# Patient Record
Sex: Male | Born: 1950
Health system: Southern US, Community
[De-identification: ages and names within clinical notes are randomized; demographics above are authoritative.]

## PROBLEM LIST (undated history)

## (undated) DIAGNOSIS — Z973 Presence of spectacles and contact lenses: Secondary | ICD-10-CM

## (undated) DIAGNOSIS — G8929 Other chronic pain: Secondary | ICD-10-CM

## (undated) DIAGNOSIS — H9193 Unspecified hearing loss, bilateral: Secondary | ICD-10-CM

## (undated) DIAGNOSIS — G4730 Sleep apnea, unspecified: Secondary | ICD-10-CM

## (undated) DIAGNOSIS — M545 Low back pain, unspecified: Secondary | ICD-10-CM

## (undated) DIAGNOSIS — I480 Paroxysmal atrial fibrillation: Secondary | ICD-10-CM

## (undated) DIAGNOSIS — I219 Acute myocardial infarction, unspecified: Secondary | ICD-10-CM

## (undated) DIAGNOSIS — T7840XA Allergy, unspecified, initial encounter: Secondary | ICD-10-CM

## (undated) DIAGNOSIS — T8859XA Other complications of anesthesia, initial encounter: Secondary | ICD-10-CM

## (undated) DIAGNOSIS — N400 Enlarged prostate without lower urinary tract symptoms: Secondary | ICD-10-CM

## (undated) DIAGNOSIS — H409 Unspecified glaucoma: Secondary | ICD-10-CM

## (undated) DIAGNOSIS — M1990 Unspecified osteoarthritis, unspecified site: Secondary | ICD-10-CM

## (undated) DIAGNOSIS — K649 Unspecified hemorrhoids: Secondary | ICD-10-CM

## (undated) DIAGNOSIS — Z8719 Personal history of other diseases of the digestive system: Secondary | ICD-10-CM

## (undated) DIAGNOSIS — Z87442 Personal history of urinary calculi: Secondary | ICD-10-CM

## (undated) DIAGNOSIS — C801 Malignant (primary) neoplasm, unspecified: Secondary | ICD-10-CM

## (undated) DIAGNOSIS — I10 Essential (primary) hypertension: Secondary | ICD-10-CM

## (undated) DIAGNOSIS — Z9889 Other specified postprocedural states: Secondary | ICD-10-CM

## (undated) DIAGNOSIS — K5190 Ulcerative colitis, unspecified, without complications: Secondary | ICD-10-CM

## (undated) DIAGNOSIS — T4145XA Adverse effect of unspecified anesthetic, initial encounter: Secondary | ICD-10-CM

## (undated) DIAGNOSIS — M549 Dorsalgia, unspecified: Secondary | ICD-10-CM

## (undated) DIAGNOSIS — F32A Depression, unspecified: Secondary | ICD-10-CM

## (undated) DIAGNOSIS — K635 Polyp of colon: Secondary | ICD-10-CM

## (undated) DIAGNOSIS — K5790 Diverticulosis of intestine, part unspecified, without perforation or abscess without bleeding: Secondary | ICD-10-CM

## (undated) DIAGNOSIS — Z974 Presence of external hearing-aid: Secondary | ICD-10-CM

## (undated) DIAGNOSIS — M961 Postlaminectomy syndrome, not elsewhere classified: Secondary | ICD-10-CM

## (undated) DIAGNOSIS — R112 Nausea with vomiting, unspecified: Secondary | ICD-10-CM

## (undated) DIAGNOSIS — F329 Major depressive disorder, single episode, unspecified: Secondary | ICD-10-CM

## (undated) DIAGNOSIS — I82409 Acute embolism and thrombosis of unspecified deep veins of unspecified lower extremity: Secondary | ICD-10-CM

## (undated) DIAGNOSIS — IMO0002 Reserved for concepts with insufficient information to code with codable children: Secondary | ICD-10-CM

## (undated) DIAGNOSIS — H269 Unspecified cataract: Secondary | ICD-10-CM

## (undated) DIAGNOSIS — E785 Hyperlipidemia, unspecified: Secondary | ICD-10-CM

## (undated) DIAGNOSIS — Z5189 Encounter for other specified aftercare: Secondary | ICD-10-CM

## (undated) HISTORY — PX: HERNIA REPAIR: SHX51

## (undated) HISTORY — PX: JOINT REPLACEMENT: SHX530

## (undated) HISTORY — DX: Malignant (primary) neoplasm, unspecified: C80.1

## (undated) HISTORY — DX: Postlaminectomy syndrome, not elsewhere classified: M96.1

## (undated) HISTORY — PX: EYE SURGERY: SHX253

## (undated) HISTORY — DX: Encounter for other specified aftercare: Z51.89

## (undated) HISTORY — DX: Hyperlipidemia, unspecified: E78.5

## (undated) HISTORY — DX: Reserved for concepts with insufficient information to code with codable children: IMO0002

## (undated) HISTORY — DX: Polyp of colon: K63.5

## (undated) HISTORY — DX: Allergy, unspecified, initial encounter: T78.40XA

## (undated) HISTORY — DX: Acute myocardial infarction, unspecified: I21.9

---
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
## 1991-08-04 HISTORY — PX: APPENDECTOMY: SHX54

## 1992-08-03 HISTORY — PX: CHOLECYSTECTOMY: SHX55

## 1994-08-03 DIAGNOSIS — I82409 Acute embolism and thrombosis of unspecified deep veins of unspecified lower extremity: Secondary | ICD-10-CM

## 1994-08-03 HISTORY — DX: Acute embolism and thrombosis of unspecified deep veins of unspecified lower extremity: I82.409

## 2000-12-04 ENCOUNTER — Encounter: Payer: Self-pay | Admitting: Emergency Medicine

## 2000-12-04 ENCOUNTER — Emergency Department (HOSPITAL_COMMUNITY): Admission: EM | Admit: 2000-12-04 | Discharge: 2000-12-04 | Payer: Self-pay | Admitting: Emergency Medicine

## 2001-02-02 ENCOUNTER — Encounter: Payer: Self-pay | Admitting: Orthopedic Surgery

## 2001-02-09 ENCOUNTER — Inpatient Hospital Stay (HOSPITAL_COMMUNITY): Admission: RE | Admit: 2001-02-09 | Discharge: 2001-02-12 | Payer: Self-pay | Admitting: Orthopedic Surgery

## 2001-03-30 ENCOUNTER — Observation Stay (HOSPITAL_COMMUNITY): Admission: RE | Admit: 2001-03-30 | Discharge: 2001-03-31 | Payer: Self-pay | Admitting: Orthopedic Surgery

## 2002-07-09 ENCOUNTER — Encounter: Payer: Self-pay | Admitting: Orthopedic Surgery

## 2002-07-10 ENCOUNTER — Inpatient Hospital Stay (HOSPITAL_COMMUNITY): Admission: EM | Admit: 2002-07-10 | Discharge: 2002-07-18 | Payer: Self-pay | Admitting: Emergency Medicine

## 2002-08-02 ENCOUNTER — Ambulatory Visit (HOSPITAL_COMMUNITY): Admission: RE | Admit: 2002-08-02 | Discharge: 2002-08-02 | Payer: Self-pay | Admitting: Orthopedic Surgery

## 2002-08-22 ENCOUNTER — Emergency Department (HOSPITAL_COMMUNITY): Admission: EM | Admit: 2002-08-22 | Discharge: 2002-08-22 | Payer: Self-pay | Admitting: Emergency Medicine

## 2002-12-26 ENCOUNTER — Encounter: Payer: Self-pay | Admitting: Gastroenterology

## 2002-12-26 ENCOUNTER — Ambulatory Visit (HOSPITAL_COMMUNITY): Admission: RE | Admit: 2002-12-26 | Discharge: 2002-12-26 | Payer: Self-pay | Admitting: Gastroenterology

## 2003-06-17 ENCOUNTER — Emergency Department (HOSPITAL_COMMUNITY): Admission: EM | Admit: 2003-06-17 | Discharge: 2003-06-17 | Payer: Self-pay | Admitting: Emergency Medicine

## 2003-12-28 ENCOUNTER — Encounter: Admission: RE | Admit: 2003-12-28 | Discharge: 2004-02-13 | Payer: Self-pay | Admitting: Family Medicine

## 2004-03-03 ENCOUNTER — Encounter: Admission: RE | Admit: 2004-03-03 | Discharge: 2004-03-03 | Payer: Self-pay | Admitting: Specialist

## 2004-05-01 ENCOUNTER — Ambulatory Visit (HOSPITAL_COMMUNITY): Admission: RE | Admit: 2004-05-01 | Discharge: 2004-05-02 | Payer: Self-pay | Admitting: Specialist

## 2005-07-15 ENCOUNTER — Ambulatory Visit: Payer: Self-pay | Admitting: Gastroenterology

## 2005-07-17 ENCOUNTER — Ambulatory Visit: Payer: Self-pay | Admitting: Gastroenterology

## 2005-07-17 ENCOUNTER — Ambulatory Visit (HOSPITAL_COMMUNITY): Admission: RE | Admit: 2005-07-17 | Discharge: 2005-07-17 | Payer: Self-pay | Admitting: Gastroenterology

## 2005-07-22 ENCOUNTER — Ambulatory Visit: Payer: Self-pay | Admitting: Gastroenterology

## 2005-08-05 ENCOUNTER — Ambulatory Visit: Payer: Self-pay | Admitting: Gastroenterology

## 2005-08-19 ENCOUNTER — Ambulatory Visit: Payer: Self-pay | Admitting: Gastroenterology

## 2005-08-24 ENCOUNTER — Ambulatory Visit (HOSPITAL_COMMUNITY): Admission: RE | Admit: 2005-08-24 | Discharge: 2005-08-24 | Payer: Self-pay | Admitting: Gastroenterology

## 2005-09-03 ENCOUNTER — Ambulatory Visit: Payer: Self-pay | Admitting: Cardiology

## 2005-09-10 ENCOUNTER — Ambulatory Visit: Payer: Self-pay

## 2005-09-18 ENCOUNTER — Encounter: Admission: RE | Admit: 2005-09-18 | Discharge: 2005-09-18 | Payer: Self-pay | Admitting: Surgery

## 2005-09-29 ENCOUNTER — Encounter: Admission: RE | Admit: 2005-09-29 | Discharge: 2005-12-28 | Payer: Self-pay | Admitting: Surgery

## 2005-10-19 ENCOUNTER — Encounter: Admission: RE | Admit: 2005-10-19 | Discharge: 2005-10-19 | Payer: Self-pay | Admitting: Surgery

## 2005-11-30 ENCOUNTER — Other Ambulatory Visit: Admission: RE | Admit: 2005-11-30 | Discharge: 2005-11-30 | Payer: Self-pay | Admitting: Otolaryngology

## 2005-12-08 ENCOUNTER — Ambulatory Visit (HOSPITAL_COMMUNITY): Admission: RE | Admit: 2005-12-08 | Discharge: 2005-12-09 | Payer: Self-pay | Admitting: Surgery

## 2005-12-08 ENCOUNTER — Encounter (INDEPENDENT_AMBULATORY_CARE_PROVIDER_SITE_OTHER): Payer: Self-pay | Admitting: *Deleted

## 2006-01-20 ENCOUNTER — Encounter (INDEPENDENT_AMBULATORY_CARE_PROVIDER_SITE_OTHER): Payer: Self-pay | Admitting: *Deleted

## 2006-01-20 ENCOUNTER — Ambulatory Visit (HOSPITAL_COMMUNITY): Admission: RE | Admit: 2006-01-20 | Discharge: 2006-01-20 | Payer: Self-pay | Admitting: Otolaryngology

## 2006-08-03 HISTORY — PX: BACK SURGERY: SHX140

## 2007-03-14 ENCOUNTER — Ambulatory Visit: Payer: Self-pay | Admitting: Gastroenterology

## 2007-03-25 ENCOUNTER — Ambulatory Visit: Payer: Self-pay | Admitting: Gastroenterology

## 2007-08-04 HISTORY — PX: SPINE SURGERY: SHX786

## 2007-09-29 ENCOUNTER — Ambulatory Visit (HOSPITAL_COMMUNITY)
Admission: RE | Admit: 2007-09-29 | Discharge: 2007-09-29 | Payer: Self-pay | Admitting: Physical Medicine and Rehabilitation

## 2007-12-23 ENCOUNTER — Ambulatory Visit: Payer: Self-pay | Admitting: Gastroenterology

## 2007-12-23 DIAGNOSIS — R1013 Epigastric pain: Secondary | ICD-10-CM | POA: Insufficient documentation

## 2007-12-23 DIAGNOSIS — R1031 Right lower quadrant pain: Secondary | ICD-10-CM | POA: Insufficient documentation

## 2007-12-23 DIAGNOSIS — Z860100 Personal history of colon polyps, unspecified: Secondary | ICD-10-CM | POA: Insufficient documentation

## 2007-12-23 DIAGNOSIS — K219 Gastro-esophageal reflux disease without esophagitis: Secondary | ICD-10-CM

## 2007-12-27 ENCOUNTER — Ambulatory Visit: Payer: Self-pay | Admitting: Gastroenterology

## 2007-12-27 LAB — CONVERTED CEMR LAB
Albumin: 4.3 g/dL (ref 3.5–5.2)
BUN: 16 mg/dL (ref 6–23)
Basophils Absolute: 0 10*3/uL (ref 0.0–0.1)
Basophils Relative: 0.5 % (ref 0.0–1.0)
CO2: 29 meq/L (ref 19–32)
Calcium: 9.3 mg/dL (ref 8.4–10.5)
Chloride: 104 meq/L (ref 96–112)
Creatinine, Ser: 1.1 mg/dL (ref 0.4–1.5)
GFR calc Af Amer: 88 mL/min
GFR calc non Af Amer: 73 mL/min
Glucose, Bld: 100 mg/dL — ABNORMAL HIGH (ref 70–99)
HCT: 46.8 % (ref 39.0–52.0)
Lymphocytes Relative: 23.4 % (ref 12.0–46.0)
MCHC: 34.6 g/dL (ref 30.0–36.0)
MCV: 92.6 fL (ref 78.0–100.0)
Monocytes Absolute: 0.5 10*3/uL (ref 0.1–1.0)
Monocytes Relative: 7.8 % (ref 3.0–12.0)
Neutrophils Relative %: 63.5 % (ref 43.0–77.0)
Platelets: 184 10*3/uL (ref 150–400)

## 2007-12-28 ENCOUNTER — Ambulatory Visit: Payer: Self-pay | Admitting: Cardiology

## 2007-12-28 LAB — CONVERTED CEMR LAB: Tissue Transglutaminase Ab, IgA: 0.3 units (ref <7)

## 2008-02-07 ENCOUNTER — Telehealth (INDEPENDENT_AMBULATORY_CARE_PROVIDER_SITE_OTHER): Payer: Self-pay | Admitting: *Deleted

## 2008-02-24 ENCOUNTER — Encounter: Payer: Self-pay | Admitting: Pulmonary Disease

## 2009-03-15 ENCOUNTER — Encounter: Admission: RE | Admit: 2009-03-15 | Discharge: 2009-03-15 | Payer: Self-pay | Admitting: Otolaryngology

## 2009-04-27 ENCOUNTER — Encounter: Admission: RE | Admit: 2009-04-27 | Discharge: 2009-04-27 | Payer: Self-pay | Admitting: Orthopedic Surgery

## 2009-06-06 ENCOUNTER — Inpatient Hospital Stay (HOSPITAL_COMMUNITY): Admission: RE | Admit: 2009-06-06 | Discharge: 2009-06-07 | Payer: Self-pay | Admitting: Orthopedic Surgery

## 2009-09-10 ENCOUNTER — Encounter: Admission: RE | Admit: 2009-09-10 | Discharge: 2009-09-10 | Payer: Self-pay | Admitting: Orthopedic Surgery

## 2009-10-04 ENCOUNTER — Encounter: Admission: RE | Admit: 2009-10-04 | Discharge: 2009-10-04 | Payer: Self-pay | Admitting: Family Medicine

## 2009-12-17 ENCOUNTER — Encounter: Admission: RE | Admit: 2009-12-17 | Discharge: 2009-12-17 | Payer: Self-pay | Admitting: Diagnostic Neuroimaging

## 2010-08-03 DIAGNOSIS — E785 Hyperlipidemia, unspecified: Secondary | ICD-10-CM

## 2010-08-03 HISTORY — DX: Hyperlipidemia, unspecified: E78.5

## 2010-08-24 ENCOUNTER — Encounter: Payer: Self-pay | Admitting: Family Medicine

## 2010-11-05 LAB — BASIC METABOLIC PANEL
BUN: 13 mg/dL (ref 6–23)
Calcium: 9.2 mg/dL (ref 8.4–10.5)
GFR calc non Af Amer: >60 mL/min (ref >60)
Glucose, Bld: 94 mg/dL (ref 70–99)
Potassium: 4 mEq/L (ref 3.5–5.1)
Sodium: 140 mEq/L (ref 135–145)

## 2010-11-05 LAB — CBC
Hemoglobin: 15.8 g/dL (ref 13.0–17.0)
Platelets: 171 10*3/uL (ref 150–400)
RDW: 12.7 % (ref 11.5–15.5)
WBC: 6.3 10*3/uL (ref 4.0–10.5)

## 2010-12-19 NOTE — Op Note (Signed)
[facility]  Patient:    [name], [name]                         MRN: [id number] Proc. Date: 02/09/01 Adm. Date:  [id number] Disc. Date: [id number] Attending:  Wynona Meals K                           Operative Report  PREOPERATIVE DIAGNOSIS:  Osteoarthritis of the left knee.  POSTOPERATIVE DIAGNOSIS:  Osteoarthritis of the left knee.  PROCEDURE:  Left total knee arthroplasty.  SURGEON:  Gaynelle Arabian, M.D.  ASSISTANT:  Jenetta Loges, P.A.  ANESTHESIA:  Spinal.  ESTIMATED BLOOD LOSS:  Minimal.  DRAINS:  Hemovac x 1.  COMPLICATIONS:  None.  TOURNIQUET TIME:  54 minutes at 350 mmHg.  CONDITION:  Stable to recovery.  BRIEF CLINICAL NOTE:  [name] is a 60 year old male with severe osteoarthritis of the left knee with pain refractory to nonoperative management including injections. He had a previous right total knee arthroplasty approximately five to six years ago by Dr. Collie Siad with good results. He presents now for left total knee arthroplasty.  DESCRIPTION OF PROCEDURE:  After successful administration of spinal anesthetic, a tourniquet was placed high on the left thigh and left lower extremity prepped and draped in the usual sterile fashion. The extremity was marked in Esmarch, knee flexed, and tourniquet inflated to 350 mmHg. A standard midline incision was made and skin cut with a 10 blade through subcutaneous tissue to the level of the extensor mechanism. A fresh blade was used to make a medial parapatellar arthrotomy. The soft tissue over the proximal and medial tibia subperiosteally elevated to the joint line with a knife into the semimembranosus bursa with a curved osteotome. The soft tissue over the proximal lateral tibia was also elevated with attention being paid to avoid the patellar tendon on tibial tubercle. The lateral patellar femoral ligaments cut, patella everted, knee flexed to 90 degrees. The ACL and PCL were removed. A drill was  used to create a starting hole in the distal femur and then the canal was irrigated. Five degree left valgus alignment guide was placed and referencing off the posterior condyles rotations marked in a block pin so as to remove 9 mm off the distal femur. Distal femoral resection was made with an oscillating saw. A sizing block is placed and a size 5 is most appropriate. Rotation corresponds with the epicondylar axis. The size 5 cutting block is placed and the anterior and posterior cuts then made.  The tibia is subluxed forward and the extramedullary tibial alignment guide placed. Referencing proximally at the medial third tibial tubercle and distally along the second metatarsal axis of the tibial crest, this block is pinned so as to remove 10 mm of the nondeficient lateral side. ______ resection is then made with an oscillating saw. A size 5 is the most appropriate tibia. Tibial preparation is then performed with the keel punch and modular drill.  The chamfer block is then placed on the femur and a condylar chamfer cut is made. A size 5 posterior stabilized femur is placed. A 10 mm posterior stabilized rotating platform insert is placed and the knee is then put into full extension with excellent varus valgus balance down past 100 degrees of flexion. The patella was then everted, thickness measured to be 26.5 mm and free hand resection was taken down to approximately 15 mm. The template is  placed and the lug holes are drilled for a 41 mm patella. The patella is placed and it tracks normally.  The tibial and patellar trials are removed. A laminar spreader was placed to give access to the posterior aspect of the joint and then the posterior osteophytes are removed off the femur with a trial in place. The trial is then removed. Cut bone surfaces are repaired with pulsatile lavage and the cement mixed. Once ready for implantation, the size 5 posterior stabilized femur, rotating platform  tibial tray, and the 41 mm patella cemented into place and the patella held with a clamp. A 10 mm trial insert is placed, knee held in full extension, and all extruded cement removed. Once the cement was fully hardened and the trial insertion moved then the permanent 10 mm posterior stabilized rotating platform insert is placed into the tibial tray. The wound is then copiously irrigated and the extensor mechanism closed over a Hemovac drain with interrupted #1 PDS. The tourniquet is released with a total time of 54 minutes. The subcu is closed with interrupted 2-0 Vicryl, subcuticular with running 4-0 monocryl. The incision was cleaned and dried and Steri-Strips and a bulky sterile dressing applied. The patient was then awakened and transported to recovery in stable condition. DD:  02/09/01 TD:  02/09/01 Job: [id number]

## 2010-12-19 NOTE — Op Note (Signed)
NAME:  [name], [name] NO.:  [id number]   MEDICAL RECORD NO.:  [id number]          PATIENT TYPE:  OIB   LOCATION:  40                         FACILITY:  [facility]   PHYSICIAN:  Isabel Caprice. Hassell Done, MD  DATE OF BIRTH:  1950/10/19   DATE OF PROCEDURE:  12/08/2005  DATE OF DISCHARGE:                                 OPERATIVE REPORT   CCS NUMBER:  [id number].   PREOPERATIVE DIAGNOSES:  1.  Right upper quadrant pain,  2.  Biliary dyskinesia.   POSTOPERATIVE DIAGNOSES:  1.  Right upper quadrant pain,  2.  Biliary dyskinesia.   PREOPERATIVE INDICATIONS:  [name] is a 60 year old white male whom I  had done a laparoscopic Nissen in 1995 which radiographically is intact and  doing well.  The patient has been having some upper abdominal pain and was  found to have perhaps evidence of biliary dyskinesia and was referred for  cholecystectomy.  Prior to this, we did have him lose about 25 pounds which  he has done.  Since the last time I saw [name], he has developed a  palpable mass in his left neck which has been worked by Melissa Montane, and  biopsy suggests a pleomorphic adenoma, and further workup will be done.  In  the meantime, because of his biliary dyskinesia, he will have a laparoscopic  cholecystectomy today.   SURGEON:  Dr. [name]   ASSISTANT:  Dr. Fanny Skates   ANESTHESIA:  General endotracheal.   DESCRIPTION OF PROCEDURE:  The patient was taken to room 1 on Dec 08, 2005,  and given general anesthesia.  The abdomen was prepped with chlorhexidine  and draped sterilely.  A longitudinal incision was made down into the  umbilicus, and I entered the previous lap Nissen portal without difficulty.  The abdomen was insufflated.  Three trocars were placed in the upper abdomen  after infiltrating the areas with Marcaine.  The gallbladder was elevated  and was seen to be adherent to surrounding fatty tissue which would indicate  some evidence of chronic  cholecystitis.  This was stripped away, and then  the cystic duct and cystic artery were dissected free.  Once these were  skeletonized and the anatomy delineated, I put a clip upon the gallbladder  side and clips on the cystic artery, incised the cystic duct, and put in a  Reddick catheter through which I did a dynamic cholangiogram which showed  prompt filling of a normal-appearing common bile duct with intrahepatic  filling and then with free flow into the duodenum.  The cystic duct was then  triple clipped, divided, and the cystic artery was divided, and then the  gallbladder was removed from the gallbladder bed with a hook electrocautery  without entering the gallbladder and controlling bleeding up in the fundus  near in the midway up in the gallbladder where there was a perforating  vessel.  This was double clipped as I removed the gallbladder.  No bleeding  or bile leaks were seen.  The gallbladder was placed in a bag and brought  out through the  umbilicus.  Reinspected the gallbladder bed, and no bleeding  was noted.  No bile leaks were noted.  The umbilical defect was repaired  with 2 sutures of 0 Vicryl placed in a simple fashion.  This was done under  laparoscopic vision.  I then deflated the abdomen, infiltrated the remaining  wounds with Marcaine, and closed then with 4-0 Vicryl, Benzoin, and Steri-  Strips.  The patient seemed to tolerate the procedure well.  He was taken to  the recovery room in satisfactory condition.   In looking around the abdomen, I looked at his liver; I looked at his  stomach, looked through the small bowel, did not see any evidence of any  occult masses or anything that would suggest foregut tumor that had  metastasized to the neck.  The patient will get back with Dr. Janace Hoard for  completion workup of the head and neck mass.      Isabel Caprice Hassell Done, MD  Electronically Signed     MBM/MEDQ  D:  12/08/2005  T:  12/08/2005  Job:  [id number]   cc:    Lennette Bihari L. Little, M.D.  Fax: [phone]   Clarene Reamer, M.D. [facility]  [address]  [place]  [place] [address]   Melissa Montane, M.D.  Fax: [phone]

## 2010-12-19 NOTE — H&P (Signed)
[facility]  Patient:    [name], [name]                         MRN: [id number] Adm. Date:  02/09/01 Attending:  Gaynelle Arabian, M.D. Dictator:   Alexzandrew L. Perkins, P.A.-C.                         History and Physical  DATE OF OFFICE VISIT AND HISTORY AND PHYSICAL:  January 27, 2001  CHIEF COMPLAINT:  Left knee pain.  HISTORY OF PRESENT ILLNESS:  The patient is a 60 year old white male who has previously undergone right total knee replacement in the past and has done quite well. However, he presents for a reevaluation of his left knee pain. Left knee pain has been going on for some time now. He was seen back by Mikeal Hawthorne A. Doran Durand., M.D. back in 1997 and has been followed and treated in the past conservatively for his left knee. He has been on different medications and even anti-inflammatories to include Motrin. However, this did upset his stomach. As stated before, he has undergone right total knee replacement and arthroplasty and has done quite well. The left knee pain has progressed to where it is interfering with his daily activities and felt he would benefit for a total knee replacement. Risks and benefits discussed, and he has elected to proceed with surgery.  ALLERGIES:  No known drug allergies.  CURRENT MEDICATIONS: 1. Zyrtec one a day. 2. Multivitamin daily.  PAST MEDICAL HISTORY:  Carpal tunnel syndrome.  PAST SURGICAL HISTORY:  Nissen fundoplication and also right total knee and appendectomy.  SOCIAL HISTORY:  He is married. Has one child. Three to four beers a night of alcohol intake. Nonsmoker.  FAMILY HISTORY:  Mother history of PE and coronary artery disease. Father deceased with history of prostate cancer.  REVIEW OF SYSTEMS:  GENERAL:  No fevers, chills, or night sweats. NEUROLOGICAL:  No seizures, syncope, or paralysis. RESPIRATORY:  No shortness of breath, productive cough, or hemoptysis. CARDIOVASCULAR:  No chest  pain, angina, or orthopnea. GI:  No nausea, vomiting, diarrhea, or constipation. No blood or mucus in the stool. GU:  No discharge, dysuria, or hematuria. MUSCULOSKELETAL: Pertinent to that found in the history of present illness.  PHYSICAL EXAMINATION:  GENERAL:  The patient is a 60 year old, white male, well-nourished, well-developed appears to be in no acute distress. Alert, oriented, and cooperative.  VITAL SIGNS:  Blood pressure 118/72, pulse is 64, respirations 16.  HEENT:  Normocephalic, atraumatic. Pupils are round and reactive. EOMs are intact.  NECK:  Supple. No lymphadenopathy or thyromegaly appreciated.  CHEST:  No wheezing, rhonchi, or rales. Chest is clear anterior/posterior.  HEART:  Regular rate and rhythm. No murmurs. S1, S2 noted.  ABDOMEN:  Soft, nontender. No rebound or guarding.  GENITALIA/RECTAL/BREASTS:  Not done. Not pertinent to present illness.  EXTREMITIES:  Significant to the left lower extremity. He has range of motion of the left knee from 15 to 90 degrees and is swollen, tender over the medial and lateral joint line. Neurovascularly intact.  LABORATORY DATA:  X-rays show end-stage osteoarthritis of the left knee with tricompartment arthritis.  IMPRESSION:  End-stage osteoarthritis left knee.  PLAN:  The patient will be admitted to [facility] to undergo a left total knee replacement and arthroplasty. Surgery will be performed by Gaynelle Arabian, M.D. DD:  01/28/01 TD:  01/28/01 Job: [id number]

## 2010-12-19 NOTE — Op Note (Signed)
NAME:  [name], [name] NO.:  [id number]   MEDICAL RECORD NO.:  [id number]          PATIENT TYPE:  OIB   LOCATION:  5038                         FACILITY:  [facility]   PHYSICIAN:  Jessy Oto, M.D.   DATE OF BIRTH:  17-Feb-1951   DATE OF PROCEDURE:  05/01/2004  DATE OF DISCHARGE:                                 OPERATIVE REPORT   PREOPERATIVE DIAGNOSIS:  Extraforaminal lateral herniated nucleus pulposus,  left L1-2, with calcified spur.   POSTOPERATIVE DIAGNOSIS:  Calcified herniated nucleus pulposus, left L1-2,  extraforaminal, lateral to the foramen and L1 nerve root.   PROCEDURES:  1.  Left L1-2 extraforaminal approach to microdiskectomy of the lateral      herniated nucleus pulposus.  2.  Decompression of the left L1 nerve root.   SURGEON:  Jessy Oto, M.D.   ASSISTANT:  Epimenio Foot, P.A.   ANESTHESIA:  GOT, Crissie Sickles. Conrad Bemidji, M.D.   ESTIMATED BLOOD LOSS:  50 mL.   DRAINS:  None.   BRIEF CLINICAL HISTORY:  A 60 year old male presented with severe back pain  and radiation to the left leg.  The pain has been unrelenting over the last  two to three months.  He has undergone attempts at selective nerve root  blocks over the left side at L2-3 and L3-4, unsuccessful in relieving his  pain.  Also found to have disk protrusion lateral to L1-2 level here.  Finally a selective block was successful in relieving his pain and  discomfort.  He was brought to the operating room to undergo excision of the  lateral disk protrusion, left-sided L1-2, that is an extraforaminal region  lateral to the foramen, affecting primarily the L1 nerve root.  The  patient's relief of pain was only one to two hours following the selective  root block, suggesting that only local anesthesia was of much benefit here.   INTRAOPERATIVE FINDINGS:  Calcified spur and disk off the lateral aspect of  the posterior portion of the L1-2 disk space, affecting the left L1 nerve  root,  causing nerve root compression along the anterior ventral aspect of  the L1 nerve root.   DESCRIPTION OF PROCEDURE:  After adequate general anesthesia, the patient in  the prone position using the Wilson frame.  He has bilateral total knee  arthroplasties so that an [place] frame was not used, a Tree surgeon table was  used.  Standard prep with Duraprep solution after padding all the pressure  points well.  Note that a preoperative consent form was signed after  informed consent and the side on which the surgery was to be performed was  marked preoperatively.  Standard prep with Duraprep solution, draped in the  usual manner, iodine Vi-Drape used.  Prior to placement of the iodine Vi-  Drape, spinal needles placed at the expected L1 and L2 levels.  These were  found to be present at the L2 and the L3 levels so that incision was made an  inch and a half to the left side of the midline, sagittally oriented,  through the skin and subcutaneous  layers a total of 3-1/2 inches in length  through the skin and subcu layers, down to the lumbar dorsal fascia  overlying the paralumbar muscles on the left side.  The incision then made  through the superficial fascia after infiltration with Marcaine 0.5% and  1:200,000 epinephrine.  Blunt dissection then through the muscle fibers to  the transverse process, and this was able to be palpated at the L2, L1  levels.  A McCullough retractor inserted and then a Penfield 4 used to  carefully penetrate the intertransverse membrane and a second intraoperative  lateral radiograph obtained, demonstrating the Penfield 4 to be over the  transverse process of L2, pointing toward the L1-2 disk.  The second  McCullough retractor was inserted, obtaining further exposure at the region  of the left-side paralumbar region and the intertransverse region.  The  intertransverse ligament was resected off the superior aspect of the  transverse process of L2 and the intertransverse  muscle carefully retracted  laterally until the L1 nerve root identified.  The operating room microscope  draped, sterilely brought into the field, and under direct visualization  then the L1 nerve roots carefully freed off the undersurface, where it was  overlying the spur and calcified disk over the posterolateral aspect of the  left L1-2 disk space.  D'Errico retractor then used to carefully retract the  L1 nerve root medially so that the spur and disk material laterally was  easily identified.  A 15 blade scalpel then used to incise the disk over the  posterolateral corner, pituitaries were used to excise the disk material.  A  large spur remaining over the posterior superior aspect of the L2 vertebral  body over the posterolateral corner of the disk space.  This was excised  using an osteophyte-cutting rongeur.  After resecting then this posterior  corner, intraoperative radiographs obtained demonstrating the Penfield 4  within the disk at the L1-2 level and in the region of the expected spur and  disk material that was excised.  Following this a hockey stick neurologic  probe could be passed ventral and anterior to the L1 nerve root within the  neural foramen, demonstrating its freedom, as well as posterior to the nerve  root.  With this, then, irrigation was performed.  Thrombin-soaked Gelfoam  used to obtain hemostasis. Once this was completed, the Gelfoam was removed.  No significant electrocautery was carried out deep to the intertransverse  membrane during this period of time.  Following irrigation, then the soft  tissue was allowed to fall back into place, including the paralumbar  muscles.  The superficial fascial layer then approximated with interrupted 0  Vicryl sutures.  The more superficial layers with interrupted 0 and then 2-0  Vicryl suture, and the skin closed with a running subcu stitch of 4-0  Vicryl.  Tincture of Benzoin and Steri-Strips applied, a Coverlet  dressing applied.  The patient then reactivated following return to the supine  position, extubated, and returned to the recovery room in satisfactory  condition.  All instrument and sponge counts were correct.       JEN/MEDQ  D:  05/01/2004  T:  05/02/2004  Job:  [id number]

## 2010-12-19 NOTE — Discharge Summary (Signed)
[facility]  Patient:    [name], [name]                       MRN: [id number] Adm. Date:  [id number] Disc. Date: 02/12/01 Attending:  [name] Dictator:   Elodia Florence. Clabe Seal, P.A.                           Discharge Summary  ADMISSION DIAGNOSIS:  End-stage osteoarthritis of left knee.  DISCHARGE DIAGNOSES: 1. End-stage osteoarthritis of left knee. 2. Mild postoperative anemia.  OPERATIONS:  On February 09, 2001, the patient underwent left total knee replacement arthroplasty.  Jenetta Loges, P.A., assisted.  CONSULTS:  None.  BRIEF HISTORY:  This is a 60 year old male with severe osteoarthritis of both knees.  He had previously undergone a right total knee arthroplasty about five or six years ago and has done very good with those results.  His pain now in the left knee is refractory to non-operative management, including injections to the knee.  He is a very active, relatively young gentleman and it was felt that he would benefit from surgical intervention.  He was admitted for the above procedure.  COURSE IN THE HOSPITAL:  The patient tolerated the surgical procedure quite well and entered into the total knee protocol with minimal to no difficulty. He progressed very nicely with the CPM machine, as well as the general protocol.  Neurovascular remained intact to the left lower extremity.  He was ambulating in the hall with minimal difficulty.  His previous total knee certainly helped him with his postoperative course concerning his left knee.  He was voiding.  The wound was dry.  He had achieved 60+ degrees on CPM.  He felt that he could do at home much what he was doing in the hospital, so it was decided that we could discharge him home to resume home health physical therapy.  He is to return to see Gaynelle Arabian, M.D., on March 01, 2001.  LABORATORY VALUES IN THE HOSPITAL:  Hematology showed a CBC with differential essentially within  normal limits.  Blood chemistry was also normal.  He did have a very mild postoperative anemia.  The chest x-ray showed no active disease.  The electrocardiogram showed a normal sinus rhythm.  CONDITION ON DISCHARGE:  Improved and stable.  PLAN:  The patient was discharged to his home.  He is to continue with home health by [place].  To continue with Coumadin protocol for about two weeks. He will return to see Gaynelle Arabian, M.D., on March 01, 2001.  DISCHARGE MEDICATIONS: 1. Coumadin per pharmacy. 2. Tylox, #50, one to two q.4-6h. p.r.n. pain. 3. Robaxin 500 mg, #40 with a refill, one q.6h. p.r.n. muscle spasm. 4. Use Tylenol p.r.n. 5. He is to continue with any home medications. 6. Continue with incentive spirometer at home.  DIET:  He is to continue with home diet. DD:  02/12/01 TD:  02/12/01 Job: [id number]

## 2010-12-19 NOTE — Discharge Summary (Signed)
   NAME:  [name], [name] NO.:  [id number]   MEDICAL RECORD NO.:  [id number]                   PATIENT TYPE:  INP   LOCATION:  1025                                 FACILITY:  [facility]   PHYSICIAN:  Harriette Bouillon, M.D.              DATE OF BIRTH:  02/11/1951   DATE OF ADMISSION:  07/09/2002  DATE OF DISCHARGE:  07/18/2002                                 DISCHARGE SUMMARY   DISCHARGE DIAGNOSES:  1. Deep vein thrombosis of the right lower extremity involving just above     the knee downward.  2. Status post bilateral knee replacement.  3. Non-specified injury to the right lower extremity.   HISTORY OF PRESENT ILLNESS AND HOSPITAL COURSE:  The patient was admitted  with right leg pain and swelling with orthopaedic consultation on admission  initially because he was unable to bear weight.  Initial evaluation with a  Doppler ultrasound did show a deep vein thrombosis.  The patient had been  started on heparin, and initially started on Coumadin therapy.  He would  continue to receive supportive care, including pain medications as well as  topical heat application to his leg.  He gradually improved over the course  of the week.  Unfortunately, it took a week's time for his INR to become to  an acceptable level so that he could be discharged to home.  There were no  significant complications during hospitalization.  He had no evidence of  pulmonary embolism.   DISCHARGE MEDICATIONS:  1. Coumadin 12.5 mg p.o. q.d.  2. Darvocet-N 100 one or two q.4-6h. p.r.n.   CONDITION ON DISCHARGE:  He was discharged in improve condition with no  significant swelling or erythema in his leg.  He still had some mild pain.   FOLLOWUP:  He is to follow up with Dr. Leonie Douglas one week after discharge.                                               Harriette Bouillon, M.D.    RM/MEDQ  D:  07/21/2002  T:  07/22/2002  Job:  [id number]

## 2010-12-19 NOTE — Op Note (Signed)
[facility]  Patient:    BURRIS, MATHERNE Visit Number: [id number] MRN: [id number]          Service Type: SUR Location: 4W [id number] 01 Attending Physician:  [name] Proc. Date: 03/30/01 Adm. Date:  03/30/2001                             Operative Report  PREOPERATIVE DIAGNOSIS:  Arthrofibrosis, left total knee.  POSTOPERATIVE DIAGNOSIS:  Arthrofibrosis, left total knee.  PROCEDURE:  Closed manipulation, left knee.  SURGEON:  Gaynelle Arabian, M.D.  ASSISTANT:  None.  ANESTHESIA:  General.  COMPLICATIONS:  None.  CONDITION:  Stable to recovery.  BRIEF CLINICAL NOTE:  Emily is a 60 year old male who had a left total knee done approximately seven weeks ago. He has developed arthrofibrosis, range of motion only 10-80. He presents now for closed manipulation.  DESCRIPTION OF PROCEDURE:  After successful administration of general anesthetic, the patients range of motion is tested and is 10-80. A flexion force is applied with my chest on his proximal tibia. I was able to audibly hear the adhesions lyse and achieve flexion of 120 degrees. I then put an extension force on and I was able to get within 5 degrees of full extension. Against gravity he was flexed to 115 and then with the extra push 120. At this point, he was awakened and transported to recovery in stable condition. Attending Physician:  [name] DD:  03/30/01 TD:  03/30/01 Job: [id number]

## 2010-12-19 NOTE — Op Note (Signed)
NAME:  [name], BUECHE                ACCOUNT NO.:  [id number]   MEDICAL RECORD NO.:  [id number]          PATIENT TYPE:  AMB   LOCATION:  SDS                          FACILITY:  [facility]   PHYSICIAN:  Melissa Montane, M.D.       DATE OF BIRTH:  February 19, 1951   DATE OF PROCEDURE:  01/20/2006  DATE OF DISCHARGE:                                 OPERATIVE REPORT   PREOPERATIVE DIAGNOSES:  Left neck mass.   POSTOPERATIVE DIAGNOSIS:  Left neck mass.   SURGICAL PROCEDURES:  Excision of left neck mass.   ANESTHESIA:  General.   ESTIMATED BLOOD LOSS:  Less than 5 mL.   INDICATIONS:  This is a 60 year old who has had a mass in his lower neck  that has been stable.  It had a fine-needle aspiration performed which was  consistent with a pleomorphic adenoma.  This is certainly inconsistent with  suspected pathology.  The CT scan did not show any obvious masses in his  parotid gland.  He now needs to have an excision for a definitive diagnosis.  He was informed of the risk and benefits of this procedure including  bleeding, infection, scarring, numbness of the skin, weakness of the  shoulder from spinal accessory nerve injury, and risk of the anesthetic.  All questions were answered and consent was obtained.   OPERATION:  The patient was taken to the operating room and placed in the  supine position after adequate general endotracheal tube anesthesia.  He was  placed in the right gaze position, and prepped and draped in usual sterile  manner.  An incision was made over this mass in a skin crease.  A 15-blade  was used to open the skin.  Dissection was carried down through the platysma  muscle.  The mass was immediately encountered.  The capsule was carefully  dissected staying on top of the mass and it was removed without difficulty.  The mass was sent as a permanent, fresh specimen.  The wound was irrigated  with saline.  The wound was closed with interrupted 4-0 chromic and a  running 5-0 nylon.  The  patient was awakened and brought to recovery in  stable condition.  Counts were correct.           ______________________________  Melissa Montane, M.D.     JB/MEDQ  D:  01/20/2006  T:  01/20/2006  Job:  [id number]   cc:   Lennette Bihari L. Little, M.D.  Fax: [phone]

## 2011-09-25 ENCOUNTER — Other Ambulatory Visit: Payer: Self-pay | Admitting: Urology

## 2011-10-05 ENCOUNTER — Encounter (HOSPITAL_COMMUNITY): Payer: Self-pay | Admitting: Pharmacy Technician

## 2011-10-06 ENCOUNTER — Encounter (HOSPITAL_COMMUNITY): Payer: Self-pay

## 2011-10-06 ENCOUNTER — Encounter (HOSPITAL_COMMUNITY)
Admission: RE | Admit: 2011-10-06 | Discharge: 2011-10-06 | Disposition: A | Discharge status: Home / Self Care | Payer: Medicare Other | Source: Ambulatory Visit | Attending: Urology | Admitting: Urology

## 2011-10-06 HISTORY — DX: Dorsalgia, unspecified: M54.9

## 2011-10-06 HISTORY — DX: Unspecified osteoarthritis, unspecified site: M19.90

## 2011-10-06 HISTORY — DX: Benign prostatic hyperplasia without lower urinary tract symptoms: N40.0

## 2011-10-06 HISTORY — DX: Sleep apnea, unspecified: G47.30

## 2011-10-06 HISTORY — DX: Other complications of anesthesia, initial encounter: T88.59XA

## 2011-10-06 HISTORY — DX: Other chronic pain: G89.29

## 2011-10-06 HISTORY — DX: Acute embolism and thrombosis of unspecified deep veins of unspecified lower extremity: I82.409

## 2011-10-06 HISTORY — DX: Adverse effect of unspecified anesthetic, initial encounter: T41.45XA

## 2011-10-06 LAB — BASIC METABOLIC PANEL
GFR calc Af Amer: >90 mL/min (ref >90)
GFR calc non Af Amer: 89 mL/min — ABNORMAL LOW (ref >90)
Potassium: 4 mEq/L (ref 3.5–5.1)
Sodium: 138 mEq/L (ref 135–145)

## 2011-10-06 LAB — SURGICAL PCR SCREEN
MRSA, PCR: NEGATIVE
Staphylococcus aureus: NEGATIVE

## 2011-10-06 LAB — CBC
Hemoglobin: 15 g/dL (ref 13.0–17.0)
MCHC: 35.7 g/dL (ref 30.0–36.0)
RDW: 12.7 % (ref 11.5–15.5)
WBC: 5.4 10*3/uL (ref 4.0–10.5)

## 2011-10-06 NOTE — Patient Instructions (Signed)
YOUR SURGERY IS SCHEDULED ON:   Friday 3/8  AT  9:05 AM  REPORT TO [facility] AT:  7:00 AM      PHONE # FOR SHORT STAY IS [phone]  DO NOT EAT OR DRINK ANYTHING AFTER MIDNIGHT THE NIGHT BEFORE YOUR SURGERY.  YOU MAY BRUSH YOUR TEETH, RINSE OUT YOUR MOUTH--BUT NO WATER, NO FOOD, NO CHEWING GUM, NO MINTS, NO CANDIES, NO CHEWING TOBACCO.  PLEASE TAKE THE FOLLOWING MEDICATIONS THE AM OF YOUR SURGERY WITH A FEW SIPS OF WATER:    ZYRTEC, ZOLOFT, TAMSULOSIN.    MAY WEAR FENTANYL PATCH  AND USE FLONASE    IF YOU USE INHALERS--USE YOUR INHALERS THE AM OF YOUR SURGERY AND BRING INHALERS TO [place].    IF YOU ARE DIABETIC:  DO NOT TAKE ANY DIABETIC MEDICATIONS THE AM OF YOUR SURGERY.  IF YOU TAKE INSULIN IN THE EVENINGS--PLEASE ONLY TAKE 1/2 NORMAL EVENING DOSE THE NIGHT BEFORE YOUR SURGERY.  NO INSULIN THE AM OF YOUR SURGERY.  IF YOU HAVE SLEEP APNEA AND USE CPAP OR BIPAP--PLEASE BRING THE MASK --NOT THE MACHINE-NOT THE TUBING   -JUST THE MASK. DO NOT BRING VALUABLES, MONEY, CREDIT CARDS.  CONTACT LENS, DENTURES / PARTIALS, GLASSES SHOULD NOT BE WORN TO SURGERY AND IN MOST CASES-HEARING AIDS WILL NEED TO BE REMOVED.  BRING YOUR GLASSES CASE, ANY EQUIPMENT NEEDED FOR YOUR CONTACT LENS. FOR PATIENTS ADMITTED TO THE HOSPITAL--CHECK OUT TIME THE DAY OF DISCHARGE IS 11:00 AM.  ALL INPATIENT ROOMS ARE PRIVATE - WITH BATHROOM, TELEPHONE, TELEVISION AND WIFI INTERNET. IF YOU ARE BEING DISCHARGED THE SAME DAY OF YOUR SURGERY--YOU CAN NOT DRIVE YOURSELF HOME--AND SHOULD NOT GO HOME ALONE BY TAXI OR BUS.  NO DRIVING OR OPERATING MACHINERY FOR 24 HOURS FOLLOWING ANESTHESIA / PAIN MEDICATIONS.                            SPECIAL INSTRUCTIONS:  CHLORHEXIDINE SOAP SHOWER (other brand names are Betasept and Hibiclens ) PLEASE SHOWER WITH CHLORHEXIDINE THE NIGHT BEFORE YOUR SURGERY AND THE AM OF YOUR SURGERY. DO NOT USE CHLORHEXIDINE ON YOUR FACE OR PRIVATE AREAS--YOU MAY USE  YOUR NORMAL SOAP THOSE AREAS AND YOUR NORMAL SHAMPOO.  WOMEN SHOULD AVOID SHAVING UNDER ARMS AND SHAVING LEGS 57 HOURS BEFORE USING CHLORHEXIDINE TO AVOID SKIN IRRITATION.  DO NOT USE IF ALLERGIC TO CHLORHEXIDINE.  PLEASE READ OVER ANY  FACT SHEETS THAT YOU WERE GIVEN: MRSA INFORMATION, BLOOD TRANSFUSION INFORMATION, INCENTIVE SPIROMETER INFORMATION.

## 2011-10-06 NOTE — Pre-Procedure Instructions (Signed)
CBC AND BMET WERE DONE TODAY PREOP --EKG AND CXR NOT NEEDED PREOP-PER ANESTHESIOLOGIST'S GUIDELINES.

## 2011-10-08 NOTE — H&P (Addendum)
History of Present Illness               61 yo f/u BPH and elevated PSA.   PSA of 4.7 to on March 2012. His PSA was 3.05 in March of 2010. PSA in 2007 was 1.86. His PCA3 was "negative" 01-16-2011 (20%). His father died of prostate cancer.    He has been on Flomax for several years and now is on generic tamsulosin. He has had increased voiding symptoms with more frequency and nocturia. He was put on finasteride Mar 2012 but he did not start. He also had dysuria, right inguinal and scrotal discomfort Mar 2012.  He has past history of hypogonadism but is off treatment now. Recent testosterone level was low-normal. He has low energy and low libido.  He has some continued problems with erectile dysfunction. He takes Viagra with fair result.   Interval Hx He continues to have sudden urgency. He will go to void and will have a weak stream which can be furstrating to him. He doesn't feel like he empties his bladder. Most bothersome is urgency. He underwent UDS and follows-up today to review.   January 2013 urodynamics: Patient had normal storage with an obstructive flow pattern.He did not have the urge to void and was catheterized for 350 mL. Bladder capacity was 456 mL with very mild instability and no leakage. There was no stress incontinence. He voided 293 mL with a Q. max of 14 mL per second and a detrusor pressure of 63 cm per water. Max detrusor pressure of 74 cm of water. Post void 186 mL.  We discussed the nature risk and benefits of cystoscopy and elects to proceed.   Past Medical History Problems  1. History of  Adult Sleep Apnea 780.57 2. History of  Arthritis V13.4 3. History of  Coagulation Defects 286.9 4. History of  Esophageal Reflux 530.81 5. History of  Pulmonary Embolism V12.55  Surgical History Problems  1. History of  Appendectomy 2. History of  Back Surgery 3. History of  Gallbladder Surgery 4. History of  Gastric Surgery 5. History of  Knee Replacement 6. History of   Wrist Surgery  Current Meds 1. Aspirin 81 MG Oral Tablet; Therapy: (Recorded:04Apr2012) to 2. CPAP; Therapy: (Recorded:04Apr2012) to 3. FentaNYL 25 MCG/HR Transdermal Patch 72 Hour; Therapy: 26Oct2011 to 4. Finasteride 5 MG Oral Tablet; Therapy: 26Mar2012 to 5. Fish Oil CAPS; Therapy: (Recorded:04Apr2012) to 6. Fluticasone Propionate 50 MCG/ACT Nasal Suspension; Therapy: [id number] to 7. Lotemax 0.5 % Ophthalmic Suspension; Therapy: 24Jan2012 to 8. Multivitamins TABS; Therapy: (Recorded:04Apr2012) to 9. Sertraline HCl 50 MG Oral Tablet; Therapy: 23Sep2011 to 10. Tamsulosin HCl 0.4 MG Oral Capsule; Therapy: [id number] to 11. ZyrTEC Allergy 10 MG Oral Tablet; Therapy: (Recorded:04Apr2012) to  Allergies Medication  1. No Known Drug Allergies  Family History Problems  1. Paternal history of  Death In The Family Father father passed @ age 77prostate and bone cancer 2. Paternal history of  Diabetes Mellitus V18.0 3. Sororal history of  Diabetes Mellitus V18.0 4. Family history of  Family Health Status Number Of Children 1 daughter 5. Family history of  Family Health Status Of Child 1 - Deceased 6. Maternal history of  Heart Disease 7. Paternal history of  Nephrolithiasis 8. Paternal history of  Prostate Cancer V16.42 9. Maternal history of  Thromboembolic Disease  Social History Problems  1. Alcohol Use 2-3 beers per week 2. Caffeine Use three 3. Marital History - Single 4. History of  Tobacco Use  V15.82 quit 22 years ago; smoked one pack per day for 16 years  Review of Systems  Gastrointestinal: no nausea, no vomiting, no diarrhea and no constipation.  Constitutional: no fever and no recent weight loss.  Cardiovascular: no chest pain.  Respiratory: no shortness of breath.    Physical Exam Constitutional: Well nourished and well developed . No acute distress.  Pulmonary: No respiratory distress and normal respiratory rhythm and effort.  Cardiovascular: Heart rate and  rhythm are normal . No peripheral edema.  Neuro/Psych:. Mood and affect are appropriate.    Results/Data Urine [Data Includes: Last 1 Day]   21Feb2013  COLOR YELLOW   APPEARANCE CLEAR   SPECIFIC GRAVITY 1.010   pH 5.5   GLUCOSE NEG mg/dL  BILIRUBIN NEG   KETONE NEG mg/dL  BLOOD TRACE   PROTEIN NEG mg/dL  UROBILINOGEN 0.2 mg/dL  NITRITE NEG   LEUKOCYTE ESTERASE NEG   SQUAMOUS EPITHELIAL/HPF NONE SEEN   WBC NONE SEEN WBC/hpf  RBC 0-3 RBC/hpf  BACTERIA NONE SEEN   CRYSTALS NONE SEEN   CASTS NONE SEEN    Procedure  Procedure: Cystoscopy  Indication: Lower Urinary Tract Symptoms.  Informed Consent: Risks, benefits, and potential adverse events were discussed and informed consent was obtained from the patient.  Prep: The patient was prepped with betadine.  Procedure Note:  Urethral meatus:. No abnormalities.  Anterior urethra: No abnormalities.  Prostatic urethra: No abnormalities . The lateral and median prostatic lobes were enlarged. An enlarged intravesical median lobe was visualized.  Bladder: Visulization was clear. The ureteral orifices were in the normal anatomic position bilaterally and had clear efflux of urine. A systematic survey of the bladder demonstrated no bladder tumors or stones. The mucosa was smooth without abnormalities. The patient tolerated the procedure well.  Complications: None.    Assessment Assessed  1. Benign Prostatic Hypertrophy With Urinary Obstruction 600.01 2. Feelings Of Urinary Urgency 788.63  Plan Benign Prostatic Hypertrophy With Urinary Obstruction (600.01), Feelings Of Urinary Urgency (788.63)  1. Cysto  Done: 21Feb2013 Health Maintenance (V70.0)  2. UA With REFLEX  Done: [id number] 09:50AM  Discussion/Summary        I discussed with the patient and his urodynamic findings. He has mixed storage and flow symptoms. Using the prostate problems handout we discussed the nature risks and benefits of continuing alpha blockers alone or with  the addition of a 5 alpha reductase inhibitor. We discussed daily Cialis. We discussed surgical therapy for the prostate with both office or hospital-based procedures including microwave therapy, resection of the prostate or vaporization of the prostate. We therapy primarily directed urgency include behavioral therapy, physical therapy, anticholinergics and beta 3 agonist, or neuromodulation. I'd be hesitant to use medical therapy as he appears obstructed and has elevated postvoid residuals.   We discussed he had a large intravesical median lobe on cystoscopy with 100% visual occlusion. We discussed it nature risks benefits and alternatives to transurethral resection vaporization of the prostate. We discussed risks of bleeding, infection, incontinence, strictures, fluid overload, thrombotic events, anesthetic complications, worsening/de novo urgency, and sexual dysfunction (impotence/retrograde ejaculation) among others. We talked about the 85 to 90 percent success rate with flow symptoms. He understands that overactive bladder symptoms could settle down as well in most cases, but persist or worsen. We discussed treatment goals may not be reached and/or may require multiple procedures. We discussed the likelihood of achieving the goals of the procedure and potential problems that might occur during the procedure or recuperation. All questions answered.  Patient elects to proceed with transurethral resection vaporization of the prostate which we will use the bipolar.   H&P update - pt seen and examined today. No change in H&P.

## 2011-10-09 ENCOUNTER — Encounter (HOSPITAL_COMMUNITY)
Admission: RE | Disposition: A | Discharge status: Home / Self Care | Payer: Self-pay | Source: Ambulatory Visit | Attending: Urology

## 2011-10-09 ENCOUNTER — Encounter (HOSPITAL_COMMUNITY): Payer: Self-pay | Admitting: *Deleted

## 2011-10-09 ENCOUNTER — Ambulatory Visit (HOSPITAL_COMMUNITY)
Admission: RE | Admit: 2011-10-09 | Discharge: 2011-10-11 | Disposition: A | Discharge status: Home / Self Care | Payer: Medicare Other | Source: Ambulatory Visit | Attending: Urology | Admitting: Urology

## 2011-10-09 ENCOUNTER — Encounter (HOSPITAL_COMMUNITY): Payer: Self-pay | Admitting: Anesthesiology

## 2011-10-09 ENCOUNTER — Ambulatory Visit (HOSPITAL_COMMUNITY): Payer: Medicare Other | Admitting: Anesthesiology

## 2011-10-09 DIAGNOSIS — Z86711 Personal history of pulmonary embolism: Secondary | ICD-10-CM | POA: Insufficient documentation

## 2011-10-09 DIAGNOSIS — Z8739 Personal history of other diseases of the musculoskeletal system and connective tissue: Secondary | ICD-10-CM | POA: Insufficient documentation

## 2011-10-09 DIAGNOSIS — Z01812 Encounter for preprocedural laboratory examination: Secondary | ICD-10-CM | POA: Insufficient documentation

## 2011-10-09 DIAGNOSIS — G4730 Sleep apnea, unspecified: Secondary | ICD-10-CM | POA: Insufficient documentation

## 2011-10-09 DIAGNOSIS — N401 Enlarged prostate with lower urinary tract symptoms: Secondary | ICD-10-CM | POA: Insufficient documentation

## 2011-10-09 DIAGNOSIS — D689 Coagulation defect, unspecified: Secondary | ICD-10-CM | POA: Insufficient documentation

## 2011-10-09 DIAGNOSIS — N320 Bladder-neck obstruction: Secondary | ICD-10-CM | POA: Insufficient documentation

## 2011-10-09 DIAGNOSIS — N138 Other obstructive and reflux uropathy: Secondary | ICD-10-CM | POA: Insufficient documentation

## 2011-10-09 DIAGNOSIS — Z7982 Long term (current) use of aspirin: Secondary | ICD-10-CM | POA: Insufficient documentation

## 2011-10-09 DIAGNOSIS — K219 Gastro-esophageal reflux disease without esophagitis: Secondary | ICD-10-CM | POA: Insufficient documentation

## 2011-10-09 DIAGNOSIS — N400 Enlarged prostate without lower urinary tract symptoms: Secondary | ICD-10-CM

## 2011-10-09 DIAGNOSIS — Z79899 Other long term (current) drug therapy: Secondary | ICD-10-CM | POA: Insufficient documentation

## 2011-10-09 DIAGNOSIS — R3915 Urgency of urination: Secondary | ICD-10-CM | POA: Insufficient documentation

## 2011-10-09 HISTORY — PX: TRANSURETHRAL RESECTION OF PROSTATE: SHX73

## 2011-10-09 SURGERY — TRANSURETHRAL RESECTION OF THE PROSTATE WITH GYRUS INSTRUMENTS
Anesthesia: General | Site: Bladder | Wound class: Clean Contaminated

## 2011-10-09 MED ORDER — SODIUM CHLORIDE 0.9 % IJ SOLN
3.0000 mL | Freq: Two times a day (BID) | INTRAMUSCULAR | Status: DC
  Administered 2011-10-10 (×2): 3 mL via INTRAVENOUS

## 2011-10-09 MED ORDER — LORATADINE 10 MG PO TABS
10.0000 mg | ORAL_TABLET | Freq: Every day | ORAL | Status: DC
  Administered 2011-10-10 – 2011-10-11 (×2): 10 mg via ORAL
  Filled 2011-10-09 (×2): qty 1

## 2011-10-09 MED ORDER — MIDAZOLAM HCL 5 MG/5ML IJ SOLN
INTRAMUSCULAR | Status: DC | PRN
  Administered 2011-10-09 (×2): 0.5 mg via INTRAVENOUS
  Administered 2011-10-09: 1 mg via INTRAVENOUS

## 2011-10-09 MED ORDER — DOCUSATE SODIUM 100 MG PO CAPS
100.0000 mg | ORAL_CAPSULE | Freq: Two times a day (BID) | ORAL | Status: DC
  Administered 2011-10-09 – 2011-10-11 (×4): 100 mg via ORAL
  Filled 2011-10-09 (×5): qty 1

## 2011-10-09 MED ORDER — SODIUM CHLORIDE 0.9 % IR SOLN
Status: DC | PRN
  Administered 2011-10-09: 18000 mL

## 2011-10-09 MED ORDER — CEFAZOLIN SODIUM 1-5 GM-% IV SOLN
INTRAVENOUS | Status: DC | PRN
  Administered 2011-10-09: 2 g via INTRAVENOUS

## 2011-10-09 MED ORDER — OXYCODONE-ACETAMINOPHEN 5-325 MG PO TABS: 1.0000 | ORAL_TABLET | Freq: Four times a day (QID) | ORAL | Status: DC | PRN

## 2011-10-09 MED ORDER — TAMSULOSIN HCL 0.4 MG PO CAPS
0.4000 mg | ORAL_CAPSULE | Freq: Every day | ORAL | Status: DC
  Administered 2011-10-10 – 2011-10-11 (×2): 0.4 mg via ORAL
  Filled 2011-10-09 (×2): qty 1

## 2011-10-09 MED ORDER — CEFAZOLIN SODIUM-DEXTROSE 2-3 GM-% IV SOLR: 2.0000 g | Freq: Once | INTRAVENOUS | Status: DC

## 2011-10-09 MED ORDER — POLYVINYL ALCOHOL 1.4 % OP SOLN
1.0000 [drp] | Freq: Two times a day (BID) | OPHTHALMIC | Status: DC
  Administered 2011-10-09 – 2011-10-11 (×4): 1 [drp] via OPHTHALMIC
  Filled 2011-10-09 (×2): qty 15

## 2011-10-09 MED ORDER — ONDANSETRON HCL 4 MG/2ML IJ SOLN
INTRAMUSCULAR | Status: DC | PRN
  Administered 2011-10-09 (×2): 2 mg via INTRAVENOUS

## 2011-10-09 MED ORDER — ACETAMINOPHEN 10 MG/ML IV SOLN
INTRAVENOUS | Status: AC
  Filled 2011-10-09: qty 100

## 2011-10-09 MED ORDER — ACETAMINOPHEN 10 MG/ML IV SOLN
INTRAVENOUS | Status: DC | PRN
  Administered 2011-10-09: 1000 mg via INTRAVENOUS

## 2011-10-09 MED ORDER — CEFAZOLIN SODIUM-DEXTROSE 2-3 GM-% IV SOLR
INTRAVENOUS | Status: AC
  Filled 2011-10-09: qty 50

## 2011-10-09 MED ORDER — SODIUM CHLORIDE 0.9 % IV SOLN
250.0000 mL | INTRAVENOUS | Status: DC
  Administered 2011-10-09: 250 mL via INTRAVENOUS

## 2011-10-09 MED ORDER — EPHEDRINE SULFATE 50 MG/ML IJ SOLN
INTRAMUSCULAR | Status: DC | PRN
  Administered 2011-10-09 (×4): 5 mg via INTRAVENOUS

## 2011-10-09 MED ORDER — LACTATED RINGERS IV SOLN
INTRAVENOUS | Status: DC | PRN
  Administered 2011-10-09: 09:00:00 via INTRAVENOUS

## 2011-10-09 MED ORDER — FLUTICASONE PROPIONATE 50 MCG/ACT NA SUSP
1.0000 | Freq: Every day | NASAL | Status: DC
  Administered 2011-10-09 – 2011-10-11 (×3): 1 via NASAL
  Filled 2011-10-09 (×2): qty 16

## 2011-10-09 MED ORDER — LACTATED RINGERS IV SOLN
INTRAVENOUS | Status: DC
  Administered 2011-10-09: 1000 mL via INTRAVENOUS

## 2011-10-09 MED ORDER — OXYCODONE-ACETAMINOPHEN 5-325 MG PO TABS: 1.0000 | ORAL_TABLET | Freq: Four times a day (QID) | ORAL | Status: AC | PRN

## 2011-10-09 MED ORDER — ACETAMINOPHEN 325 MG PO TABS: 650.0000 mg | ORAL_TABLET | ORAL | Status: DC | PRN

## 2011-10-09 MED ORDER — SODIUM CHLORIDE 0.9 % IR SOLN
Status: DC | PRN
  Administered 2011-10-09: 3000 mL

## 2011-10-09 MED ORDER — FENTANYL CITRATE 0.05 MG/ML IJ SOLN: 25.0000 ug | INTRAMUSCULAR | Status: DC | PRN

## 2011-10-09 MED ORDER — DEXAMETHASONE SODIUM PHOSPHATE 10 MG/ML IJ SOLN
INTRAMUSCULAR | Status: DC | PRN
  Administered 2011-10-09: 10 mg via INTRAVENOUS

## 2011-10-09 MED ORDER — LIDOCAINE HCL (CARDIAC) 20 MG/ML IV SOLN
INTRAVENOUS | Status: DC | PRN
  Administered 2011-10-09: 100 mg via INTRAVENOUS

## 2011-10-09 MED ORDER — ASPIRIN 81 MG PO TABS: 81.0000 mg | ORAL_TABLET | Freq: Every day | ORAL | Status: DC

## 2011-10-09 MED ORDER — MORPHINE SULFATE 2 MG/ML IJ SOLN: 2.0000 mg | INTRAMUSCULAR | Status: DC | PRN

## 2011-10-09 MED ORDER — CIPROFLOXACIN HCL 500 MG PO TABS: 250.0000 mg | ORAL_TABLET | Freq: Two times a day (BID) | ORAL | Status: AC

## 2011-10-09 MED ORDER — FENTANYL CITRATE 0.05 MG/ML IJ SOLN
INTRAMUSCULAR | Status: DC | PRN
  Administered 2011-10-09 (×2): 50 ug via INTRAVENOUS

## 2011-10-09 MED ORDER — FENTANYL 25 MCG/HR TD PT72
25.0000 ug | MEDICATED_PATCH | TRANSDERMAL | Status: DC
  Administered 2011-10-11: 25 ug via TRANSDERMAL
  Filled 2011-10-09: qty 1

## 2011-10-09 MED ORDER — SERTRALINE HCL 50 MG PO TABS
50.0000 mg | ORAL_TABLET | Freq: Every day | ORAL | Status: DC
  Administered 2011-10-10 – 2011-10-11 (×2): 50 mg via ORAL
  Filled 2011-10-09 (×2): qty 1

## 2011-10-09 MED ORDER — DOCUSATE SODIUM 100 MG PO CAPS: 100.0000 mg | ORAL_CAPSULE | Freq: Two times a day (BID) | ORAL | Status: AC

## 2011-10-09 MED ORDER — PROPOFOL 10 MG/ML IV EMUL
INTRAVENOUS | Status: DC | PRN
  Administered 2011-10-09: 200 mg via INTRAVENOUS

## 2011-10-09 MED ORDER — CEFAZOLIN SODIUM 1-5 GM-% IV SOLN
1.0000 g | Freq: Three times a day (TID) | INTRAVENOUS | Status: AC
  Administered 2011-10-09 – 2011-10-10 (×2): 1 g via INTRAVENOUS
  Filled 2011-10-09 (×2): qty 50

## 2011-10-09 MED ORDER — SODIUM CHLORIDE 0.9 % IJ SOLN: 3.0000 mL | INTRAMUSCULAR | Status: DC | PRN

## 2011-10-09 MED ORDER — METHYLCELLULOSE 1 % OP SOLN: 1.0000 [drp] | Freq: Two times a day (BID) | OPHTHALMIC | Status: DC

## 2011-10-09 MED ORDER — LACTATED RINGERS IV SOLN
INTRAVENOUS | Status: DC
  Administered 2011-10-09: 13:00:00 via INTRAVENOUS

## 2011-10-09 SURGICAL SUPPLY — 23 items
BAG URINE DRAINAGE (UROLOGICAL SUPPLIES) ×2 IMPLANT
BAG URO CATCHER STRL LF (DRAPE) ×2 IMPLANT
BLADE SURG 15 STRL LF DISP TIS (BLADE) IMPLANT
BLADE SURG 15 STRL SS (BLADE)
CATH FOLEY 3WAY 30CC 22FR (CATHETERS) ×2 IMPLANT
CLOTH BEACON ORANGE TIMEOUT ST (SAFETY) ×2 IMPLANT
DRAPE CAMERA CLOSED 9X96 (DRAPES) ×2 IMPLANT
ELECT BUTTON HF 24-28F 2 30DE (ELECTRODE) ×2 IMPLANT
ELECT HF RESECT BIPO 24F 45 ND (CUTTING LOOP) ×1 IMPLANT
ELECT LOOP MED HF 24F 12D (CUTTING LOOP) ×2 IMPLANT
ELECT LOOP MED HF 24F 12D CBL (CLIP) ×1 IMPLANT
EVACUATOR MICROVAS BLADDER (UROLOGICAL SUPPLIES) ×2 IMPLANT
GLOVE BIOGEL M STRL SZ7.5 (GLOVE) ×2 IMPLANT
GOWN STRL NON-REIN LRG LVL3 (GOWN DISPOSABLE) ×2 IMPLANT
GOWN STRL REIN XL XLG (GOWN DISPOSABLE) ×2 IMPLANT
HOLDER FOLEY CATH W/STRAP (MISCELLANEOUS) IMPLANT
IV NS IRRIG 3000ML ARTHROMATIC (IV SOLUTION) ×8 IMPLANT
KIT ASPIRATION TUBING (SET/KITS/TRAYS/PACK) ×2 IMPLANT
MANIFOLD NEPTUNE II (INSTRUMENTS) ×2 IMPLANT
PACK CYSTO (CUSTOM PROCEDURE TRAY) ×2 IMPLANT
SUT ETHILON 3 0 PS 1 (SUTURE) IMPLANT
SYR 30ML LL (SYRINGE) ×1 IMPLANT
TUBING CONNECTING 10 (TUBING) ×2 IMPLANT

## 2011-10-09 NOTE — Transfer of Care (Signed)
Immediate Anesthesia Transfer of Care Note  Patient: [name]  Procedure(s) Performed: Procedure(s) (LRB): TRANSURETHRAL RESECTION OF THE PROSTATE WITH GYRUS INSTRUMENTS (N/A)  Patient Location: PACU  Anesthesia Type: General  Level of Consciousness: awake, sedated and patient cooperative  Airway & Oxygen Therapy: Patient Spontanous Breathing and Patient connected to face mask oxygen  Post-op Assessment: Report given to PACU RN, Post -op Vital signs reviewed and stable and Patient moving all extremities  Post vital signs: Reviewed and stable  Complications: No apparent anesthesia complications

## 2011-10-09 NOTE — Brief Op Note (Signed)
10/09/2011  11:20 AM  PATIENT:  [name]  61 y.o. male  PRE-OPERATIVE DIAGNOSIS:  benign prostatic hypertrophy  POST-OPERATIVE DIAGNOSIS:  benign prostatic hypertrophy  PROCEDURE:  Procedure(s) (LRB): TRANSURETHRAL RESECTION/vaporization OF THE PROSTATE WITH GYRUS INSTRUMENTS (N/A)  SURGEON:  Surgeon(s) and Role:    * Fredricka Bonine, MD - Primary   ANESTHESIA:   general  EBL:  Total I/O In: 800 [I.V.:800] Out: - minimal  BLOOD ADMINISTERED:none  DRAINS: Urinary Catheter (Foley) 22 Fr 3 way foley  LOCAL MEDICATIONS USED:  NONE  SPECIMEN:  Biopsy / Limited ResectionTURP chips DISPOSITION OF SPECIMEN:  PATHOLOGY   DICTATION: [id number]  PLAN OF CARE: Admit for overnight observation  PATIENT DISPOSITION:  PACU - hemodynamically stable.   Delay start of Pharmacological VTE agent (>24hrs) due to surgical blood loss or risk of bleeding: not applicable

## 2011-10-09 NOTE — Preoperative (Signed)
Beta Blockers   Reason not to administer Beta Blockers:Not Applicable 

## 2011-10-09 NOTE — Anesthesia Preprocedure Evaluation (Addendum)
Anesthesia Evaluation  Patient identified by MRN, date of birth, ID band Patient awake    Reviewed: Allergy & Precautions, H&P , NPO status , Patient's Chart, lab work & pertinent test results  Airway Mallampati: II TM Distance: >3 FB Neck ROM: Full    Dental No notable dental hx.    Pulmonary sleep apnea and Continuous Positive Airway Pressure Ventilation , former smoker H/o pulmonary embolism. OSA orders written breath sounds clear to auscultation  Pulmonary exam normal       Cardiovascular negative cardio ROS  Rhythm:Regular Rate:Normal     Neuro/Psych negative neurological ROS  negative psych ROS   GI/Hepatic Neg liver ROS, GERD-  Controlled,S/p nissen fundoplication.   Endo/Other  negative endocrine ROS  Renal/GU negative Renal ROS  negative genitourinary   Musculoskeletal negative musculoskeletal ROS (+)   Abdominal   Peds negative pediatric ROS (+)  Hematology negative hematology ROS (+)   Anesthesia Other Findings   Reproductive/Obstetrics negative OB ROS                          Anesthesia Physical Anesthesia Plan  ASA: II  Anesthesia Plan: General   Post-op Pain Management:    Induction: Intravenous  Airway Management Planned: LMA  Additional Equipment:   Intra-op Plan:   Post-operative Plan: Extubation in OR  Informed Consent: I have reviewed the patients History and Physical, chart, labs and discussed the procedure including the risks, benefits and alternatives for the proposed anesthesia with the patient or authorized representative who has indicated his/her understanding and acceptance.   Dental advisory given  Plan Discussed with: CRNA  Anesthesia Plan Comments:         Anesthesia Quick Evaluation

## 2011-10-09 NOTE — Op Note (Signed)
NAMERORAN, WEGNER NO.:  [id number]  MEDICAL RECORD NO.:  [id number]  LOCATION:  9381                         FACILITY:  [facility]  PHYSICIAN:  Festus Aloe, MD   DATE OF BIRTH:  1951/02/14  DATE OF PROCEDURE: DATE OF DISCHARGE:                              OPERATIVE REPORT   PREOPERATIVE DIAGNOSES: 1. Benign prostatic hypertrophy. 2. Bladder outlet obstruction. 3. Incomplete bladder emptying. 4. Lower urinary tract symptoms of urgency and weak stream.  POSTOPERATIVE DIAGNOSES: 1. Benign prostatic hypertrophy. 2. Bladder outlet obstruction. 3. Incomplete bladder emptying. 4. Lower urinary tract symptoms of urgency and weak stream.  PROCEDURE:  Transurethral resection and vaporization of the prostate.  SURGEON:  Festus Aloe, MD  TYPE OF ANESTHESIA:  General.  INDICATION FOR PROCEDURE:  Mr. Veney is a 61 year old male who complained of frequency, urgency, and weak stream.  He had elevated postvoid residuals, and on urodynamics, had mild instability on filling, but high-pressure, low-flow voiding consistent with bladder outlet obstruction.  I discussed the findings with the patient.  We discussed the range of treatments for BPH with lower urinary tract symptoms and overactive bladder symptoms.  On cystoscopy, he did have a large intravesical median lobe completely obstructing his bladder neck.  We discussed the nature, risks, benefits, and alternatives to transurethral resection and vaporization of the prostate.  All questions were answered and he elected to proceed with resection and vaporization of the prostate.  FINDINGS:  On exam under anesthesia, the patient had approximately 60 g prostate that was benign with no hard areas or nodules.  The penis and testicles were also normal without masses or lesions.  On cystoscopy, there was lateral lobe hypertrophy with 100% visual occlusion within an elongated prostatic urethra.  The less  significantly an enlarged intravesical median lobe causing a ball valve effect.  The bladder itself was normal without stone, mass, or lesion.  At the end of resection, both ureteral orifices were identified and noted to be intact without injury.  There was excellent hemostasis and no chips in the bladder.  DESCRIPTION OF PROCEDURE:  After consent was obtained, the patient brought to the operating room.  A time-out was performed to confirm the patient and procedure.  After adequate anesthesia, she was placed in lithotomy position.  An exam under anesthesia was performed.  He was then prepped and draped in the usual fashion.  The 25-French resectoscope was passed with the visual obturator into the bladder, and then the loop was passed.  Starting at the 5 o'clock and 7 o'clock, the prostate was resected down to the bladder neck and capsule down to the veru.  This left a very large median lobe in between the initial resection and the median lobe was resected then down to the bladder neck and capsule connecting this with the lateral incisions.  At this point, all the chips were irrigated out of the bladder and the resection site was inspected and noted to have good hemostasis with clearance of the entire median lobe and intact ureteral orifices.  Next, the left lateral lobe was then resected more proximally from 2 down to connecting with the median lobe resection and then more  apically starting superiorly to inferiorly resecting this lobe down to connection with the median lobe resection.  All the chips were drained and adequate hemostasis was ensured.  In a similar fashion, the right lobe was then resected from approximately 11 o'clock down to connecting with the median lobe resection, first proximally and then apically.  At no time, was resection carried out past the veru.  All the chips were evacuated and adequate hemostasis was ensured.  There was still quite a bit of lateral and apical  tissue.  Therefore the loop was removed and the button was inserted.  Initially at the left side and then the right side was vaporized down to the apical tissue creating a nice open channel of the prostate with a good hemostasis.  Looking from the veru, there was a good channel and adequate apical tissue removal.  The bladder was then carefully inspected and noted to be free of chips.  The ureteral orifices were again inspected and noted to be normal.  The bladder was drained and hemostasis was ensured.  The bladder was refilled and the scope removed.  The 22-French serial Foley was passed with a catheter guide and inserted in the bladder.  The balloon was inflated to 30 cc and sat at the bladder neck.  Irrigation was clear.  The irrigation was connected to CBI for wake up and transport.  He was then awakened and taken to recovery room in stable condition.  COMPLICATIONS:  None.  ESTIMATED BLOOD LOSS:  Minimal.  DRAINS:  Twenty two-French serial Foley.  SPECIMEN:  To Pathology.  DISPOSITION:  The patient be admitted for overnight observation, and void trial in the morning.          ______________________________ Festus Aloe, MD     ME/MEDQ  D:  10/09/2011  T:  10/09/2011  Job:  [id number]

## 2011-10-09 NOTE — Anesthesia Postprocedure Evaluation (Signed)
  Anesthesia Post-op Note  Patient: [name]  Procedure(s) Performed: Procedure(s) (LRB): TRANSURETHRAL RESECTION OF THE PROSTATE WITH GYRUS INSTRUMENTS (N/A)  Patient Location: PACU  Anesthesia Type: General  Level of Consciousness: awake and alert   Airway and Oxygen Therapy: Patient Spontanous Breathing  Post-op Pain: mild  Post-op Assessment: Post-op Vital signs reviewed, Patient's Cardiovascular Status Stable, Respiratory Function Stable, Patent Airway and No signs of Nausea or vomiting  Post-op Vital Signs: stable  Complications: No apparent anesthesia complications

## 2011-10-09 NOTE — Progress Notes (Signed)
Placed patient on CPAP of 9 cmh2o with paitents home mask and 2 liters of oxygen bled in. Patient tolerating well at this time with sat of 95%.

## 2011-10-10 NOTE — Progress Notes (Signed)
Urology Progress Note  1 Day Post-Op   Subjective: Post TURP yesterday    No acute urologic events overnight. Ambulation: yes   Flatus:  yes   Bowel movement no   Pain: some relief  Objective: catheter out. Pt unabel to void. Having bloody drainage from urethra.   Blood pressure 105/64, pulse 71, temperature 98 F (36.7 C), temperature source Oral, resp. rate 18, height 5' 8"  (1.727 m), weight 100.1 kg (220 lb 10.9 oz), SpO2 95.00%.  Physical Exam:  General:  No acute distress, awake Resp: clear to auscultation bilaterally Extremities: extremities normal, atraumatic, no cyanosis or edema Genitourinary:  Normal penis. Bloody urethral drainage Foley: out    I/O last 3 completed shifts: In: 2890 [P.O.:240; I.V.:1050; Other:1500; IV Piggyback:100] Out: 525 [Urine:525]  No results found for this basename: HGB:2,WBC:2,PLT:2 in the last 72 hours  No results found for this basename: NA:2,K:2,CL:2,CO2:2,BUN:2,CREATININE:2,CALCIUM:2,MAGNESIUM:2,GFRNONAA:2,GFRAA:2 in the last 72 hours   No results found for this basename: PT:2,INR:2,APTT:2 in the last 72 hours   No components found with this basename: ABG:2  Assessment/Plan: Cath out , but pt unable to void today. Will probably need replacement of catheter and hold discharge.   Catheter removed. Will need replacement.

## 2011-10-10 NOTE — Progress Notes (Signed)
Pt placed on CPAP for the night at current setting of 9 cmH2O.  Pt is tolerating CPAP well through his home nasal mask with 2 L/M O2 bled-in.  Pt encouraged to notify RN/ RT of any concerns.

## 2011-10-11 MED ORDER — URELLE 81 MG PO TABS: 1.0000 | ORAL_TABLET | Freq: Three times a day (TID) | ORAL | Status: DC

## 2011-10-11 MED ORDER — TRIMETHOPRIM 100 MG PO TABS: 100.0000 mg | ORAL_TABLET | ORAL | Status: AC

## 2011-10-11 MED ORDER — HYDROCODONE-ACETAMINOPHEN 7.5-650 MG PO TABS: 1.0000 | ORAL_TABLET | Freq: Four times a day (QID) | ORAL | Status: AC | PRN

## 2011-10-11 MED ORDER — TAMSULOSIN HCL 0.4 MG PO CAPS: 0.4000 mg | ORAL_CAPSULE | Freq: Every day | ORAL | Status: DC

## 2011-10-11 NOTE — Progress Notes (Signed)
Patient having leaking around foley catheter at urethra.  Irrigated foley with 30cc of sterile water.  Small bright red clot present.  Urine free flowing into drainage bag to gravity.  Urine is now light pink in color.  No clots currently seen.  Will continue to monitor patient.  Patient denies any pain.

## 2011-10-11 NOTE — Progress Notes (Signed)
10/11/11 0430 Hand irrigated foley with 92ms NS because urine is still dark. Several clots were dislodged and foley is running medium red as of now. I will continue to monitor the color and consistency through out the remainder of the shift.

## 2011-10-11 NOTE — Progress Notes (Signed)
Urology Progress Note  2 Days Post-Op   Subjective:     No acute urologic events overnight. Ambulation:   positive Flatus:    positive Bowel movement  positive  Pain: some relief  Objective:  Blood pressure 124/79, pulse 65, temperature 97.8 F (36.6 C), temperature source Oral, resp. rate 18, height 5' 8"  (1.727 m), weight 100.1 kg (220 lb 10.9 oz), SpO2 95.00%.  Physical Exam:  General:  No acute distress, awake Resp: clear to auscultation bilaterally Cardio: regular rate and rhythm, S1, S2 normal, no murmur, click, rub or gallop Extremities: extremities normal, atraumatic, no cyanosis or edema Genitourinary:  Penis normal Foley: remains    I/O last 3 completed shifts: In: 64 [P.O.:720; IV Piggyback:50] Out: 2525 [Urine:2525]  No results found for this basename: HGB:2,WBC:2,PLT:2 in the last 72 hours  No results found for this basename: NA:2,K:2,CL:2,CO2:2,BUN:2,CREATININE:2,CALCIUM:2,MAGNESIUM:2,GFRNONAA:2,GFRAA:2 in the last 72 hours   No results found for this basename: PT:2,INR:2,APTT:2 in the last 72 hours   No components found with this basename: ABG:2  Assessment/Plan: Allow D/c wiht meds, to RTC for voiding trial. Call Dr. Junious Silk for follow-up..  Catheter not removed. Continue any current medications.

## 2011-10-11 NOTE — Discharge Summary (Signed)
Physician Discharge Summary  Patient ID: [name] MRN: [id number] DOB/AGE: [birth year] 61 y.o.  Admit date: 10/09/2011 Discharge date: 10/11/2011  Admission Diagnoses:  Discharge Diagnoses:  Active Problems:  * No active hospital problems. *    Discharged Condition: good  Hospital Course: TURP  Consults: None  Significant Diagnostic Studies: labs:  Treatments: IV hydration, antibiotics: Ancef, analgesia: acetaminophen and surgery:   Discharge Exam: Blood pressure 124/79, pulse 65, temperature 97.8 F (36.6 C), temperature source Oral, resp. rate 18, height 5' 8"  (1.727 m), weight 100.1 kg (220 lb 10.9 oz), SpO2 95.00%. General appearance: alert and cooperative  Disposition:   Discharge Orders    Future Orders Please Complete By Expires   Diet - low sodium heart healthy      Increase activity slowly      Discharge instructions      Comments:   Post transurethral resection of the prostate (TURP) instructions  Your recent prostate surgery requires very special post hospital care. Despite the fact that no skin incisions were used the area around the prostate incision is quite raw and is covered with a scab to promote healing and prevent bleeding. Certain cautions are needed to assure that the scab is not disturbed of the next 2-3 weeks while the healing proceeds.  Because the raw surface in your prostate and the irritating effects of urine you may expect frequency of urination and/or urgency (a stronger desire to urinate) and perhaps even getting up at night more often. This will usually resolve or improve slowly over the healing period. You may see some blood in your urine over the first 6 weeks. Do not be alarmed, even if the urine was clear for a while. Get off your feet and drink lots of fluids until clearing occurs. If you start to pass clots or don't improve call us.  Diet:  You may return to your normal diet immediately. Because of the raw surface of your bladder,  alcohol, spicy foods, foods high in acid and drinks with caffeine may cause irritation or frequency and should be used in moderation. To keep your urine flowing freely and avoid constipation, drink plenty of fluids during the day (8-10 glasses). Tip: Avoid cranberry juice because it is very acidic.  Activity:  Your physical activity doesn't need to be restricted. However, if you are very active, you may see some blood in the urine. We suggest that you reduce your activity under the circumstances until the bleeding has stopped.  Bowels:  It is important to keep your bowels regular during the postoperative period. Straining with bowel movements can cause bleeding. A bowel movement every other day is reasonable. Use a mild laxative if needed, such as milk of magnesia 2-3 tablespoons, or 2 Dulcolax tablets. Call if you continue to have problems. If you had been taking narcotics for pain, before, during or after your surgery, you may be constipated. Take a laxative if necessary.  Medication:  You should resume your pre-surgery medications unless told not to. In addition you may be given an antibiotic to prevent or treat infection. Antibiotics are not always necessary. All medication should be taken as prescribed until the bottles are finished unless you are having an unusual reaction to one of the drugs.     Problems you should report to [place]:  a. Fever greater than 101F. b. Heavy bleeding, or clots (see notes above about blood in urine). c. Inability to urinate. d. Drug reactions (hives, rash, nausea, vomiting, diarrhea). e. Severe  burning or pain with urination that is not improving.    Call MD for:  temperature >100.4      Call MD for:  severe uncontrolled pain      Discontinue IV        Medication List  As of 10/11/2011 10:50 AM   TAKE these medications         aspirin 81 MG tablet   Take 1 tablet (81 mg total) by mouth daily.   Start taking on: 10/13/2011      CALCIUM + D PO   Take  1 tablet by mouth daily.      cetirizine 10 MG tablet   Commonly known as: ZYRTEC   Take 10 mg by mouth daily.      ciprofloxacin 500 MG tablet   Commonly known as: CIPRO   Take 0.5 tablets (250 mg total) by mouth 2 (two) times daily.      docusate sodium 100 MG capsule   Commonly known as: COLACE   Take 1 capsule (100 mg total) by mouth 2 (two) times daily.      fentaNYL 25 MCG/HR   Commonly known as: DURAGESIC - dosed mcg/hr   Place 1 patch onto the skin every 3 (three) days. For chronic back pain      fish oil-omega-3 fatty acids 1000 MG capsule   Take 2 g by mouth daily.      fluticasone 50 MCG/ACT nasal spray   Commonly known as: FLONASE   Place 1 spray into the nose daily.      hydrocodone-acetaminophen 7.5-650 MG per tablet   Commonly known as: LORCET PLUS   Take 1 tablet by mouth every 6 (six) hours as needed for pain.      methylcellulose 1 % ophthalmic solution   Commonly known as: ARTIFICIAL TEARS   Place 1 drop into both eyes 2 (two) times daily.      mulitivitamin with minerals Tabs   Take 1 tablet by mouth daily.      oxyCODONE-acetaminophen 5-325 MG per tablet   Commonly known as: PERCOCET   Take 1 tablet by mouth every 6 (six) hours as needed for pain.      sertraline 50 MG tablet   Commonly known as: ZOLOFT   Take 50 mg by mouth daily after breakfast.      Tamsulosin HCl 0.4 MG Caps   Commonly known as: FLOMAX   Take 0.4 mg by mouth daily.      Tamsulosin HCl 0.4 MG Caps   Commonly known as: FLOMAX   Take 1 capsule (0.4 mg total) by mouth daily.      trimethoprim 100 MG tablet   Commonly known as: TRIMPEX   Take 1 tablet (100 mg total) by mouth 1 day or 1 dose.      URELLE 81 MG Tabs   Take 1 tablet (81 mg total) by mouth 3 (three) times daily.           Follow-up Information    Follow up with Junious Silk Marja Kays, MD in 1 day. (voiding trial)    Contact information:   [facility] [place] [phone]          Signed: Carolan Clines I 10/11/2011, 10:50 AM

## 2011-10-11 NOTE — Discharge Instructions (Signed)
Transurethral Resection of the Prostate Care After Refer to this sheet in the next few weeks. These discharge instructions provide you with general information on caring for yourself after you leave the hospital. Your caregiver may also give you specific instructions. Your treatment has been planned according to the most current medical practices available, but unavoidable complications sometimes occur. If you have any problems or questions after discharge, please call your caregiver. HOME CARE INSTRUCTIONS  Medications  You will receive medicine for pain management. As your level of discomfort decreases, adjustments in your pain medicines may be made.   Since it is important that you do deep breathing and coughing exercises and increase your activity, it may be helpful to take pain medicines prior to these activities. Take all medicines as directed.   You may be given a medicine (antibiotic) to kill germs following surgery. Finish all medicines. Let your caregiver know if you have any side effects or problems from the medicine.  Hygiene  You can take a shower after surgery.   You should not take a bath if you still have the urethral catheter.  Activity  You will be encouraged to get out of bed as much as possible and increase your activity level as tolerated.   Spend the first week in and around your home. For 10 days, avoid the following:   Straining.   Running.   Strenuous work.   Walks longer than a few blocks.   Riding for extended periods.   Sexual relations.   Do not lift heavy objects (more than 5 pounds/2.25 kilograms) for at least 1 month. When lifting, use your arms instead of your abdominal muscles.   You will be encouraged to walk as tolerated. Do not exert yourself. Increase your activity level slowly. Remember that it is important to keep moving after an operation of any type. This cuts down on the possibility of developing blood clots.   Your caregiver will tell  you when you can resume driving and light housework. Discuss this at your first office visit after discharge.  Diet  No special diet is ordered after a TURP. However, if you are on a special diet for another medical problem, it should be continued.   Normal fluid intake is usually recommended.   Avoid alcohol and caffeinated drinks for 2 weeks. They irritate the bladder. Decaffeinated drinks are okay.   Avoid spicy foods.  Bladder Function  For the first 10 days, empty the bladder whenever you feel a definite desire. Do not try to hold the urine for long periods of time.   Urinating once or twice a night even after you are healed is not uncommon.   You may see some recurrence of blood in the urine after discharge from the hospital. If this occurs, force fluids again as you did in the hospital and reduce your activity.  Bowel Function  You may experience some constipation after surgery. This can be minimized by increasing fluids and fiber in your diet. Drink enough water and fluids to keep your urine clear or pale yellow.   A stool softener may be prescribed for use at home. Do not strain to move your bowels.   If you are requiring increased pain medicine, it is important that you take stool softeners to prevent constipation. This will help to promote proper healing by reducing the need to strain to move your bowels.  Sexual Activity  Semen movement in the opposite direction and into the bladder (retrograde ejaculation) may  occur. Since the semen passes into the bladder, cloudy urine can occur the first time you urinate after intercourse. Or, you may not have an ejaculation during erection. Ask your caregiver when you can resume sexual activity. Retrograde ejaculation and reduced semen discharge should not reduce one's pleasure of intercourse.  Postoperative Visit  Arrange the date and time of your after surgery visit with your caregiver.  Return to Work  After your recovery is  complete, you will be able to return to work and resume all activities. Your caregiver will inform you when you can return to work.  Foley Catheter Care - IF you have a catheter: A soft, flexible tube (Foley catheter) has been placed in your bladder to drain urine and fluid. Follow these instructions: Taking Care of the Catheter  Keep the area where the catheter leaves your body clean.   Attach the catheter to the leg so there is no tension on the catheter.   Keep the drainage bag below the level of the bladder, but keep it OFF the floor.   Do not take long soaking baths. Your caregiver will give instructions about showering.   Wash your hands before touching ANYTHING related to the catheter or bag.   Using mild soap and warm water on a washcloth:   Clean the area closest to the catheter insertion site using a circular motion around the catheter.   Clean the catheter itself by wiping AWAY from the insertion site for several inches down the tube.   NEVER wipe upward as this could sweep bacteria up into the urethra (tube in your body that normally drains the bladder) and cause infection.   Place a small amount of sterile lubricant at the tip of the penis where the catheter is entering.  Taking Care of the Drainage Bags  Two drainage bags will be taken home: a large overnight drainage bag, and a smaller leg bag which fits underneath clothing.   It is okay to wear the overnight bag at any time, but NEVER wear the smaller leg bag at night.   Keep the drainage bag well below the level of your bladder. This prevents backflow of urine into the bladder and allows the urine to drain freely.   Anchor the tubing to your leg to prevent pulling or tension on the catheter. Use tape or a leg strap provided by the hospital.   Empty the drainage bag when it is 1/2 to 3/4 full. Wash your hands before and after touching the bag.   Periodically check the tubing for kinks to make sure there is no  pressure on the tubing which could restrict the flow of urine.  Changing the Drainage Bags  Cleanse both ends of the clean bag with alcohol before changing.   Pinch off the rubber catheter to avoid urine spillage during the disconnection.   Disconnect the dirty bag and connect the clean one.   Empty the dirty bag carefully to avoid a urine spill.   Attach the new bag to the leg with tape or a leg strap.  Cleaning the Drainage Bags  Whenever a drainage bag is disconnected, it must be cleaned quickly so it is ready for the next use.   Wash the bag in warm, soapy water.   Rinse the bag thoroughly with warm water.   Soak the bag for 30 minutes in a solution of white vinegar and water (1 cup vinegar to 1 quart warm water).   Rinse with warm water.  SEEK MEDICAL CARE IF:   You have chills or night sweats.   You are leaking around your catheter or have problems with your catheter. It is not uncommon to have sporadic leakage around your catheter as a result of bladder spasms. If the leakage stops, there is not much need for concern. If you are uncertain, call your caregiver.   You develop side effects that you think are coming from your medicines.  SEEK IMMEDIATE MEDICAL CARE IF:   You are suddenly unable to urinate. Check to see if there are any kinks in the drainage tubing that may cause this. If you cannot find any kinks, call your caregiver immediately. This is an emergency.   You develop shortness of breath or chest pains.   Bleeding persists or clots develop in your urine.   You have a fever.   You develop pain in your back or over your lower belly (abdomen).   You develop pain or swelling in your legs.   Any problems you are having get worse rather than better.  MAKE SURE YOU:   Understand these instructions.   Will watch your condition.   Will get help right away if you are not doing well or get worse.  Document Released: 07/20/2005 Document Revised: 07/09/2011  Document Reviewed: 03/13/2009 [facility] Patient Information 2012 [place].Benign Prostatic Hyperplasia You have an enlarged prostate. This is common in elderly males. It is called BPH. This stands for benign prostate hyperplasia. The prostate gland is located in base of the bladder. When it grows, the prostate blocks the urethra. This is the tube which drains urine from the bladder.  SYMPTOMS  Weak urine stream.   Dribbling.   Feeling like the bladder has not emptied completely.   Difficulty starting urination.   Getting up frequently at night to urinate.   Urinating more frequently during the day.  Complete urinary blockage or severe pain with urination requires immediate attention. DIAGNOSIS   Your caregiver often has a good idea what is wrong by taking a history and doing a physical exam.   Special x-rays may be done.  TREATMENT   For mild problems, no treatment may be necessary.   If the problems are moderate, medications may provide relief. Some of these work by making the prostate gland smaller. The herb saw palmetto is commonly used.   If complete blockage occurs, a Foley catheter is usually left in place for a few days.   Surgery is often needed for more severe problems. TURP is the prostate surgery for BPH which is done through the urethra. TURP stands for transurethral resection of the prostate. It involves cutting away chips from the prostate. It is done by removing chips so that they can come out through the penis.   Techniques using heat, microwave and laser to remove the prostate blockage are also being used.  HOME CARE INSTRUCTIONS   Give yourself time when you urinate.   Stay away from alcohol.   Beverages containing caffeine such as coffee, tea and colas can make the problems worse.   Decongestants, antihistamines, and some prescription medicines can also make the problem worse.   Follow up with your caregiver for further treatment as recommended.    SEEK IMMEDIATE MEDICAL CARE IF:   You develop increased pain with urination or are unable to pass your water.   You develop severe abdominal pain, vomiting, a high fever, or fainting.   You develop back pain or blood in your urine.  MAKE  SURE YOU:   Understand these instructions.   Will watch your condition.   Will get help right away if you are not doing well or get worse.  Document Released: 07/20/2005 Document Revised: 07/09/2011 Document Reviewed: 03/25/2007 [facility] Patient Information 2012 [place].

## 2011-10-28 ENCOUNTER — Encounter (HOSPITAL_COMMUNITY): Payer: Self-pay | Admitting: Urology

## 2012-04-19 ENCOUNTER — Encounter: Payer: Self-pay | Admitting: Gastroenterology

## 2012-08-03 HISTORY — PX: PROSTATE SURGERY: SHX751

## 2012-11-28 ENCOUNTER — Encounter: Payer: Self-pay | Admitting: Gastroenterology

## 2012-12-05 ENCOUNTER — Ambulatory Visit (INDEPENDENT_AMBULATORY_CARE_PROVIDER_SITE_OTHER): Payer: Medicare Other | Admitting: Pulmonary Disease

## 2012-12-05 ENCOUNTER — Encounter: Payer: Self-pay | Admitting: Pulmonary Disease

## 2012-12-05 VITALS — BP 132/82 | HR 59 | Temp 99.4°F | Ht 67.0 in | Wt 229.2 lb

## 2012-12-05 DIAGNOSIS — G4733 Obstructive sleep apnea (adult) (pediatric): Secondary | ICD-10-CM | POA: Insufficient documentation

## 2012-12-05 NOTE — Patient Instructions (Addendum)
Will get you new supplies, and check the functioning of you cpap machine Will re-optimize your pressure with an autoset device.  This may be built into your machine, or may have to get a loaner for 2 weeks.  Will let you know the results once I receive your download.  Work on weight loss followup with me in one year if doing well.

## 2012-12-05 NOTE — Progress Notes (Signed)
Subjective:    Patient ID: [name], male    DOB: 1950/12/30, 62 y.o.   MRN: [id number]  HPI The patient is a 62 year old male who comes in today for management of obstructive sleep apnea.  He has not been seen since 1995, and was started on CPAP for significant obstructive sleep apnea.  Unfortunately, his studies are not available for my review.  The patient has been wearing CPAP completely compliantly, but now is well overdue for a mask and supplies.  The patient states he has not had any breakthrough snoring, and overall feels rested in the mornings upon arising.  However, he wonders if his pressure needs adjustment upward.  His weight has been neutral since his original study.  He reports only occasional daytime sleepiness, primarily while watching television after supper.  He denies any sleepiness with driving unless it is a very long trip.  His Epworth score today is 5.   Sleep Questionnaire What time do you typically go to bed?( Between what hours) 9-10p 9-10p at 1511 on 12/05/12 by Danella Maiers, CMA How long does it take you to fall asleep? 5-63mns 5-129ms at 1511 on 12/05/12 by AsDanella MaiersCMA How many times during the night do you wake up? 3 3 at 1511 on 12/05/12 by AsDanella MaiersCMA What time do you get out of bed to start your day? [id number] 5-6a at 1511 on 12/05/12 by AsDanella MaiersCMA Do you drive or operate heavy machinery in your occupation? No No at 1511 on 12/05/12 by AsDanella MaiersCMA How much has your weight changed (up or down) over the past two years? (In pounds) 10 lb (4.536 kg)10 lb (4.536 kg) varies +/- 10lbs at 1511 on 12/05/12 by AsDanella MaiersCMA Have you ever had a sleep study before? Yes Yes at 1511 on 12/05/12 by AsDanella MaiersCMA If yes, location of study? [place] at 1511 on 12/05/12 by AsIvy Lynnabe, CMA If yes, date of study? 1995, 2000 1995, 2000 at 1511 on 12/05/12 by AsDanella MaiersCMA Do you currently use CPAP?  Yes Yes at 1511 on 12/05/12 by AsIvy Lynnabe, CMA If so, what pressure? unsure of pressure. unsure of pressure. at 1511 on 12/05/12 by AsDanella MaiersCMA Do you wear oxygen at any time? No No at 1511 on 12/05/12 by AsIvy Lynnabe, CMA   Review of Systems  Constitutional: Negative for fever and unexpected weight change.  HENT: Negative for ear pain, nosebleeds, congestion, sore throat, rhinorrhea, sneezing, trouble swallowing, dental problem, postnasal drip and sinus pressure.   Eyes: Negative for redness and itching.  Respiratory: Negative for cough, chest tightness, shortness of breath and wheezing.   Cardiovascular: Negative for palpitations and leg swelling.  Gastrointestinal: Negative for nausea and vomiting.  Genitourinary: Negative for dysuria.  Musculoskeletal: Positive for joint swelling and arthralgias.  Skin: Negative for rash.  Neurological: Negative for headaches.  Hematological: Does not bruise/bleed easily.  Psychiatric/Behavioral: Negative for dysphoric mood. The patient is not nervous/anxious.        Objective:   Physical Exam Constitutional:  Obese male, no acute distress  HENT:  Nares patent without discharge  Oropharynx without exudate, palate mildly elongated, uvula trimmed.   Eyes:  Perrla, eomi, no scleral icterus  Neck:  No JVD, no TMG  Cardiovascular:  Normal rate, regular rhythm, no rubs or gallops.  No murmurs        Intact  distal pulses  Pulmonary :  Normal breath sounds, no stridor or respiratory distress   No rales, rhonchi, or wheezing  Abdominal:  Soft, nondistended, bowel sounds present.  No tenderness noted.   Musculoskeletal:  No lower extremity edema noted.  Lymph Nodes:  No cervical lymphadenopathy noted  Skin:  No cyanosis noted  Neurologic:  Alert, appropriate, moves all 4 extremities without obvious deficit.         Assessment & Plan:

## 2012-12-05 NOTE — Assessment & Plan Note (Signed)
The patient has a long history of significant obstructive sleep apnea, and overall has been doing well with CPAP.  He is now overdue for a mask and supplies, and I suspect that we need to optimize his pressure again given his history.  I have asked him to work more aggressively on weight loss, and that he needs to see me on a yearly basis.  I will let him know his optimal pressure once I receive his download from the automatic device.

## 2012-12-06 ENCOUNTER — Encounter: Payer: Self-pay | Admitting: Pulmonary Disease

## 2012-12-09 ENCOUNTER — Encounter: Payer: Self-pay | Admitting: Pulmonary Disease

## 2013-01-17 ENCOUNTER — Telehealth: Payer: Self-pay | Admitting: Pulmonary Disease

## 2013-01-17 NOTE — Telephone Encounter (Signed)
[place], please advise on CPAP download results thanks!

## 2013-01-23 ENCOUNTER — Other Ambulatory Visit: Payer: Self-pay | Admitting: Pulmonary Disease

## 2013-01-23 DIAGNOSIS — G4733 Obstructive sleep apnea (adult) (pediatric): Secondary | ICD-10-CM

## 2013-01-23 NOTE — Telephone Encounter (Signed)
Called spoke with patient, advised of CPAP DL results / recs as stated by [facility] below.  Pt verbalized his understanding and denied any questions.  Will sign off.

## 2013-01-23 NOTE — Telephone Encounter (Signed)
Please let pt know that his optimal pressure is 16.  He also had a little more mask leak than I would like to see>>have him work on fit with dme.  I will send an order to dme to set his machine on 16.

## 2013-03-03 HISTORY — PX: OTHER SURGICAL HISTORY: SHX169

## 2013-04-19 ENCOUNTER — Other Ambulatory Visit: Payer: Self-pay | Admitting: Gastroenterology

## 2013-10-26 ENCOUNTER — Other Ambulatory Visit: Payer: Self-pay | Admitting: Orthopedic Surgery

## 2013-11-02 ENCOUNTER — Encounter: Payer: Self-pay | Admitting: *Deleted

## 2013-11-02 ENCOUNTER — Encounter (HOSPITAL_COMMUNITY): Payer: Self-pay | Admitting: Pharmacy Technician

## 2013-11-08 ENCOUNTER — Other Ambulatory Visit (HOSPITAL_COMMUNITY): Payer: Self-pay | Admitting: Orthopedic Surgery

## 2013-11-08 NOTE — Progress Notes (Signed)
STRESS TEST [facility] 11-11-11 ON CHART LOV DR [facility]5-5-14 EPIC

## 2013-11-08 NOTE — Patient Instructions (Addendum)
20 [name]  11/08/2013   Your procedure is scheduled on: Friday April 17th, 2015  Report to [place] at 515  AM.  Call this number if you have problems the morning of surgery [phone]   Remember: BRING CPAP MASK AND TUBING  Do not eat food or drink liquids :After Midnight.     Take these medicines the morning of surgery with A SIP OF WATER: systane eye drop, fentanyl patch                               You may not have any metal on your body including hair pins and piercings  Do not wear jewelry, make-up, lotions, powders, or deodorant.   Men may shave face and neck.  Do not bring valuables to the hospital. [place].  Contacts, dentures or bridgework may not be worn into surgery.  Leave suitcase in the car. After surgery it may be brought to your room.  For patients admitted to the hospital, checkout time is 11:00 AM the day of discharge.    [place] - Preparing for Surgery Before surgery, you can play an important role.  Because skin is not sterile, your skin needs to be as free of germs as possible.  You can reduce the number of germs on your skin by washing with CHG (chlorahexidine gluconate) soap before surgery.  CHG is an antiseptic cleaner which kills germs and bonds with the skin to continue killing germs even after washing. Please DO NOT use if you have an allergy to CHG or antibacterial soaps.  If your skin becomes reddened/irritated stop using the CHG and inform your nurse when you arrive at Short Stay. Do not shave (including legs and underarms) for at least 48 hours prior to the first CHG shower.  You may shave your face. Please follow these instructions carefully:  1.  Shower with CHG Soap the night before surgery and the  morning of Surgery.  2.  If you choose to wash your hair, wash your hair first as usual with your  normal  shampoo.  3.  After you shampoo, rinse your hair and body thoroughly to remove  the  shampoo.                           4.  Use CHG as you would any other liquid soap.  You can apply chg directly  to the skin and wash                       Gently with a scrungie or clean washcloth.  5.  Apply the CHG Soap to your body ONLY FROM THE NECK DOWN.   Do not use on open                           Wound or open sores. Avoid contact with eyes, ears mouth and genitals (private parts).                        Genitals (private parts) with your normal soap.             6.  Wash thoroughly, paying special attention to the area where your surgery  will be performed.  7.  Thoroughly rinse your  body with warm water from the neck down.  8.  DO NOT shower/wash with your normal soap after using and rinsing off  the CHG Soap.                9.  Pat yourself dry with a clean towel.            10.  Wear clean pajamas.            11.  Place clean sheets on your bed the night of your first shower and do not  sleep with pets. Day of Surgery : Do not apply any lotions/deodorants the morning of surgery.  Please wear clean clothes to the hospital/surgery center.  FAILURE TO FOLLOW THESE INSTRUCTIONS MAY RESULT IN THE CANCELLATION OF YOUR SURGERY PATIENT SIGNATURE_________________________________  NURSE SIGNATURE__________________________________  Incentive Spirometer  An incentive spirometer is a tool that can help keep your lungs clear and active. This tool measures how well you are filling your lungs with each breath. Taking long deep breaths may help reverse or decrease the chance of developing breathing (pulmonary) problems (especially infection) following:  A long period of time when you are unable to move or be active. BEFORE THE PROCEDURE   If the spirometer includes an indicator to show your best effort, your nurse or respiratory therapist will set it to a desired goal.  If possible, sit up straight or lean slightly forward. Try not to slouch.  Hold the incentive spirometer in an  upright position. INSTRUCTIONS FOR USE  1. Sit on the edge of your bed if possible, or sit up as far as you can in bed or on a chair. 2. Hold the incentive spirometer in an upright position. 3. Breathe out normally. 4. Place the mouthpiece in your mouth and seal your lips tightly around it. 5. Breathe in slowly and as deeply as possible, raising the piston or the ball toward the top of the column. 6. Hold your breath for 3-5 seconds or for as long as possible. Allow the piston or ball to fall to the bottom of the column. 7. Remove the mouthpiece from your mouth and breathe out normally. 8. Rest for a few seconds and repeat Steps 1 through 7 at least 10 times every 1-2 hours when you are awake. Take your time and take a few normal breaths between deep breaths. 9. The spirometer may include an indicator to show your best effort. Use the indicator as a goal to work toward during each repetition. 10. After each set of 10 deep breaths, practice coughing to be sure your lungs are clear. If you have an incision (the cut made at the time of surgery), support your incision when coughing by placing a pillow or rolled up towels firmly against it. Once you are able to get out of bed, walk around indoors and cough well. You may stop using the incentive spirometer when instructed by your caregiver.  RISKS AND COMPLICATIONS  Take your time so you do not get dizzy or light-headed.  If you are in pain, you may need to take or ask for pain medication before doing incentive spirometry. It is harder to take a deep breath if you are having pain. AFTER USE  Rest and breathe slowly and easily.  It can be helpful to keep track of a log of your progress. Your caregiver can provide you with a simple table to help with this. If you are using the spirometer at home, follow these instructions: SEEK  MEDICAL CARE IF:   You are having difficultly using the spirometer.  You have trouble using the spirometer as often as  instructed.  Your pain medication is not giving enough relief while using the spirometer.  You develop fever of 100.5 F (38.1 C) or higher. SEEK IMMEDIATE MEDICAL CARE IF:   You cough up bloody sputum that had not been present before.  You develop fever of 102 F (38.9 C) or greater.  You develop worsening pain at or near the incision site. MAKE SURE YOU:   Understand these instructions.  Will watch your condition.  Will get help right away if you are not doing well or get worse. Document Released: 11/30/2006 Document Revised: 10/12/2011 Document Reviewed: 01/31/2007 [facility] Patient Information 2014 [place], [place].   WHAT IS A BLOOD TRANSFUSION? Blood Transfusion Information  A transfusion is the replacement of blood or some of its parts. Blood is made up of multiple cells which provide different functions.  Red blood cells carry oxygen and are used for blood loss replacement.  White blood cells fight against infection.  Platelets control bleeding.  Plasma helps clot blood.  Other blood products are available for specialized needs, such as hemophilia or other clotting disorders. BEFORE THE TRANSFUSION  Who gives blood for transfusions?   Healthy volunteers who are fully evaluated to make sure their blood is safe. This is blood bank blood. Transfusion therapy is the safest it has ever been in the practice of medicine. Before blood is taken from a donor, a complete history is taken to make sure that person has no history of diseases nor engages in risky social behavior (examples are intravenous drug use or sexual activity with multiple partners). The donor's travel history is screened to minimize risk of transmitting infections, such as malaria. The donated blood is tested for signs of infectious diseases, such as HIV and hepatitis. The blood is then tested to be sure it is compatible with you in order to minimize the chance of a transfusion reaction. If you or a relative  donates blood, this is often done in anticipation of surgery and is not appropriate for emergency situations. It takes many days to process the donated blood. RISKS AND COMPLICATIONS Although transfusion therapy is very safe and saves many lives, the main dangers of transfusion include:   Getting an infectious disease.  Developing a transfusion reaction. This is an allergic reaction to something in the blood you were given. Every precaution is taken to prevent this. The decision to have a blood transfusion has been considered carefully by your caregiver before blood is given. Blood is not given unless the benefits outweigh the risks. AFTER THE TRANSFUSION  Right after receiving a blood transfusion, you will usually feel much better and more energetic. This is especially true if your red blood cells have gotten low (anemic). The transfusion raises the level of the red blood cells which carry oxygen, and this usually causes an energy increase.  The nurse administering the transfusion will monitor you carefully for complications. HOME CARE INSTRUCTIONS  No special instructions are needed after a transfusion. You may find your energy is better. Speak with your caregiver about any limitations on activity for underlying diseases you may have. SEEK MEDICAL CARE IF:   Your condition is not improving after your transfusion.  You develop redness or irritation at the intravenous (IV) site. SEEK IMMEDIATE MEDICAL CARE IF:  Any of the following symptoms occur over the next 12 hours:  Shaking chills.  You  have a temperature by mouth above 102 F (38.9 C), not controlled by medicine.  Chest, back, or muscle pain.  People around you feel you are not acting correctly or are confused.  Shortness of breath or difficulty breathing.  Dizziness and fainting.  You get a rash or develop hives.  You have a decrease in urine output.  Your urine turns a dark color or changes to pink, red, or brown. Any  of the following symptoms occur over the next 10 days:  You have a temperature by mouth above 102 F (38.9 C), not controlled by medicine.  Shortness of breath.  Weakness after normal activity.  The white part of the eye turns yellow (jaundice).  You have a decrease in the amount of urine or are urinating less often.  Your urine turns a dark color or changes to pink, red, or brown. Document Released: 07/17/2000 Document Revised: 10/12/2011 Document Reviewed: 03/05/2008 [facility] Patient Information 2014 [place].

## 2013-11-09 ENCOUNTER — Encounter (HOSPITAL_COMMUNITY)
Admission: RE | Admit: 2013-11-09 | Discharge: 2013-11-09 | Disposition: A | Discharge status: Home / Self Care | Payer: Medicare Other | Source: Ambulatory Visit | Attending: Orthopedic Surgery | Admitting: Orthopedic Surgery

## 2013-11-09 ENCOUNTER — Encounter (HOSPITAL_COMMUNITY): Payer: Self-pay

## 2013-11-09 DIAGNOSIS — Z01812 Encounter for preprocedural laboratory examination: Secondary | ICD-10-CM | POA: Insufficient documentation

## 2013-11-09 HISTORY — DX: Unspecified glaucoma: H40.9

## 2013-11-09 HISTORY — DX: Low back pain, unspecified: M54.50

## 2013-11-09 HISTORY — DX: Diverticulosis of intestine, part unspecified, without perforation or abscess without bleeding: K57.90

## 2013-11-09 HISTORY — DX: Personal history of urinary calculi: Z87.442

## 2013-11-09 HISTORY — DX: Personal history of other diseases of the digestive system: Z87.19

## 2013-11-09 HISTORY — DX: Unspecified hemorrhoids: K64.9

## 2013-11-09 HISTORY — DX: Ulcerative colitis, unspecified, without complications: K51.90

## 2013-11-09 HISTORY — DX: Low back pain: M54.5

## 2013-11-09 LAB — APTT: aPTT: 30 seconds (ref 24–37)

## 2013-11-09 LAB — CBC
HCT: 42.7 % (ref 39.0–52.0)
HEMOGLOBIN: 15.2 g/dL (ref 13.0–17.0)
MCH: 31.8 pg (ref 26.0–34.0)
MCHC: 35.6 g/dL (ref 30.0–36.0)
MCV: 89.3 fL (ref 78.0–100.0)
Platelets: 165 10*3/uL (ref 150–400)
RBC: 4.78 MIL/uL (ref 4.22–5.81)
RDW: 12.7 % (ref 11.5–15.5)
WBC: 5.5 10*3/uL (ref 4.0–10.5)

## 2013-11-09 LAB — URINALYSIS, ROUTINE W REFLEX MICROSCOPIC
Bilirubin Urine: NEGATIVE
Glucose, UA: NEGATIVE mg/dL
Hgb urine dipstick: NEGATIVE
KETONES UR: NEGATIVE mg/dL
Leukocytes, UA: NEGATIVE
NITRITE: NEGATIVE
PROTEIN: NEGATIVE mg/dL
Specific Gravity, Urine: 1.008 (ref 1.005–1.030)
Urobilinogen, UA: 0.2 mg/dL (ref 0.0–1.0)
pH: 5 (ref 5.0–8.0)

## 2013-11-09 LAB — SURGICAL PCR SCREEN
MRSA, PCR: NEGATIVE
Staphylococcus aureus: NEGATIVE

## 2013-11-09 LAB — COMPREHENSIVE METABOLIC PANEL
ALT: 31 U/L (ref 0–53)
AST: 27 U/L (ref 0–37)
Albumin: 4.3 g/dL (ref 3.5–5.2)
Alkaline Phosphatase: 84 U/L (ref 39–117)
BUN: 14 mg/dL (ref 6–23)
CO2: 28 meq/L (ref 19–32)
CREATININE: 0.96 mg/dL (ref 0.50–1.35)
Calcium: 9.7 mg/dL (ref 8.4–10.5)
Chloride: 102 mEq/L (ref 96–112)
GFR calc Af Amer: >90 mL/min (ref >90)
GFR, EST NON AFRICAN AMERICAN: 86 mL/min — AB (ref >90)
GLUCOSE: 105 mg/dL — AB (ref 70–99)
Potassium: 4.6 mEq/L (ref 3.7–5.3)
SODIUM: 141 meq/L (ref 137–147)
TOTAL PROTEIN: 7.4 g/dL (ref 6.0–8.3)
Total Bilirubin: 0.4 mg/dL (ref 0.3–1.2)

## 2013-11-09 LAB — PROTIME-INR
INR: 0.98 (ref 0.00–1.49)
PROTHROMBIN TIME: 12.8 s (ref 11.6–15.2)

## 2013-11-12 ENCOUNTER — Encounter: Payer: Self-pay | Admitting: *Deleted

## 2013-11-16 ENCOUNTER — Encounter: Payer: Self-pay | Admitting: Family Medicine

## 2013-11-16 ENCOUNTER — Ambulatory Visit (INDEPENDENT_AMBULATORY_CARE_PROVIDER_SITE_OTHER): Payer: 59 | Admitting: Family Medicine

## 2013-11-16 ENCOUNTER — Other Ambulatory Visit: Payer: Self-pay | Admitting: Orthopedic Surgery

## 2013-11-16 VITALS — BP 130/78 | HR 80 | Temp 97.2°F | Resp 18 | Ht 67.0 in | Wt 228.0 lb

## 2013-11-16 DIAGNOSIS — Z86718 Personal history of other venous thrombosis and embolism: Secondary | ICD-10-CM

## 2013-11-16 DIAGNOSIS — F32A Depression, unspecified: Secondary | ICD-10-CM

## 2013-11-16 DIAGNOSIS — C44621 Squamous cell carcinoma of skin of unspecified upper limb, including shoulder: Secondary | ICD-10-CM

## 2013-11-16 DIAGNOSIS — F329 Major depressive disorder, single episode, unspecified: Secondary | ICD-10-CM

## 2013-11-16 DIAGNOSIS — F3289 Other specified depressive episodes: Secondary | ICD-10-CM

## 2013-11-16 NOTE — H&P (Signed)
[name]. [name] DOB: 31-Dec-1950 Single / Language: [name] / Race: White Male  Date of Admission:  11-17-2013  Chief complaint:  Failed Right Total Knee  History of Present Illness The patient is a 63 year old male who comes in for a preoperative History and Physical. The patient is scheduled for a right total knee arthroplasty (revision) to be performed by Dr. Dione Plover. Aluisio, MD at [facility] on 11-17-2013. The patient is a 63 year old male presenting for check of his knee. The patient comes in 12 years (and 6 months) out from left total knee arthroplasty (and the right knee was replaced by Dr. Collie Siad in 1993). The patient states that he is doing fair but the right knee is not doing that well at this time. The pain is under fair control at this time and describe their pain as mild to moderate. The right knee has been swollen with fluid and he has had some instability. He has been having trouble with his right knee for the past 3-4 years but it has been more problematic recently. He is having instability in the knee as well. He has been getting cramps in his calf. He states the last time he felt like that, he had a DVT.He is not having any issues with the left knee at this time. [name] has been having a lot of issues with his right knee. He had this replaced approximately 21 years ago. Recently, he has developed swelling and an unstable feeling in that knee. He has not had issues with the left knee. I replaced that one 12 1/2 years ago. He feels that one is doing well. Plan is to change out the polyethylene liner versus revision of the knee replacement. They have been treated conservatively in the past for the above stated problem and despite conservative measures, they continue to have progressive pain and severe functional limitations and dysfunction. They have failed non-operative management. It is felt that they would benefit from undergoing revision of the total joint  replacement. Risks and benefits of the procedure have been discussed with the patient and they elect to proceed with surgery. There are no active contraindications to surgery such as ongoing infection or rapidly progressive neurological disease.  Problem List/Past Medical S/P total knee replacement (V43.65)   Allergies Tape 1"X5yd *MEDICAL DEVICES*. Adhesive **Patient IS ABLE to use paper tape and steristrips though**    Family History Diabetes Mellitus. father and sister Heart Disease. First Degree Relatives. mother Osteoporosis. mother Cancer. father Hypertension. mother and father  Social History Pain Contract. yes Current work status. disabled Drug/Alcohol Rehab (Currently). no Living situation. live with partner Number of flights of stairs before winded. greater than 5 Children. 1 Alcohol use. Occasional alcohol use, Drinks beer. current drinker; drinks beer; less than 5 per week Illicit drug use. no Marital status. single Tobacco / smoke exposure. yes Exercise. Exercises daily; does running / walking Tobacco use. Former smoker. former smoker; smoke(d) 1 pack(s) per day Previously in rehab. no Post-Surgical Plans. HOME    Medication History FentaNYL (25MCG/HR Patch 72HR, Transdermal) Active. Sertraline HCl (50MG Tablet, 1 Oral one po q day, Taken starting 08/29/2013) Active. TraZODone HCl (50MG Tablet, 1 (one) Tablet Oral QHS, Taken starting 10/13/2013) Active. Fluticasone Propionate (50MCG/ACT Suspension, Nasal) Active. Travatan (0.004% Solution, Ophthalmic) Active. Systane (0.4-0.3% Solution, Ophthalmic) Active. Calcium Citrate ( Oral) Specific dose unknown - Active. Multivitamin ( Oral) Active. Fish Oil Burp-Less ( Oral) Specific dose unknown - Active. Medications Reconciled.  Past Surgical History Gallbladder Surgery. laporoscopic Spinal Fusion. neck Carpal Tunnel Repair. bilateral Other Surgery. ACID REFLUX SURGERY/  CARLEDGE REMOVED FROM BIL KNEES. Appendectomy Tonsillectomy Total Knee Replacement. bilateral   Past Medical History Sleep Apnea. 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 02/25/2005 Impaired Vision Impaired Hearing Hiatal Hernia Gastric Ulcer Hemorrhoids Irritable bowel syndrome Diverticulosis Ulcerative Colitis Colonic Polyps Degenerative Disc Disease Measles Mumps Kidney Stone DVT    Review of Systems General:Not Present- Chills, Fever, Night Sweats, Fatigue, Weight Gain, Weight Loss and Memory Loss. Skin:Not Present- Hives, Itching, Rash, Eczema and Lesions. HEENT:Present- Hearing Loss. Not Present- Tinnitus, Headache, Double Vision, Visual Loss and Dentures. Respiratory:Not Present- Shortness of breath with exertion, Shortness of breath at rest, Allergies, Coughing up blood and Chronic Cough. Cardiovascular:Not Present- Chest Pain, Racing/skipping heartbeats, Difficulty Breathing Lying Down, Murmur, Swelling and Palpitations. Gastrointestinal:Not Present- Bloody Stool, Heartburn, Abdominal Pain, Vomiting, Nausea, Constipation, Diarrhea, Difficulty Swallowing, Jaundice and Loss of appetitie. Male Genitourinary:Not Present- Urinary frequency, Blood in Urine, Weak urinary stream, Discharge, Flank Pain, Incontinence, Painful Urination, Urgency, Urinary Retention and Urinating at Night. Musculoskeletal:Present- Joint Swelling, Joint Pain, Back Pain and Morning Stiffness. Not Present- Muscle Weakness, Muscle Pain and Spasms. Neurological:Not Present- Tremor, Dizziness, Blackout spells, Paralysis, Difficulty with balance and Weakness. Psychiatric:Not Present- Insomnia.    VitalsAM Weight: 220 lb Height: 67 in Weight was reported by patient. Height was reported by patient. Body Surface Area: 2.17 m Body Mass Index: 34.46 kg/m Pulse: 64 (Regular) Resp.: 14 (Unlabored) BP: 142/74 (Sitting, Left Arm,  Standard)     Physical Exam The physical exam findings are as follows:   General Mental Status - Alert, cooperative and good historian. General Appearance- pleasant. Not in acute distress. Orientation- Oriented X3. Build & Nutrition- Well nourished and Well developed.   Head and Neck Head- normocephalic, atraumatic . Neck Global Assessment- supple. no bruit auscultated on the right and no bruit auscultated on the left.   Eye Vision- Wears corrective lenses. Pupil- Bilateral- Regular and Round. Motion- Bilateral- EOMI.   Chest and Lung Exam Auscultation: Breath sounds:- clear at anterior chest wall and - clear at posterior chest wall. Adventitious sounds:- No Adventitious sounds.   Cardiovascular Auscultation:Rhythm- Regular rate and rhythm. Heart Sounds- S1 WNL and S2 WNL. Murmurs & Other Heart Sounds:Auscultation of the heart reveals - No Murmurs.   Abdomen Inspection:Contour- Generalized mild distention. Palpation/Percussion:Tenderness- Abdomen is non-tender to palpation. Rigidity (guarding)- Abdomen is soft. Auscultation:Auscultation of the abdomen reveals - Bowel sounds normal.   Male Genitourinary   Note: Not done, not pertinent to present illness  Musculoskeletal   Note: He is alert and oriented. No apparent distress. The right knee does show an effusion. There is no warmth about the knee. He is in slight varus now. His range of motion is about 5-120. He does have some slight varus , valgus and anterior/posterior laxity to the knee. There is no gross instability. The left knee shows no effusion. Range is 0-120. No tenderness or instability.  RADIOGRAPHS: AP and lateral of both knees, show that he has an old osteonic prosthesis in the right knee. The polyethylene was completely worn. He has some cystic changes behind the femoral component but no definitive loosening. The left knee has a Chartered certified accountant  prosthesis, in excellent position with no periprosthetic abnormalities.   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 Wynelle Link.  Plan is to go home.  PCP - Dr. Hulan Fess  The patient will not receive TXA (tranexamic acid) due to: DVT Right Leg  Signed electronically by Joelene Millin, III PA-C

## 2013-11-16 NOTE — Progress Notes (Signed)
Subjective:    Patient ID: [name], male    DOB: 09-07-1950, 63 y.o.   MRN: [id number]  HPI Patient is a very pleasant 63 year old white male who is here today to establish care. He has a past medical history of gastroesophageal reflux disease, colon polyps, history of colitis, obstructive sleep apnea. He had numerous orthopedic surgeries including knee replacement, low back surgery, fusion of the wrist, fusion of the cervical spine. He is scheduled for a repeat right knee replacement tomorrow. He also is scheduled for a resection of his left third digit due to squamous cell carcinoma. He has a significant past medical history of a DVT without a precipitating cause. He also has a family history of DVTs in his mother. His last colonoscopy was one year ago and is up to date. He had his prostate checked annually by his urologist. He has had Pneumovax 23 and is not due again until after 30. He had his tetanus vaccine as well as his shingles vaccine. The remainder of his preventive care is up to date. I reviewed his CMP and CBC he had preoperatively which were normal aside from a mildly elevated blood sugar. He is due for a fasting lipid panel at his complete physical exam after August.  He currently takes 70 over chronic low back pain and joint pain which is described by his orthopedist Dr. Nelva Bush. Past Medical History  Diagnosis Date  . Back pain, chronic     pt states he has 3 herniated disks--uses fentanyl patch for pain  . DVT (deep venous thrombosis) 2001 or 2002    right leg-required hospitalization x 8 days  . BPH (benign prostatic hyperplasia)     frequent urination, not able to hold urine well  . Arthritis     oa and ddd  . Colon polyps   . Blood transfusion without reported diagnosis 1950    rH negative  . Ulcer 1966  . Hyperlipidemia 2012  . DVT (deep venous thrombosis) 1996    right  . Sleep apnea     pt uses cpap - setting of 16  . GERD (gastroesophageal reflux disease)   .  Lumbago   . Glaucoma     both eyes  . H/O hiatal hernia   . History of IBS   . Diverticulosis   . Ulcerative colitis   . History of kidney stones     x 1  . Hemorrhoid   . Complication of anesthesia     pt had spinal for a knee replacement--"woke up" during surgery--and sick after surgery  . 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 Right wrist fusion 12/12 - pt wearing brace on the wrist-not started physical therapy yet    . Cervical fusion 2011--pt has good range of motion neck    . Bilateral carpal tunnel release 2009    . Back surgery  2008     bone spur removed   . Joint replacement      2001 left total knee and 1985 right total knee  . Nissen fundoplication 9675    . Cholecystectomy  1994  . Appendectomy  1993  . Transurethral resection of prostate  10/09/2011    Procedure: TRANSURETHRAL RESECTION OF THE PROSTATE WITH GYRUS INSTRUMENTS;  Surgeon: Fredricka Bonine, MD;  Location: [facility];  Service: Urology;  Laterality: N/A;  . Prostate surgery  2014    TURP  .  Spine surgery  2009    cervical spine fusion  . Mass removed from 2nd finger Left aug 2014    squamous carcinoma  . Uvvvp     Current Outpatient Prescriptions on File Prior to Visit  Medication Sig Dispense Refill  . Calcium Carbonate-Vitamin D (CALCIUM + D PO) Take 1 tablet by mouth daily.      . fentaNYL (DURAGESIC - DOSED MCG/HR) 25 MCG/HR Place 1 patch onto the skin every 3 (three) days. For chronic back pain      . fish oil-omega-3 fatty acids 1000 MG capsule Take 3 g by mouth daily.       . fluticasone (FLONASE) 50 MCG/ACT nasal spray Place 1 spray into the nose every evening.       . Multiple Vitamin (MULITIVITAMIN WITH MINERALS) TABS Take 1 tablet by mouth daily.      Marland Kitchen Propylene Glycol (SYSTANE BALANCE) 0.6 % SOLN Place 1 drop into both eyes 2 (two) times daily.      . sertraline (ZOLOFT) 50 MG tablet Take 50 mg by mouth daily after  breakfast.      . Travoprost, BAK Free, (TRAVATAN) 0.004 % SOLN ophthalmic solution Place 1 drop into both eyes at bedtime.      . traZODone (DESYREL) 50 MG tablet Take 1 tablet by mouth at bedtime as needed.        No current facility-administered medications on file prior to visit.   Allergies  Allergen Reactions  . Other     Pt allergic to some tapes--pt request that we only use paper tape!!   History   Social History  . Marital Status: Single    Spouse Name: N/A    Number of Children: N/A  . Years of Education: N/A   Occupational History  . disabled    Social History Main Topics  . Smoking status: Former Smoker -- 1.00 packs/day for 19 years    Types: Cigarettes    Quit date: 08/04/1983  . Smokeless tobacco: Never Used     Comment: quit smoking 1985  . Alcohol Use: 3.6 oz/week    6 Cans of beer per week     Comment: maybe 2 or 3 beers per week  . Drug Use: No  . Sexual Activity: Not Currently   Other Topics Concern  . Not on file   Social History Narrative  . No narrative on file   Family History  Problem Relation Age of Onset  . Colon cancer Father   . Bone cancer Father     deceased  . Cancer Father   . Diabetes Father   . Arthritis Mother   . Hearing loss Mother   . Hyperlipidemia Mother   . Hypertension Mother   . Deep vein thrombosis Mother   . Diabetes Sister   . Hearing loss Maternal Grandmother   . Diabetes Paternal Grandfather       Review of Systems  All other systems reviewed and are negative.      Objective:   Physical Exam  Vitals reviewed. Constitutional: He is oriented to person, place, and time. He appears well-developed and well-nourished. No distress.  HENT:  Right Ear: External ear normal.  Left Ear: External ear normal.  Nose: Nose normal.  Mouth/Throat: Oropharynx is clear and moist. No oropharyngeal exudate.  Eyes: Conjunctivae and EOM are normal. Pupils are equal, round, and reactive to light. No scleral icterus.    Neck: Neck supple. No JVD present. No thyromegaly present.  Cardiovascular: Normal rate, regular rhythm and normal heart sounds.   No murmur heard. Pulmonary/Chest: Effort normal and breath sounds normal. No respiratory distress. He has no wheezes. He has no rales. He exhibits no tenderness.  Abdominal: Soft. Bowel sounds are normal. He exhibits no distension. There is no tenderness. There is no rebound and no guarding.  Musculoskeletal: He exhibits no edema.  Lymphadenopathy:    He has no cervical adenopathy.  Neurological: He is alert and oriented to person, place, and time. He has normal reflexes. He displays normal reflexes. No cranial nerve deficit. He exhibits normal muscle tone. Coordination normal.  Skin: No rash noted. He is not diaphoretic.  Psychiatric: He has a normal mood and affect. His behavior is normal. Judgment and thought content normal.          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 He does not require any further refills. I recommended 820 when he turns 50. I recommended a blister on Pneumovax 23 via 66. Otherwise his cancer screening is up-to-date with colonoscopy and PSA. He is due for a fasting lipid panel in August. I recommended a complete physical exam in August. I consider obtaining a chest x-ray given his history of smoking cessation. Otherwise the patient is doing well and stable on his chronic medication. I will glad to refill his medications when it is necessary.

## 2013-11-17 ENCOUNTER — Inpatient Hospital Stay (HOSPITAL_COMMUNITY): Payer: Medicare Other | Admitting: Anesthesiology

## 2013-11-17 ENCOUNTER — Inpatient Hospital Stay (HOSPITAL_COMMUNITY)
Admission: RE | Admit: 2013-11-17 | Discharge: 2013-11-18 | DRG: 489 | Disposition: A | Discharge status: Home Health | Payer: Medicare Other | Source: Ambulatory Visit | Attending: Orthopedic Surgery | Admitting: Orthopedic Surgery

## 2013-11-17 ENCOUNTER — Encounter (HOSPITAL_COMMUNITY): Payer: Medicare Other | Admitting: Anesthesiology

## 2013-11-17 ENCOUNTER — Encounter (HOSPITAL_COMMUNITY): Payer: Self-pay | Admitting: *Deleted

## 2013-11-17 ENCOUNTER — Encounter (HOSPITAL_COMMUNITY)
Admission: RE | Disposition: A | Discharge status: Home Health | Payer: Self-pay | Source: Ambulatory Visit | Attending: Orthopedic Surgery

## 2013-11-17 DIAGNOSIS — K219 Gastro-esophageal reflux disease without esophagitis: Secondary | ICD-10-CM | POA: Diagnosis present

## 2013-11-17 DIAGNOSIS — K589 Irritable bowel syndrome without diarrhea: Secondary | ICD-10-CM | POA: Diagnosis present

## 2013-11-17 DIAGNOSIS — G8929 Other chronic pain: Secondary | ICD-10-CM | POA: Diagnosis present

## 2013-11-17 DIAGNOSIS — T84099A Other mechanical complication of unspecified internal joint prosthesis, initial encounter: Principal | ICD-10-CM | POA: Diagnosis present

## 2013-11-17 DIAGNOSIS — M179 Osteoarthritis of knee, unspecified: Secondary | ICD-10-CM | POA: Diagnosis present

## 2013-11-17 DIAGNOSIS — Z981 Arthrodesis status: Secondary | ICD-10-CM

## 2013-11-17 DIAGNOSIS — Z87891 Personal history of nicotine dependence: Secondary | ICD-10-CM

## 2013-11-17 DIAGNOSIS — E785 Hyperlipidemia, unspecified: Secondary | ICD-10-CM | POA: Diagnosis present

## 2013-11-17 DIAGNOSIS — H409 Unspecified glaucoma: Secondary | ICD-10-CM | POA: Diagnosis present

## 2013-11-17 DIAGNOSIS — Z86718 Personal history of other venous thrombosis and embolism: Secondary | ICD-10-CM

## 2013-11-17 DIAGNOSIS — M1710 Unilateral primary osteoarthritis, unspecified knee: Secondary | ICD-10-CM | POA: Diagnosis present

## 2013-11-17 DIAGNOSIS — M545 Low back pain, unspecified: Secondary | ICD-10-CM | POA: Diagnosis present

## 2013-11-17 DIAGNOSIS — Y831 Surgical operation with implant of artificial internal device as the cause of abnormal reaction of the patient, or of later complication, without mention of misadventure at the time of the procedure: Secondary | ICD-10-CM | POA: Diagnosis present

## 2013-11-17 DIAGNOSIS — G4730 Sleep apnea, unspecified: Secondary | ICD-10-CM | POA: Diagnosis present

## 2013-11-17 DIAGNOSIS — Z01812 Encounter for preprocedural laboratory examination: Secondary | ICD-10-CM

## 2013-11-17 DIAGNOSIS — M659 Unspecified synovitis and tenosynovitis, unspecified site: Secondary | ICD-10-CM | POA: Diagnosis present

## 2013-11-17 DIAGNOSIS — Z8262 Family history of osteoporosis: Secondary | ICD-10-CM

## 2013-11-17 DIAGNOSIS — Z9109 Other allergy status, other than to drugs and biological substances: Secondary | ICD-10-CM

## 2013-11-17 DIAGNOSIS — Z96659 Presence of unspecified artificial knee joint: Secondary | ICD-10-CM

## 2013-11-17 DIAGNOSIS — M25569 Pain in unspecified knee: Secondary | ICD-10-CM | POA: Diagnosis present

## 2013-11-17 DIAGNOSIS — Z79899 Other long term (current) drug therapy: Secondary | ICD-10-CM

## 2013-11-17 DIAGNOSIS — H547 Unspecified visual loss: Secondary | ICD-10-CM | POA: Diagnosis present

## 2013-11-17 DIAGNOSIS — H9190 Unspecified hearing loss, unspecified ear: Secondary | ICD-10-CM | POA: Diagnosis present

## 2013-11-17 DIAGNOSIS — T84018A Broken internal joint prosthesis, other site, initial encounter: Secondary | ICD-10-CM

## 2013-11-17 HISTORY — PX: TOTAL KNEE REVISION: SHX996

## 2013-11-17 LAB — TYPE AND SCREEN
ABO/RH(D): O POS
ANTIBODY SCREEN: NEGATIVE

## 2013-11-17 LAB — ABO/RH: ABO/RH(D): O POS

## 2013-11-17 SURGERY — TOTAL KNEE REVISION
Anesthesia: General | Site: Knee | Laterality: Right

## 2013-11-17 MED ORDER — PROPOFOL 10 MG/ML IV BOLUS
INTRAVENOUS | Status: AC
  Filled 2013-11-17: qty 20

## 2013-11-17 MED ORDER — PHENOL 1.4 % MT LIQD
1.0000 | OROMUCOSAL | Status: DC | PRN
Start: 2013-11-17 — End: 2013-11-18
  Filled 2013-11-17: qty 177

## 2013-11-17 MED ORDER — ROCURONIUM BROMIDE 100 MG/10ML IV SOLN
INTRAVENOUS | Status: DC | PRN
  Administered 2013-11-17: 30 mg via INTRAVENOUS

## 2013-11-17 MED ORDER — ACETAMINOPHEN 10 MG/ML IV SOLN
1000.0000 mg | Freq: Once | INTRAVENOUS | Status: AC
  Administered 2013-11-17: 1000 mg via INTRAVENOUS
  Filled 2013-11-17 (×2): qty 100

## 2013-11-17 MED ORDER — SERTRALINE HCL 50 MG PO TABS
50.0000 mg | ORAL_TABLET | Freq: Every day | ORAL | Status: DC
  Administered 2013-11-17 – 2013-11-18 (×2): 50 mg via ORAL
  Filled 2013-11-17 (×2): qty 1

## 2013-11-17 MED ORDER — LACTATED RINGERS IV SOLN
INTRAVENOUS | Status: DC
Start: 2013-11-17 — End: 2013-11-17

## 2013-11-17 MED ORDER — MEPERIDINE HCL 50 MG/ML IJ SOLN: 6.2500 mg | INTRAMUSCULAR | Status: DC | PRN

## 2013-11-17 MED ORDER — SODIUM CHLORIDE 0.9 % IJ SOLN
INTRAMUSCULAR | Status: DC | PRN
  Administered 2013-11-17: 30 mL

## 2013-11-17 MED ORDER — LACTATED RINGERS IV SOLN
INTRAVENOUS | Status: DC | PRN
  Administered 2013-11-17 (×2): via INTRAVENOUS

## 2013-11-17 MED ORDER — FENTANYL CITRATE 0.05 MG/ML IJ SOLN
25.0000 ug | INTRAMUSCULAR | Status: DC | PRN
  Administered 2013-11-17 (×2): 50 ug via INTRAVENOUS

## 2013-11-17 MED ORDER — BUPIVACAINE LIPOSOME 1.3 % IJ SUSP
INTRAMUSCULAR | Status: DC | PRN
  Administered 2013-11-17: 20 mL

## 2013-11-17 MED ORDER — CEFAZOLIN SODIUM-DEXTROSE 2-3 GM-% IV SOLR
2.0000 g | INTRAVENOUS | Status: AC
  Administered 2013-11-17: 2 g via INTRAVENOUS

## 2013-11-17 MED ORDER — KETOROLAC TROMETHAMINE 15 MG/ML IJ SOLN
INTRAMUSCULAR | Status: AC
  Filled 2013-11-17: qty 1

## 2013-11-17 MED ORDER — NEOSTIGMINE METHYLSULFATE 1 MG/ML IJ SOLN
INTRAMUSCULAR | Status: AC
  Filled 2013-11-17: qty 10

## 2013-11-17 MED ORDER — MORPHINE SULFATE 2 MG/ML IJ SOLN
1.0000 mg | INTRAMUSCULAR | Status: DC | PRN
Start: 2013-11-17 — End: 2013-11-18

## 2013-11-17 MED ORDER — ACETAMINOPHEN 650 MG RE SUPP: 650.0000 mg | Freq: Four times a day (QID) | RECTAL | Status: DC | PRN

## 2013-11-17 MED ORDER — MIDAZOLAM HCL 5 MG/5ML IJ SOLN
INTRAMUSCULAR | Status: DC | PRN
  Administered 2013-11-17: 2 mg via INTRAVENOUS

## 2013-11-17 MED ORDER — GLYCOPYRROLATE 0.2 MG/ML IJ SOLN
INTRAMUSCULAR | Status: AC
  Filled 2013-11-17: qty 3

## 2013-11-17 MED ORDER — RIVAROXABAN 10 MG PO TABS
10.0000 mg | ORAL_TABLET | Freq: Every day | ORAL | Status: DC
  Administered 2013-11-18: 10 mg via ORAL
  Filled 2013-11-17 (×2): qty 1

## 2013-11-17 MED ORDER — FENTANYL CITRATE 0.05 MG/ML IJ SOLN
INTRAMUSCULAR | Status: AC
  Filled 2013-11-17: qty 5

## 2013-11-17 MED ORDER — ONDANSETRON HCL 4 MG/2ML IJ SOLN
4.0000 mg | Freq: Four times a day (QID) | INTRAMUSCULAR | Status: DC | PRN
Start: 2013-11-17 — End: 2013-11-18

## 2013-11-17 MED ORDER — SODIUM CHLORIDE 0.9 % IV SOLN
INTRAVENOUS | Status: DC
  Administered 2013-11-17 (×2): via INTRAVENOUS

## 2013-11-17 MED ORDER — METHOCARBAMOL 100 MG/ML IJ SOLN
500.0000 mg | Freq: Four times a day (QID) | INTRAVENOUS | Status: DC | PRN
  Administered 2013-11-17: 500 mg via INTRAVENOUS
  Filled 2013-11-17: qty 5

## 2013-11-17 MED ORDER — ACETAMINOPHEN 500 MG PO TABS
1000.0000 mg | ORAL_TABLET | Freq: Four times a day (QID) | ORAL | Status: DC
  Administered 2013-11-17 (×2): 1000 mg via ORAL
  Filled 2013-11-17 (×2): qty 2

## 2013-11-17 MED ORDER — SODIUM CHLORIDE 0.9 % IJ SOLN
INTRAMUSCULAR | Status: AC
  Filled 2013-11-17: qty 10

## 2013-11-17 MED ORDER — ACETAMINOPHEN 325 MG PO TABS: 650.0000 mg | ORAL_TABLET | Freq: Four times a day (QID) | ORAL | Status: DC | PRN

## 2013-11-17 MED ORDER — BUPIVACAINE-EPINEPHRINE (PF) 0.25% -1:200000 IJ SOLN
INTRAMUSCULAR | Status: DC | PRN
  Administered 2013-11-17: 20 mL

## 2013-11-17 MED ORDER — CHLORHEXIDINE GLUCONATE 4 % EX LIQD: 60.0000 mL | Freq: Once | CUTANEOUS | Status: DC

## 2013-11-17 MED ORDER — METHOCARBAMOL 500 MG PO TABS: 500.0000 mg | ORAL_TABLET | Freq: Four times a day (QID) | ORAL | Status: DC | PRN

## 2013-11-17 MED ORDER — FENTANYL 25 MCG/HR TD PT72
25.0000 ug | MEDICATED_PATCH | TRANSDERMAL | Status: DC
  Administered 2013-11-17: 25 ug via TRANSDERMAL
  Filled 2013-11-17: qty 1

## 2013-11-17 MED ORDER — BUPIVACAINE-EPINEPHRINE PF 0.25-1:200000 % IJ SOLN
INTRAMUSCULAR | Status: AC
  Filled 2013-11-17: qty 30

## 2013-11-17 MED ORDER — DEXAMETHASONE 6 MG PO TABS
10.0000 mg | ORAL_TABLET | Freq: Every day | ORAL | Status: AC
  Administered 2013-11-18: 10 mg via ORAL
  Filled 2013-11-17: qty 1

## 2013-11-17 MED ORDER — METOCLOPRAMIDE HCL 5 MG/ML IJ SOLN: 5.0000 mg | Freq: Three times a day (TID) | INTRAMUSCULAR | Status: DC | PRN

## 2013-11-17 MED ORDER — PROPOFOL 10 MG/ML IV BOLUS
INTRAVENOUS | Status: DC | PRN
  Administered 2013-11-17: 150 mg via INTRAVENOUS

## 2013-11-17 MED ORDER — MIDAZOLAM HCL 2 MG/2ML IJ SOLN
INTRAMUSCULAR | Status: AC
  Filled 2013-11-17: qty 2

## 2013-11-17 MED ORDER — FENTANYL CITRATE 0.05 MG/ML IJ SOLN
INTRAMUSCULAR | Status: AC
  Filled 2013-11-17: qty 2

## 2013-11-17 MED ORDER — BISACODYL 10 MG RE SUPP: 10.0000 mg | Freq: Every day | RECTAL | Status: DC | PRN

## 2013-11-17 MED ORDER — DEXAMETHASONE SODIUM PHOSPHATE 10 MG/ML IJ SOLN
10.0000 mg | Freq: Once | INTRAMUSCULAR | Status: AC
  Administered 2013-11-17: 10 mg via INTRAVENOUS

## 2013-11-17 MED ORDER — TRAZODONE HCL 50 MG PO TABS: 50.0000 mg | ORAL_TABLET | Freq: Every evening | ORAL | Status: DC | PRN

## 2013-11-17 MED ORDER — ONDANSETRON HCL 4 MG/2ML IJ SOLN
INTRAMUSCULAR | Status: DC | PRN
  Administered 2013-11-17: 4 mg via INTRAVENOUS

## 2013-11-17 MED ORDER — EPHEDRINE SULFATE 50 MG/ML IJ SOLN
INTRAMUSCULAR | Status: DC | PRN
  Administered 2013-11-17: 5 mg via INTRAVENOUS

## 2013-11-17 MED ORDER — POLYVINYL ALCOHOL 1.4 % OP SOLN
2.0000 [drp] | Freq: Two times a day (BID) | OPHTHALMIC | Status: DC
  Administered 2013-11-18: 2 [drp] via OPHTHALMIC
  Filled 2013-11-17: qty 15

## 2013-11-17 MED ORDER — NEOSTIGMINE METHYLSULFATE 1 MG/ML IJ SOLN
INTRAMUSCULAR | Status: DC | PRN
  Administered 2013-11-17: 4 mg via INTRAVENOUS

## 2013-11-17 MED ORDER — DEXAMETHASONE SODIUM PHOSPHATE 10 MG/ML IJ SOLN
10.0000 mg | Freq: Every day | INTRAMUSCULAR | Status: AC
  Filled 2013-11-17: qty 1

## 2013-11-17 MED ORDER — DIPHENHYDRAMINE HCL 12.5 MG/5ML PO ELIX: 12.5000 mg | ORAL_SOLUTION | ORAL | Status: DC | PRN

## 2013-11-17 MED ORDER — KETOROLAC TROMETHAMINE 15 MG/ML IJ SOLN
7.5000 mg | Freq: Four times a day (QID) | INTRAMUSCULAR | Status: DC | PRN
  Administered 2013-11-17: 7.5 mg via INTRAVENOUS

## 2013-11-17 MED ORDER — MENTHOL 3 MG MT LOZG
1.0000 | LOZENGE | OROMUCOSAL | Status: DC | PRN
  Filled 2013-11-17: qty 9

## 2013-11-17 MED ORDER — BUPIVACAINE LIPOSOME 1.3 % IJ SUSP
20.0000 mL | Freq: Once | INTRAMUSCULAR | Status: DC
  Filled 2013-11-17: qty 20

## 2013-11-17 MED ORDER — FENTANYL CITRATE 0.05 MG/ML IJ SOLN
INTRAMUSCULAR | Status: DC | PRN
  Administered 2013-11-17 (×4): 50 ug via INTRAVENOUS
  Administered 2013-11-17: 100 ug via INTRAVENOUS

## 2013-11-17 MED ORDER — ROCURONIUM BROMIDE 100 MG/10ML IV SOLN
INTRAVENOUS | Status: AC
  Filled 2013-11-17: qty 1

## 2013-11-17 MED ORDER — SODIUM CHLORIDE 0.9 % IV SOLN: INTRAVENOUS | Status: DC

## 2013-11-17 MED ORDER — DOCUSATE SODIUM 100 MG PO CAPS
100.0000 mg | ORAL_CAPSULE | Freq: Two times a day (BID) | ORAL | Status: DC
  Administered 2013-11-17 – 2013-11-18 (×3): 100 mg via ORAL

## 2013-11-17 MED ORDER — CEFAZOLIN SODIUM-DEXTROSE 2-3 GM-% IV SOLR
2.0000 g | Freq: Four times a day (QID) | INTRAVENOUS | Status: AC
  Administered 2013-11-17 (×2): 2 g via INTRAVENOUS
  Filled 2013-11-17 (×2): qty 50

## 2013-11-17 MED ORDER — FLEET ENEMA 7-19 GM/118ML RE ENEM: 1.0000 | ENEMA | Freq: Once | RECTAL | Status: AC | PRN

## 2013-11-17 MED ORDER — FLUTICASONE PROPIONATE 50 MCG/ACT NA SUSP
1.0000 | Freq: Every evening | NASAL | Status: DC
  Administered 2013-11-17: 1 via NASAL
  Filled 2013-11-17: qty 16

## 2013-11-17 MED ORDER — CEFAZOLIN SODIUM-DEXTROSE 2-3 GM-% IV SOLR
INTRAVENOUS | Status: AC
  Filled 2013-11-17: qty 50

## 2013-11-17 MED ORDER — PROPYLENE GLYCOL 0.6 % OP SOLN: 1.0000 [drp] | Freq: Two times a day (BID) | OPHTHALMIC | Status: DC

## 2013-11-17 MED ORDER — SUCCINYLCHOLINE CHLORIDE 20 MG/ML IJ SOLN
INTRAMUSCULAR | Status: DC | PRN
  Administered 2013-11-17: 100 mg via INTRAVENOUS

## 2013-11-17 MED ORDER — SODIUM CHLORIDE 0.9 % IJ SOLN
INTRAMUSCULAR | Status: AC
  Filled 2013-11-17: qty 50

## 2013-11-17 MED ORDER — EPHEDRINE SULFATE 50 MG/ML IJ SOLN
INTRAMUSCULAR | Status: AC
  Filled 2013-11-17: qty 1

## 2013-11-17 MED ORDER — ONDANSETRON HCL 4 MG/2ML IJ SOLN
INTRAMUSCULAR | Status: AC
  Filled 2013-11-17: qty 2

## 2013-11-17 MED ORDER — PROMETHAZINE HCL 25 MG/ML IJ SOLN: 6.2500 mg | INTRAMUSCULAR | Status: DC | PRN

## 2013-11-17 MED ORDER — DEXAMETHASONE SODIUM PHOSPHATE 10 MG/ML IJ SOLN
INTRAMUSCULAR | Status: AC
  Filled 2013-11-17: qty 1

## 2013-11-17 MED ORDER — METOCLOPRAMIDE HCL 10 MG PO TABS: 5.0000 mg | ORAL_TABLET | Freq: Three times a day (TID) | ORAL | Status: DC | PRN

## 2013-11-17 MED ORDER — POLYETHYLENE GLYCOL 3350 17 G PO PACK: 17.0000 g | PACK | Freq: Every day | ORAL | Status: DC | PRN

## 2013-11-17 MED ORDER — GLYCOPYRROLATE 0.2 MG/ML IJ SOLN
INTRAMUSCULAR | Status: DC | PRN
  Administered 2013-11-17: .6 mg via INTRAVENOUS

## 2013-11-17 MED ORDER — LATANOPROST 0.005 % OP SOLN
1.0000 [drp] | Freq: Every day | OPHTHALMIC | Status: DC
  Administered 2013-11-17: 1 [drp] via OPHTHALMIC
  Filled 2013-11-17: qty 2.5

## 2013-11-17 MED ORDER — ONDANSETRON HCL 4 MG PO TABS: 4.0000 mg | ORAL_TABLET | Freq: Four times a day (QID) | ORAL | Status: DC | PRN

## 2013-11-17 MED ORDER — OXYCODONE HCL 5 MG PO TABS
5.0000 mg | ORAL_TABLET | ORAL | Status: DC | PRN
  Administered 2013-11-17 – 2013-11-18 (×2): 10 mg via ORAL
  Filled 2013-11-17 (×2): qty 2

## 2013-11-17 SURGICAL SUPPLY — 65 items
BAG SPEC THK2 15X12 ZIP CLS (MISCELLANEOUS)
BAG ZIPLOCK 12X15 (MISCELLANEOUS) IMPLANT
BANDAGE ELASTIC 6 VELCRO ST LF (GAUZE/BANDAGES/DRESSINGS) ×3 IMPLANT
BANDAGE ESMARK 6X9 LF (GAUZE/BANDAGES/DRESSINGS) ×1 IMPLANT
BEARIN OSTEO TIBIAL (Orthopedic Implant) ×2 IMPLANT
BLADE SAG 18X100X1.27 (BLADE) ×3 IMPLANT
BLADE SAW SGTL 11.0X1.19X90.0M (BLADE) ×3 IMPLANT
BNDG CMPR 9X6 STRL LF SNTH (GAUZE/BANDAGES/DRESSINGS) ×1
BNDG ESMARK 6X9 LF (GAUZE/BANDAGES/DRESSINGS) ×3
BONE CHIP PRESERV 30CC PCAN30 (Bone Implant) ×3 IMPLANT
CEMENT HV SMART SET (Cement) ×2 IMPLANT
CLOSURE WOUND 1/2 X4 (GAUZE/BANDAGES/DRESSINGS) ×1
CONT SPECI 4OZ STER CLIK (MISCELLANEOUS) ×2 IMPLANT
CUFF TOURN SGL QUICK 34 (TOURNIQUET CUFF) ×3
CUFF TRNQT CYL 34X4X40X1 (TOURNIQUET CUFF) ×1 IMPLANT
DRAPE EXTREMITY T 121X128X90 (DRAPE) ×3 IMPLANT
DRAPE POUCH INSTRU U-SHP 10X18 (DRAPES) ×3 IMPLANT
DRAPE U-SHAPE 47X51 STRL (DRAPES) ×3 IMPLANT
DRSG ADAPTIC 3X8 NADH LF (GAUZE/BANDAGES/DRESSINGS) ×3 IMPLANT
DRSG PAD ABDOMINAL 8X10 ST (GAUZE/BANDAGES/DRESSINGS) ×3 IMPLANT
DURAPREP 26ML APPLICATOR (WOUND CARE) ×3 IMPLANT
ELECT REM PT RETURN 9FT ADLT (ELECTROSURGICAL) ×3
ELECTRODE REM PT RTRN 9FT ADLT (ELECTROSURGICAL) ×1 IMPLANT
EVACUATOR 1/8 PVC DRAIN (DRAIN) ×3 IMPLANT
FACESHIELD WRAPAROUND (MASK) ×15 IMPLANT
FACESHIELD WRAPAROUND OR TEAM (MASK) ×5 IMPLANT
GLOVE BIO SURGEON STRL SZ7.5 (GLOVE) ×2 IMPLANT
GLOVE BIO SURGEON STRL SZ8 (GLOVE) ×7 IMPLANT
GLOVE BIOGEL PI IND STRL 8 (GLOVE) ×1 IMPLANT
GLOVE BIOGEL PI INDICATOR 8 (GLOVE) ×2
GLOVE SURG SS PI 6.5 STRL IVOR (GLOVE) IMPLANT
GOWN STRL REUS W/TWL LRG LVL3 (GOWN DISPOSABLE) ×3 IMPLANT
GOWN STRL REUS W/TWL XL LVL3 (GOWN DISPOSABLE) ×2 IMPLANT
GRAFT BNE CANC CHIPS 1-8 30CC (Bone Implant) IMPLANT
HANDPIECE INTERPULSE COAX TIP (DISPOSABLE) ×3
IMMOBILIZER KNEE 20 (SOFTGOODS) ×3 IMPLANT
KIT BASIN OR (CUSTOM PROCEDURE TRAY) ×3 IMPLANT
MANIFOLD NEPTUNE II (INSTRUMENTS) ×3 IMPLANT
NDL SAFETY ECLIPSE 18X1.5 (NEEDLE) ×2 IMPLANT
NEEDLE HYPO 18GX1.5 SHARP (NEEDLE) ×6
NS IRRIG 1000ML POUR BTL (IV SOLUTION) ×3 IMPLANT
PACK TOTAL JOINT (CUSTOM PROCEDURE TRAY) ×3 IMPLANT
PAD ABD 8X10 STRL (GAUZE/BANDAGES/DRESSINGS) ×2 IMPLANT
PADDING CAST COTTON 6X4 STRL (CAST SUPPLIES) ×5 IMPLANT
POSITIONER SURGICAL ARM (MISCELLANEOUS) ×3 IMPLANT
SET HNDPC FAN SPRY TIP SCT (DISPOSABLE) ×1 IMPLANT
SPONGE GAUZE 4X4 12PLY (GAUZE/BANDAGES/DRESSINGS) ×3 IMPLANT
STAPLER VISISTAT 35W (STAPLE) ×3 IMPLANT
STRIP CLOSURE SKIN 1/2X4 (GAUZE/BANDAGES/DRESSINGS) ×1 IMPLANT
SUCTION FRAZIER 12FR DISP (SUCTIONS) ×3 IMPLANT
SUT MNCRL AB 3-0 PS2 18 (SUTURE) ×2 IMPLANT
SUT VIC AB 2-0 CT1 27 (SUTURE) ×9
SUT VIC AB 2-0 CT1 TAPERPNT 27 (SUTURE) ×3 IMPLANT
SUT VLOC 180 0 24IN GS25 (SUTURE) ×3 IMPLANT
SWAB COLLECTION DEVICE MRSA (MISCELLANEOUS) IMPLANT
SYR 20CC LL (SYRINGE) ×3 IMPLANT
SYR 50ML LL SCALE MARK (SYRINGE) ×3 IMPLANT
TOWEL OR 17X26 10 PK STRL BLUE (TOWEL DISPOSABLE) ×3 IMPLANT
TOWEL OR NON WOVEN STRL DISP B (DISPOSABLE) IMPLANT
TOWER CARTRIDGE SMART MIX (DISPOSABLE) ×3 IMPLANT
TRAY FOLEY CATH 14FRSI W/METER (CATHETERS) ×1 IMPLANT
TRAY FOLEY CATH 16FRSI W/METER (SET/KITS/TRAYS/PACK) ×2 IMPLANT
TUBE ANAEROBIC SPECIMEN COL (MISCELLANEOUS) IMPLANT
WATER STERILE IRR 1500ML POUR (IV SOLUTION) ×3 IMPLANT
WRAP KNEE MAXI GEL POST OP (GAUZE/BANDAGES/DRESSINGS) IMPLANT

## 2013-11-17 NOTE — Plan of Care (Signed)
Problem: Consults Goal: Diagnosis- Total Joint Replacement Revision right knee

## 2013-11-17 NOTE — Anesthesia Preprocedure Evaluation (Addendum)
Anesthesia Evaluation  Patient identified by MRN, date of birth, ID band Patient awake    Reviewed: Allergy & Precautions, H&P , NPO status , Patient's Chart, lab work & pertinent test results  Airway Mallampati: II TM Distance: >3 FB Neck ROM: Full    Dental no notable dental hx.    Pulmonary sleep apnea and Continuous Positive Airway Pressure Ventilation , former smoker,  breath sounds clear to auscultation  Pulmonary exam normal       Cardiovascular negative cardio ROS  Rhythm:Regular Rate:Normal     Neuro/Psych negative neurological ROS  negative psych ROS   GI/Hepatic Neg liver ROS, S/p Nisssen   Endo/Other  negative endocrine ROS  Renal/GU negative Renal ROS  negative genitourinary   Musculoskeletal negative musculoskeletal ROS (+)   Abdominal   Peds negative pediatric ROS (+)  Hematology negative hematology ROS (+)   Anesthesia Other Findings Upper front right cap  Reproductive/Obstetrics negative OB ROS                          Anesthesia Physical Anesthesia Plan  ASA: II  Anesthesia Plan: General   Post-op Pain Management:    Induction: Intravenous  Airway Management Planned: Oral ETT  Additional Equipment: None  Intra-op Plan:   Post-operative Plan: Extubation in OR  Informed Consent: I have reviewed the patients History and Physical, chart, labs and discussed the procedure including the risks, benefits and alternatives for the proposed anesthesia with the patient or authorized representative who has indicated his/her understanding and acceptance.   Dental advisory given  Plan Discussed with: CRNA  Anesthesia Plan Comments: (Pt refuses SAB because of unpleasant previous experience.)        Anesthesia Quick Evaluation

## 2013-11-17 NOTE — Anesthesia Postprocedure Evaluation (Signed)
  Anesthesia Post-op Note  Patient: [name]  Procedure(s) Performed: Procedure(s) (LRB): RIGHT KNEE POLYETHYLENE REVISION, bone graft (Right)  Patient Location: PACU  Anesthesia Type: General  Level of Consciousness: awake and alert   Airway and Oxygen Therapy: Patient Spontanous Breathing  Post-op Pain: mild  Post-op Assessment: Post-op Vital signs reviewed, Patient's Cardiovascular Status Stable, Respiratory Function Stable, Patent Airway and No signs of Nausea or vomiting  Last Vitals:  Filed Vitals:   11/17/13 1322  BP: 122/73  Pulse: 75  Temp: 36.8 C  Resp: 16    Post-op Vital Signs: stable   Complications: No apparent anesthesia complications

## 2013-11-17 NOTE — Transfer of Care (Signed)
Immediate Anesthesia Transfer of Care Note  Patient: [name]  Procedure(s) Performed: Procedure(s): RIGHT KNEE POLYETHYLENE REVISION, bone graft (Right)  Patient Location: PACU  Anesthesia Type:General  Level of Consciousness: awake, alert , oriented and patient cooperative  Airway & Oxygen Therapy: Patient Spontanous Breathing and Patient connected to face mask oxygen  Post-op Assessment: Report given to PACU RN and Post -op Vital signs reviewed and stable  Post vital signs: Reviewed and stable  Complications: No apparent anesthesia complications

## 2013-11-17 NOTE — Progress Notes (Signed)
CARE MANAGEMENT NOTE 11/17/2013  Patient:  [name], [name]   Account Number:  [id number]  Date Initiated:  11/17/2013  Documentation initiated by:  DAVIS,RHONDA  Subjective/Objective Assessment:   rt total knee revision after failed total knee replacement.     Action/Plan:   home with hhc   Anticipated DC Date:  11/20/2013   Anticipated DC Plan:  [place]         Choice offered to / List presented to:             Status of service:  In process, will continue to follow Medicare Important Message given?   (If response is "NO", the following Medicare IM given date fields will be blank) Date Medicare IM given:   Date Additional Medicare IM given:    Discharge Disposition:    Per UR Regulation:  Reviewed for med. necessity/level of care/duration of stay  If discussed at [place] of Stay Meetings, dates discussed:    Comments:  [id number]/Rhonda Rosana Hoes RN, BSN, [place], [phone] Chart reviewed for update of needs and condition./ operative day/

## 2013-11-17 NOTE — Evaluation (Signed)
Physical Therapy Evaluation Patient Details Name: [name] MRN: [id number] DOB: January 14, 1951 Today's Date: 11/17/2013   History of Present Illness     Clinical Impression  Pt s/p R TKR revision presents with decreased R LE strength/ROM and post op discomfort limiting functional mobility. Pt should progress well to d/c home with family assist and HHPT follow up.    Follow Up Recommendations Home health PT    Equipment Recommendations  None recommended by PT    Recommendations for Other Services OT consult     Precautions / Restrictions Precautions Precautions: Knee;Fall Restrictions Weight Bearing Restrictions: No Other Position/Activity Restrictions: WBAT      Mobility  Bed Mobility Overal bed mobility: Modified Independent                Transfers Overall transfer level: Needs assistance Equipment used: Rolling walker (2 wheeled) Transfers: Sit to/from Stand Sit to Stand: Min assist         General transfer comment: cues for use of UEs to self assist and for LE management  Ambulation/Gait Ambulation/Gait assistance: Min assist;Min guard Ambulation Distance (Feet): 100 Feet (twice) Assistive device: Rolling walker (2 wheeled) Gait Pattern/deviations: Step-to pattern;Step-through pattern     General Gait Details: min cues for initial sequence and position from [employer]            Wheelchair Mobility    Modified Rankin (Stroke Patients Only)       Balance                                             Pertinent Vitals/Pain 3/10; premed; ice packs provided    Home Living Family/patient expects to be discharged to:: Private residence Living Arrangements: Spouse/significant other Available Help at Discharge: Family Type of Home: House Home Access: Stairs to enter Entrance Stairs-Rails: Right;Left;Can reach both Technical brewer of Steps: 4 Home Layout: One level Home Equipment: Walker - standard;Walker - 4  wheels;Crutches      Prior Function Level of Independence: Independent;Independent with assistive device(s)               Hand Dominance   Dominant Hand: Right    Extremity/Trunk Assessment   Upper Extremity Assessment: RUE deficits/detail RUE Deficits / Details: R wrist fused         Lower Extremity Assessment: RLE deficits/detail RLE Deficits / Details: 3/5 quads with AAROM at knee -5 - 80    Cervical / Trunk Assessment: Normal  Communication   Communication: No difficulties  Cognition Arousal/Alertness: Awake/alert Behavior During Therapy: WFL for tasks assessed/performed Overall Cognitive Status: Within Functional Limits for tasks assessed                      General Comments      Exercises Total Joint Exercises Ankle Circles/Pumps: AROM;Both;15 reps;Supine Quad Sets: AROM;Both;10 reps;Supine Heel Slides: AAROM;20 reps;Supine;Right Straight Leg Raises: AROM;Right;10 reps;Supine      Assessment/Plan    PT Assessment Patient needs continued PT services  PT Diagnosis Difficulty walking   PT Problem List Decreased strength;Decreased range of motion;Decreased activity tolerance;Decreased mobility;Decreased knowledge of use of DME;Pain  PT Treatment Interventions DME instruction;Gait training;Stair training;Functional mobility training;Therapeutic activities;Therapeutic exercise;Patient/family education   PT Goals (Current goals can be found in the Care Plan section) Acute Rehab PT Goals Patient Stated Goal: HOme using my crutches PT Goal Formulation:  With patient Time For Goal Achievement: 11/24/13 Potential to Achieve Goals: Good    Frequency 7X/week   Barriers to discharge        Co-evaluation               End of Session Equipment Utilized During Treatment: Gait belt Activity Tolerance: Patient tolerated treatment well Patient left: in chair;with family/visitor present Nurse Communication: Mobility status         Time:  1483-0735 PT Time Calculation (min): 29 min   Charges:   PT Evaluation $Initial PT Evaluation Tier I: 1 Procedure PT Treatments $Gait Training: 8-22 mins $Therapeutic Exercise: 8-22 mins   PT G Codes:          Mathis Fare 11/17/2013, 5:09 PM

## 2013-11-17 NOTE — H&P (View-Only) (Signed)
[name]. [name] DOB: 03-03-1951 Single / Language: Cleophus Molt / Race: White Male  Date of Admission:  11-17-2013  Chief complaint:  Failed Right Total Knee  History of Present Illness The patient is a 63 year old male who comes in for a preoperative History and Physical. The patient is scheduled for a right total knee arthroplasty (revision) to be performed by Dr. Dione Plover. Aluisio, MD at [facility] on 11-17-2013. The patient is a 63 year old male presenting for check of his knee. The patient comes in 12 years (and 6 months) out from left total knee arthroplasty (and the right knee was replaced by Dr. Collie Siad in 1993). The patient states that he is doing fair but the right knee is not doing that well at this time. The pain is under fair control at this time and describe their pain as mild to moderate. The right knee has been swollen with fluid and he has had some instability. He has been having trouble with his right knee for the past 3-4 years but it has been more problematic recently. He is having instability in the knee as well. He has been getting cramps in his calf. He states the last time he felt like that, he had a DVT.He is not having any issues with the left knee at this time. [name] has been having a lot of issues with his right knee. He had this replaced approximately 21 years ago. Recently, he has developed swelling and an unstable feeling in that knee. He has not had issues with the left knee. I replaced that one 12 1/2 years ago. He feels that one is doing well. Plan is to change out the polyethylene liner versus revision of the knee replacement. They have been treated conservatively in the past for the above stated problem and despite conservative measures, they continue to have progressive pain and severe functional limitations and dysfunction. They have failed non-operative management. It is felt that they would benefit from undergoing revision of the total joint  replacement. Risks and benefits of the procedure have been discussed with the patient and they elect to proceed with surgery. There are no active contraindications to surgery such as ongoing infection or rapidly progressive neurological disease.  Problem List/Past Medical S/P total knee replacement (V43.65)   Allergies Tape 1"X5yd *MEDICAL DEVICES*. Adhesive **Patient IS ABLE to use paper tape and steristrips though**    Family History Diabetes Mellitus. father and sister Heart Disease. First Degree Relatives. mother Osteoporosis. mother Cancer. father Hypertension. mother and father  Social History Pain Contract. yes Current work status. disabled Drug/Alcohol Rehab (Currently). no Living situation. live with partner Number of flights of stairs before winded. greater than 5 Children. 1 Alcohol use. Occasional alcohol use, Drinks beer. current drinker; drinks beer; less than 5 per week Illicit drug use. no Marital status. single Tobacco / smoke exposure. yes Exercise. Exercises daily; does running / walking Tobacco use. Former smoker. former smoker; smoke(d) 1 pack(s) per day Previously in rehab. no Post-Surgical Plans. HOME    Medication History FentaNYL (25MCG/HR Patch 72HR, Transdermal) Active. Sertraline HCl (50MG Tablet, 1 Oral one po q day, Taken starting 08/29/2013) Active. TraZODone HCl (50MG Tablet, 1 (one) Tablet Oral QHS, Taken starting 10/13/2013) Active. Fluticasone Propionate (50MCG/ACT Suspension, Nasal) Active. Travatan (0.004% Solution, Ophthalmic) Active. Systane (0.4-0.3% Solution, Ophthalmic) Active. Calcium Citrate ( Oral) Specific dose unknown - Active. Multivitamin ( Oral) Active. Fish Oil Burp-Less ( Oral) Specific dose unknown - Active. Medications Reconciled.  Past Surgical History Gallbladder Surgery. laporoscopic Spinal Fusion. neck Carpal Tunnel Repair. bilateral Other Surgery. ACID REFLUX SURGERY/  CARLEDGE REMOVED FROM BIL KNEES. Appendectomy Tonsillectomy Total Knee Replacement. bilateral   Past Medical History Sleep Apnea. 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 02/25/2005 Impaired Vision Impaired Hearing Hiatal Hernia Gastric Ulcer Hemorrhoids Irritable bowel syndrome Diverticulosis Ulcerative Colitis Colonic Polyps Degenerative Disc Disease Measles Mumps Kidney Stone DVT    Review of Systems General:Not Present- Chills, Fever, Night Sweats, Fatigue, Weight Gain, Weight Loss and Memory Loss. Skin:Not Present- Hives, Itching, Rash, Eczema and Lesions. HEENT:Present- Hearing Loss. Not Present- Tinnitus, Headache, Double Vision, Visual Loss and Dentures. Respiratory:Not Present- Shortness of breath with exertion, Shortness of breath at rest, Allergies, Coughing up blood and Chronic Cough. Cardiovascular:Not Present- Chest Pain, Racing/skipping heartbeats, Difficulty Breathing Lying Down, Murmur, Swelling and Palpitations. Gastrointestinal:Not Present- Bloody Stool, Heartburn, Abdominal Pain, Vomiting, Nausea, Constipation, Diarrhea, Difficulty Swallowing, Jaundice and Loss of appetitie. Male Genitourinary:Not Present- Urinary frequency, Blood in Urine, Weak urinary stream, Discharge, Flank Pain, Incontinence, Painful Urination, Urgency, Urinary Retention and Urinating at Night. Musculoskeletal:Present- Joint Swelling, Joint Pain, Back Pain and Morning Stiffness. Not Present- Muscle Weakness, Muscle Pain and Spasms. Neurological:Not Present- Tremor, Dizziness, Blackout spells, Paralysis, Difficulty with balance and Weakness. Psychiatric:Not Present- Insomnia.    VitalsAM Weight: 220 lb Height: 67 in Weight was reported by patient. Height was reported by patient. Body Surface Area: 2.17 m Body Mass Index: 34.46 kg/m Pulse: 64 (Regular) Resp.: 14 (Unlabored) BP: 142/74 (Sitting, Left Arm,  Standard)     Physical Exam The physical exam findings are as follows:   General Mental Status - Alert, cooperative and good historian. General Appearance- pleasant. Not in acute distress. Orientation- Oriented X3. Build & Nutrition- Well nourished and Well developed.   Head and Neck Head- normocephalic, atraumatic . Neck Global Assessment- supple. no bruit auscultated on the right and no bruit auscultated on the left.   Eye Vision- Wears corrective lenses. Pupil- Bilateral- Regular and Round. Motion- Bilateral- EOMI.   Chest and Lung Exam Auscultation: Breath sounds:- clear at anterior chest wall and - clear at posterior chest wall. Adventitious sounds:- No Adventitious sounds.   Cardiovascular Auscultation:Rhythm- Regular rate and rhythm. Heart Sounds- S1 WNL and S2 WNL. Murmurs & Other Heart Sounds:Auscultation of the heart reveals - No Murmurs.   Abdomen Inspection:Contour- Generalized mild distention. Palpation/Percussion:Tenderness- Abdomen is non-tender to palpation. Rigidity (guarding)- Abdomen is soft. Auscultation:Auscultation of the abdomen reveals - Bowel sounds normal.   Male Genitourinary   Note: Not done, not pertinent to present illness  Musculoskeletal   Note: He is alert and oriented. No apparent distress. The right knee does show an effusion. There is no warmth about the knee. He is in slight varus now. His range of motion is about 5-120. He does have some slight varus , valgus and anterior/posterior laxity to the knee. There is no gross instability. The left knee shows no effusion. Range is 0-120. No tenderness or instability.  RADIOGRAPHS: AP and lateral of both knees, show that he has an old osteonic prosthesis in the right knee. The polyethylene was completely worn. He has some cystic changes behind the femoral component but no definitive loosening. The left knee has a Chartered certified accountant  prosthesis, in excellent position with no periprosthetic abnormalities.   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 Wynelle Link.  Plan is to go home.  PCP - Dr. Hulan Fess  The patient will not receive TXA (tranexamic acid) due to: DVT Right Leg  Signed electronically by Joelene Millin, III PA-C

## 2013-11-17 NOTE — Interval H&P Note (Signed)
History and Physical Interval Note:  11/17/2013 7:12 AM  [name]  has presented today for surgery, with the diagnosis of FAILED RIGHT TOTAL KNEE ARTHROPLASTY  The various methods of treatment have been discussed with the patient and family. After consideration of risks, benefits and other options for treatment, the patient has consented to  Procedure(s): RIGHT KNEE POLYETHYLENE VERSES A TOTAL KNEE ARTHROPLASTY REVISION (Right) as a surgical intervention .  The patient's history has been reviewed, patient examined, no change in status, stable for surgery.  I have reviewed the patient's chart and labs.  Questions were answered to the patient's satisfaction.     Dione Plover [name]

## 2013-11-17 NOTE — Brief Op Note (Signed)
11/17/2013  8:44 AM  PATIENT:  [name]  63 y.o. male  PRE-OPERATIVE DIAGNOSIS:  FAILED RIGHT TOTAL KNEE ARTHROPLASTY  POST-OPERATIVE DIAGNOSIS:  FAILED RIGHT TOTAL KNEE ARTHROPLASTY  PROCEDURE:  Procedure(s): RIGHT KNEE POLYETHYLENE REVISION, bone graft (Right)  SURGEON:  Surgeon(s) and Role:    * [name], MD - Primary  PHYSICIAN ASSISTANT:   ASSISTANTS: Arlee Muslim, PA-C   ANESTHESIA:   general  EBL:  Total I/O In: 1400 [I.V.:1400] Out: 250 [Urine:250]  BLOOD ADMINISTERED:none  DRAINS: (Medium) Hemovact drain(s) in the right with  Suction Open   LOCAL MEDICATIONS USED:  OTHER Exparel  SPECIMEN:  No Specimen  DISPOSITION OF SPECIMEN:  N/A  COUNTS:  YES  TOURNIQUET:   Total Tourniquet Time Documented: Thigh (Right) - 40 minutes Total: Thigh (Right) - 40 minutes   DICTATION: .Other Dictation: Dictation Number [id number]  PLAN OF CARE: Admit to inpatient   PATIENT DISPOSITION:  PACU - hemodynamically stable.

## 2013-11-18 LAB — BASIC METABOLIC PANEL
BUN: 14 mg/dL (ref 6–23)
CHLORIDE: 107 meq/L (ref 96–112)
CO2: 26 mEq/L (ref 19–32)
Calcium: 8.6 mg/dL (ref 8.4–10.5)
Creatinine, Ser: 0.99 mg/dL (ref 0.50–1.35)
GFR calc non Af Amer: 85 mL/min — ABNORMAL LOW (ref >90)
Glucose, Bld: 108 mg/dL — ABNORMAL HIGH (ref 70–99)
Potassium: 4.1 mEq/L (ref 3.7–5.3)
SODIUM: 144 meq/L (ref 137–147)

## 2013-11-18 LAB — CBC
HCT: 34.6 % — ABNORMAL LOW (ref 39.0–52.0)
Hemoglobin: 12.2 g/dL — ABNORMAL LOW (ref 13.0–17.0)
MCH: 31.7 pg (ref 26.0–34.0)
MCHC: 35.3 g/dL (ref 30.0–36.0)
MCV: 89.9 fL (ref 78.0–100.0)
PLATELETS: 172 10*3/uL (ref 150–400)
RBC: 3.85 MIL/uL — ABNORMAL LOW (ref 4.22–5.81)
RDW: 13 % (ref 11.5–15.5)
WBC: 10 10*3/uL (ref 4.0–10.5)

## 2013-11-18 MED ORDER — RIVAROXABAN 10 MG PO TABS: 10.0000 mg | ORAL_TABLET | Freq: Every day | ORAL | Status: DC

## 2013-11-18 MED ORDER — OXYCODONE HCL 5 MG PO TABS: 5.0000 mg | ORAL_TABLET | ORAL | Status: DC | PRN

## 2013-11-18 MED ORDER — METHOCARBAMOL 500 MG PO TABS: 500.0000 mg | ORAL_TABLET | Freq: Four times a day (QID) | ORAL | Status: DC | PRN

## 2013-11-18 NOTE — Progress Notes (Signed)
CARE MANAGEMENT NOTE 11/18/2013  Patient:  [name], [name]   Account Number:  [id number]  Date Initiated:  11/17/2013  Documentation initiated by:  DAVIS,RHONDA  Subjective/Objective Assessment:   rt total knee revision after failed total knee replacement.     Action/Plan:   home with hhc   Anticipated DC Date:  11/20/2013   Anticipated DC Plan:  [place]  CM consult      [facility] Choice  HOME HEALTH   Choice offered to / List presented to:  C-1 Patient        [place] arranged  HH-2 PT      [place]   Status of service:  Completed, signed off Medicare Important Message given?   (If response is "NO", the following Medicare IM given date fields will be blank) Date Medicare IM given:   Date Additional Medicare IM given:    Discharge Disposition:  [place]  Per UR Regulation:  Reviewed for med. necessity/level of care/duration of stay  If discussed at [place] of Stay Meetings, dates discussed:    Comments:  11/18/2013 1030 NCM spoke to pt and offered choice for [facility]. Pt agreeable to [facility] with [facility]. Has DME. Jonnie Finner RN Case Mgmt phone [phone]  [phone] Rosana Hoes, RN, BSN, [place], [phone] Chart reviewed for update of needs and condition./ operative day/

## 2013-11-18 NOTE — Progress Notes (Signed)
Pharmacy Brief Progress Note Re: Xarelto  Patient was discharged early this AM before pharmacy could complete Xarelto education.  Pharmacy called patient at home and counseled patient on use and safety of Xarelto, including how to take the medication, reason for Xarelto use s/p orthopedic procedure, and signs/symptoms of bleeding.  Hershal Coria, PharmD, BCPS Pager: [phone] 11/18/2013 2:01 PM

## 2013-11-18 NOTE — Discharge Instructions (Signed)
Dr. Gaynelle Arabian Total Joint Specialist [facility] [address] [place], [place] [address] [phone]  TOTAL KNEE REPLACEMENT POSTOPERATIVE DIRECTIONS    Knee Rehabilitation, Guidelines Following Surgery  Results after knee surgery are often greatly improved when you follow the exercise, range of motion and muscle strengthening exercises prescribed by your doctor. Safety measures are also important to protect the knee from further injury. Any time any of these exercises cause you to have increased pain or swelling in your knee joint, decrease the amount until you are comfortable again and slowly increase them. If you have problems or questions, call your caregiver or physical therapist for advice.   HOME CARE INSTRUCTIONS  Remove items at home which could result in a fall. This includes throw rugs or furniture in walking pathways.  Continue medications as instructed at time of discharge. You may have some home medications which will be placed on hold until you complete the course of blood thinner medication.  You may start showering once you are discharged home but do not submerge the incision under water. Just pat the incision dry and apply a dry gauze dressing on daily. Walk with walker as instructed.  You may resume a sexual relationship in one month or when given the OK by  your doctor.   Use walker as long as suggested by your caregivers.  Avoid periods of inactivity such as sitting longer than an hour when not asleep. This helps prevent blood clots.  You may put full weight on your legs and walk as much as is comfortable.  You may return to work once you are cleared by your doctor.  Do not drive a car for 6 weeks or until released by you surgeon.   Do not drive while taking narcotics.  Wear the elastic stockings for three weeks following surgery during the day but you may remove then at night. Make sure you keep all of your appointments after your  operation with all of your doctors and caregivers. You should call the office at the above phone number and make an appointment for approximately two weeks after the date of your surgery. Change the dressing daily and reapply a dry dressing each time. Please pick up a stool softener and laxative for home use as long as you are requiring pain medications.  Continue to use ice on the knee for pain and swelling from surgery. You may notice swelling that will progress down to the foot and ankle.  This is normal after surgery.  Elevate the leg when you are not up walking on it.   It is important for you to complete the blood thinner medication as prescribed by your doctor.  Continue to use the breathing machine which will help keep your temperature down.  It is common for your temperature to cycle up and down following surgery, especially at night when you are not up moving around and exerting yourself.  The breathing machine keeps your lungs expanded and your temperature down.  RANGE OF MOTION AND STRENGTHENING EXERCISES  Rehabilitation of the knee is important following a knee injury or an operation. After just a few days of immobilization, the muscles of the thigh which control the knee become weakened and shrink (atrophy). Knee exercises are designed to build up the tone and strength of the thigh muscles and to improve knee motion. Often times heat used for twenty to thirty minutes before working out will loosen up your tissues and help with improving the  range of motion but do not use heat for the first two weeks following surgery. These exercises can be done on a training (exercise) mat, on the floor, on a table or on a bed. Use what ever works the best and is most comfortable for you Knee exercises include:  Leg Lifts - While your knee is still immobilized in a splint or cast, you can do straight leg raises. Lift the leg to 60 degrees, hold for 3 sec, and slowly lower the leg. Repeat 10-20 times 2-3  times daily. Perform this exercise against resistance later as your knee gets better.  Quad and Hamstring Sets - Tighten up the muscle on the front of the thigh (Quad) and hold for 5-10 sec. Repeat this 10-20 times hourly. Hamstring sets are done by pushing the foot backward against an object and holding for 5-10 sec. Repeat as with quad sets.  A rehabilitation program following serious knee injuries can speed recovery and prevent re-injury in the future due to weakened muscles. Contact your doctor or a physical therapist for more information on knee rehabilitation.   SKILLED REHAB INSTRUCTIONS: If the patient is transferred to a skilled rehab facility following release from the hospital, a list of the current medications will be sent to the facility for the patient to continue.  When discharged from the skilled rehab facility, please have the facility set up the patient's [place] prior to being released. Also, the skilled facility will be responsible for providing the patient with their medications at time of release from the facility to include their pain medication, the muscle relaxants, and their blood thinner medication. If the patient is still at the rehab facility at time of the two week follow up appointment, the skilled rehab facility will also need to assist the patient in arranging follow up appointment in our office and any transportation needs.  MAKE SURE YOU:  Understand these instructions.  Will watch your condition.  Will get help right away if you are not doing well or get worse.    Pick up stool softner and laxative for home. Do not submerge incision under water. May shower. Continue to use ice for pain and swelling from surgery. Take the xarelto until you have completed it then begin Aspirin 325 mg daily for 5 weeks

## 2013-11-18 NOTE — Progress Notes (Signed)
RT placed patient on CPAP per home setting of 16 cmH2O. Water chamber is filled for humidification. Patient brought own tubing and mask from home.

## 2013-11-18 NOTE — Progress Notes (Signed)
   Subjective: 1 Day Post-Op Procedure(s) (LRB): RIGHT KNEE POLYETHYLENE REVISION, bone graft (Right) Patient reports pain as mild.   Patient ambulated with therapy yesterday Plan is to go Home after hospital stay.  Objective: Vital signs in last 24 hours: Temp:  [97.7 F (36.5 C)-99.1 F (37.3 C)] 98 F (36.7 C) (04/18 0512) Pulse Rate:  [65-78] 69 (04/18 0512) Resp:  [12-20] 18 (04/18 0730) BP: (97-125)/(60-75) 109/69 mmHg (04/18 0512) SpO2:  [93 %-100 %] 98 % (04/18 0512) Weight:  [228 lb (103.42 kg)] 228 lb (103.42 kg) (04/17 1011)  Intake/Output from previous day:  Intake/Output Summary (Last 24 hours) at 11/18/13 0819 Last data filed at 11/18/13 0742  Gross per 24 hour  Intake 3728.33 ml  Output   2875 ml  Net 853.33 ml    Intake/Output this shift: Total I/O In: -  Out: 5 [Drains:5]  Labs:  Recent Labs  11/18/13 0459  HGB 12.2*    Recent Labs  11/18/13 0459  WBC 10.0  RBC 3.85*  HCT 34.6*  PLT 172    Recent Labs  11/18/13 0459  NA 144  K 4.1  CL 107  CO2 26  BUN 14  CREATININE 0.99  GLUCOSE 108*  CALCIUM 8.6   No results found for this basename: LABPT, INR,  in the last 72 hours  EXAM General - Patient is Alert, Appropriate and Oriented Extremity - Neurologically intact Neurovascular intact No cellulitis present Compartment soft Dressing - dressing C/D/I Motor Function - intact, moving foot and toes well on exam.  Hemovac pulled without difficulty.  Past Medical History  Diagnosis Date  . Back pain, chronic     pt states he has 3 herniated disks--uses fentanyl patch for pain  . DVT (deep venous thrombosis) 2001 or 2002    right leg-required hospitalization x 8 days  . BPH (benign prostatic hyperplasia)     frequent urination, not able to hold urine well  . Arthritis     oa and ddd  . Colon polyps   . Blood transfusion without reported diagnosis 1950    rH negative  . Ulcer 1966  . Hyperlipidemia 2012  . DVT (deep venous  thrombosis) 1996    right  . Sleep apnea     pt uses cpap - setting of 16  . GERD (gastroesophageal reflux disease)   . Lumbago   . Glaucoma     both eyes  . H/O hiatal hernia   . History of IBS   . Diverticulosis   . Ulcerative colitis   . History of kidney stones     x 1  . Hemorrhoid   . Complication of anesthesia     pt had spinal for a knee replacement--"woke up" during surgery--and sick after surgery  . 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

## 2013-11-18 NOTE — Op Note (Signed)
NAME:  [name], TAY NO.:  [id number]  MEDICAL RECORD NO.:  [id number]  LOCATION:  4825                         FACILITY:  [facility]  PHYSICIAN:  Gaynelle Arabian, M.D.    DATE OF BIRTH:  1951-01-26  DATE OF PROCEDURE:  11/17/2013 DATE OF DISCHARGE:                              OPERATIVE REPORT   POSTOPERATIVE DIAGNOSIS:  Failed right total knee arthroplasty.  POSTOPERATIVE DIAGNOSIS:  Failed right total knee arthroplasty.  PROCEDURE:  Right knee polyethylene revision with bone grafting.  SURGEON:  Gaynelle Arabian, M.D.  ASSISTANT:  Alexzandrew L. Perkins, P.A.C.  ANESTHESIA:  General.  ESTIMATED BLOOD LOSS:  Minimal.  DRAINS:  Hemovac x1.  TOURNIQUET TIME:  39 minutes at 300 mmHg.  COMPLICATIONS:  None.  CONDITION:  Stable to recovery.  BRIEF CLINICAL NOTE:  [name] is a 63 year old male, who had a right total knee arthroplasty done over 20 years ago.  Over the past 6 months to year, he has had recurrent effusions and pain.  He has developed some instability in the knee, and his radiograph shows significant polyethylene wear.  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  He presents now for polyethylene versus total knee arthroplasty revision.  PROCEDURE IN DETAIL:  After successful administration of general anesthetic, a tourniquet was placed high on his right thigh and his right lower extremity.  He was prepped and draped in the usual sterile fashion.  Extremities wrapped in Esmarch, tourniquet inflated to 300 mmHg.  He had a previous medial parapatellar incision, and that incision was utilized.  Skin cut with a 10 blade for subcutaneous tissue to the extensor mechanism.  Subcutaneous flaps were created and then a fresh blade used to make a medial parapatellar arthrotomy.  There was minimal joint fluid noted.  He has a lot of hypertrophic synovitis consistent with reaction to polyethylene wear debris.  Thorough  synovectomy is performed.  Patella was then everted, and there was some cold flow where the patella, but no gross wear or loss of any significant polyethylene. Soft tissue on the proximal medial tibia subperiosteally elevated to the joint line with a knife and into the semimembranosus bursa with a Cobb elevator.  Soft tissue laterally was elevated with attention being paid to avoid the patellar tendon on tibial tubercle.  We then everted the patella, again flexed the knee 90 degrees.  I removed the tibial polyethylene from the tibial tray utilizing an osteotome.  The locking mechanism was also removed.  There is significant wear on the posterior aspect of the polyethylene especially medially.  There is gross failure with cracking of the posterior pilon aspect of the polyethylene, and there is significant amount of volumetric loss of the polyethylene.  We then thoroughly irrigated the wound with saline solution.  The tibial component is exposed and is found to be solid.  I attempted to place an osteotome underneath the tibia component, and it was sealed all around the anterior, medial, and lateral aspects.  It was axially and rotationally stable.  On the femoral side, there was evidence of a cyst just medial aspect of the component on the medial femoral condyle.  I restarted removing contents and this involved a fairly significant portion of the condyle, but fortunately, a femoral component was also very well fixed in good position.  We removed the contents of the cyst cavity, thoroughly irrigated, and then placed 30 mL of cancellous bone graft in there.  In order to keep the graft in place, it was decided it was going to need to put cement over it and thus 1 batch of cement was mixed and once ready for implantation, packed also went into the surface of that defect and served as a sealed to keep the graft and prevent it from migrating.  The polyethylene that was removed was a size 9  and series 7000 polyethylene which was 12 mm thick.  Given __________ 15 mm stem and with the 15 trial, full extension was achieved with excellent varus-valgus and anterior-posterior balance throughout full range of motion.  We removed the trial and placed a permanent 15 mm insert into the tibial tray.  The knee was reduced with the same stability.  The wound was then copiously irrigated with saline solution.  The cement had hardened, then provided an effective barrier to keep the graft in place. Wound was copiously irrigated with saline solution, and the arthrotomy closed over Hemovac drain with a running #1 V-Loc suture.  Exparel was injected 20 mL mixed with 30 mL of saline and injected into periosteum of the femur, the extensor mechanism, and subcu tissues.  A 20 mL of 0.25% Marcaine is injected into the same tissues.  Wound was further irrigated and tourniquet time released for a total time of 40 minutes. Subcutaneous was closed with interrupted 2-0 Vicryl and skin closed with staples.  The drains hooked to suction.  Incision cleaned and dried and Steri-Strips and a bulky sterile dressing were applied.  He was then placed into a knee immobilizer, awakened, and transferred to recovery in stable condition.  Note that, a surgical assistant was a medical necessity for this procedure to do it in a safe and expeditious manner.  Surgical assistant was necessary for retraction of vital ligaments and neurovascular structures and for safe removal and replacement of new prosthesis.  Also necessary for appropriate exposure to effectively work on the bone cyst and the medial femoral condyle without damaging the bone or the prosthesis.     Gaynelle Arabian, M.D.     FA/MEDQ  D:  11/17/2013  T:  11/18/2013  Job:  [id number]

## 2013-11-18 NOTE — Progress Notes (Signed)
Physical Therapy Treatment Patient Details Name: [name] MRN: [id number] DOB: 07-29-1951 Today's Date: 11/18/2013    History of Present Illness s/p R knee revision    PT Comments    Motivated and progressing well  Follow Up Recommendations  Home health PT     Equipment Recommendations  None recommended by PT    Recommendations for Other Services OT consult     Precautions / Restrictions Precautions Precautions: Knee;Fall Restrictions Weight Bearing Restrictions: No Other Position/Activity Restrictions: WBAT    Mobility  Bed Mobility Overal bed mobility:  (pt up in chair)                Transfers Overall transfer level: Needs assistance Equipment used: Crutches Transfers: Sit to/from Stand Sit to Stand: Supervision         General transfer comment: cues for use of UEs to self assist  Ambulation/Gait Ambulation/Gait assistance: Min guard;Supervision Ambulation Distance (Feet): 400 Feet Assistive device: Rolling walker (2 wheeled) Gait Pattern/deviations: Step-to pattern;Step-through pattern;Decreased step length - right;Decreased step length - left     General Gait Details: min cues for position from crutches   Stairs Stairs: Yes Stairs assistance: Min guard Stair Management: Two rails;Step to pattern;Forwards Number of Stairs: 4 General stair comments: cues for sequence.    Wheelchair Mobility    Modified Rankin (Stroke Patients Only)       Balance                                    Cognition Arousal/Alertness: Awake/alert Behavior During Therapy: WFL for tasks assessed/performed Overall Cognitive Status: Within Functional Limits for tasks assessed                      Exercises Total Joint Exercises Ankle Circles/Pumps: AROM;Both;15 reps;Supine Quad Sets: AROM;Both;Supine;15 reps Heel Slides: AAROM;20 reps;Supine;Right Straight Leg Raises: AROM;Right;Supine;15 reps    General Comments         Pertinent Vitals/Pain 3/10, premed, ice packs provided    Home Living Family/patient expects to be discharged to:: Private residence Living Arrangements: Spouse/significant other Available Help at Discharge: Family;Available 24 hours/day Type of Home: House Home Access: Stairs to enter Entrance Stairs-Rails: Right;Left;Can reach both Home Layout: One level Home Equipment: Walker - standard;Walker - 4 wheels;Crutches Additional Comments: has access to 3 in 1 and shower seat if needed    Prior Function Level of Independence: Independent;Independent with assistive device(s)      Comments: Pt with herniated discs, difficulty with LB dressing PTA due to limited knee ROM and back pain.   PT Goals (current goals can now be found in the care plan section) Acute Rehab PT Goals Patient Stated Goal: HOme using my crutches PT Goal Formulation: With patient Time For Goal Achievement: 11/24/13 Potential to Achieve Goals: Good Progress towards PT goals: Progressing toward goals    Frequency  7X/week    PT Plan Current plan remains appropriate    Co-evaluation             End of Session   Activity Tolerance: Patient tolerated treatment well Patient left: in chair;with call bell/phone within reach     Time: 0812-0840 PT Time Calculation (min): 28 min  Charges:  $Gait Training: 8-22 mins $Therapeutic Exercise: 8-22 mins                    G Codes:  Mathis Fare 11/18/2013, 11:21 AM

## 2013-11-18 NOTE — Progress Notes (Signed)
Pt to d/c home with [facility] home [facility]. AVS reviewed and "My Chart" discussed with pt. Pt capable of verbalizing medications, dressing changes, signs and symptoms of infection, and follow-up appointments. Remains hemodynamically stable. No signs and symptoms of distress. Educated pt to return to ER in the case of SOB, dizziness, or chest pain.

## 2013-11-18 NOTE — Evaluation (Signed)
Occupational Therapy Evaluation Patient Details Name: [name] MRN: [id number] DOB: [birth year] Today's Date: 11/18/2013    History of Present Illness s/p R knee revision   Clinical Impression   Instructed pt and wife on use of AE for LB bathing and dressing and recommended use of 3 in 1 vs shower seat for safety with showering.  Wife plans to assist pt as needed with ADL.  No further OT needs.    Follow Up Recommendations  No OT follow up    Equipment Recommendations  None recommended by OT    Recommendations for Other Services       Precautions / Restrictions Precautions Precautions: Knee;Fall Restrictions Weight Bearing Restrictions: No Other Position/Activity Restrictions: WBAT      Mobility Bed Mobility Overal bed mobility:  (pt up in chair)                Transfers Overall transfer level: Needs assistance   Transfers: Sit to/from Stand Sit to Stand: Supervision              Balance                                            ADL Overall ADL's : Needs assistance/impaired Eating/Feeding: Independent;Sitting   Grooming: Wash/dry hands;Supervision/safety   Upper Body Bathing: Supervision/ safety;Sitting   Lower Body Bathing: Moderate assistance;Sit to/from stand   Upper Body Dressing : Set up;Sitting   Lower Body Dressing: Moderate assistance;Sit to/from stand   Toilet Transfer: Supervision/safety           Functional mobility during ADLs: Supervision/safety General ADL Comments: Educated pt and wife in use of AE for LB ADL given his back issues and now R knee limitations.  Recommended pt use 3 in1 or shower seat for showering.  Verbally instructed in shower transfer, placement of suction cup grab bar, and wife supervising transfer.  Pt agreeable.     Vision                     Perception     Praxis      Pertinent Vitals/Pain Chronic back pain, knee 3/10, VSS     Hand Dominance Right    Extremity/Trunk Assessment Upper Extremity Assessment Upper Extremity Assessment: RUE deficits/detail RUE Deficits / Details: R wrist fused, finds crutches easier on his wrist than walker   Lower Extremity Assessment Lower Extremity Assessment: Defer to PT evaluation   Cervical / Trunk Assessment Cervical / Trunk Assessment: Normal   Communication Communication Communication: No difficulties   Cognition Arousal/Alertness: Awake/alert Behavior During Therapy: WFL for tasks assessed/performed Overall Cognitive Status: Within Functional Limits for tasks assessed                     General Comments       Exercises       Shoulder Instructions      Home Living Family/patient expects to be discharged to:: Private residence Living Arrangements: Spouse/significant other Available Help at Discharge: Family;Available 24 hours/day Type of Home: House Home Access: Stairs to enter [employer] of Steps: 4 Entrance Stairs-Rails: Right;Left;Can reach both Home Layout: One level     Bathroom Shower/Tub: Occupational psychologist: Handicapped height (comfort height)     Home Equipment: Walker - standard;Walker - 4 wheels;Crutches   Additional Comments: has access to 3 in  1 and shower seat if needed      Prior Functioning/Environment Level of Independence: Independent;Independent with assistive device(s)        Comments: Pt with herniated discs, difficulty with LB dressing PTA due to limited knee ROM and back pain.    OT Diagnosis:     OT Problem List:     OT Treatment/Interventions:      OT Goals(Current goals can be found in the care plan section) Acute Rehab OT Goals Patient Stated Goal: HOme using my crutches  OT Frequency:     Barriers to D/C:            Co-evaluation              End of Session CPM Right Knee CPM Right Knee: Off  Activity Tolerance: Patient tolerated treatment well Patient left: in chair;with call  bell/phone within reach;with family/visitor present   Time: 0925-0955 OT Time Calculation (min): 30 min Charges:  OT General Charges $OT Visit: 1 Procedure OT Evaluation $Initial OT Evaluation Tier I: 1 Procedure OT Treatments $Self Care/Home Management : 8-22 mins G-Codes:    Haze Boyden Gelena Klosinski Nov 20, 2013, 10:09 AM [phone]

## 2013-11-19 ENCOUNTER — Other Ambulatory Visit: Payer: Self-pay | Admitting: Orthopedic Surgery

## 2013-12-05 ENCOUNTER — Encounter: Payer: Self-pay | Admitting: Pulmonary Disease

## 2013-12-05 ENCOUNTER — Ambulatory Visit (INDEPENDENT_AMBULATORY_CARE_PROVIDER_SITE_OTHER): Payer: Medicare Other | Admitting: Pulmonary Disease

## 2013-12-05 VITALS — BP 120/80 | HR 65 | Temp 97.8°F | Ht 67.0 in | Wt 220.8 lb

## 2013-12-05 DIAGNOSIS — G4733 Obstructive sleep apnea (adult) (pediatric): Secondary | ICD-10-CM

## 2013-12-05 NOTE — Progress Notes (Signed)
   Subjective:    Patient ID: [name], male    DOB: 1951/01/23, 63 y.o.   MRN: [id number]  HPI Patient comes in today for followup of his obstructive sleep apnea.  He is wearing CPAP compliantly, and feels that he sleeps well with the device. He is satisfied with his daytime alertness, but he does have sleepiness from his chronic pain medication.  He has been keeping up with his mask changes, and has lost 9 pounds since last visit.   Review of Systems  Constitutional: Negative for fever and unexpected weight change.  HENT: Negative for congestion, dental problem, ear pain, nosebleeds, postnasal drip, rhinorrhea, sinus pressure, sneezing, sore throat and trouble swallowing.   Eyes: Negative for redness and itching.  Respiratory: Negative for cough, chest tightness, shortness of breath and wheezing.   Cardiovascular: Negative for palpitations and leg swelling.  Gastrointestinal: Negative for nausea and vomiting.  Genitourinary: Negative for dysuria.  Musculoskeletal: Negative for joint swelling.  Skin: Negative for rash.  Neurological: Negative for headaches.  Hematological: Does not bruise/bleed easily.  Psychiatric/Behavioral: Negative for dysphoric mood. The patient is not nervous/anxious.        Objective:   Physical Exam Overweight male in no acute distress Nose without purulence or discharge noted No skin breakdown or pressure necrosis from the CPAP mask Neck without lymphadenopathy or thyromegaly Lower extremities without significant edema, no cyanosis Alert and oriented, moves all 4 extremities.       Assessment & Plan:

## 2013-12-05 NOTE — Assessment & Plan Note (Signed)
The pt is doing very well with cpap, and feels he sleep well with the device.  He is keeping up with supplies, and has lost weight since the last visit.  He is to followup with me again in one year.

## 2013-12-05 NOTE — Patient Instructions (Signed)
Continue with cpap, and keep up with mask changes and supplies. Work on weigh loss followup with again in one year.

## 2013-12-14 NOTE — Discharge Summary (Signed)
Physician Discharge Summary   Patient ID: [name] MRN: [id number] DOB/AGE: Dec 13, 1950 63 y.o.  Admit date: 11/17/2013 Discharge date: 11/18/2013  Primary Diagnosis:  Failed right total knee arthroplasty.  Admission Diagnoses:  Past Medical History  Diagnosis Date  . Back pain, chronic     pt states he has 3 herniated disks--uses fentanyl patch for pain  . DVT (deep venous thrombosis) 2001 or 2002    right leg-required hospitalization x 8 days  . BPH (benign prostatic hyperplasia)     frequent urination, not able to hold urine well  . Arthritis     oa and ddd  . Colon polyps   . Blood transfusion without reported diagnosis 1950    rH negative  . Ulcer 1966  . Hyperlipidemia 2012  . DVT (deep venous thrombosis) 1996    right  . Sleep apnea     pt uses cpap - setting of 16  . GERD (gastroesophageal reflux disease)   . Lumbago   . Glaucoma     both eyes  . H/O hiatal hernia   . History of IBS   . Diverticulosis   . Ulcerative colitis   . History of kidney stones     x 1  . Hemorrhoid   . Complication of anesthesia     pt had spinal for a knee replacement--"woke up" during surgery--and sick after surgery  . 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   Weight as of this encounter: 103.42 kg (228 lb).  Procedure:  Procedure(s) (LRB): RIGHT KNEE POLYETHYLENE REVISION, bone graft (Right)   Consults: None  HPI: [name] is a 63 year old male, who had a right total  knee arthroplasty done over 20 years ago. Over the past 6 months to  year, he has had recurrent effusions and pain. He has developed some  instability in the knee, and his radiograph shows significant  polyethylene wear. 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 He presents now for polyethylene versus  total knee arthroplasty revision.  Laboratory Data: Admission on 11/17/2013, Discharged on 11/18/2013  Component Date Value Ref Range Status  . ABO/RH(D) 11/17/2013 O POS   Final  . Antibody Screen 11/17/2013 NEG   Final  . Sample Expiration 11/17/2013 11/20/2013   Final  . ABO/RH(D) 11/17/2013 O POS   Final  . WBC 11/18/2013 10.0  4.0 - 10.5 K/uL Final  . RBC 11/18/2013 3.85* 4.22 - 5.81 MIL/uL Final  . Hemoglobin 11/18/2013 12.2* 13.0 - 17.0 g/dL Final  . HCT 11/18/2013 34.6* 39.0 - 52.0 % Final  . MCV 11/18/2013 89.9  78.0 - 100.0 fL Final  . MCH 11/18/2013 31.7  26.0 - 34.0 pg Final  . MCHC 11/18/2013 35.3  30.0 - 36.0 g/dL Final  . RDW 11/18/2013 13.0  11.5 - 15.5 % Final  . Platelets 11/18/2013 172  150 - 400 K/uL Final  . Sodium 11/18/2013 144  137 - 147 mEq/L Final  . Potassium 11/18/2013 4.1  3.7 - 5.3 mEq/L Final  . Chloride 11/18/2013 107  96 - 112 mEq/L Final  . CO2 11/18/2013 26  19 - 32 mEq/L Final  . Glucose, Bld 11/18/2013  108* 70 - 99 mg/dL Final  . BUN 11/18/2013 14  6 - 23 mg/dL Final  . Creatinine, Ser 11/18/2013 0.99  0.50 - 1.35 mg/dL Final  . Calcium 11/18/2013 8.6  8.4 - 10.5 mg/dL Final  . GFR calc non Af Amer 11/18/2013 85* >90 mL/min Final  . GFR calc Af Amer 11/18/2013 >90  >90 mL/min Final   Comment: (NOTE)                          The eGFR has been calculated using the CKD EPI equation.                          This calculation has not been validated in all clinical situations.                          eGFR's persistently <90 mL/min signify possible Chronic Kidney                          Disease.  Hospital Outpatient Visit on 11/09/2013  Component Date Value Ref Range Status  . Color, Urine 11/09/2013 YELLOW  YELLOW Final  . APPearance 11/09/2013 CLEAR  CLEAR Final  . Specific Gravity, Urine 11/09/2013 1.008  1.005 - 1.030 Final  . pH 11/09/2013 5.0  5.0  - 8.0 Final  . Glucose, UA 11/09/2013 NEGATIVE  NEGATIVE mg/dL Final  . Hgb urine dipstick 11/09/2013 NEGATIVE  NEGATIVE Final  . Bilirubin Urine 11/09/2013 NEGATIVE  NEGATIVE Final  . Ketones, ur 11/09/2013 NEGATIVE  NEGATIVE mg/dL Final  . Protein, ur 11/09/2013 NEGATIVE  NEGATIVE mg/dL Final  . Urobilinogen, UA 11/09/2013 0.2  0.0 - 1.0 mg/dL Final  . Nitrite 11/09/2013 NEGATIVE  NEGATIVE Final  . Leukocytes, UA 11/09/2013 NEGATIVE  NEGATIVE Final   MICROSCOPIC NOT DONE ON URINES WITH NEGATIVE PROTEIN, BLOOD, LEUKOCYTES, NITRITE, OR GLUCOSE <1000 mg/dL.  Marland Kitchen MRSA, PCR 11/09/2013 NEGATIVE  NEGATIVE Final  . Staphylococcus aureus 11/09/2013 NEGATIVE  NEGATIVE Final   Comment:                                 The Xpert SA Assay (FDA                          approved for NASAL specimens                          in patients over 36 years of age),                          is one component of                          a comprehensive surveillance                          program.  Test performance has                          been validated by [employer] for patients greater                          than or equal to 27 year old.                          It is not intended                          to diagnose infection nor to                          guide or monitor treatment.  Marland Kitchen aPTT 11/09/2013 30  24 - 37 seconds Final  . WBC 11/09/2013 5.5  4.0 - 10.5 K/uL Final  . RBC 11/09/2013 4.78  4.22 - 5.81 MIL/uL Final  . Hemoglobin 11/09/2013 15.2  13.0 - 17.0 g/dL Final  . HCT 11/09/2013 42.7  39.0 - 52.0 % Final  . MCV 11/09/2013 89.3  78.0 - 100.0 fL Final  . MCH 11/09/2013 31.8  26.0 - 34.0 pg Final  . MCHC 11/09/2013 35.6  30.0 - 36.0 g/dL Final  . RDW 11/09/2013 12.7  11.5 - 15.5 % Final  . Platelets 11/09/2013 165  150 - 400 K/uL Final  . Sodium 11/09/2013 141  137 - 147 mEq/L Final  . Potassium 11/09/2013 4.6  3.7 - 5.3 mEq/L Final  . Chloride 11/09/2013 102   96 - 112 mEq/L Final  . CO2 11/09/2013 28  19 - 32 mEq/L Final  . Glucose, Bld 11/09/2013 105* 70 - 99 mg/dL Final  . BUN 11/09/2013 14  6 - 23 mg/dL Final  . Creatinine, Ser 11/09/2013 0.96  0.50 - 1.35 mg/dL Final  . Calcium 11/09/2013 9.7  8.4 - 10.5 mg/dL Final  . Total Protein 11/09/2013 7.4  6.0 - 8.3 g/dL Final  . Albumin 11/09/2013 4.3  3.5 - 5.2 g/dL Final  . AST 11/09/2013 27  0 - 37 U/L Final  . ALT 11/09/2013 31  0 - 53 U/L Final  . Alkaline Phosphatase 11/09/2013 84  39 - 117 U/L Final  . Total Bilirubin 11/09/2013 0.4  0.3 - 1.2 mg/dL Final  . GFR calc non Af Amer 11/09/2013 86* >90 mL/min Final  . GFR calc Af Amer 11/09/2013 >90  >90 mL/min Final   Comment: (NOTE)                          The eGFR has been calculated using the CKD EPI equation.                          This calculation has not been validated in all clinical situations.                          eGFR's persistently <90 mL/min signify possible Chronic Kidney                          Disease.  Marland Kitchen Prothrombin Time 11/09/2013 12.8  11.6 - 15.2 seconds Final  . INR 11/09/2013 0.98  0.00 - 1.49 Final     X-Rays:No results found.  EKG:No orders found for this or any previous visit.   Hospital Course: [name] is a 63 y.o.  who was admitted to [facility]. They were brought to the operating room on 11/17/2013 and underwent Procedure(s): RIGHT KNEE POLYETHYLENE REVISION, bone graft.  Patient tolerated the procedure well and was later transferred to the recovery room and then to the orthopaedic floor for postoperative care.  They were given PO and IV analgesics for pain control following their surgery.  They were given 24 hours of postoperative antibiotics of  Anti-infectives   Start     Dose/Rate Route Frequency Ordered Stop   11/17/13 1400  ceFAZolin (ANCEF) IVPB 2 g/50 mL premix     2 g 100 mL/hr over 30 Minutes Intravenous Every 6 hours 11/17/13 1020 11/17/13 2009   11/17/13 0524  ceFAZolin  (ANCEF) IVPB 2 g/50 mL premix     2 g 100 mL/hr over 30 Minutes Intravenous On call to O.R. 11/17/13 0524 11/17/13 0730     and started on DVT prophylaxis in the form of Xarelto.   PT and OT were ordered for total joint protocol.  Discharge planning consulted to help with postop disposition and equipment needs.  Patient had a decent night on the evening of surgery.  They started to get up OOB with therapy on day one. Hemovac drain was pulled without difficulty.  Patient was seen in rounds and was ready to go home later that afternoon.   Diet: Regular diet Activity:WBAT Follow-up:in 2 weeks Disposition - Home Discharged Condition: good    Future Appointments Provider Department Dept Phone   03/19/2014 8:00 AM [name], MD [place] [phone]   12/06/2014 10:15 AM Kathee Delton, MD [facility] [phone]       Medication List    STOP taking these medications       aspirin 81 MG tablet     multivitamin with minerals Tabs tablet      TAKE these medications       CALCIUM + D PO  Take 1 tablet by mouth daily.     fentaNYL 25 MCG/HR patch  Commonly known as:  Nolic - dosed mcg/hr  Place 1 patch onto the skin every 3 (three) days. For chronic back pain     fish oil-omega-3 fatty acids 1000 MG capsule  Take 3 g by mouth daily.     fluticasone 50 MCG/ACT nasal spray  Commonly known as:  FLONASE  Place 1 spray into the nose every evening.     methocarbamol 500 MG tablet  Commonly known as:  ROBAXIN  Take 1 tablet (500 mg total) by mouth every 6 (six) hours as needed for muscle spasms.     oxyCODONE 5 MG immediate release tablet  Commonly known as:  Oxy IR/ROXICODONE  Take 1-2 tablets (5-10 mg total) by mouth every 3 (three) hours as needed for breakthrough pain.     sertraline 50 MG tablet  Commonly known as:  ZOLOFT  Take 50 mg by mouth daily after breakfast.     SYSTANE BALANCE 0.6 % Soln  Generic drug:  Propylene Glycol    Place 1 drop into both eyes 2 (two) times daily.     Travoprost (BAK Free) 0.004 % Soln ophthalmic solution  Commonly known as:  TRAVATAN  Place 1 drop into both eyes at bedtime.     traZODone 50 MG tablet  Commonly known as:  DESYREL  Take 1 tablet by mouth at bedtime as needed.           Follow-up Information   Follow up  with [name], MD. Schedule an appointment as soon as possible for a visit on 11/28/2013. (Call [phone] Monday to make the appointment)    Specialty:  Orthopedic Surgery   Contact information:   [address] [place] [address] [phone]       Signed: Arlee Muslim, PA-C Orthopaedic Surgery 12/14/2013, 10:13 AM

## 2014-01-01 ENCOUNTER — Encounter (HOSPITAL_BASED_OUTPATIENT_CLINIC_OR_DEPARTMENT_OTHER): Payer: Self-pay | Admitting: *Deleted

## 2014-01-01 NOTE — Progress Notes (Signed)
Just had total knee-4/15-had this nail done 2014-cancer

## 2014-01-04 ENCOUNTER — Encounter (HOSPITAL_BASED_OUTPATIENT_CLINIC_OR_DEPARTMENT_OTHER): Payer: Medicare Other | Admitting: Anesthesiology

## 2014-01-04 ENCOUNTER — Ambulatory Visit (HOSPITAL_BASED_OUTPATIENT_CLINIC_OR_DEPARTMENT_OTHER): Payer: Medicare Other | Admitting: Anesthesiology

## 2014-01-04 ENCOUNTER — Encounter (HOSPITAL_BASED_OUTPATIENT_CLINIC_OR_DEPARTMENT_OTHER)
Admission: RE | Disposition: A | Discharge status: Home / Self Care | Payer: Self-pay | Source: Ambulatory Visit | Attending: Orthopedic Surgery

## 2014-01-04 ENCOUNTER — Encounter (HOSPITAL_BASED_OUTPATIENT_CLINIC_OR_DEPARTMENT_OTHER): Payer: Self-pay

## 2014-01-04 ENCOUNTER — Ambulatory Visit (HOSPITAL_BASED_OUTPATIENT_CLINIC_OR_DEPARTMENT_OTHER)
Admission: RE | Admit: 2014-01-04 | Discharge: 2014-01-04 | Disposition: A | Discharge status: Home / Self Care | Payer: Medicare Other | Source: Ambulatory Visit | Attending: Orthopedic Surgery | Admitting: Orthopedic Surgery

## 2014-01-04 DIAGNOSIS — D0460 Carcinoma in situ of skin of unspecified upper limb, including shoulder: Secondary | ICD-10-CM | POA: Insufficient documentation

## 2014-01-04 DIAGNOSIS — Z79899 Other long term (current) drug therapy: Secondary | ICD-10-CM | POA: Insufficient documentation

## 2014-01-04 DIAGNOSIS — K279 Peptic ulcer, site unspecified, unspecified as acute or chronic, without hemorrhage or perforation: Secondary | ICD-10-CM | POA: Insufficient documentation

## 2014-01-04 DIAGNOSIS — M1990 Unspecified osteoarthritis, unspecified site: Secondary | ICD-10-CM | POA: Insufficient documentation

## 2014-01-04 DIAGNOSIS — G4730 Sleep apnea, unspecified: Secondary | ICD-10-CM | POA: Insufficient documentation

## 2014-01-04 DIAGNOSIS — Z7982 Long term (current) use of aspirin: Secondary | ICD-10-CM | POA: Insufficient documentation

## 2014-01-04 DIAGNOSIS — H409 Unspecified glaucoma: Secondary | ICD-10-CM | POA: Insufficient documentation

## 2014-01-04 DIAGNOSIS — Z87891 Personal history of nicotine dependence: Secondary | ICD-10-CM | POA: Insufficient documentation

## 2014-01-04 DIAGNOSIS — Z86718 Personal history of other venous thrombosis and embolism: Secondary | ICD-10-CM | POA: Insufficient documentation

## 2014-01-04 DIAGNOSIS — C4010 Malignant neoplasm of short bones of unspecified upper limb: Secondary | ICD-10-CM | POA: Insufficient documentation

## 2014-01-04 DIAGNOSIS — K219 Gastro-esophageal reflux disease without esophagitis: Secondary | ICD-10-CM | POA: Insufficient documentation

## 2014-01-04 DIAGNOSIS — Z96659 Presence of unspecified artificial knee joint: Secondary | ICD-10-CM | POA: Insufficient documentation

## 2014-01-04 HISTORY — DX: Presence of spectacles and contact lenses: Z97.3

## 2014-01-04 HISTORY — DX: Presence of external hearing-aid: Z97.4

## 2014-01-04 HISTORY — PX: MASS EXCISION: SHX2000

## 2014-01-04 LAB — POCT HEMOGLOBIN-HEMACUE: Hemoglobin: 16 g/dL (ref 13.0–17.0)

## 2014-01-04 SURGERY — EXCISION MASS
Anesthesia: General | Site: Finger | Laterality: Left

## 2014-01-04 MED ORDER — MIDAZOLAM HCL 5 MG/5ML IJ SOLN
INTRAMUSCULAR | Status: DC | PRN
  Administered 2014-01-04: 2 mg via INTRAVENOUS

## 2014-01-04 MED ORDER — BUPIVACAINE HCL (PF) 0.25 % IJ SOLN
INTRAMUSCULAR | Status: AC
  Filled 2014-01-04: qty 30

## 2014-01-04 MED ORDER — HYDROMORPHONE HCL PF 1 MG/ML IJ SOLN
INTRAMUSCULAR | Status: AC
  Filled 2014-01-04: qty 1

## 2014-01-04 MED ORDER — ONDANSETRON HCL 4 MG/2ML IJ SOLN
INTRAMUSCULAR | Status: DC | PRN
  Administered 2014-01-04: 4 mg via INTRAVENOUS

## 2014-01-04 MED ORDER — OXYCODONE HCL 5 MG PO TABS: 5.0000 mg | ORAL_TABLET | Freq: Once | ORAL | Status: DC | PRN

## 2014-01-04 MED ORDER — OXYCODONE HCL 5 MG PO TABS: 5.0000 mg | ORAL_TABLET | ORAL | Status: DC | PRN

## 2014-01-04 MED ORDER — LACTATED RINGERS IV SOLN: INTRAVENOUS | Status: DC

## 2014-01-04 MED ORDER — LIDOCAINE HCL (CARDIAC) 20 MG/ML IV SOLN
INTRAVENOUS | Status: DC | PRN
  Administered 2014-01-04: 40 mg via INTRAVENOUS

## 2014-01-04 MED ORDER — PROPOFOL 10 MG/ML IV BOLUS
INTRAVENOUS | Status: DC | PRN
  Administered 2014-01-04: 150 mg via INTRAVENOUS

## 2014-01-04 MED ORDER — FENTANYL CITRATE 0.05 MG/ML IJ SOLN: 50.0000 ug | INTRAMUSCULAR | Status: DC | PRN

## 2014-01-04 MED ORDER — LIDOCAINE HCL (PF) 1 % IJ SOLN
INTRAMUSCULAR | Status: AC
  Filled 2014-01-04: qty 30

## 2014-01-04 MED ORDER — CEFAZOLIN SODIUM-DEXTROSE 2-3 GM-% IV SOLR
2.0000 g | INTRAVENOUS | Status: AC
  Administered 2014-01-04: 2 g via INTRAVENOUS

## 2014-01-04 MED ORDER — OXYCODONE HCL 5 MG/5ML PO SOLN: 5.0000 mg | Freq: Once | ORAL | Status: DC | PRN

## 2014-01-04 MED ORDER — MIDAZOLAM HCL 2 MG/2ML IJ SOLN: 1.0000 mg | INTRAMUSCULAR | Status: DC | PRN

## 2014-01-04 MED ORDER — BUPIVACAINE HCL (PF) 0.5 % IJ SOLN
INTRAMUSCULAR | Status: AC
  Filled 2014-01-04: qty 30

## 2014-01-04 MED ORDER — CEFAZOLIN SODIUM-DEXTROSE 2-3 GM-% IV SOLR
INTRAVENOUS | Status: AC
  Filled 2014-01-04: qty 50

## 2014-01-04 MED ORDER — FENTANYL CITRATE 0.05 MG/ML IJ SOLN
INTRAMUSCULAR | Status: DC | PRN
  Administered 2014-01-04: 100 ug via INTRAVENOUS
  Administered 2014-01-04: 50 ug via INTRAVENOUS

## 2014-01-04 MED ORDER — CHLORHEXIDINE GLUCONATE 4 % EX LIQD: 60.0000 mL | Freq: Once | CUTANEOUS | Status: DC

## 2014-01-04 MED ORDER — DEXAMETHASONE SODIUM PHOSPHATE 4 MG/ML IJ SOLN
INTRAMUSCULAR | Status: DC | PRN
  Administered 2014-01-04: 10 mg via INTRAVENOUS

## 2014-01-04 MED ORDER — LACTATED RINGERS IV SOLN
INTRAVENOUS | Status: DC
  Administered 2014-01-04 (×2): via INTRAVENOUS

## 2014-01-04 MED ORDER — CEPHALEXIN 500 MG PO CAPS: 500.0000 mg | ORAL_CAPSULE | Freq: Four times a day (QID) | ORAL | Status: DC

## 2014-01-04 MED ORDER — PROPOFOL 10 MG/ML IV EMUL
INTRAVENOUS | Status: AC
  Filled 2014-01-04: qty 50

## 2014-01-04 MED ORDER — FENTANYL CITRATE 0.05 MG/ML IJ SOLN
INTRAMUSCULAR | Status: AC
  Filled 2014-01-04: qty 4

## 2014-01-04 MED ORDER — HYDROMORPHONE HCL PF 1 MG/ML IJ SOLN
0.2500 mg | INTRAMUSCULAR | Status: DC | PRN
  Administered 2014-01-04: 0.5 mg via INTRAVENOUS

## 2014-01-04 MED ORDER — SODIUM BICARBONATE 4 % IV SOLN
INTRAVENOUS | Status: AC
  Filled 2014-01-04: qty 5

## 2014-01-04 MED ORDER — MIDAZOLAM HCL 2 MG/2ML IJ SOLN
INTRAMUSCULAR | Status: AC
  Filled 2014-01-04: qty 2

## 2014-01-04 MED ORDER — BUPIVACAINE HCL (PF) 0.5 % IJ SOLN
INTRAMUSCULAR | Status: DC | PRN
  Administered 2014-01-04: 8 mL

## 2014-01-04 SURGICAL SUPPLY — 63 items
BANDAGE COBAN STERILE 2 (GAUZE/BANDAGES/DRESSINGS) ×2 IMPLANT
BANDAGE ELASTIC 3 VELCRO ST LF (GAUZE/BANDAGES/DRESSINGS) IMPLANT
BLADE SURG 15 STRL LF DISP TIS (BLADE) ×2 IMPLANT
BLADE SURG 15 STRL SS (BLADE) ×6
BNDG COHESIVE 1X5 TAN STRL LF (GAUZE/BANDAGES/DRESSINGS) ×2 IMPLANT
BNDG COHESIVE 3X5 TAN STRL LF (GAUZE/BANDAGES/DRESSINGS) IMPLANT
BNDG CONFORM 2 STRL LF (GAUZE/BANDAGES/DRESSINGS) ×4 IMPLANT
BNDG CONFORM 3 STRL LF (GAUZE/BANDAGES/DRESSINGS) ×1 IMPLANT
BNDG GAUZE ELAST 4 BULKY (GAUZE/BANDAGES/DRESSINGS) IMPLANT
BRUSH SCRUB EZ PLAIN DRY (MISCELLANEOUS) ×3 IMPLANT
CLOSURE WOUND 1/2 X4 (GAUZE/BANDAGES/DRESSINGS)
CORDS BIPOLAR (ELECTRODE) ×3 IMPLANT
COVER MAYO STAND STRL (DRAPES) ×3 IMPLANT
COVER TABLE BACK 60X90 (DRAPES) ×3 IMPLANT
CUFF TOURNIQUET SINGLE 18IN (TOURNIQUET CUFF) IMPLANT
DECANTER SPIKE VIAL GLASS SM (MISCELLANEOUS) IMPLANT
DRAPE EXTREMITY T 121X128X90 (DRAPE) ×3 IMPLANT
DRAPE SURG 17X23 STRL (DRAPES) ×3 IMPLANT
DRSG EMULSION OIL 3X3 NADH (GAUZE/BANDAGES/DRESSINGS) ×3 IMPLANT
GAUZE SPONGE 4X4 12PLY STRL (GAUZE/BANDAGES/DRESSINGS) IMPLANT
GAUZE XEROFORM 1X8 LF (GAUZE/BANDAGES/DRESSINGS) ×2 IMPLANT
GLOVE BIO SURGEON STRL SZ7.5 (GLOVE) ×2 IMPLANT
GLOVE BIOGEL M STRL SZ7.5 (GLOVE) ×3 IMPLANT
GLOVE BIOGEL PI IND STRL 8 (GLOVE) IMPLANT
GLOVE BIOGEL PI INDICATOR 8 (GLOVE) ×2
GLOVE SS BIOGEL STRL SZ 8 (GLOVE) ×1 IMPLANT
GLOVE SUPERSENSE BIOGEL SZ 8 (GLOVE) ×2
GOWN STRL REUS W/ TWL LRG LVL3 (GOWN DISPOSABLE) ×1 IMPLANT
GOWN STRL REUS W/ TWL XL LVL3 (GOWN DISPOSABLE) ×1 IMPLANT
GOWN STRL REUS W/TWL LRG LVL3 (GOWN DISPOSABLE) ×3
GOWN STRL REUS W/TWL XL LVL3 (GOWN DISPOSABLE) ×5 IMPLANT
LOOP VESSEL MAXI BLUE (MISCELLANEOUS) IMPLANT
NDL HYPO 25X1 1.5 SAFETY (NEEDLE) ×1 IMPLANT
NEEDLE HYPO 22GX1.5 SAFETY (NEEDLE) IMPLANT
NEEDLE HYPO 25X1 1.5 SAFETY (NEEDLE) ×3 IMPLANT
NS IRRIG 1000ML POUR BTL (IV SOLUTION) ×3 IMPLANT
PACK BASIN DAY SURGERY FS (CUSTOM PROCEDURE TRAY) ×3 IMPLANT
PAD ALCOHOL SWAB (MISCELLANEOUS) IMPLANT
PAD CAST 3X4 CTTN HI CHSV (CAST SUPPLIES) IMPLANT
PADDING CAST ABS 3INX4YD NS (CAST SUPPLIES)
PADDING CAST ABS 4INX4YD NS (CAST SUPPLIES)
PADDING CAST ABS COTTON 3X4 (CAST SUPPLIES) IMPLANT
PADDING CAST ABS COTTON 4X4 ST (CAST SUPPLIES) IMPLANT
PADDING CAST COTTON 3X4 STRL (CAST SUPPLIES)
SPLINT FIBERGLASS 3X35 (CAST SUPPLIES) IMPLANT
SPLINT FINGER 5.25 ALUM (CAST SUPPLIES) ×3
SPLINT FINGER 5/8X5.25 (CAST SUPPLIES) IMPLANT
SPLINT PLASTER CAST XFAST 3X15 (CAST SUPPLIES) IMPLANT
SPLINT PLASTER CAST XFAST 4X15 (CAST SUPPLIES) IMPLANT
SPLINT PLASTER XTRA FAST SET 4 (CAST SUPPLIES)
SPLINT PLASTER XTRA FASTSET 3X (CAST SUPPLIES)
STOCKINETTE 4X48 STRL (DRAPES) ×3 IMPLANT
STOCKINETTE SYNTHETIC 3 UNSTER (CAST SUPPLIES) IMPLANT
STOCKINETTE SYNTHETIC 4 NONSTR (MISCELLANEOUS) IMPLANT
STRIP CLOSURE SKIN 1/2X4 (GAUZE/BANDAGES/DRESSINGS) IMPLANT
SUT CHROMIC 5 0 P 3 (SUTURE) ×2 IMPLANT
SUT PROLENE 4 0 PS 2 18 (SUTURE) ×3 IMPLANT
SUT PROLENE 5 0 P 3 (SUTURE) IMPLANT
SYR BULB 3OZ (MISCELLANEOUS) ×3 IMPLANT
SYR CONTROL 10ML LL (SYRINGE) ×3 IMPLANT
TOWEL OR 17X24 6PK STRL BLUE (TOWEL DISPOSABLE) ×3 IMPLANT
TOWEL OR NON WOVEN STRL DISP B (DISPOSABLE) ×3 IMPLANT
UNDERPAD 30X30 INCONTINENT (UNDERPADS AND DIAPERS) ×3 IMPLANT

## 2014-01-04 NOTE — Anesthesia Procedure Notes (Signed)
Procedure Name: LMA Insertion Date/Time: 01/04/2014 2:01 PM Performed by: Maryella Shivers Pre-anesthesia Checklist: Patient identified, Emergency Drugs available, Suction available and Patient being monitored Patient Re-evaluated:Patient Re-evaluated prior to inductionOxygen Delivery Method: Circle System Utilized Preoxygenation: Pre-oxygenation with 100% oxygen Intubation Type: IV induction Ventilation: Mask ventilation without difficulty LMA: LMA inserted LMA Size: 5.0 Number of attempts: 1 Airway Equipment and Method: bite block Placement Confirmation: positive ETCO2 Tube secured with: Tape Dental Injury: Teeth and Oropharynx as per pre-operative assessment

## 2014-01-04 NOTE — Transfer of Care (Signed)
Immediate Anesthesia Transfer of Care Note  Patient: [name]  Procedure(s) Performed: Procedure(s): LEFT MIDDLE FINGER MASS EXCISION WITH NAILBED REPAIR AND ADVANCEMENT FLAP (Left)  Patient Location: PACU  Anesthesia Type:General  Level of Consciousness: awake, alert  and oriented  Airway & Oxygen Therapy: Patient Spontanous Breathing and Patient connected to face mask oxygen  Post-op Assessment: Report given to PACU RN and Post -op Vital signs reviewed and stable  Post vital signs: Reviewed and stable  Complications: No apparent anesthesia complications

## 2014-01-04 NOTE — Anesthesia Postprocedure Evaluation (Signed)
  Anesthesia Post-op Note  Patient: [name]  Procedure(s) Performed: Procedure(s): LEFT MIDDLE FINGER MASS EXCISION WITH NAILBED REPAIR AND ADVANCEMENT FLAP (Left)  Patient Location: PACU  Anesthesia Type:General  Level of Consciousness: awake and alert   Airway and Oxygen Therapy: Patient Spontanous Breathing  Post-op Pain: mild  Post-op Assessment: Post-op Vital signs reviewed, Patient's Cardiovascular Status Stable and Respiratory Function Stable  Post-op Vital Signs: Reviewed  Filed Vitals:   01/04/14 1607  BP: 141/61  Pulse: 71  Temp: 36.5 C  Resp: 16    Complications: No apparent anesthesia complications

## 2014-01-04 NOTE — H&P (Signed)
[name] is an 63 y.o. male.   Chief Complaint: left middle finger lesion HPI: Pleasant male who presents with the left middle finger lesion. His previous undergone excision approximately 10-12 months ago and his had regrowth of a small area at the distal tip of his finger. Given the fact that this was a in situ squamous cell type picture I have recommended a repeat I&D and removal of the area. I discussed him risk of bleeding infection. I discussed and possible need for DIP amputation  Patient understands this and desires to proceed. Is not overly tender. He is not overly symptomatic. There is simply a lesion with a return of abnormal morphologic picture  He denies neck back chest or abdominal pain.  He denies other problems  His recently had right total knee arthroplasty and is done quite well  Past Medical History  Diagnosis Date  . Back pain, chronic     pt states he has 3 herniated disks--uses fentanyl patch for pain  . DVT (deep venous thrombosis) 2001 or 2002    right leg-required hospitalization x 8 days  . BPH (benign prostatic hyperplasia)     frequent urination, not able to hold urine well  . Arthritis     oa and ddd  . Colon polyps   . Blood transfusion without reported diagnosis 1950    rH negative  . Ulcer 1966  . Hyperlipidemia 2012  . DVT (deep venous thrombosis) 1996    right  . GERD (gastroesophageal reflux disease)   . Lumbago   . Glaucoma     both eyes  . H/O hiatal hernia   . History of IBS   . Diverticulosis   . Ulcerative colitis   . History of kidney stones     x 1  . Hemorrhoid   . Complication of anesthesia     pt had spinal for a knee replacement--"woke up" during surgery--and sick after surgery  . 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 Wears glasses   . Wears hearing aid   . Sleep apnea     pt uses cpap - setting of 16    Past Surgical History  Procedure Laterality Date  . Right wrist fusion 12/12 - pt wearing brace on the  wrist-not started physical therapy yet    . Cervical fusion 2011--pt has good range of motion neck    . Bilateral carpal tunnel release 2009    . Back surgery  2008     bone spur removed   . Joint replacement      2001 left total knee and 1985 right total knee  . Nissen fundoplication 0076    . Cholecystectomy  1994  . Appendectomy  1993  . Transurethral resection of prostate  10/09/2011    Procedure: TRANSURETHRAL RESECTION OF THE PROSTATE WITH GYRUS INSTRUMENTS;  Surgeon: Fredricka Bonine, MD;  Location: [facility];  Service: Urology;  Laterality: N/A;  . Prostate surgery  2014    TURP  . Spine surgery  2009    cervical spine fusion  . Mass removed from 2nd finger Left aug 2014    squamous carcinoma  . Uvvvp    . Total knee revision Right 11/17/2013    Procedure: RIGHT KNEE POLYETHYLENE REVISION, bone graft;  Surgeon: [name], MD;  Location: [facility];  Service: Orthopedics;  Laterality: Right;    Family History  Problem Relation Age of Onset  . Colon cancer Father   .  Bone cancer Father     deceased  . Cancer Father   . Diabetes Father   . Arthritis Mother   . Hearing loss Mother   . Hyperlipidemia Mother   . Hypertension Mother   . Deep vein thrombosis Mother   . Diabetes Sister   . Hearing loss Maternal Grandmother   . Diabetes Paternal Grandfather    Social History:  reports that he quit smoking about 30 years ago. His smoking use included Cigarettes. He has a 19 pack-year smoking history. He has never used smokeless tobacco. He reports that he drinks about 3.6 ounces of alcohol per week. He reports that he does not use illicit drugs.  Allergies:  Allergies  Allergen Reactions  . Other     Pt allergic to some tapes--pt request that we only use paper tape!!    Medications Prior to Admission  Medication Sig Dispense Refill  . bimatoprost (LUMIGAN) 0.03 % ophthalmic solution 1 drop at bedtime.      . Calcium Carbonate-Vitamin D (CALCIUM + D PO) Take 1 tablet  by mouth daily.      . fentaNYL (DURAGESIC - DOSED MCG/HR) 25 MCG/HR Place 1 patch onto the skin every 3 (three) days. For chronic back pain      . fish oil-omega-3 fatty acids 1000 MG capsule Take 3 g by mouth daily.       . fluticasone (FLONASE) 50 MCG/ACT nasal spray Place 1 spray into the nose every evening.       . methocarbamol (ROBAXIN) 500 MG tablet Take 1 tablet (500 mg total) by mouth every 6 (six) hours as needed for muscle spasms.  50 tablet  1  . Propylene Glycol (SYSTANE BALANCE) 0.6 % SOLN Place 1 drop into both eyes 2 (two) times daily.      . sertraline (ZOLOFT) 50 MG tablet Take 50 mg by mouth daily after breakfast.      . traZODone (DESYREL) 50 MG tablet Take 1 tablet by mouth at bedtime as needed.       Marland Kitchen aspirin 325 MG tablet Take 325 mg by mouth daily.        Results for orders placed during the hospital encounter of 01/04/14 (from the past 48 hour(s))  POCT HEMOGLOBIN-HEMACUE     Status: None   Collection Time    01/04/14 12:02 PM      Result Value Ref Range   Hemoglobin 16.0  13.0 - 17.0 g/dL   No results found.  Review of Systems  Respiratory: Negative.   Cardiovascular: Negative.   Gastrointestinal: Negative.   Genitourinary: Negative.   Neurological: Negative.     Blood pressure 123/77, pulse 59, temperature 97.8 F (36.6 C), temperature source Oral, resp. rate 20, height 5' 7"  (1.702 m), weight 95.709 kg (211 lb), SpO2 96.00%. Physical Exam patient has a left middle finger with abnormal skin architecture at the distal tip just beneath the nail. There is no evidence of infection. The lateral nail fold most radially has a little bit of abnormal growth characteristics. The patient I reviewed this. The patient has stable pulp tissue. Flexion is intact. Prior x-ray show no obvious bone involvement.  Assessment/Plan Patient will have the lesion removed and sent to pathology. We're going to macroscopically remove the lesion and  obtain good margins. Given the  fact that this is a recurrence we may ultimately have to perform a DIP level amputation. He understands this. He desires to proceed with removal of the mass.  The patient is alert and oriented in no acute distress the patient complains of pain in the affected upper extremity.  The patient is noted to have a normal HEENT exam.  Lung fields show equal chest expansion and no shortness of breath  abdomen exam is nontender without distention.  Lower extremity examination does not show any fracture dislocation or blood clot symptoms.  Pelvis is stable neck and back are stable and nontender  We are planning surgery for your upper extremity. The risk and benefits of surgery include risk of bleeding infection anesthesia damage to normal structures and failure of the surgery to accomplish its intended goals of relieving symptoms and restoring function with this in mind we'll going to proceed. I have specifically discussed with the patient the pre-and postoperative regime and the does and don'ts and risk and benefits in great detail. Risk and benefits of surgery also include risk of dystrophy chronic nerve pain failure of the healing process to go onto completion and other inherent risks of surgery The relavent the pathophysiology of the disease/injury process, as well as the alternatives for treatment and postoperative course of action has been discussed in great detail with the patient who desires to proceed.  We will do everything in our power to help you (the patient) restore function to the upper extremity. Is a pleasure to see this patient today.  [name] 01/04/2014, 1:48 PM

## 2014-01-04 NOTE — Op Note (Signed)
See [id number] Amedeo Plenty MD

## 2014-01-04 NOTE — Anesthesia Preprocedure Evaluation (Addendum)
Anesthesia Evaluation  Patient identified by MRN, date of birth, ID band Patient awake    Reviewed: Allergy & Precautions, H&P , NPO status , Patient's Chart, lab work & pertinent test results  Airway Mallampati: II TM Distance: >3 FB Neck ROM: Full    Dental no notable dental hx. (+) Teeth Intact, Dental Advisory Given   Pulmonary sleep apnea and Continuous Positive Airway Pressure Ventilation , former smoker,  breath sounds clear to auscultation  Pulmonary exam normal       Cardiovascular negative cardio ROS  Rhythm:Regular Rate:Normal     Neuro/Psych negative neurological ROS  negative psych ROS   GI/Hepatic Neg liver ROS, PUD, GERD-  Medicated and Controlled,  Endo/Other  negative endocrine ROS  Renal/GU negative Renal ROS  negative genitourinary   Musculoskeletal   Abdominal   Peds  Hematology negative hematology ROS (+)   Anesthesia Other Findings   Reproductive/Obstetrics negative OB ROS                          Anesthesia Physical Anesthesia Plan  ASA: III  Anesthesia Plan: General   Post-op Pain Management:    Induction: Intravenous  Airway Management Planned: LMA  Additional Equipment:   Intra-op Plan:   Post-operative Plan: Extubation in OR  Informed Consent: I have reviewed the patients History and Physical, chart, labs and discussed the procedure including the risks, benefits and alternatives for the proposed anesthesia with the patient or authorized representative who has indicated his/her understanding and acceptance.   Dental advisory given  Plan Discussed with: CRNA  Anesthesia Plan Comments:         Anesthesia Quick Evaluation

## 2014-01-04 NOTE — Discharge Instructions (Signed)
Our office will call for your followup.  Your operation went well.  Please keep the bandage clean and dry and do not remove it.  We recommend that you to take vitamin C 1000 mg a day to promote healing we also recommend that if you require her pain medicine that he take a stool softener to prevent constipation as most pain medicines will have constipation side effects. We recommend either Peri-Colace or Senokot and recommend that you also consider adding MiraLAX to prevent the constipation affects from pain medicine if you are required to use them. These medicines are over the counter and maybe purchased at a local pharmacy.   Keep bandage clean and dry.  Call for any problems.  No smoking.  Criteria for driving a car: you should be off your pain medicine for 7-8 hours, able to drive one handed(confident), thinking clearly and feeling able in your judgement to drive. Continue elevation as it will decrease swelling.  If instructed by MD move your fingers within the confines of the bandage/splint.  Use ice if instructed by your MD. Call immediately for any sudden loss of feeling in your hand/arm or change in functional abilities of the extremity.    Post Anesthesia Home Care Instructions  Activity: Get plenty of rest for the remainder of the day. A responsible adult should stay with you for 24 hours following the procedure.  For the next 24 hours, DO NOT: -Drive a car -Paediatric nurse -Drink alcoholic beverages -Take any medication unless instructed by your physician -Make any legal decisions or sign important papers.  Meals: Start with liquid foods such as gelatin or soup. Progress to regular foods as tolerated. Avoid greasy, spicy, heavy foods. If nausea and/or vomiting occur, drink only clear liquids until the nausea and/or vomiting subsides. Call your physician if vomiting continues.  Special Instructions/Symptoms: Your throat may feel dry or sore from the anesthesia or the breathing  tube placed in your throat during surgery. If this causes discomfort, gargle with warm salt water. The discomfort should disappear within 24 hours.

## 2014-01-08 ENCOUNTER — Encounter (HOSPITAL_BASED_OUTPATIENT_CLINIC_OR_DEPARTMENT_OTHER): Payer: Self-pay | Admitting: Orthopedic Surgery

## 2014-01-08 NOTE — Op Note (Signed)
NAMEHENRICK, MCGUE NO.:  [id number]  MEDICAL RECORD NO.:  [id number]  LOCATION:                                 FACILITY:  PHYSICIAN:  Satira Anis. 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  PROCEDURE: 1. Excision mass, left middle finger 1.5 x 1 cm secondary to recurrent     skin abnormality.  This would be defined as a deep mass removal     given the deep resection. 2. Excision of 80% portion of the distal phalanx, left middle finger. 3. Complex nail bed reconstruction, left middle finger. 4. Nail plate excision, left middle finger. 5. Left middle finger volar advancement flap.  SURGEON:  Satira Anis. Amedeo Plenty, M.D.  ASSISTANT:  None.  COMPLICATIONS:  None.  ANESTHESIA:  General.  TOURNIQUET TIME:  Less than an hour.  INDICATIONS:  [name] is a very pleasant male, 63 years of age.  He had a mass about the distal end of his finger with growth characteristics significant for a squamous cell in situ type phenomenon.  This was removed approximately 8-10 months ago.  He has had some degree of recurrence.  Certainly, the presentation clinically is not as impressive as his initial presentation, but there is clearly some degree of recurrent growth.  Due to this, I would recommend a wider excision, possible DIP amputation based upon intraoperative conditions once we removed his nail plate.  We have gone over these issues at length, do's and don'ts, risks and benefits, and time frame duration of recovery.  OPERATIVE PROCEDURE:  The patient was seen by myself and Anesthesia. Taken to the operative suite, counseled.  Underwent a  smooth induction of general anesthesia.  Prepped and draped in a usual sterile fashion with Betadine scrub and paint.  Following this, outline marks were made, and the patient had final time-out and pre and postop check list complete.  Tourniquet was insufflated with elevation of the arm only. Following this, I removed the nail plate.  A nail plate removal with Darci Current was performed.  Following this, I was able to outline the characteristics of the abnormal skin tissue.  I then very carefully performed en bloc excision of deep tissue 1.5 x 1 cm.  I tried to ensure that we had good margins.  I basically took a portion of the distal pulp and nail bed (sterile matrix) as well as portions of the lateral nail fold to incorporate almost a smiley face type excision in terms of its characteristic look.  This was large smile face type excision which did incorporate the lateral nail fold, as well as the nail bed, and the skin of the pulp.  I was able to do this under 4.0 loupe magnification and felt very good about my excision.  Following the excision, I sent this for specimen.  At this point in time,  wound coverage was going to be a necessary issue and thus we performed volar advancement of the scan and resection of the distal phalanx approximately 50%.  I very carefully removed the distal phalanx 50% with rongeur, sculpted this, and following excision of the distal phalanx, carefully prepared the volar flap.  The volar flap was carefully prepared without difficulty mobilizing it nicely.  This was then able to be swung up and over and sutured to the nail bed.  This was nail bed repair with volar advancement flap.  Combination of chromic and Prolene suture were used. The patient had lateral folds recreated with a sculpting technique and redundant skin tissue was excised.  I carefully laid the volar advancement flap against the side of the nail bed, but did not overstuff this.  The volar  advancement flap covered nicely.  The patient will likely lose a cosmetic look of 50% of his nail, but I think this is an excellent trade off, therefore able to not have any recurrence and keep the distal interphalangeal joint moving.  Following this, the patient then underwent very careful and cautious irrigation and then Adaptic was placed under the eponychial fold to prevent nail bed adherence.  Following this, excellent hemostasis was noted and the patient had excellent refill.  Sterile dressing of Adaptic, Xeroform, gauze, Kling, and splint was applied.  The patient tolerated this well.  There were no complicating features.  All sponge, needle, and instrument counts were reported as correct.  We will keep him still for a week, have him see Therapy in a week, and myself in 2 weeks.  These notes have been discussed.  All questions have been encouraged and answered.  He could be discharged home on oxycodone and notify me should any problems occur.  We did place 8 mL of Sensorcaine with epinephrine in the palm in the form of a flexor sheath block for postop analgesia.     Satira Anis. Amedeo Plenty, M.D.     [facility]  D:  01/04/2014  T:  01/05/2014  Job:  [id number]

## 2014-03-14 ENCOUNTER — Other Ambulatory Visit: Payer: PRIVATE HEALTH INSURANCE

## 2014-03-14 DIAGNOSIS — Z0000 Encounter for general adult medical examination without abnormal findings: Secondary | ICD-10-CM

## 2014-03-14 DIAGNOSIS — K219 Gastro-esophageal reflux disease without esophagitis: Secondary | ICD-10-CM

## 2014-03-14 DIAGNOSIS — Z79899 Other long term (current) drug therapy: Secondary | ICD-10-CM

## 2014-03-14 LAB — CBC WITH DIFFERENTIAL/PLATELET
Basophils Absolute: 0 10*3/uL (ref 0.0–0.1)
Basophils Relative: 1 % (ref 0–1)
Eosinophils Absolute: 0.2 10*3/uL (ref 0.0–0.7)
Eosinophils Relative: 4 % (ref 0–5)
HCT: 45.7 % (ref 39.0–52.0)
HEMOGLOBIN: 16.6 g/dL (ref 13.0–17.0)
LYMPHS ABS: 1.7 10*3/uL (ref 0.7–4.0)
LYMPHS PCT: 37 % (ref 12–46)
MCH: 32.3 pg (ref 26.0–34.0)
MCHC: 36.3 g/dL — ABNORMAL HIGH (ref 30.0–36.0)
MCV: 88.9 fL (ref 78.0–100.0)
Monocytes Absolute: 0.5 10*3/uL (ref 0.1–1.0)
Monocytes Relative: 11 % (ref 3–12)
NEUTROS PCT: 47 % (ref 43–77)
Neutro Abs: 2.1 10*3/uL (ref 1.7–7.7)
Platelets: 166 10*3/uL (ref 150–400)
RBC: 5.14 MIL/uL (ref 4.22–5.81)
RDW: 14.2 % (ref 11.5–15.5)
WBC: 4.5 10*3/uL (ref 4.0–10.5)

## 2014-03-14 LAB — COMPLETE METABOLIC PANEL WITH GFR
ALBUMIN: 4.4 g/dL (ref 3.5–5.2)
ALK PHOS: 67 U/L (ref 39–117)
ALT: 19 U/L (ref 0–53)
AST: 22 U/L (ref 0–37)
BUN: 13 mg/dL (ref 6–23)
CHLORIDE: 104 meq/L (ref 96–112)
CO2: 27 mEq/L (ref 19–32)
Calcium: 9.5 mg/dL (ref 8.4–10.5)
Creat: 1.1 mg/dL (ref 0.50–1.35)
GFR, EST AFRICAN AMERICAN: 82 mL/min
GFR, Est Non African American: 71 mL/min
GLUCOSE: 104 mg/dL — AB (ref 70–99)
POTASSIUM: 4.4 meq/L (ref 3.5–5.3)
SODIUM: 140 meq/L (ref 135–145)
TOTAL PROTEIN: 6.9 g/dL (ref 6.0–8.3)
Total Bilirubin: 0.6 mg/dL (ref 0.2–1.2)

## 2014-03-14 LAB — LIPID PANEL
Cholesterol: 193 mg/dL (ref 0–200)
HDL: 55 mg/dL (ref >39)
LDL Cholesterol: 126 mg/dL — ABNORMAL HIGH (ref 0–99)
Total CHOL/HDL Ratio: 3.5 Ratio
Triglycerides: 59 mg/dL (ref <150)
VLDL: 12 mg/dL (ref 0–40)

## 2014-03-19 ENCOUNTER — Encounter: Payer: Self-pay | Admitting: Family Medicine

## 2014-03-19 ENCOUNTER — Ambulatory Visit (INDEPENDENT_AMBULATORY_CARE_PROVIDER_SITE_OTHER): Payer: 59 | Admitting: Family Medicine

## 2014-03-19 VITALS — BP 110/68 | HR 60 | Temp 98.1°F | Resp 14 | Ht 67.0 in | Wt 214.0 lb

## 2014-03-19 DIAGNOSIS — Z0000 Encounter for general adult medical examination without abnormal findings: Secondary | ICD-10-CM

## 2014-03-19 NOTE — Progress Notes (Signed)
Subjective:    Patient ID: [name], male    DOB: 1950/10/04, 63 y.o.   MRN: [id number]  HPI Here for CPE.  Last colonoscopy was performed by Dr. Ardis Hughs in 2008.  Although it was clear, he recommended repeat colonoscopy in 5 years due to history of colon polyps.  This is according to [facility] records.  However, he states that he had colonosocpy last year which was clear.  Urology checked his prostate 3 months ago.  Immunizations are up to date.  Past Medical History  Diagnosis Date  . Back pain, chronic     pt states he has 3 herniated disks--uses fentanyl patch for pain  . DVT (deep venous thrombosis) 2001 or 2002    right leg-required hospitalization x 8 days  . BPH (benign prostatic hyperplasia)     frequent urination, not able to hold urine well  . Arthritis     oa and ddd  . Colon polyps   . Blood transfusion without reported diagnosis 1950    rH negative  . Ulcer 1966  . Hyperlipidemia 2012  . DVT (deep venous thrombosis) 1996    right  . GERD (gastroesophageal reflux disease)   . Lumbago   . Glaucoma     both eyes  . H/O hiatal hernia   . History of IBS   . Diverticulosis   . Ulcerative colitis   . History of kidney stones     x 1  . Hemorrhoid   . Complication of anesthesia     pt had spinal for a knee replacement--"woke up" during surgery--and sick after surgery  . 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 Wears glasses   . Wears hearing aid   . Sleep apnea     pt uses cpap - setting of 16   Past Surgical History  Procedure Laterality Date  . Right wrist fusion 12/12 - pt wearing brace on the wrist-not started physical therapy yet    . Cervical fusion 2011--pt has good range of motion neck    . Bilateral carpal tunnel release 2009    . Back surgery  2008     bone spur removed   . Joint replacement      2001 left total knee and 1985 right total knee  . Nissen fundoplication [id number]    . Cholecystectomy  1994  . Appendectomy  1993  . Transurethral  resection of prostate  10/09/2011    Procedure: TRANSURETHRAL RESECTION OF THE PROSTATE WITH GYRUS INSTRUMENTS;  Surgeon: Fredricka Bonine, MD;  Location: [facility];  Service: Urology;  Laterality: N/A;  . Prostate surgery  2014    TURP  . Spine surgery  2009    cervical spine fusion  . Mass removed from 2nd finger Left aug 2014    squamous carcinoma  . Uvvvp    . Total knee revision Right 11/17/2013    Procedure: RIGHT KNEE POLYETHYLENE REVISION, bone graft;  Surgeon: [name], MD;  Location: [facility];  Service: Orthopedics;  Laterality: Right;  . Mass excision Left 01/04/2014    Procedure: LEFT MIDDLE FINGER MASS EXCISION WITH NAILBED REPAIR AND ADVANCEMENT FLAP;  Surgeon: [name], MD;  Location: [facility];  Service: Orthopedics;  Laterality: Left;   Current Outpatient Prescriptions on File Prior to Visit  Medication Sig Dispense Refill  . aspirin 325 MG tablet Take 325 mg by mouth daily.      . bimatoprost (LUMIGAN)  0.03 % ophthalmic solution 1 drop at bedtime.      . Calcium Carbonate-Vitamin D (CALCIUM + D PO) Take 1 tablet by mouth daily.      . cephALEXin (KEFLEX) 500 MG capsule Take 1 capsule (500 mg total) by mouth 4 (four) times daily.  28 capsule  0  . fentaNYL (DURAGESIC - DOSED MCG/HR) 25 MCG/HR Place 1 patch onto the skin every 3 (three) days. For chronic back pain      . fish oil-omega-3 fatty acids 1000 MG capsule Take 3 g by mouth daily.       . fluticasone (FLONASE) 50 MCG/ACT nasal spray Place 1 spray into the nose every evening.       . methocarbamol (ROBAXIN) 500 MG tablet Take 1 tablet (500 mg total) by mouth every 6 (six) hours as needed for muscle spasms.  50 tablet  1  . oxyCODONE (OXY IR/ROXICODONE) 5 MG immediate release tablet Take 1 tablet (5 mg total) by mouth every 4 (four) hours as needed for severe pain.  50 tablet  0  . Propylene Glycol (SYSTANE BALANCE) 0.6 % SOLN Place 1 drop into both eyes 2 (two) times daily.      . sertraline  (ZOLOFT) 50 MG tablet Take 50 mg by mouth daily after breakfast.      . traZODone (DESYREL) 50 MG tablet Take 1 tablet by mouth at bedtime as needed.        No current facility-administered medications on file prior to visit.   Allergies  Allergen Reactions  . Other     Pt allergic to some tapes--pt request that we only use paper tape!!   History   Social History  . Marital Status: Single    Spouse Name: N/A    Number of Children: N/A  . Years of Education: N/A   Occupational History  . disabled    Social History Main Topics  . Smoking status: Former Smoker -- 1.00 packs/day for 19 years    Types: Cigarettes    Quit date: 08/04/1983  . Smokeless tobacco: Never Used     Comment: quit smoking 1985  . Alcohol Use: 3.6 oz/week    6 Cans of beer per week     Comment: maybe 2 or 3 beers per week  . Drug Use: No  . Sexual Activity: Not Currently   Other Topics Concern  . Not on file   Social History Narrative  . No narrative on file   Family History  Problem Relation Age of Onset  . Colon cancer Father   . Bone cancer Father     deceased  . Cancer Father   . Diabetes Father   . Arthritis Mother   . Hearing loss Mother   . Hyperlipidemia Mother   . Hypertension Mother   . Deep vein thrombosis Mother   . Diabetes Sister   . Hearing loss Maternal Grandmother   . Diabetes Paternal Grandfather       Review of Systems  All other systems reviewed and are negative.      Objective:   Physical Exam  Vitals reviewed. Constitutional: He is oriented to person, place, and time. He appears well-developed and well-nourished. No distress.  HENT:  Head: Normocephalic and atraumatic.  Right Ear: External ear normal.  Left Ear: External ear normal.  Nose: Nose normal.  Mouth/Throat: Oropharynx is clear and moist. No oropharyngeal exudate.  Eyes: Conjunctivae and EOM are normal. Pupils are equal, round, and reactive to light.  Right eye exhibits no discharge. Left eye  exhibits no discharge. No scleral icterus.  Neck: Normal range of motion. Neck supple. No JVD present. No tracheal deviation present. No thyromegaly present.  Cardiovascular: Normal rate, regular rhythm, normal heart sounds and intact distal pulses.  Exam reveals no gallop and no friction rub.   No murmur heard. Pulmonary/Chest: Effort normal and breath sounds normal. No stridor. No respiratory distress. He has no wheezes. He has no rales. He exhibits no tenderness.  Abdominal: Soft. Bowel sounds are normal. He exhibits no distension and no mass. There is no tenderness. There is no rebound and no guarding.  Musculoskeletal: Normal range of motion. He exhibits no edema and no tenderness.  Lymphadenopathy:    He has no cervical adenopathy.  Neurological: He is alert and oriented to person, place, and time. He has normal reflexes. He displays normal reflexes. No cranial nerve deficit. He exhibits normal muscle tone. Coordination normal.  Skin: Skin is warm. No rash noted. He is not diaphoretic. No erythema. No pallor.  Psychiatric: He has a normal mood and affect. His behavior is normal. Judgment and thought content normal.          Assessment & Plan:  1. Routine general medical examination at a health care facility CPE is normal.  I recommended diet, exercise and weight loss.  I reviewed the labs with the patient.  Recommended prevnar 13 next year at 37.  Otherwise, preventative care is up to date.  Follow up in 1 year or as needed.

## 2014-04-17 DIAGNOSIS — M5137 Other intervertebral disc degeneration, lumbosacral region: Secondary | ICD-10-CM | POA: Diagnosis not present

## 2014-04-17 DIAGNOSIS — M5126 Other intervertebral disc displacement, lumbar region: Secondary | ICD-10-CM | POA: Diagnosis not present

## 2014-04-17 DIAGNOSIS — M961 Postlaminectomy syndrome, not elsewhere classified: Secondary | ICD-10-CM | POA: Diagnosis not present

## 2014-05-03 ENCOUNTER — Ambulatory Visit (INDEPENDENT_AMBULATORY_CARE_PROVIDER_SITE_OTHER): Payer: Medicare Other | Admitting: Family Medicine

## 2014-05-03 DIAGNOSIS — Z23 Encounter for immunization: Secondary | ICD-10-CM | POA: Diagnosis not present

## 2014-05-08 DIAGNOSIS — M5416 Radiculopathy, lumbar region: Secondary | ICD-10-CM | POA: Diagnosis not present

## 2014-05-29 DIAGNOSIS — H4011X1 Primary open-angle glaucoma, mild stage: Secondary | ICD-10-CM | POA: Diagnosis not present

## 2014-08-08 DIAGNOSIS — Z79891 Long term (current) use of opiate analgesic: Secondary | ICD-10-CM | POA: Diagnosis not present

## 2014-08-08 DIAGNOSIS — M961 Postlaminectomy syndrome, not elsewhere classified: Secondary | ICD-10-CM | POA: Diagnosis not present

## 2014-08-13 DIAGNOSIS — M542 Cervicalgia: Secondary | ICD-10-CM | POA: Diagnosis not present

## 2014-08-13 DIAGNOSIS — M961 Postlaminectomy syndrome, not elsewhere classified: Secondary | ICD-10-CM | POA: Diagnosis not present

## 2014-08-15 DIAGNOSIS — E291 Testicular hypofunction: Secondary | ICD-10-CM | POA: Diagnosis not present

## 2014-08-15 DIAGNOSIS — Z125 Encounter for screening for malignant neoplasm of prostate: Secondary | ICD-10-CM | POA: Diagnosis not present

## 2014-08-18 DIAGNOSIS — M5126 Other intervertebral disc displacement, lumbar region: Secondary | ICD-10-CM | POA: Diagnosis not present

## 2014-08-28 DIAGNOSIS — Z96651 Presence of right artificial knee joint: Secondary | ICD-10-CM | POA: Diagnosis not present

## 2014-08-28 DIAGNOSIS — Z471 Aftercare following joint replacement surgery: Secondary | ICD-10-CM | POA: Diagnosis not present

## 2014-08-31 DIAGNOSIS — M961 Postlaminectomy syndrome, not elsewhere classified: Secondary | ICD-10-CM | POA: Diagnosis not present

## 2014-09-09 ENCOUNTER — Emergency Department (HOSPITAL_COMMUNITY): Payer: Medicare Other

## 2014-09-09 ENCOUNTER — Emergency Department (HOSPITAL_COMMUNITY)
Admission: EM | Admit: 2014-09-09 | Discharge: 2014-09-09 | Disposition: A | Discharge status: Home / Self Care | Payer: Medicare Other | Attending: Emergency Medicine | Admitting: Emergency Medicine

## 2014-09-09 ENCOUNTER — Encounter (HOSPITAL_COMMUNITY): Payer: Self-pay | Admitting: Radiology

## 2014-09-09 DIAGNOSIS — R51 Headache: Secondary | ICD-10-CM | POA: Diagnosis not present

## 2014-09-09 DIAGNOSIS — N410 Acute prostatitis: Secondary | ICD-10-CM | POA: Diagnosis not present

## 2014-09-09 DIAGNOSIS — Z86010 Personal history of colonic polyps: Secondary | ICD-10-CM | POA: Diagnosis not present

## 2014-09-09 DIAGNOSIS — R519 Headache, unspecified: Secondary | ICD-10-CM

## 2014-09-09 DIAGNOSIS — Z87891 Personal history of nicotine dependence: Secondary | ICD-10-CM | POA: Insufficient documentation

## 2014-09-09 DIAGNOSIS — R319 Hematuria, unspecified: Secondary | ICD-10-CM

## 2014-09-09 DIAGNOSIS — Z872 Personal history of diseases of the skin and subcutaneous tissue: Secondary | ICD-10-CM | POA: Insufficient documentation

## 2014-09-09 DIAGNOSIS — M1990 Unspecified osteoarthritis, unspecified site: Secondary | ICD-10-CM | POA: Diagnosis not present

## 2014-09-09 DIAGNOSIS — G8929 Other chronic pain: Secondary | ICD-10-CM | POA: Insufficient documentation

## 2014-09-09 DIAGNOSIS — K4020 Bilateral inguinal hernia, without obstruction or gangrene, not specified as recurrent: Secondary | ICD-10-CM | POA: Diagnosis not present

## 2014-09-09 DIAGNOSIS — Z9981 Dependence on supplemental oxygen: Secondary | ICD-10-CM | POA: Diagnosis not present

## 2014-09-09 DIAGNOSIS — Z7982 Long term (current) use of aspirin: Secondary | ICD-10-CM | POA: Insufficient documentation

## 2014-09-09 DIAGNOSIS — Z85828 Personal history of other malignant neoplasm of skin: Secondary | ICD-10-CM | POA: Insufficient documentation

## 2014-09-09 DIAGNOSIS — Z87442 Personal history of urinary calculi: Secondary | ICD-10-CM | POA: Diagnosis not present

## 2014-09-09 DIAGNOSIS — Z86718 Personal history of other venous thrombosis and embolism: Secondary | ICD-10-CM | POA: Diagnosis not present

## 2014-09-09 DIAGNOSIS — G4730 Sleep apnea, unspecified: Secondary | ICD-10-CM | POA: Diagnosis not present

## 2014-09-09 DIAGNOSIS — R1033 Periumbilical pain: Secondary | ICD-10-CM | POA: Diagnosis not present

## 2014-09-09 DIAGNOSIS — R509 Fever, unspecified: Secondary | ICD-10-CM

## 2014-09-09 DIAGNOSIS — N390 Urinary tract infection, site not specified: Secondary | ICD-10-CM | POA: Diagnosis not present

## 2014-09-09 DIAGNOSIS — K5730 Diverticulosis of large intestine without perforation or abscess without bleeding: Secondary | ICD-10-CM | POA: Diagnosis not present

## 2014-09-09 DIAGNOSIS — Z974 Presence of external hearing-aid: Secondary | ICD-10-CM | POA: Diagnosis not present

## 2014-09-09 DIAGNOSIS — R1030 Lower abdominal pain, unspecified: Secondary | ICD-10-CM | POA: Diagnosis present

## 2014-09-09 DIAGNOSIS — D72829 Elevated white blood cell count, unspecified: Secondary | ICD-10-CM | POA: Insufficient documentation

## 2014-09-09 DIAGNOSIS — Z79899 Other long term (current) drug therapy: Secondary | ICD-10-CM | POA: Insufficient documentation

## 2014-09-09 DIAGNOSIS — E785 Hyperlipidemia, unspecified: Secondary | ICD-10-CM | POA: Diagnosis not present

## 2014-09-09 DIAGNOSIS — R109 Unspecified abdominal pain: Secondary | ICD-10-CM

## 2014-09-09 LAB — URINALYSIS, ROUTINE W REFLEX MICROSCOPIC
GLUCOSE, UA: NEGATIVE mg/dL
HGB URINE DIPSTICK: NEGATIVE
KETONES UR: NEGATIVE mg/dL
NITRITE: NEGATIVE
Protein, ur: 100 mg/dL — AB
SPECIFIC GRAVITY, URINE: 1.028 (ref 1.005–1.030)
UROBILINOGEN UA: 1 mg/dL (ref 0.0–1.0)
pH: 8 (ref 5.0–8.0)

## 2014-09-09 LAB — COMPREHENSIVE METABOLIC PANEL
ALBUMIN: 4.2 g/dL (ref 3.5–5.2)
ALT: 28 U/L (ref 0–53)
ANION GAP: 9 (ref 5–15)
AST: 28 U/L (ref 0–37)
Alkaline Phosphatase: 71 U/L (ref 39–117)
BILIRUBIN TOTAL: 1.2 mg/dL (ref 0.3–1.2)
BUN: 17 mg/dL (ref 6–23)
CALCIUM: 9 mg/dL (ref 8.4–10.5)
CHLORIDE: 98 mmol/L (ref 96–112)
CO2: 27 mmol/L (ref 19–32)
CREATININE: 1.11 mg/dL (ref 0.50–1.35)
GFR calc Af Amer: 79 mL/min — ABNORMAL LOW (ref >90)
GFR, EST NON AFRICAN AMERICAN: 68 mL/min — AB (ref >90)
GLUCOSE: 127 mg/dL — AB (ref 70–99)
Potassium: 3.8 mmol/L (ref 3.5–5.1)
Sodium: 134 mmol/L — ABNORMAL LOW (ref 135–145)
Total Protein: 7.5 g/dL (ref 6.0–8.3)

## 2014-09-09 LAB — CBC WITH DIFFERENTIAL/PLATELET
BASOS ABS: 0 10*3/uL (ref 0.0–0.1)
Basophils Relative: 0 % (ref 0–1)
EOS PCT: 0 % (ref 0–5)
Eosinophils Absolute: 0 10*3/uL (ref 0.0–0.7)
HEMATOCRIT: 43.7 % (ref 39.0–52.0)
Hemoglobin: 15.3 g/dL (ref 13.0–17.0)
Lymphocytes Relative: 6 % — ABNORMAL LOW (ref 12–46)
Lymphs Abs: 0.9 10*3/uL (ref 0.7–4.0)
MCH: 32.6 pg (ref 26.0–34.0)
MCHC: 35 g/dL (ref 30.0–36.0)
MCV: 93.2 fL (ref 78.0–100.0)
Monocytes Absolute: 1.4 10*3/uL — ABNORMAL HIGH (ref 0.1–1.0)
Monocytes Relative: 9 % (ref 3–12)
NEUTROS PCT: 85 % — AB (ref 43–77)
Neutro Abs: 13.4 10*3/uL — ABNORMAL HIGH (ref 1.7–7.7)
Platelets: 132 10*3/uL — ABNORMAL LOW (ref 150–400)
RBC: 4.69 MIL/uL (ref 4.22–5.81)
RDW: 13 % (ref 11.5–15.5)
WBC: 15.7 10*3/uL — ABNORMAL HIGH (ref 4.0–10.5)

## 2014-09-09 LAB — URINE MICROSCOPIC-ADD ON

## 2014-09-09 LAB — LIPASE, BLOOD: LIPASE: 24 U/L (ref 11–59)

## 2014-09-09 MED ORDER — SODIUM CHLORIDE 0.9 % IV BOLUS (SEPSIS)
2000.0000 mL | Freq: Once | INTRAVENOUS | Status: AC
  Administered 2014-09-09: 2000 mL via INTRAVENOUS

## 2014-09-09 MED ORDER — HYDROCODONE-ACETAMINOPHEN 5-325 MG PO TABS: 1.0000 | ORAL_TABLET | Freq: Four times a day (QID) | ORAL | Status: DC | PRN

## 2014-09-09 MED ORDER — ACETAMINOPHEN 325 MG PO TABS
650.0000 mg | ORAL_TABLET | Freq: Once | ORAL | Status: AC
  Administered 2014-09-09: 650 mg via ORAL
  Filled 2014-09-09: qty 2

## 2014-09-09 MED ORDER — MORPHINE SULFATE 4 MG/ML IJ SOLN
4.0000 mg | Freq: Once | INTRAMUSCULAR | Status: AC
  Administered 2014-09-09: 4 mg via INTRAVENOUS
  Filled 2014-09-09: qty 1

## 2014-09-09 MED ORDER — CIPROFLOXACIN IN D5W 400 MG/200ML IV SOLN
400.0000 mg | Freq: Once | INTRAVENOUS | Status: AC
  Administered 2014-09-09: 400 mg via INTRAVENOUS
  Filled 2014-09-09: qty 200

## 2014-09-09 MED ORDER — SODIUM CHLORIDE 0.9 % IV SOLN
Freq: Once | INTRAVENOUS | Status: AC
  Administered 2014-09-09: 13:00:00 via INTRAVENOUS

## 2014-09-09 MED ORDER — CIPROFLOXACIN HCL 500 MG PO TABS: 500.0000 mg | ORAL_TABLET | Freq: Two times a day (BID) | ORAL | Status: DC

## 2014-09-09 MED ORDER — DEXTROSE 5 % IV SOLN
1.0000 g | Freq: Once | INTRAVENOUS | Status: AC
  Administered 2014-09-09: 1 g via INTRAVENOUS
  Filled 2014-09-09: qty 10

## 2014-09-09 NOTE — Discharge Instructions (Signed)
Stay very well hydrated with plenty of water throughout the day. Take antibiotic until completed. Use your home pain medications, and tylenol/motrin alternating for pain or fever. Use norco as directed as needed for severe pain unrelieved by tylenol/motrin/home pain regimen. Don't drive while taking norco. Avoid using more than 3,040m of tylenol daily, and be aware that norco contains 3276mper tablet of tylenol. Follow up with your Urologist Dr. EsJunious Silkn 1 week for recheck of ongoing symptoms but return to ER for emergent changing or worsening of symptoms. Also, your CT scan showed an incidental finding of diverticulosis and hernias. You'll need to follow up with your regular doctor for recheck of these in 1-2 weeks, and for any referrals to a gastroenterologist or surgeon that may be needed down the line. If your abdominal pain worsens abruptly, or you develop bloody diarrhea, return to the ER.  Please seek immediate care if you develop the following: You develop worsening back pain.  Your symptoms are no better, or worse in 3-5 days. There is severe back pain or lower abdominal pain.  You develop chills.  You have a fever unrelieved with tylenol/motrin There is nausea or vomiting.  There is continued burning or discomfort with urination.     Prostatitis The prostate gland is about the size and shape of a walnut. It is located just below your bladder. It produces one of the components of semen, which is made up of sperm and the fluids that help nourish and transport it out from the testicles. Prostatitis is inflammation of the prostate gland.  There are four types of prostatitis:  Acute bacterial prostatitis. This is the least common type of prostatitis. It starts quickly and usually is associated with a bladder infection, high fever, and shaking chills. It can occur at any age.  Chronic bacterial prostatitis. This is a persistent bacterial infection in the prostate. It usually develops from  repeated acute bacterial prostatitis or acute bacterial prostatitis that was not properly treated. It can occur in men of any age but is most common in middle-aged men whose prostate has begun to enlarge. The symptoms are not as severe as those in acute bacterial prostatitis. Discomfort in the part of your body that is in front of your rectum and below your scrotum (perineum), lower abdomen, or in the head of your penis (glans) may represent your primary discomfort.  Chronic prostatitis (nonbacterial). This is the most common type of prostatitis. It is inflammation of the prostate gland that is not caused by a bacterial infection. The cause is unknown and may be associated with a viral infection or autoimmune disorder.  Prostatodynia (pelvic floor disorder). This is associated with increased muscular tone in the pelvis surrounding the prostate. CAUSES The causes of bacterial prostatitis are bacterial infection. The causes of the other types of prostatitis are unknown.  SYMPTOMS  Symptoms can vary depending upon the type of prostatitis that exists. There can also be overlap in symptoms. Possible symptoms for each type of prostatitis are listed below. Acute Bacterial Prostatitis  Painful urination.  Fever or chills.  Muscle or joint pains.  Low back pain.  Low abdominal pain.  Inability to empty bladder completely. Chronic Bacterial Prostatitis, Chronic Nonbacterial Prostatitis, and Prostatodynia  Sudden urge to urinate.  Frequent urination.  Difficulty starting urine stream.  Weak urine stream.  Discharge from the urethra.  Dribbling after urination.  Rectal pain.  Pain in the testicles, penis, or tip of the penis.  Pain in the perineum.  Problems with sexual function.  Painful ejaculation.  Bloody semen. DIAGNOSIS  In order to diagnose prostatitis, your health care provider will ask about your symptoms. One or more urine samples will be taken and tested (urinalysis).  If the urinalysis result is negative for bacteria, your health care provider may use a finger to feel your prostate (digital rectal exam). This exam helps your health care provider determine if your prostate is swollen and tender. It will also produce a specimen of semen that can be analyzed. TREATMENT  Treatment for prostatitis depends on the cause. If a bacterial infection is the cause, it can be treated with antibiotic medicine. In cases of chronic bacterial prostatitis, the use of antibiotics for up to 1 month or 6 weeks may be necessary. Your health care provider may instruct you to take sitz baths to help relieve pain. A sitz bath is a bath of hot water in which your hips and buttocks are under water. This relaxes the pelvic floor muscles and often helps to relieve the pressure on your prostate. HOME CARE INSTRUCTIONS   Take all medicines as directed by your health care provider.  Take sitz baths as directed by your health care provider. SEEK MEDICAL CARE IF:   Your symptoms get worse, not better.  You have a fever. SEEK IMMEDIATE MEDICAL CARE IF:   You have chills.  You feel nauseous or vomit.  You feel lightheaded or faint.  You are unable to urinate.  You have blood or blood clots in your urine. MAKE SURE YOU:  Understand these instructions.  Will watch your condition.  Will get help right away if you are not doing well or get worse. Document Released: 07/17/2000 Document Revised: 07/25/2013 Document Reviewed: 02/06/2013 [facility] Patient Information 2015 [place], [place]. This information is not intended to replace advice given to you by your health care provider. Make sure you discuss any questions you have with your health care provider.  Hernia A hernia happens when an organ inside your body pushes out through a weak spot in your belly (abdominal) wall. Most hernias get worse over time. They can often be pushed back into place (reduced). Surgery may be needed to repair  hernias that cannot be pushed into place. HOME CARE  Keep doing normal activities.  Avoid lifting more than 10 pounds (4.5 kilograms).  Cough gently and avoid straining. Over time, these things will:  Increase your hernia size.  Irritate your hernia.  Break down hernia repairs.  Stop smoking.  Do not wear anything tight over your hernia. Do not keep the hernia in with an outside bandage.  Eat food that is high in fiber (fruit, vegetables, whole grains).  Drink enough fluids to keep your pee (urine) clear or pale yellow.  Take medicines to make your poop soft (stool softeners) if you cannot poop (constipated). GET HELP RIGHT AWAY IF:   You have a fever.  You have belly pain that gets worse.  You feel sick to your stomach (nauseous) and throw up (vomit).  Your skin starts to bulge out.  Your hernia turns a different color, feels hard, or is tender.  You have increased pain or puffiness (swelling) around the hernia.  You poop more or less often.  Your poop does not look the way normally does.  You have watery poop (diarrhea).  You cannot push the hernia back in place by applying gentle pressure while lying down. MAKE SURE YOU:   Understand these instructions.  Will watch your condition.  Will get help right away if you are not doing well or get worse. Document Released: 01/07/2010 Document Revised: 10/12/2011 Document Reviewed: 01/07/2010 [facility] Patient Information 2015 [place], [place]. This information is not intended to replace advice given to you by your health care provider. Make sure you discuss any questions you have with your health care provider.  Diverticulosis Diverticulosis is the condition that develops when small pouches (diverticula) form in the wall of your colon. Your colon, or large intestine, is where water is absorbed and stool is formed. The pouches form when the inside layer of your colon pushes through weak spots in the outer layers of your  colon. CAUSES  No one knows exactly what causes diverticulosis. RISK FACTORS  Being older than 65. Your risk for this condition increases with age. Diverticulosis is rare in people younger than 40 years. By age 46, almost everyone has it.  Eating a low-fiber diet.  Being frequently constipated.  Being overweight.  Not getting enough exercise.  Smoking.  Taking over-the-counter pain medicines, like aspirin and ibuprofen. SYMPTOMS  Most people with diverticulosis do not have symptoms. DIAGNOSIS  Because diverticulosis often has no symptoms, health care providers often discover the condition during an exam for other colon problems. In many cases, a health care provider will diagnose diverticulosis while using a flexible scope to examine the colon (colonoscopy). TREATMENT  If you have never developed an infection related to diverticulosis, you may not need treatment. If you have had an infection before, treatment may include:  Eating more fruits, vegetables, and grains.  Taking a fiber supplement.  Taking a live bacteria supplement (probiotic).  Taking medicine to relax your colon. HOME CARE INSTRUCTIONS   Drink at least 6-8 glasses of water each day to prevent constipation.  Try not to strain when you have a bowel movement.  Keep all follow-up appointments. If you have had an infection before:  Increase the fiber in your diet as directed by your health care provider or dietitian.  Take a dietary fiber supplement if your health care provider approves.  Only take medicines as directed by your health care provider. SEEK MEDICAL CARE IF:   You have abdominal pain.  You have bloating.  You have cramps.  You have not gone to the bathroom in 3 days. SEEK IMMEDIATE MEDICAL CARE IF:   Your pain gets worse.  Yourbloating becomes very bad.  You have a fever or chills, and your symptoms suddenly get worse.  You begin vomiting.  You have bowel movements that are  bloody or black. MAKE SURE YOU:  Understand these instructions.  Will watch your condition.  Will get help right away if you are not doing well or get worse. Document Released: 04/16/2004 Document Revised: 07/25/2013 Document Reviewed: 06/14/2013 [facility] Patient Information 2015 [place], [place]. This information is not intended to replace advice given to you by your health care provider. Make sure you discuss any questions you have with your health care provider.

## 2014-09-09 NOTE — ED Notes (Addendum)
Pt reports hx of kidney stones in 1990's. Reports fever at home. Noticed changes in urination starting Thursday, has chronic back pain that he wears a fentanyl patch for, but right sided back pain increased starting Friday, started having hematuria today and dysuria. Pain 10/10.

## 2014-09-09 NOTE — ED Provider Notes (Signed)
CSN: [id number]     Arrival date & time 09/09/14  1206 History   First MD Initiated Contact with Patient 09/09/14 1231     Chief Complaint  Patient presents with  . Hematuria  . Flank Pain  . Dysuria     (Consider location/radiation/quality/duration/timing/severity/associated sxs/prior Treatment) HPI Comments: EHAB HUMBER is a 64 y.o. male with a PMHx of chronic back pain, BPH, remote DVT, arthritis, HLD, GERD, hiatal hernia, diverticulosis, IBS, UC, nephrolithiasis, and hemorrhoids, and a PSHx of Nissen fundoplication, cholecystectomy, appendectomy, and TURP (2 yrs ago), who presents to the ED with complaints of hematuria, dysuria, and right-sided flank pain 2 days. He states the pain is 8/10 constant dull aching nonradiating pain worse with urination and improved with Tylenol. He also endorses some mild aching with urination and suprapubic abdominal region. He states that his urine has been malodorous and cloudy, and that initially he had a weaker stream which has improved today. He endorses a subjective oral temperature of 101.5 for which he took Tylenol at 7 AM. He states that along with his fever he developed a dull generalized headache, gradual onset, and similar to prior headaches he has had in the past. Denies vision changes, lightheadedness, dizziness, tinnitus, URI symptoms, congestion, CP, SOB, cough, N/V/D/C, obstipation, melena, hematochezia, penile discharge/swelling, testicular pain/swelling, rectal pain, myalgias, arthralgias, weakness, paresthesias, numbness, cauda equina symptoms, or rashes. Denies EtOH, NSAID use, sick contacts, recent travel, or suspicious food intake. Has seen Dr. Junious Silk in the past for urology.   Patient is a 64 y.o. male presenting with hematuria, flank pain, and dysuria. The history is provided by the patient. No language interpreter was used.  Hematuria This is a new problem. The current episode started in the past 7 days. The problem occurs constantly.  The problem has been unchanged. Associated symptoms include abdominal pain (suprapubic discomfort with urination), a fever (101.5 oral subjective at home), headaches and urinary symptoms. Pertinent negatives include no arthralgias, change in bowel habit, chest pain, chills, congestion, coughing, myalgias, nausea, neck pain, numbness, rash, sore throat, visual change, vomiting or weakness. Exacerbated by: urination. He has tried acetaminophen for the symptoms. The treatment provided moderate relief.  Flank Pain This is a new problem. The current episode started in the past 7 days. The problem occurs constantly. The problem has been unchanged. Associated symptoms include abdominal pain (suprapubic discomfort with urination), a fever (101.5 oral subjective at home), headaches and urinary symptoms. Pertinent negatives include no arthralgias, change in bowel habit, chest pain, chills, congestion, coughing, myalgias, nausea, neck pain, numbness, rash, sore throat, visual change, vomiting or weakness. Exacerbated by: urination. He has tried acetaminophen for the symptoms. The treatment provided moderate relief.  Dysuria Associated symptoms include abdominal pain (suprapubic discomfort with urination), a fever (101.5 oral subjective at home), headaches and urinary symptoms. Pertinent negatives include no arthralgias, change in bowel habit, chest pain, chills, congestion, coughing, myalgias, nausea, neck pain, numbness, rash, sore throat, visual change, vomiting or weakness.    Past Medical History  Diagnosis Date  . Back pain, chronic     pt states he has 3 herniated disks--uses fentanyl patch for pain  . DVT (deep venous thrombosis) 2001 or 2002    right leg-required hospitalization x 8 days  . BPH (benign prostatic hyperplasia)     frequent urination, not able to hold urine well  . Arthritis     oa and ddd  . Colon polyps   . Blood transfusion without reported diagnosis 1950  rH negative  . Ulcer  1966  . Hyperlipidemia 2012  . DVT (deep venous thrombosis) 1996    right  . GERD (gastroesophageal reflux disease)   . Lumbago   . Glaucoma     both eyes  . H/O hiatal hernia   . History of IBS   . Diverticulosis   . Ulcerative colitis   . History of kidney stones     x 1  . Hemorrhoid   . Complication of anesthesia     pt had spinal for a knee replacement--"woke up" during surgery--and sick after surgery  . 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 Wears glasses   . Wears hearing aid   . Sleep apnea     pt uses cpap - setting of 16   Past Surgical History  Procedure Laterality Date  . Right wrist fusion 12/12 - pt wearing brace on the wrist-not started physical therapy yet    . Cervical fusion 2011--pt has good range of motion neck    . Bilateral carpal tunnel release 2009    . Back surgery  2008     bone spur removed   . Joint replacement      2001 left total knee and 1985 right total knee  . Nissen fundoplication 7353    . Cholecystectomy  1994  . Appendectomy  1993  . Transurethral resection of prostate  10/09/2011    Procedure: TRANSURETHRAL RESECTION OF THE PROSTATE WITH GYRUS INSTRUMENTS;  Surgeon: Fredricka Bonine, MD;  Location: [facility];  Service: Urology;  Laterality: N/A;  . Prostate surgery  2014    TURP  . Spine surgery  2009    cervical spine fusion  . Mass removed from 2nd finger Left aug 2014    squamous carcinoma  . Uvvvp    . Total knee revision Right 11/17/2013    Procedure: RIGHT KNEE POLYETHYLENE REVISION, bone graft;  Surgeon: [name], MD;  Location: [facility];  Service: Orthopedics;  Laterality: Right;  . Mass excision Left 01/04/2014    Procedure: LEFT MIDDLE FINGER MASS EXCISION WITH NAILBED REPAIR AND ADVANCEMENT FLAP;  Surgeon: [name], MD;  Location: [facility];  Service: Orthopedics;  Laterality: Left;   Family History  Problem Relation Age of Onset  . Colon cancer Father   . Bone cancer Father      deceased  . Cancer Father   . Diabetes Father   . Arthritis Mother   . Hearing loss Mother   . Hyperlipidemia Mother   . Hypertension Mother   . Deep vein thrombosis Mother   . Diabetes Sister   . Hearing loss Maternal Grandmother   . Diabetes Paternal Grandfather    History  Substance Use Topics  . Smoking status: Former Smoker -- 1.00 packs/day for 19 years    Types: Cigarettes    Quit date: 08/04/1983  . Smokeless tobacco: Never Used     Comment: quit smoking 1985  . Alcohol Use: 3.6 oz/week    6 Cans of beer per week     Comment: maybe 2 or 3 beers per week    Review of Systems  Constitutional: Positive for fever (101.5 oral subjective at home). Negative for chills.  HENT: Negative for congestion and sore throat.   Eyes: Negative for photophobia, pain and visual disturbance.  Respiratory: Negative for cough and shortness of breath.   Cardiovascular: Negative for chest pain.  Gastrointestinal: Positive for abdominal pain (  suprapubic discomfort with urination). Negative for nausea, vomiting, diarrhea, constipation, blood in stool, rectal pain and change in bowel habit.  Genitourinary: Positive for dysuria, hematuria and flank pain. Negative for urgency, discharge, penile swelling, scrotal swelling, penile pain and testicular pain.  Musculoskeletal: Negative for myalgias, arthralgias, neck pain and neck stiffness.  Skin: Negative for rash.  Allergic/Immunologic: Negative for immunocompromised state.  Neurological: Positive for headaches. Negative for dizziness, weakness, light-headedness and numbness.  Psychiatric/Behavioral: Negative for confusion.   10 Systems reviewed and are negative for acute change except as noted in the HPI.    Allergies  Other  Home Medications   Prior to Admission medications   Medication Sig Start Date End Date Taking? Authorizing Provider  acetaminophen (TYLENOL) 650 MG CR tablet Take 1,300 mg by mouth every 8 (eight) hours as needed for  pain.   Yes Historical Provider, MD  anastrozole (ARIMIDEX) 1 MG tablet Take 1 mg by mouth. 1 tab po mon & thurs   Yes Historical Provider, MD  aspirin 81 MG tablet Take 81 mg by mouth daily.   Yes Historical Provider, MD  bimatoprost (LUMIGAN) 0.03 % ophthalmic solution 1 drop at bedtime.   Yes Historical Provider, MD  Calcium Carbonate-Vitamin D (CALCIUM + D PO) Take 1 tablet by mouth daily.   Yes Historical Provider, MD  fentaNYL (DURAGESIC - DOSED MCG/HR) 25 MCG/HR Place 1 patch onto the skin every 3 (three) days. For chronic back pain   Yes Historical Provider, MD  fish oil-omega-3 fatty acids 1000 MG capsule Take 3 g by mouth daily.    Yes Historical Provider, MD  fluticasone (FLONASE) 50 MCG/ACT nasal spray Place 1 spray into the nose every evening.    Yes Historical Provider, MD  Multiple Vitamin (MULTIVITAMIN WITH MINERALS) TABS tablet Take 1 tablet by mouth daily.   Yes Historical Provider, MD  Propylene Glycol (SYSTANE BALANCE) 0.6 % SOLN Place 1 drop into both eyes 2 (two) times daily.   Yes Historical Provider, MD  sertraline (ZOLOFT) 50 MG tablet Take 50 mg by mouth daily after breakfast.   Yes Historical Provider, MD  traZODone (DESYREL) 50 MG tablet Take 1 tablet by mouth at bedtime as needed.  09/30/12  Yes Historical Provider, MD   BP 151/78 mmHg  Pulse 97  Temp(Src) 99 F (37.2 C) (Oral)  Resp 20  SpO2 96% Physical Exam  Constitutional: He is oriented to person, place, and time. Vital signs are normal. He appears well-developed and well-nourished.  Non-toxic appearance. No distress.  Afebrile, nontoxic, NAD  HENT:  Head: Normocephalic and atraumatic.  Mouth/Throat: Oropharynx is clear and moist. Mucous membranes are dry.  Mildly dry mucous membranes  Eyes: Conjunctivae and EOM are normal. Pupils are equal, round, and reactive to light. Right eye exhibits no discharge. Left eye exhibits no discharge.  PERRL, EOMI without nystagmus  Neck: Normal range of motion. Neck  supple. No spinous process tenderness and no muscular tenderness present. No rigidity. Normal range of motion present.  FROM intact without spinous process or paraspinous muscle TTP, no bony stepoffs or deformities, no muscle spasms. No rigidity or meningeal signs. No bruising or swelling.   Cardiovascular: Normal rate, regular rhythm, normal heart sounds and intact distal pulses.  Exam reveals no gallop and no friction rub.   No murmur heard. Pulmonary/Chest: Effort normal and breath sounds normal. No respiratory distress. He has no decreased breath sounds. He has no wheezes. He has no rhonchi. He has no rales.  Abdominal: Soft.  Normal appearance and bowel sounds are normal. He exhibits no distension. There is tenderness in the suprapubic area. There is no rigidity, no rebound, no guarding, no CVA tenderness, no tenderness at McBurney's point and negative Murphy's sign. A hernia is present. Hernia confirmed positive in the right inguinal area and confirmed positive in the left inguinal area.    Soft, obese but ND, +BS throughout, with mild suprapubic tenderness, no r/g/r, neg murphy's, neg mcburney's, no CVA TTP   Genitourinary: Testes normal. Rectal exam shows no external hemorrhoid, no internal hemorrhoid, no fissure, no mass, no tenderness and anal tone normal. Prostate is enlarged and tender. Cremasteric reflex is present. Right testis shows no mass, no swelling and no tenderness. Left testis shows no mass, no swelling and no tenderness. Circumcised. No phimosis, paraphimosis, hypospadias, penile erythema or penile tenderness. Discharge (noted at tip of penis) found.  Chaperone present No gross blood noted on rectal exam, normal tone, no tenderness, no mass or fissure, no hemorrhoids. Prostate warm, boggy, tender, and enlarged.  Circumcised penis without phimosis/paraphimosis, hypospadias, erythema, or tenderness. Mild discharge noted at tip of penis but does not get released during penile  palpation. Testes with no masses or tenderness, no swelling, and cremasterics reflex present bilaterally. Bilateral direct inguinal hernias. L sided indirect hernia noted which is nonTTP with easy reducibility.   Musculoskeletal: Normal range of motion.  MAE x4 Strength 5/5 in all extremities Sensation grossly intact in all extremities Distal pulses intact  Neurological: He is alert and oriented to person, place, and time. He has normal strength. No cranial nerve deficit or sensory deficit. Coordination and gait normal. GCS eye subscore is 4. GCS verbal subscore is 5. GCS motor subscore is 6.  CN 2-12 grossly intact A&O x4 GCS 15 Sensation and strength intact Gait nonataxic Coordination with finger-to-nose WNL  Skin: Skin is warm, dry and intact. No rash noted.  Psychiatric: He has a normal mood and affect.  Nursing note and vitals reviewed.   ED Course  Procedures (including critical care time) Labs Review Labs Reviewed  URINALYSIS, ROUTINE W REFLEX MICROSCOPIC - Abnormal; Notable for the following:    Color, Urine AMBER (*)    APPearance TURBID (*)    Bilirubin Urine SMALL (*)    Protein, ur 100 (*)    Leukocytes, UA LARGE (*)    All other components within normal limits  URINE MICROSCOPIC-ADD ON - Abnormal; Notable for the following:    Bacteria, UA MANY (*)    All other components within normal limits  CBC WITH DIFFERENTIAL/PLATELET - Abnormal; Notable for the following:    WBC 15.7 (*)    Platelets 132 (*)    Neutrophils Relative % 85 (*)    Neutro Abs 13.4 (*)    Lymphocytes Relative 6 (*)    Monocytes Absolute 1.4 (*)    All other components within normal limits  COMPREHENSIVE METABOLIC PANEL - Abnormal; Notable for the following:    Sodium 134 (*)    Glucose, Bld 127 (*)    GFR calc non Af Amer 68 (*)    GFR calc Af Amer 79 (*)    All other components within normal limits  URINE CULTURE  LIPASE, BLOOD    Imaging Review Ct Renal Stone Study  09/09/2014    CLINICAL DATA:  Right flank pain radiating to the right groin which began 2 days ago. Gross hematuria and dysuria which began today. Prior history of urinary tract calculi in the 1990s.  EXAM:  CT ABDOMEN AND PELVIS WITHOUT CONTRAST  TECHNIQUE: Multidetector CT imaging of the abdomen and pelvis was performed following the standard protocol without IV contrast.  COMPARISON:  12/28/2007.  FINDINGS: No evidence of urinary tract calculi or obstruction on either side. Within the limits of the unenhanced technique, no significant focal parenchymal abnormality involving either kidney. subcentimeter cortical cyst arising from the lower pole of the left kidney, unchanged.  Edema/inflammation surrounding the distal left ureter just above the UVJ in the mid pelvis, with the inflammation extending into the lower pelvis well removed from the left ureter, extending into the left inguinal canal (images 66 through 76). This is a new finding when compared to the prior examination. There is no evidence of colonic strangulation. A diverticulum is present within the portion of the colon in the hernia, though the mild edema/inflammation is on the opposite side of the portion of the colon within the hernia.  Scattered diverticula elsewhere in the colon without evidence of acute diverticulitis. Surgical clips in the right lower quadrant from prior appendectomy. Borderline to mild dilation of a several cm segment of the jejunum in the left upper quadrant, without convincing evidence of bowel obstruction currently. No free intraperitoneal air. No ascites. Prior Nissen fundoplication without evidence of recurrent hiatal hernia. Stomach normal in appearance.  Normal unenhanced appearance of the liver, spleen, pancreas, and adrenal glands. Gallbladder surgically absent. No biliary ductal dilation. Moderate aortoiliofemoral atherosclerosis without aneurysm. No significant lymphadenopathy.  Urinary bladder decompressed and unremarkable. Moderate  to marked prostate gland enlargement, the gland measuring approximately 5.5 x 6.6 x 5.4 cm with prior TURP defect. Asymmetric enlargement of the left seminal vesicle, not present on the prior CT. Engorgement of the lymphatic surrounding the prostate gland, also a new finding. No new or enlarging pelvic lymphadenopathy. Bilateral direct inguinal hernias containing fat, left larger than right.  Bone window images demonstrate multilevel degenerative disc disease, spondylosis and facet degenerative changes throughout the lower thoracic and lumbar spine. Visualized lung bases clear. Heart size normal.  IMPRESSION: 1. No findings are identified to explain right flank pain. 2. Edema/inflammation surrounding a short segment of the distal left ureter, though this inflammation extends into the left inguinal canal and is not associated with the ureter in the low pelvis at the UVJ. 3. Indirect left inguinal hernia containing a short segment of the colon at the junction of the descending and sigmoid colon. There is mild edema/inflammation associated with the hernia sac, though there is no wall thickening involving the colonic segment. This inflammation is contiguous with the findings in the left retroperitoneum discussed above. 4. Bilateral direct inguinal hernias containing fat, left greater than right 5. No evidence of urinary tract calculi or obstruction on either side. 6. Moderate to marked prostate gland enlargement, with interval increase in size since 2009. There is asymmetric enlargement of the left seminal vesicle which is a new finding. Engorgement of lymphatics surrounding the prostate gland is also a new finding. Please correlate with PSA in order to exclude prostate malignancy. 7. Scattered colonic diverticula without evidence of acute diverticulitis.   Electronically Signed   By: Evangeline Dakin M.D.   On: 09/09/2014 14:48     EKG Interpretation None      MDM   Final diagnoses:  Right flank pain    Prostatitis, acute  Leukocytosis  Urinary tract infection with hematuria, site unspecified  Fever, unspecified fever cause  Nonintractable episodic headache, unspecified headache type  Bilateral inguinal hernia without obstruction or gangrene, recurrence  not specified  Diverticulosis of large intestine without hemorrhage    64 y.o. male with subjective fevers (afebrile here but given tylenol 5hrs ago), dysuria, hematuria, and R flank pain. Hx of nephrolithiasis in 1990s, states this feels similar. Has a mild headache that started with the fever, nonfocal neuro exam, no red flag s/sx, states it's similar to prior headaches, doubt need for imaging or LP. Will get labs, U/A, Ucx, and give morphine and gentle hydration. Will monitor vitals for recurrence of fever. Will get CT renal. Will reassess shortly.   1:45 PM CBC with diff showing leukocytosis of 15.7, will proceed with rocephin since this could possibly indicate pyelonephritis. Afebrile at this time, will monitor. Lipase, CMP, and CT pending.  2:15 PM Pt has spiked a fever of 100.8. Will give tylenol now. HR 96, but given his leukocytosis and fever, pt now meeting SIRS/Sepsis criteria. Rocephin already given therefore no blood cultures need to be drawn. Lipase WNL. CMP WNL. Will await CT.  3:36 PM CT showing no R sided findings. Showing some mild edema around L distal ureter which extends into the inguinal canal. Indirect L inguinal hernia noted with short segment of colon. B/L indirect hernias. Moderately enlarged prostate. Scattered diverticula without diverticulitis. Given these findings, although pt doesn't have rectal/perineal pain, testicular pain/swelling, or penile discharge, proceeded with a rectal/GU exam which revealed warm boggy tender prostate consistent with prostatitis. Will recheck temp now, and give IV cipro now. Hernias easily reducible, doubt need for intervention at this time for these. Will give fluids now as well since pt  meeting sepsis criteria.   3:48 PM Fever has come down to 99.9 which is reassuring. Pain still under control, and headache resolved. Pt agreeable to IV abx once here and then PO cipro with urology f/up.   4:37 PM Continued improvement of vitals and fever. Getting Cipro at this time. Will let him finish this and then discharge home with Cipro and norco. Will have him f/up with Dr. Junious Silk. I explained the diagnosis and have given explicit precautions to return to the ER including for any other new or worsening symptoms. The patient understands and accepts the medical plan as it's been dictated and I have answered their questions. Discharge instructions concerning home care and prescriptions have been given. The patient is STABLE and is discharged to home in good condition.  BP 114/66 mmHg  Pulse 83  Temp(Src) 99.2 F (37.3 C) (Oral)  Resp 18  SpO2 95%  Meds ordered this encounter  Medications  . 0.9 %  sodium chloride infusion    Sig:   . morphine 4 MG/ML injection 4 mg    Sig:   . cefTRIAXone (ROCEPHIN) 1 g in dextrose 5 % 50 mL IVPB    Sig:   . acetaminophen (TYLENOL) tablet 650 mg    Sig:   . sodium chloride 0.9 % bolus 2,000 mL    Sig:   . ciprofloxacin (CIPRO) IVPB 400 mg    Sig:     Order Specific Question:  Antibiotic Indication:    Answer:  UTI    Order Specific Question:  Other Indication:    Answer:  prostatitis  . ciprofloxacin (CIPRO) 500 MG tablet    Sig: Take 1 tablet (500 mg total) by mouth 2 (two) times daily. One po bid x 28 days    Dispense:  56 tablet    Refill:  0    Order Specific Question:  Supervising Provider    Answer:  MILLER, BRIAN D [[id number]]  . HYDROcodone-acetaminophen (NORCO) 5-325 MG per tablet    Sig: Take 1 tablet by mouth every 6 (six) hours as needed for severe pain.    Dispense:  15 tablet    Refill:  0    Order Specific Question:  Supervising Provider    Answer:  Johnna Acosta [address] [place], PA-C 09/09/14  [id number]  Varney Biles, MD 09/11/14 1018

## 2014-09-10 DIAGNOSIS — E291 Testicular hypofunction: Secondary | ICD-10-CM | POA: Diagnosis not present

## 2014-09-10 DIAGNOSIS — N410 Acute prostatitis: Secondary | ICD-10-CM | POA: Diagnosis not present

## 2014-09-11 LAB — URINE CULTURE
Colony Count: >=100000
Special Requests: NORMAL

## 2014-09-12 ENCOUNTER — Telehealth (HOSPITAL_BASED_OUTPATIENT_CLINIC_OR_DEPARTMENT_OTHER): Payer: Self-pay | Admitting: Emergency Medicine

## 2014-09-12 NOTE — Telephone Encounter (Signed)
Post ED Visit - Positive Culture Follow-up  Culture report reviewed by antimicrobial stewardship pharmacist: []  [place], Pharm.D., BCPS [x]  Heide Guile, Pharm.D., BCPS []  Alycia Rossetti, Pharm.D., BCPS []  [place], [place].D., BCPS, AAHIVP []  Legrand Como, Pharm.D., BCPS, AAHIVP []  Isac Sarna, Pharm.D., BCPS  Positive urine culture Proteus Treated with Ciprofloxacin, organism sensitive to the same and no further patient follow-up is required at this time.  Hazle Nordmann 09/12/2014, 4:40 PM

## 2014-09-25 DIAGNOSIS — M961 Postlaminectomy syndrome, not elsewhere classified: Secondary | ICD-10-CM | POA: Diagnosis not present

## 2014-09-27 DIAGNOSIS — H4011X1 Primary open-angle glaucoma, mild stage: Secondary | ICD-10-CM | POA: Diagnosis not present

## 2014-11-06 ENCOUNTER — Ambulatory Visit (INDEPENDENT_AMBULATORY_CARE_PROVIDER_SITE_OTHER): Payer: Medicare Other | Admitting: Family Medicine

## 2014-11-06 ENCOUNTER — Encounter: Payer: Self-pay | Admitting: Family Medicine

## 2014-11-06 VITALS — BP 128/78 | HR 68 | Temp 97.8°F | Resp 16 | Ht 67.0 in | Wt 217.0 lb

## 2014-11-06 DIAGNOSIS — R1032 Left lower quadrant pain: Secondary | ICD-10-CM

## 2014-11-06 LAB — CBC W/MCH & 3 PART DIFF
HEMATOCRIT: 45.1 % (ref 39.0–52.0)
HEMOGLOBIN: 16.2 g/dL (ref 13.0–17.0)
LYMPHS ABS: 2 10*3/uL (ref 0.7–4.0)
Lymphocytes Relative: 28 % (ref 12–46)
MCH: 32.5 pg (ref 26.0–34.0)
MCHC: 35.9 g/dL (ref 30.0–36.0)
MCV: 90.6 fL (ref 78.0–100.0)
NEUTROS ABS: 4.8 10*3/uL (ref 1.7–7.7)
Neutrophils Relative %: 67 % (ref 43–77)
Platelets: 192 10*3/uL (ref 150–400)
RBC: 4.98 MIL/uL (ref 4.22–5.81)
RDW: 14.1 % (ref 11.5–15.5)
WBC mixed population: 0.4 10*3/uL (ref 0.1–1.8)
WBC: 7.2 10*3/uL (ref 4.0–10.5)
WBCMIXPER: 5 % (ref 3–18)

## 2014-11-06 MED ORDER — CIPROFLOXACIN HCL 500 MG PO TABS: 500.0000 mg | ORAL_TABLET | Freq: Two times a day (BID) | ORAL | Status: DC

## 2014-11-06 MED ORDER — METRONIDAZOLE 500 MG PO TABS: 500.0000 mg | ORAL_TABLET | Freq: Three times a day (TID) | ORAL | Status: DC

## 2014-11-06 NOTE — Progress Notes (Signed)
Subjective:    Patient ID: [name], male    DOB: Oct 16, 1950, 64 y.o.   MRN: [id number]  HPI In February, the patient was seen at the hospital for prostatitis. Patient took 28 days of antibiotics. Ever since that time he has had gradually increasing abdominal pain and bloating.Marland Kitchen His abdomen is distended today. He has hyperactive bowel sounds. He has moderate tenderness to palpation in the left lower quadrant. He denies any melena or hematochezia. He does report occasional diarrhea. He denies any constipation. He reports extremely foul-smelling stools and foul-smelling flatus. The crampy abdominal pain and left lower quadrant abdominal pain has steadily worsened over the last 2 days. He is afebrile here. Stat CBC shows white blood cell count of 7.  Patient denies dysuria or hematuria. He denies any right upper quadrant pain Past Medical History  Diagnosis Date  . Back pain, chronic     pt states he has 3 herniated disks--uses fentanyl patch for pain  . DVT (deep venous thrombosis) 2001 or 2002    right leg-required hospitalization x 8 days  . BPH (benign prostatic hyperplasia)     frequent urination, not able to hold urine well  . Arthritis     oa and ddd  . Colon polyps   . Blood transfusion without reported diagnosis 1950    rH negative  . Ulcer 1966  . Hyperlipidemia 2012  . DVT (deep venous thrombosis) 1996    right  . GERD (gastroesophageal reflux disease)   . Lumbago   . Glaucoma     both eyes  . H/O hiatal hernia   . History of IBS   . Diverticulosis   . Ulcerative colitis   . History of kidney stones     x 1  . Hemorrhoid   . Complication of anesthesia     pt had spinal for a knee replacement--"woke up" during surgery--and sick after surgery  . Wears glasses   . Wears hearing aid   . Sleep apnea     pt uses cpap - setting of 16  . 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 Right wrist fusion 12/12 - pt  wearing brace on the wrist-not started physical therapy yet    . Cervical fusion 2011--pt has good range of motion neck    . Bilateral carpal tunnel release 2009    . Back surgery  2008     bone spur removed   . Joint replacement      2001 left total knee and 1985 right total knee  . Nissen fundoplication [id number]    . Cholecystectomy  1994  . Appendectomy  1993  . Transurethral resection of prostate  10/09/2011    Procedure: TRANSURETHRAL RESECTION OF THE PROSTATE WITH GYRUS INSTRUMENTS;  Surgeon: Fredricka Bonine, MD;  Location: [facility];  Service: Urology;  Laterality: N/A;  . Prostate surgery  2014    TURP  . Spine surgery  2009    cervical spine fusion  . Mass removed from 2nd finger Left aug 2014    squamous carcinoma  . Uvvvp    . Total knee revision Right 11/17/2013    Procedure: RIGHT KNEE POLYETHYLENE REVISION, bone graft;  Surgeon: [name], MD;  Location: [facility];  Service: Orthopedics;  Laterality: Right;  . Mass excision Left 01/04/2014    Procedure: LEFT MIDDLE FINGER MASS EXCISION WITH NAILBED REPAIR AND ADVANCEMENT FLAP;  Surgeon: [name], MD;  Location: [facility];  Service: Orthopedics;  Laterality: Left;   Current Outpatient Prescriptions on File Prior to Visit  Medication Sig Dispense Refill  . acetaminophen (TYLENOL) 650 MG CR tablet Take 1,300 mg by mouth every 8 (eight) hours as needed for pain.    Marland Kitchen anastrozole (ARIMIDEX) 1 MG tablet Take 1 mg by mouth. 1 tab po mon & thurs    . aspirin 81 MG tablet Take 81 mg by mouth daily.    . bimatoprost (LUMIGAN) 0.03 % ophthalmic solution 1 drop at bedtime.    . Calcium Carbonate-Vitamin D (CALCIUM + D PO) Take 1 tablet by mouth daily.    . fentaNYL (DURAGESIC - DOSED MCG/HR) 25 MCG/HR Place 1 patch onto the skin every 3 (three) days. For chronic back pain    . fish oil-omega-3 fatty acids 1000 MG capsule Take 3 g by mouth daily.     . fluticasone (FLONASE) 50 MCG/ACT nasal spray Place 1 spray  into the nose every evening.     Marland Kitchen HYDROcodone-acetaminophen (NORCO) 5-325 MG per tablet Take 1 tablet by mouth every 6 (six) hours as needed for severe pain. 15 tablet 0  . Multiple Vitamin (MULTIVITAMIN WITH MINERALS) TABS tablet Take 1 tablet by mouth daily.    Marland Kitchen Propylene Glycol (SYSTANE BALANCE) 0.6 % SOLN Place 1 drop into both eyes 2 (two) times daily.    . sertraline (ZOLOFT) 50 MG tablet Take 50 mg by mouth daily after breakfast.    . traZODone (DESYREL) 50 MG tablet Take 1 tablet by mouth at bedtime as needed.      No current facility-administered medications on file prior to visit.   Allergies  Allergen Reactions  . Other     Pt allergic to some tapes--pt request that we only use paper tape!!   History   Social History  . Marital Status: Single    Spouse Name: N/A  . Number of Children: N/A  . Years of Education: N/A   Occupational History  . disabled    Social History Main Topics  . Smoking status: Former Smoker -- 1.00 packs/day for 19 years    Types: Cigarettes    Quit date: 08/04/1983  . Smokeless tobacco: Never Used     Comment: quit smoking 1985  . Alcohol Use: 3.6 oz/week    6 Cans of beer per week     Comment: maybe 2 or 3 beers per week  . Drug Use: No  . Sexual Activity: Not Currently   Other Topics Concern  . Not on file   Social History Narrative      Review of Systems  All other systems reviewed and are negative.      Objective:   Physical Exam  Constitutional: He appears well-developed and well-nourished.  Neck: Neck supple. No JVD present. No thyromegaly present.  Cardiovascular: Normal rate, regular rhythm and normal heart sounds.   No murmur heard. Pulmonary/Chest: Effort normal and breath sounds normal. No respiratory distress. He has no wheezes. He has no rales.  Abdominal: He exhibits distension. He exhibits no mass. There is tenderness. There is no rebound and no guarding.  Musculoskeletal: He exhibits no edema.    Lymphadenopathy:    He has no cervical adenopathy.  Vitals reviewed.         Assessment & Plan:  Abdominal pain, left lower quadrant - Plan: CBC w/MCH & 3 Part Diff, COMPLETE METABOLIC PANEL WITH GFR, Lipase, ciprofloxacin (CIPRO)  500 MG tablet, metroNIDAZOLE (FLAGYL) 500 MG tablet, Clostridium Difficile by PCR, CANCELED: Clostridium difficile EIA  Differential diagnosis includes C. Difficile colitis, diverticulitis, pancreatitis, bacterial imbalance due to antibiotics, IBS, ileus.  I am primarily concerned about diverticulitis. Begin Cipro 500 mg by mouth twice a day for 10 days. Also start Flagyl 500 mg by mouth 3 times a day. Obtain stool C. Difficile PCR test. Obtain CT scan of the abdomen and pelvis. I will also check a CMP and a lipase. Recheck in 48 hours. The hospital if worse. I will the patient also start florastor 250 bid.

## 2014-11-07 LAB — COMPLETE METABOLIC PANEL WITH GFR
ALK PHOS: 82 U/L (ref 39–117)
ALT: 20 U/L (ref 0–53)
AST: 22 U/L (ref 0–37)
Albumin: 4.9 g/dL (ref 3.5–5.2)
BILIRUBIN TOTAL: 0.5 mg/dL (ref 0.2–1.2)
BUN: 18 mg/dL (ref 6–23)
CO2: 27 mEq/L (ref 19–32)
Calcium: 9.7 mg/dL (ref 8.4–10.5)
Chloride: 100 mEq/L (ref 96–112)
Creat: 1.01 mg/dL (ref 0.50–1.35)
GFR, Est African American: >89 mL/min
GFR, Est Non African American: 78 mL/min
Glucose, Bld: 90 mg/dL (ref 70–99)
Potassium: 4.6 mEq/L (ref 3.5–5.3)
Sodium: 140 mEq/L (ref 135–145)
Total Protein: 7.9 g/dL (ref 6.0–8.3)

## 2014-11-07 LAB — LIPASE: LIPASE: 30 U/L (ref 0–75)

## 2014-11-08 ENCOUNTER — Encounter: Payer: Self-pay | Admitting: Family Medicine

## 2014-11-08 ENCOUNTER — Ambulatory Visit
Admission: RE | Admit: 2014-11-08 | Discharge: 2014-11-08 | Disposition: A | Discharge status: Home / Self Care | Payer: Medicare Other | Source: Ambulatory Visit | Attending: Family Medicine | Admitting: Family Medicine

## 2014-11-08 ENCOUNTER — Ambulatory Visit (INDEPENDENT_AMBULATORY_CARE_PROVIDER_SITE_OTHER): Payer: Medicare Other | Admitting: Family Medicine

## 2014-11-08 VITALS — BP 102/62 | HR 80 | Temp 97.6°F | Resp 16 | Ht 67.0 in | Wt 218.0 lb

## 2014-11-08 DIAGNOSIS — R110 Nausea: Secondary | ICD-10-CM

## 2014-11-08 DIAGNOSIS — Z9889 Other specified postprocedural states: Secondary | ICD-10-CM | POA: Diagnosis not present

## 2014-11-08 DIAGNOSIS — R1012 Left upper quadrant pain: Secondary | ICD-10-CM | POA: Diagnosis not present

## 2014-11-08 DIAGNOSIS — R140 Abdominal distension (gaseous): Secondary | ICD-10-CM | POA: Diagnosis not present

## 2014-11-08 LAB — CLOSTRIDIUM DIFFICILE BY PCR: Toxigenic C. Difficile by PCR: NOT DETECTED

## 2014-11-08 MED ORDER — ONDANSETRON HCL 4 MG PO TABS: 4.0000 mg | ORAL_TABLET | Freq: Three times a day (TID) | ORAL | Status: DC | PRN

## 2014-11-08 MED ORDER — MAGNESIUM CITRATE PO SOLN: 1.0000 | Freq: Once | ORAL | Status: DC

## 2014-11-08 NOTE — Progress Notes (Signed)
Subjective:    Patient ID: [name], male    DOB: 1951/03/07, 64 y.o.   MRN: [id number]  HPI 11/06/14 In February, the patient was seen at the hospital for prostatitis. Patient took 28 days of antibiotics. Ever since that time he has had gradually increasing abdominal pain and bloating.Marland Kitchen His abdomen is distended today. He has hyperactive bowel sounds. He has moderate tenderness to palpation in the left lower quadrant. He denies any melena or hematochezia. He does report occasional diarrhea. He denies any constipation. He reports extremely foul-smelling stools and foul-smelling flatus. The crampy abdominal pain and left lower quadrant abdominal pain has steadily worsened over the last 2 days. He is afebrile here. Stat CBC shows white blood cell count of 7.  Patient denies dysuria or hematuria. He denies any right upper quadrant pain.  AT that time, my plan was:  Differential diagnosis includes C. Difficile colitis, diverticulitis, pancreatitis, bacterial imbalance due to antibiotics, IBS, ileus.  I am primarily concerned about diverticulitis. Begin Cipro 500 mg by mouth twice a day for 10 days. Also start Flagyl 500 mg by mouth 3 times a day. Obtain stool C. Difficile PCR test. Obtain CT scan of the abdomen and pelvis. I will also check a CMP and a lipase. Recheck in 48 hours.  Go to The hospital if worse. I will the patient also start florastor 250 bid.  11/08/14 Patient is here today for recheck. He is doing no better. His nausea is worsening. He feels sick and bloated. His abdomen is distended. He has not had a bowel movement. He has to strain extremely hard to have a bowel movement. On examination today his bowel sounds are markedly diminished. He is only mildly tender to palpation left lower quadrant. He still feels  Distended.   Past Medical History  Diagnosis Date  . Back pain, chronic     pt states he has 3 herniated disks--uses fentanyl patch for pain  . DVT (deep venous thrombosis)  2001 or 2002    right leg-required hospitalization x 8 days  . BPH (benign prostatic hyperplasia)     frequent urination, not able to hold urine well  . Arthritis     oa and ddd  . Colon polyps   . Blood transfusion without reported diagnosis 1950    rH negative  . Ulcer 1966  . Hyperlipidemia 2012  . DVT (deep venous thrombosis) 1996    right  . GERD (gastroesophageal reflux disease)   . Lumbago   . Glaucoma     both eyes  . H/O hiatal hernia   . History of IBS   . Diverticulosis   . Ulcerative colitis   . History of kidney stones     x 1  . Hemorrhoid   . Complication of anesthesia     pt had spinal for a knee replacement--"woke up" during surgery--and sick after surgery  . Wears glasses   . Wears hearing aid   . Sleep apnea     pt uses cpap - setting of 16  . 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 Right wrist fusion 12/12 - pt wearing brace on the wrist-not started physical therapy yet    . Cervical fusion 2011--pt has good range of motion neck    . Bilateral carpal tunnel release 2009    . Back surgery  2008     bone spur removed   .  Joint replacement      2001 left total knee and 1985 right total knee  . Nissen fundoplication [id number]    . Cholecystectomy  1994  . Appendectomy  1993  . Transurethral resection of prostate  10/09/2011    Procedure: TRANSURETHRAL RESECTION OF THE PROSTATE WITH GYRUS INSTRUMENTS;  Surgeon: Fredricka Bonine, MD;  Location: [facility];  Service: Urology;  Laterality: N/A;  . Prostate surgery  2014    TURP  . Spine surgery  2009    cervical spine fusion  . Mass removed from 2nd finger Left aug 2014    squamous carcinoma  . Uvvvp    . Total knee revision Right 11/17/2013    Procedure: RIGHT KNEE POLYETHYLENE REVISION, bone graft;  Surgeon: [name], MD;  Location: [facility];  Service: Orthopedics;  Laterality: Right;  . Mass excision Left 01/04/2014    Procedure: LEFT  MIDDLE FINGER MASS EXCISION WITH NAILBED REPAIR AND ADVANCEMENT FLAP;  Surgeon: [name], MD;  Location: [facility];  Service: Orthopedics;  Laterality: Left;   Current Outpatient Prescriptions on File Prior to Visit  Medication Sig Dispense Refill  . acetaminophen (TYLENOL) 650 MG CR tablet Take 1,300 mg by mouth every 8 (eight) hours as needed for pain.    Marland Kitchen anastrozole (ARIMIDEX) 1 MG tablet Take 1 mg by mouth. 1 tab po mon & thurs    . aspirin 81 MG tablet Take 81 mg by mouth daily.    . bimatoprost (LUMIGAN) 0.03 % ophthalmic solution 1 drop at bedtime.    . Calcium Carbonate-Vitamin D (CALCIUM + D PO) Take 1 tablet by mouth daily.    . ciprofloxacin (CIPRO) 500 MG tablet Take 1 tablet (500 mg total) by mouth 2 (two) times daily. 20 tablet 0  . fentaNYL (DURAGESIC - DOSED MCG/HR) 25 MCG/HR Place 1 patch onto the skin every 3 (three) days. For chronic back pain    . fish oil-omega-3 fatty acids 1000 MG capsule Take 3 g by mouth daily.     . fluticasone (FLONASE) 50 MCG/ACT nasal spray Place 1 spray into the nose every evening.     Marland Kitchen HYDROcodone-acetaminophen (NORCO) 5-325 MG per tablet Take 1 tablet by mouth every 6 (six) hours as needed for severe pain. 15 tablet 0  . metroNIDAZOLE (FLAGYL) 500 MG tablet Take 1 tablet (500 mg total) by mouth 3 (three) times daily. 21 tablet 0  . Multiple Vitamin (MULTIVITAMIN WITH MINERALS) TABS tablet Take 1 tablet by mouth daily.    Marland Kitchen Propylene Glycol (SYSTANE BALANCE) 0.6 % SOLN Place 1 drop into both eyes 2 (two) times daily.    . sertraline (ZOLOFT) 50 MG tablet Take 50 mg by mouth daily after breakfast.    . traZODone (DESYREL) 50 MG tablet Take 1 tablet by mouth at bedtime as needed.      No current facility-administered medications on file prior to visit.   Allergies  Allergen Reactions  . Other     Pt allergic to some tapes--pt request that we only use paper tape!!   History   Social History  . Marital Status: Single      Spouse Name: N/A  . Number of Children: N/A  . Years of Education: N/A   Occupational History  . disabled    Social History Main Topics  . Smoking status: Former Smoker -- 1.00 packs/day for 19 years    Types: Cigarettes    Quit date: 08/04/1983  . Smokeless tobacco:  Never Used     Comment: quit smoking 1985  . Alcohol Use: 3.6 oz/week    6 Cans of beer per week     Comment: maybe 2 or 3 beers per week  . Drug Use: No  . Sexual Activity: Not Currently   Other Topics Concern  . Not on file   Social History Narrative      Review of Systems  All other systems reviewed and are negative.      Objective:   Physical Exam  Constitutional: He appears well-developed and well-nourished.  Neck: Neck supple. No JVD present. No thyromegaly present.  Cardiovascular: Normal rate, regular rhythm and normal heart sounds.   No murmur heard. Pulmonary/Chest: Effort normal and breath sounds normal. No respiratory distress. He has no wheezes. He has no rales.  Abdominal: He exhibits distension. He exhibits no mass. There is tenderness. There is no rebound and no guarding.  Musculoskeletal: He exhibits no edema.  Lymphadenopathy:    He has no cervical adenopathy.  Vitals reviewed.         Assessment & Plan:  Nausea without vomiting - Plan: DG Abd Acute W/Chest, ondansetron (ZOFRAN) 4 MG tablet, magnesium citrate SOLN  I'm concerned the patient may have some type of blockage. He is not scheduled to have the CT scan until Friday afternoon. I do not feel comfortable waiting for Friday afternoon. Therefore I will send the patient immediately to have abdominal x-rays performed. If there is a bowel obstruction, the patient will be sent directly to the emergency room. If there is no evidence of bowel obstruction and the patient is just simply nauseated and obstructed due to constipation, I will give the patient Zofran to take for nausea and magnesium citrate to take for constipation. I  instructed him not to take magnesium citrate or the Zofran until we have the results back from his abdominal x-ray. If abdominal x-ray is normal, he can start taking magnesium citrate and Zofran for nausea and constipation but I would still want to obtain the CT scans try to determine the cause of his pain

## 2014-11-09 ENCOUNTER — Ambulatory Visit
Admission: RE | Admit: 2014-11-09 | Discharge: 2014-11-09 | Disposition: A | Discharge status: Home / Self Care | Payer: Medicare Other | Source: Ambulatory Visit | Attending: Family Medicine | Admitting: Family Medicine

## 2014-11-09 DIAGNOSIS — Z9049 Acquired absence of other specified parts of digestive tract: Secondary | ICD-10-CM | POA: Diagnosis not present

## 2014-11-09 DIAGNOSIS — K4020 Bilateral inguinal hernia, without obstruction or gangrene, not specified as recurrent: Secondary | ICD-10-CM | POA: Diagnosis not present

## 2014-11-09 DIAGNOSIS — R1032 Left lower quadrant pain: Secondary | ICD-10-CM

## 2014-11-09 MED ORDER — IOPAMIDOL (ISOVUE-300) INJECTION 61%
125.0000 mL | Freq: Once | INTRAVENOUS | Status: AC | PRN
  Administered 2014-11-09: 125 mL via INTRAVENOUS

## 2014-12-03 DIAGNOSIS — R1013 Epigastric pain: Secondary | ICD-10-CM | POA: Diagnosis not present

## 2014-12-03 DIAGNOSIS — R140 Abdominal distension (gaseous): Secondary | ICD-10-CM | POA: Diagnosis not present

## 2014-12-03 DIAGNOSIS — R197 Diarrhea, unspecified: Secondary | ICD-10-CM | POA: Diagnosis not present

## 2014-12-06 ENCOUNTER — Ambulatory Visit: Payer: Medicare Other | Admitting: Pulmonary Disease

## 2014-12-10 DIAGNOSIS — R197 Diarrhea, unspecified: Secondary | ICD-10-CM | POA: Diagnosis not present

## 2014-12-19 ENCOUNTER — Encounter: Payer: Self-pay | Admitting: Pulmonary Disease

## 2014-12-19 ENCOUNTER — Ambulatory Visit (INDEPENDENT_AMBULATORY_CARE_PROVIDER_SITE_OTHER): Payer: Medicare Other | Admitting: Pulmonary Disease

## 2014-12-19 VITALS — BP 124/72 | HR 59 | Temp 97.6°F | Ht 67.0 in | Wt 221.2 lb

## 2014-12-19 DIAGNOSIS — G4733 Obstructive sleep apnea (adult) (pediatric): Secondary | ICD-10-CM | POA: Diagnosis not present

## 2014-12-19 NOTE — Progress Notes (Signed)
   Subjective:    Patient ID: [name], male    DOB: 1950/12/19, 64 y.o.   MRN: [id number]  HPI The patient comes in today for follow-up of his obstructive sleep apnea. He is wearing C Pap without difficulty, and is having no issues with his mask fit or pressure. He feels that he sleeps well, and is satisfied with his daytime alertness. His weight is stable from the last visit.   Review of Systems  Constitutional: Negative for fever and unexpected weight change.  HENT: Negative for congestion, dental problem, ear pain, nosebleeds, postnasal drip, rhinorrhea, sinus pressure, sneezing, sore throat and trouble swallowing.   Eyes: Negative for redness and itching.  Respiratory: Negative for cough, chest tightness, shortness of breath and wheezing.   Cardiovascular: Negative for palpitations and leg swelling.  Gastrointestinal: Negative for nausea and vomiting.  Genitourinary: Negative for dysuria.  Musculoskeletal: Negative for joint swelling.  Skin: Negative for rash.  Neurological: Negative for headaches.  Hematological: Does not bruise/bleed easily.  Psychiatric/Behavioral: Negative for dysphoric mood. The patient is not nervous/anxious.        Objective:   Physical Exam Overweight male in no acute distress Nose without purulence or discharge noted Neck without lymphadenopathy or thyromegaly No skin breakdown or pressure necrosis from the C Pap mask Lower extremities with minimal edema, no cyanosis Alert, does not appear to be sleepy, moves all 4 extremities.       Assessment & Plan:

## 2014-12-19 NOTE — Patient Instructions (Signed)
Continue on cpap, and keep up with mask changes and supplies. Keep working on weight loss followup with Dr. Halford Chessman in one year.

## 2014-12-19 NOTE — Assessment & Plan Note (Signed)
The patient continues to do well with his C Pap device, and is satisfied with his sleep and daytime alertness. I have asked him to keep up with his mask cushion changes and supplies, and to keep working on weight loss.

## 2014-12-25 DIAGNOSIS — Z79891 Long term (current) use of opiate analgesic: Secondary | ICD-10-CM | POA: Diagnosis not present

## 2014-12-25 DIAGNOSIS — M961 Postlaminectomy syndrome, not elsewhere classified: Secondary | ICD-10-CM | POA: Diagnosis not present

## 2014-12-25 DIAGNOSIS — G894 Chronic pain syndrome: Secondary | ICD-10-CM | POA: Diagnosis not present

## 2015-01-09 ENCOUNTER — Encounter: Payer: Self-pay | Admitting: Family Medicine

## 2015-01-09 ENCOUNTER — Ambulatory Visit (INDEPENDENT_AMBULATORY_CARE_PROVIDER_SITE_OTHER): Payer: Medicare Other | Admitting: Family Medicine

## 2015-01-09 VITALS — BP 138/72 | HR 84 | Temp 98.7°F | Resp 12 | Ht 67.0 in | Wt 215.0 lb

## 2015-01-09 DIAGNOSIS — T24291A Burn of second degree of multiple sites of right lower limb, except ankle and foot, initial encounter: Secondary | ICD-10-CM | POA: Diagnosis not present

## 2015-01-09 DIAGNOSIS — T300 Burn of unspecified body region, unspecified degree: Secondary | ICD-10-CM | POA: Diagnosis not present

## 2015-01-09 MED ORDER — CEPHALEXIN 500 MG PO CAPS: 500.0000 mg | ORAL_CAPSULE | Freq: Two times a day (BID) | ORAL | Status: DC

## 2015-01-09 MED ORDER — SILVER SULFADIAZINE 1 % EX CREA: 1.0000 "application " | TOPICAL_CREAM | Freq: Every day | CUTANEOUS | Status: DC

## 2015-01-09 NOTE — Patient Instructions (Signed)
Apply Silvadene twice a day  Take antibiotics Keep clean and covered F/U as needed
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
## 2015-01-31 DIAGNOSIS — H2513 Age-related nuclear cataract, bilateral: Secondary | ICD-10-CM | POA: Diagnosis not present

## 2015-01-31 DIAGNOSIS — H04123 Dry eye syndrome of bilateral lacrimal glands: Secondary | ICD-10-CM | POA: Diagnosis not present

## 2015-01-31 DIAGNOSIS — H4011X1 Primary open-angle glaucoma, mild stage: Secondary | ICD-10-CM | POA: Diagnosis not present

## 2015-03-07 DIAGNOSIS — H4011X1 Primary open-angle glaucoma, mild stage: Secondary | ICD-10-CM | POA: Diagnosis not present

## 2015-03-25 ENCOUNTER — Ambulatory Visit (INDEPENDENT_AMBULATORY_CARE_PROVIDER_SITE_OTHER): Payer: Medicare Other | Admitting: Family Medicine

## 2015-03-25 ENCOUNTER — Encounter: Payer: Self-pay | Admitting: Family Medicine

## 2015-03-25 VITALS — BP 100/66 | HR 56 | Temp 97.9°F | Resp 16 | Ht 67.0 in | Wt 212.0 lb

## 2015-03-25 DIAGNOSIS — Z13220 Encounter for screening for lipoid disorders: Secondary | ICD-10-CM | POA: Diagnosis not present

## 2015-03-25 DIAGNOSIS — E785 Hyperlipidemia, unspecified: Secondary | ICD-10-CM | POA: Diagnosis not present

## 2015-03-25 DIAGNOSIS — R1032 Left lower quadrant pain: Secondary | ICD-10-CM | POA: Diagnosis not present

## 2015-03-25 DIAGNOSIS — A6920 Lyme disease, unspecified: Secondary | ICD-10-CM

## 2015-03-25 DIAGNOSIS — R5382 Chronic fatigue, unspecified: Secondary | ICD-10-CM

## 2015-03-25 DIAGNOSIS — Z0000 Encounter for general adult medical examination without abnormal findings: Secondary | ICD-10-CM | POA: Diagnosis not present

## 2015-03-25 DIAGNOSIS — Z125 Encounter for screening for malignant neoplasm of prostate: Secondary | ICD-10-CM | POA: Diagnosis not present

## 2015-03-25 LAB — CBC WITH DIFFERENTIAL/PLATELET
Basophils Absolute: 0 10*3/uL (ref 0.0–0.1)
Basophils Relative: 1 % (ref 0–1)
EOS PCT: 4 % (ref 0–5)
Eosinophils Absolute: 0.2 10*3/uL (ref 0.0–0.7)
HEMATOCRIT: 45.1 % (ref 39.0–52.0)
Hemoglobin: 15.4 g/dL (ref 13.0–17.0)
LYMPHS ABS: 1.5 10*3/uL (ref 0.7–4.0)
Lymphocytes Relative: 36 % (ref 12–46)
MCH: 32 pg (ref 26.0–34.0)
MCHC: 34.1 g/dL (ref 30.0–36.0)
MCV: 93.8 fL (ref 78.0–100.0)
MONOS PCT: 10 % (ref 3–12)
MPV: 9.9 fL (ref 8.6–12.4)
Monocytes Absolute: 0.4 10*3/uL (ref 0.1–1.0)
Neutro Abs: 2.1 10*3/uL (ref 1.7–7.7)
Neutrophils Relative %: 49 % (ref 43–77)
Platelets: 177 10*3/uL (ref 150–400)
RBC: 4.81 MIL/uL (ref 4.22–5.81)
RDW: 13.9 % (ref 11.5–15.5)
WBC: 4.2 10*3/uL (ref 4.0–10.5)

## 2015-03-25 LAB — COMPLETE METABOLIC PANEL WITH GFR
ALT: 24 U/L (ref 9–46)
AST: 24 U/L (ref 10–35)
Albumin: 4.4 g/dL (ref 3.6–5.1)
Alkaline Phosphatase: 73 U/L (ref 40–115)
BILIRUBIN TOTAL: 0.6 mg/dL (ref 0.2–1.2)
BUN: 14 mg/dL (ref 7–25)
CALCIUM: 9.3 mg/dL (ref 8.6–10.3)
CO2: 29 mmol/L (ref 20–31)
Chloride: 103 mmol/L (ref 98–110)
Creat: 0.98 mg/dL (ref 0.70–1.25)
GFR, EST NON AFRICAN AMERICAN: 81 mL/min (ref >=60)
Glucose, Bld: 105 mg/dL — ABNORMAL HIGH (ref 70–99)
Potassium: 4.6 mmol/L (ref 3.5–5.3)
Sodium: 141 mmol/L (ref 135–146)
TOTAL PROTEIN: 7.1 g/dL (ref 6.1–8.1)

## 2015-03-25 LAB — LIPID PANEL
Cholesterol: 215 mg/dL — ABNORMAL HIGH (ref 125–200)
HDL: 57 mg/dL (ref >=40)
LDL CALC: 142 mg/dL — AB (ref <130)
TRIGLYCERIDES: 80 mg/dL (ref <150)
Total CHOL/HDL Ratio: 3.8 Ratio (ref <=5.0)
VLDL: 16 mg/dL (ref <30)

## 2015-03-25 MED ORDER — DOXYCYCLINE HYCLATE 100 MG PO TABS: 100.0000 mg | ORAL_TABLET | Freq: Two times a day (BID) | ORAL | Status: DC

## 2015-03-25 MED ORDER — DICYCLOMINE HCL 20 MG PO TABS: 20.0000 mg | ORAL_TABLET | Freq: Three times a day (TID) | ORAL | Status: DC

## 2015-03-25 NOTE — Progress Notes (Signed)
Subjective:    Patient ID: [name], male    DOB: 1951-07-15, 64 y.o.   MRN: [id number]  HPI Patient complains of persistent crampy abdominal pain that has been present ever since he was diagnosed with C. difficile colitis. He denies any diarrhea. He denies any constipation. He has 2 regular bowel movements every day. He denies any blood in his stool. He denies any nausea vomiting. He denies any fevers or chills. He is taking a probiotic. He also complains of muscle tension headaches and his occiput that radiates from his neck. He complains of pain and stiffness in his neck. He was also recently bitten by tick on his upper left back. It made a large red spreading [place] is approximately the diameter of a baseball. It is starting to regress now but it is concerning for erythema migrans. Last colonoscopy was 2014. He is due for prostate exam. Pneumovax, Prevnar, and the shingles vaccine are all up-to-date Past Medical History  Diagnosis Date  . Back pain, chronic     pt states he has 3 herniated disks--uses fentanyl patch for pain  . DVT (deep venous thrombosis) 2001 or 2002    right leg-required hospitalization x 8 days  . BPH (benign prostatic hyperplasia)     frequent urination, not able to hold urine well  . Arthritis     oa and ddd  . Colon polyps   . Blood transfusion without reported diagnosis 1950    rH negative  . Ulcer 1966  . Hyperlipidemia 2012  . DVT (deep venous thrombosis) 1996    right  . GERD (gastroesophageal reflux disease)   . Lumbago   . Glaucoma     both eyes  . H/O hiatal hernia   . History of IBS   . Diverticulosis   . Ulcerative colitis   . History of kidney stones     x 1  . Hemorrhoid   . Complication of anesthesia     pt had spinal for a knee replacement--"woke up" during surgery--and sick after surgery  . Wears glasses   . Wears hearing aid   . Sleep apnea     pt uses cpap - setting of 16  . 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 Right wrist fusion 12/12 - pt wearing brace on the wrist-not started physical therapy yet    . Cervical fusion 2011--pt has good range of motion neck    . Bilateral carpal tunnel release 2009    . Back surgery  2008     bone spur removed   . Joint replacement      2001 left total knee and 1985 right total knee  . Nissen fundoplication 6010    . Cholecystectomy  1994  . Appendectomy  1993  . Transurethral resection of prostate  10/09/2011    Procedure: TRANSURETHRAL RESECTION OF THE PROSTATE WITH GYRUS INSTRUMENTS;  Surgeon: Fredricka Bonine, MD;  Location: [facility];  Service: Urology;  Laterality: N/A;  . Prostate surgery  2014    TURP  . Spine surgery  2009    cervical spine fusion  . Mass removed from 2nd finger Left aug 2014    squamous carcinoma  . Uvvvp    . Total knee revision Right 11/17/2013    Procedure: RIGHT KNEE POLYETHYLENE REVISION, bone graft;  Surgeon: [name], MD;  Location: [facility];  Service: Orthopedics;  Laterality: Right;  . Mass excision Left 01/04/2014    Procedure: LEFT MIDDLE FINGER MASS EXCISION WITH NAILBED REPAIR AND ADVANCEMENT FLAP;  Surgeon: [name], MD;  Location: [place];  Service: Orthopedics;  Laterality: Left;   Current Outpatient Prescriptions on File Prior to Visit  Medication Sig Dispense Refill  . aspirin 81 MG tablet Take 81 mg by mouth daily.    . bimatoprost (LUMIGAN) 0.03 % ophthalmic solution 1 drop at bedtime.    . Calcium Carbonate-Vitamin D (CALCIUM + D PO) Take 1 tablet by mouth daily.    . fentaNYL (DURAGESIC - DOSED MCG/HR) 25 MCG/HR Place 1 patch onto the skin every 3 (three) days. For chronic back pain    . fish oil-omega-3 fatty acids 1000 MG capsule Take 3 g by mouth daily.     . fluticasone (FLONASE) 50 MCG/ACT nasal spray Place 1 spray into the nose every evening.     . magnesium citrate SOLN Take 296 mLs (1 Bottle total) by mouth once. 195  mL 1  . Multiple Vitamin (MULTIVITAMIN WITH MINERALS) TABS tablet Take 1 tablet by mouth daily.    Marland Kitchen Propylene Glycol (SYSTANE BALANCE) 0.6 % SOLN Place 1 drop into both eyes 2 (two) times daily.    . sertraline (ZOLOFT) 50 MG tablet Take 50 mg by mouth daily after breakfast.    . traZODone (DESYREL) 50 MG tablet Take 1 tablet by mouth at bedtime as needed.      No current facility-administered medications on file prior to visit.   Allergies  Allergen Reactions  . Other     Pt allergic to some tapes--pt request that we only use paper tape!!   Social History   Social History  . Marital Status: Single    Spouse Name: N/A  . Number of Children: N/A  . Years of Education: N/A   Occupational History  . disabled    Social History Main Topics  . Smoking status: Former Smoker -- 1.00 packs/day for 19 years    Types: Cigarettes    Quit date: 08/04/1983  . Smokeless tobacco: Never Used     Comment: quit smoking 1985  . Alcohol Use: 3.6 oz/week    6 Cans of beer per week     Comment: maybe 2 or 3 beers per week  . Drug Use: No  . Sexual Activity: Not Currently   Other Topics Concern  . Not on file   Social History Narrative      Review of Systems  All other systems reviewed and are negative.      Objective:   Physical Exam  Constitutional: He is oriented to person, place, and time. He appears well-developed and well-nourished. No distress.  HENT:  Head: Normocephalic and atraumatic.  Right Ear: External ear normal.  Left Ear: External ear normal.  Nose: Nose normal.  Mouth/Throat: Oropharynx is clear and moist. No oropharyngeal exudate.  Eyes: Conjunctivae and EOM are normal. Pupils are equal, round, and reactive to light. Right eye exhibits no discharge. Left eye exhibits no discharge. No scleral icterus.  Neck: Normal range of motion. Neck supple. No JVD present. No tracheal deviation present. No thyromegaly present.  Cardiovascular: Normal rate, regular rhythm,  normal heart sounds and intact distal pulses.  Exam reveals no gallop and no friction rub.   No murmur heard. Pulmonary/Chest: Effort normal and breath sounds normal. No respiratory distress. He has no wheezes. He has no rales. He exhibits no tenderness.  Abdominal: Soft.  Bowel sounds are normal. He exhibits no distension and no mass. There is no tenderness. There is no rebound and no guarding.  Genitourinary: Rectum normal and prostate normal.  Musculoskeletal: Normal range of motion. He exhibits no edema or tenderness.  Lymphadenopathy:    He has no cervical adenopathy.  Neurological: He is alert and oriented to person, place, and time. He has normal reflexes. He displays normal reflexes. No cranial nerve deficit. He exhibits normal muscle tone. Coordination normal.  Skin: Skin is warm. No rash noted. He is not diaphoretic. No erythema. No pallor.  Psychiatric: He has a normal mood and affect. His behavior is normal. Judgment and thought content normal.  Vitals reviewed.         Assessment & Plan:  Abdominal pain, left lower quadrant - Plan: dicyclomine (BENTYL) 20 MG tablet  Erythema migrans (Lyme disease) - Plan: doxycycline (VIBRA-TABS) 100 MG tablet  Routine general medical examination at a health care facility - Plan: CBC with Differential/Platelet, COMPLETE METABOLIC PANEL WITH GFR, Lipid panel, PSA, Medicare  Chronic fatigue - Plan: Testosterone  Screening cholesterol level - Plan: CBC with Differential/Platelet, COMPLETE METABOLIC PANEL WITH GFR, Lipid panel  Physical exam today is normal. Cancer screening is up-to-date. Immunizations are up-to-date. I will check a PSA in addition to a CBC CMP, and fasting lipid panel. I will treat the patient for erythema migrans with doxycycline 100 mg by mouth twice a day for 21 days. I will treat his crampy abdominal pain similar to irritable bowel syndrome with Bentyl 20 mg by mouth every 6 hours when necessary pain. Recheck in one month  if no better or sooner if worse

## 2015-03-26 LAB — PSA, MEDICARE: PSA: 5.73 ng/mL — ABNORMAL HIGH (ref <=4.00)

## 2015-03-26 LAB — TESTOSTERONE: Testosterone: 323 ng/dL (ref 300–890)

## 2015-03-27 ENCOUNTER — Encounter: Payer: Self-pay | Admitting: Family Medicine

## 2015-03-27 DIAGNOSIS — Z79891 Long term (current) use of opiate analgesic: Secondary | ICD-10-CM | POA: Diagnosis not present

## 2015-03-27 DIAGNOSIS — G894 Chronic pain syndrome: Secondary | ICD-10-CM | POA: Diagnosis not present

## 2015-03-27 DIAGNOSIS — M961 Postlaminectomy syndrome, not elsewhere classified: Secondary | ICD-10-CM | POA: Diagnosis not present

## 2015-04-09 DIAGNOSIS — R972 Elevated prostate specific antigen [PSA]: Secondary | ICD-10-CM | POA: Diagnosis not present

## 2015-04-09 DIAGNOSIS — N419 Inflammatory disease of prostate, unspecified: Secondary | ICD-10-CM | POA: Diagnosis not present

## 2015-04-30 ENCOUNTER — Ambulatory Visit (INDEPENDENT_AMBULATORY_CARE_PROVIDER_SITE_OTHER): Payer: Medicare Other | Admitting: Family Medicine

## 2015-04-30 DIAGNOSIS — Z23 Encounter for immunization: Secondary | ICD-10-CM

## 2015-05-08 ENCOUNTER — Telehealth: Payer: Self-pay | Admitting: Pulmonary Disease

## 2015-05-08 DIAGNOSIS — G4733 Obstructive sleep apnea (adult) (pediatric): Secondary | ICD-10-CM

## 2015-05-08 NOTE — Telephone Encounter (Signed)
lmtcb

## 2015-05-09 NOTE — Telephone Encounter (Signed)
Spoke with pt, states he needs cpap supplies.  Pt needs this sent to [facility] in [place].   VS are you ok with this order?  Thanks!

## 2015-05-10 NOTE — Telephone Encounter (Signed)
Okay to send order for new CPAP supplies.

## 2015-05-10 NOTE — Telephone Encounter (Signed)
Spoke with pt. He is aware that we will send in this order. Order has been placed. Nothing further was needed.

## 2015-05-20 ENCOUNTER — Telehealth: Payer: Self-pay | Admitting: Pulmonary Disease

## 2015-05-20 NOTE — Telephone Encounter (Signed)
Per 05/10/15 order: Note    Confirmation received from [facility] with [facility] that order for CPAP supplies has been received for [facility] in [place]. Rhonda J Cobb    ---  lmtcb x1

## 2015-05-21 NOTE — Telephone Encounter (Signed)
Patient returned call, may be reached at [phone].

## 2015-05-21 NOTE — Telephone Encounter (Signed)
lmtcb for pt.  

## 2015-05-21 NOTE — Telephone Encounter (Signed)
Spoke with Melissa at [facility] and she is going to check on the order and call us back.

## 2015-05-22 NOTE — Telephone Encounter (Signed)
Spoke with [name] at [facility], states that the order has been received and is being processed.    Spoke with pt to make aware.  Nothing further needed.

## 2015-05-22 NOTE — Telephone Encounter (Signed)
Spoke with Lenna Sciara  She has not heard back from respiratory She will check again and let us know

## 2015-06-06 DIAGNOSIS — H401131 Primary open-angle glaucoma, bilateral, mild stage: Secondary | ICD-10-CM | POA: Diagnosis not present

## 2015-06-13 DIAGNOSIS — Z125 Encounter for screening for malignant neoplasm of prostate: Secondary | ICD-10-CM | POA: Diagnosis not present

## 2015-06-19 DIAGNOSIS — M5126 Other intervertebral disc displacement, lumbar region: Secondary | ICD-10-CM | POA: Diagnosis not present

## 2015-06-19 DIAGNOSIS — Z79891 Long term (current) use of opiate analgesic: Secondary | ICD-10-CM | POA: Diagnosis not present

## 2015-06-19 DIAGNOSIS — M961 Postlaminectomy syndrome, not elsewhere classified: Secondary | ICD-10-CM | POA: Diagnosis not present

## 2015-06-19 DIAGNOSIS — G894 Chronic pain syndrome: Secondary | ICD-10-CM | POA: Diagnosis not present

## 2015-07-16 DIAGNOSIS — H04123 Dry eye syndrome of bilateral lacrimal glands: Secondary | ICD-10-CM | POA: Diagnosis not present

## 2015-07-18 ENCOUNTER — Ambulatory Visit (INDEPENDENT_AMBULATORY_CARE_PROVIDER_SITE_OTHER): Payer: Medicare Other | Admitting: Family Medicine

## 2015-07-18 ENCOUNTER — Encounter: Payer: Self-pay | Admitting: Family Medicine

## 2015-07-18 VITALS — BP 128/78 | HR 78 | Temp 97.7°F | Resp 16 | Ht 67.0 in | Wt 225.0 lb

## 2015-07-18 DIAGNOSIS — M542 Cervicalgia: Secondary | ICD-10-CM

## 2015-07-18 DIAGNOSIS — F32A Depression, unspecified: Secondary | ICD-10-CM

## 2015-07-18 DIAGNOSIS — R5382 Chronic fatigue, unspecified: Secondary | ICD-10-CM

## 2015-07-18 DIAGNOSIS — F329 Major depressive disorder, single episode, unspecified: Secondary | ICD-10-CM

## 2015-07-18 NOTE — Progress Notes (Signed)
Subjective:    Patient ID: [name], male    DOB: 02-18-1951, 64 y.o.   MRN: [id number]  HPI  Patient complains of daily pain. Pain is primarily located in his neck and in his left shoulder. He also has weakness with abduction and his left arm. Muscle strength is 4 over 5 with abduction of the left shoulder. However he also reports chronic fatigue, anhedonia, depression, poor sleeping, occasional insomnia, lack of desire. He denies any suicidal ideation. He denies any anxiety attacks. He has been on Zoloft for several years however he feels like the depression may be contributing some to his fatigue and his headaches. He denies any fevers or chills. He denies any weight loss. In August labs were obtained which were completely normal except for borderline low testosterone. He is followed by his urologist for his elevated PSA but thankfully this is been declining recently. Past Medical History  Diagnosis Date  . Back pain, chronic     pt states he has 3 herniated disks--uses fentanyl patch for pain  . DVT (deep venous thrombosis) ([place]) 2001 or 2002    right leg-required hospitalization x 8 days  . BPH (benign prostatic hyperplasia)     frequent urination, not able to hold urine well  . Arthritis     oa and ddd  . Colon polyps   . Blood transfusion without reported diagnosis 1950    rH negative  . Ulcer 1966  . Hyperlipidemia 2012  . DVT (deep venous thrombosis) ([place]) 1996    right  . GERD (gastroesophageal reflux disease)   . Lumbago   . Glaucoma     both eyes  . H/O hiatal hernia   . History of IBS   . Diverticulosis   . Ulcerative colitis ([place])   . History of kidney stones     x 1  . Hemorrhoid   . Complication of anesthesia     pt had spinal for a knee replacement--"woke up" during surgery--and sick after surgery  . Wears glasses   . Wears hearing aid   . Sleep apnea     pt uses cpap - setting of 16  . 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 Right wrist fusion 12/12 - pt wearing brace on the wrist-not started physical therapy yet    . Cervical fusion 2011--pt has good range of motion neck    . Bilateral carpal tunnel release 2009    . Back surgery  2008     bone spur removed   . Joint replacement      2001 left total knee and 1985 right total knee  . Nissen fundoplication 8469    . Cholecystectomy  1994  . Appendectomy  1993  . Transurethral resection of prostate  10/09/2011    Procedure: TRANSURETHRAL RESECTION OF THE PROSTATE WITH GYRUS INSTRUMENTS;  Surgeon: Fredricka Bonine, MD;  Location: [facility];  Service: Urology;  Laterality: N/A;  . Prostate surgery  2014    TURP  . Spine surgery  2009    cervical spine fusion  . Mass removed from 2nd finger Left aug 2014    squamous carcinoma  . Uvvvp    . Total knee revision Right 11/17/2013    Procedure: RIGHT KNEE POLYETHYLENE REVISION, bone graft;  Surgeon: [name], MD;  Location: [facility];  Service: Orthopedics;  Laterality: Right;  . Mass excision  Left 01/04/2014    Procedure: LEFT MIDDLE FINGER MASS EXCISION WITH NAILBED REPAIR AND ADVANCEMENT FLAP;  Surgeon: [name], MD;  Location: [place];  Service: Orthopedics;  Laterality: Left;   Current Outpatient Prescriptions on File Prior to Visit  Medication Sig Dispense Refill  . aspirin 81 MG tablet Take 81 mg by mouth daily.    . bimatoprost (LUMIGAN) 0.03 % ophthalmic solution 1 drop at bedtime.    . Calcium Carbonate-Vitamin D (CALCIUM + D PO) Take 1 tablet by mouth daily.    . fentaNYL (DURAGESIC - DOSED MCG/HR) 25 MCG/HR Place 1 patch onto the skin every 3 (three) days. For chronic back pain    . fish oil-omega-3 fatty acids 1000 MG capsule Take 3 g by mouth daily.     . fluticasone (FLONASE) 50 MCG/ACT nasal spray Place 1 spray into the nose every evening.     . magnesium citrate SOLN Take 296 mLs (1 Bottle total) by mouth once. 195 mL 1  .  Multiple Vitamin (MULTIVITAMIN WITH MINERALS) TABS tablet Take 1 tablet by mouth daily.    Marland Kitchen Propylene Glycol (SYSTANE BALANCE) 0.6 % SOLN Place 1 drop into both eyes 2 (two) times daily.    . sertraline (ZOLOFT) 50 MG tablet Take 50 mg by mouth daily after breakfast.    . traZODone (DESYREL) 50 MG tablet Take 1 tablet by mouth at bedtime as needed.      No current facility-administered medications on file prior to visit.   Allergies  Allergen Reactions  . Other     Pt allergic to some tapes--pt request that we only use paper tape!!   Social History   Social History  . Marital Status: Single    Spouse Name: N/A  . Number of Children: N/A  . Years of Education: N/A   Occupational History  . disabled    Social History Main Topics  . Smoking status: Former Smoker -- 1.00 packs/day for 19 years    Types: Cigarettes    Quit date: 08/04/1983  . Smokeless tobacco: Never Used     Comment: quit smoking 1985  . Alcohol Use: 3.6 oz/week    6 Cans of beer per week     Comment: maybe 2 or 3 beers per week  . Drug Use: No  . Sexual Activity: Not Currently   Other Topics Concern  . Not on file   Social History Narrative     Review of Systems  All other systems reviewed and are negative.      Objective:   Physical Exam  Cardiovascular: Normal rate, regular rhythm and normal heart sounds.   Pulmonary/Chest: Effort normal and breath sounds normal. No respiratory distress. He has no wheezes.  Musculoskeletal:       Left shoulder: He exhibits decreased strength. He exhibits normal range of motion, no tenderness, no bony tenderness and no pain.       Cervical back: He exhibits decreased range of motion and tenderness.  Vitals reviewed.         Assessment & Plan:  Depression  Chronic fatigue  Neck pain on left side  I believe the patient is dealing with depression. Decrease Zoloft to 50 mg by mouth daily for 1 week and then 50 mg by mouth every other day for 1 week and  then stop. Meanwhile start Trintellix 5 mg poqday for 2 weeks and then increase to 10 mg a day. Recheck in 5 weeks or immediately if worse.  We may need to consider  Imaging of the cervical spine or possibly a myelogram if pain and weakness persist.

## 2015-08-15 DIAGNOSIS — H16203 Unspecified keratoconjunctivitis, bilateral: Secondary | ICD-10-CM | POA: Diagnosis not present

## 2015-08-15 DIAGNOSIS — H16143 Punctate keratitis, bilateral: Secondary | ICD-10-CM | POA: Diagnosis not present

## 2015-08-22 ENCOUNTER — Encounter: Payer: Self-pay | Admitting: Family Medicine

## 2015-08-22 ENCOUNTER — Ambulatory Visit (INDEPENDENT_AMBULATORY_CARE_PROVIDER_SITE_OTHER): Payer: Medicare Other | Admitting: Family Medicine

## 2015-08-22 VITALS — BP 114/62 | HR 76 | Temp 97.7°F | Resp 20 | Ht 67.0 in | Wt 230.0 lb

## 2015-08-22 DIAGNOSIS — F329 Major depressive disorder, single episode, unspecified: Secondary | ICD-10-CM | POA: Diagnosis not present

## 2015-08-22 DIAGNOSIS — F32A Depression, unspecified: Secondary | ICD-10-CM

## 2015-08-22 NOTE — Progress Notes (Signed)
Subjective:    Patient ID: [name], male    DOB: 10/05/1950, 65 y.o.   MRN: [id number]  HPI  07/18/15 Patient complains of daily pain. Pain is primarily located in his neck and in his left shoulder. He also has weakness with abduction and his left arm. Muscle strength is 4 over 5 with abduction of the left shoulder. However he also reports chronic fatigue, anhedonia, depression, poor sleeping, occasional insomnia, lack of desire. He denies any suicidal ideation. He denies any anxiety attacks. He has been on Zoloft for several years however he feels like the depression may be contributing some to his fatigue and his headaches. He denies any fevers or chills. He denies any weight loss. In August labs were obtained which were completely normal except for borderline low testosterone. He is followed by his urologist for his elevated PSA but thankfully this is been declining recently.  At that time, my plan was: I believe the patient is dealing with depression. Decrease Zoloft to 50 mg by mouth daily for 1 week and then 50 mg by mouth every other day for 1 week and then stop. Meanwhile start Trintellix 5 mg poqday for 2 weeks and then increase to 10 mg a day. Recheck in 5 weeks or immediately if worse. We may need to consider  Imaging of the cervical spine or possibly a myelogram if pain and weakness persist.  08/22/15 Patient states he is doing markedly better on trintellix 10 mg poqdayt.  When asked to quantify, he states that he is at least 60-70% better. He is not as irritable. He is not as moody. His depression has improved. He has less fatigue and less anhedonia. His desire has improved. He definitely would like to continue the medication Past Medical History  Diagnosis Date  . Back pain, chronic     pt states he has 3 herniated disks--uses fentanyl patch for pain  . DVT (deep venous thrombosis) ([place]) 2001 or 2002    right leg-required hospitalization x 8 days  . BPH (benign prostatic  hyperplasia)     frequent urination, not able to hold urine well  . Arthritis     oa and ddd  . Colon polyps   . Blood transfusion without reported diagnosis 1950    rH negative  . Ulcer 1966  . Hyperlipidemia 2012  . DVT (deep venous thrombosis) ([place]) 1996    right  . GERD (gastroesophageal reflux disease)   . Lumbago   . Glaucoma     both eyes  . H/O hiatal hernia   . History of IBS   . Diverticulosis   . Ulcerative colitis ([place])   . History of kidney stones     x 1  . Hemorrhoid   . Complication of anesthesia     pt had spinal for a knee replacement--"woke up" during surgery--and sick after surgery  . Wears glasses   . Wears hearing aid   . Sleep apnea     pt uses cpap - setting of 16  . 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 Right wrist fusion 12/12 - pt wearing brace on the wrist-not started physical therapy yet    . Cervical fusion 2011--pt has good range of motion neck    . Bilateral carpal tunnel release 2009    . Back surgery  2008     bone spur removed   .  Joint replacement      2001 left total knee and 1985 right total knee  . Nissen fundoplication 0102    . Cholecystectomy  1994  . Appendectomy  1993  . Transurethral resection of prostate  10/09/2011    Procedure: TRANSURETHRAL RESECTION OF THE PROSTATE WITH GYRUS INSTRUMENTS;  Surgeon: Fredricka Bonine, MD;  Location: [facility];  Service: Urology;  Laterality: N/A;  . Prostate surgery  2014    TURP  . Spine surgery  2009    cervical spine fusion  . Mass removed from 2nd finger Left aug 2014    squamous carcinoma  . Uvvvp    . Total knee revision Right 11/17/2013    Procedure: RIGHT KNEE POLYETHYLENE REVISION, bone graft;  Surgeon: [name], MD;  [facility];  Service: Orthopedics;  Laterality: Right;  . Mass excision Left 01/04/2014    Procedure: LEFT MIDDLE FINGER MASS EXCISION WITH NAILBED REPAIR AND ADVANCEMENT FLAP;   Surgeon: [name], MD;  Location: [place];  Service: Orthopedics;  Laterality: Left;   Current Outpatient Prescriptions on File Prior to Visit  Medication Sig Dispense Refill  . aspirin 81 MG tablet Take 81 mg by mouth daily.    . bimatoprost (LUMIGAN) 0.03 % ophthalmic solution 1 drop at bedtime.    . Calcium Carbonate-Vitamin D (CALCIUM + D PO) Take 1 tablet by mouth daily.    . fentaNYL (DURAGESIC - DOSED MCG/HR) 25 MCG/HR Place 1 patch onto the skin every 3 (three) days. For chronic back pain    . fish oil-omega-3 fatty acids 1000 MG capsule Take 3 g by mouth daily.     . fluticasone (FLONASE) 50 MCG/ACT nasal spray Place 1 spray into the nose every evening.     . magnesium citrate SOLN Take 296 mLs (1 Bottle total) by mouth once. 195 mL 1  . Multiple Vitamin (MULTIVITAMIN WITH MINERALS) TABS tablet Take 1 tablet by mouth daily.    Marland Kitchen Propylene Glycol (SYSTANE BALANCE) 0.6 % SOLN Place 1 drop into both eyes 2 (two) times daily.    . sertraline (ZOLOFT) 50 MG tablet Take 50 mg by mouth daily after breakfast.    . traZODone (DESYREL) 50 MG tablet Take 1 tablet by mouth at bedtime as needed.      No current facility-administered medications on file prior to visit.   Allergies  Allergen Reactions  . Other     Pt allergic to some tapes--pt request that we only use paper tape!!   Social History   Social History  . Marital Status: Single    Spouse Name: N/A  . Number of Children: N/A  . Years of Education: N/A   Occupational History  . disabled    Social History Main Topics  . Smoking status: Former Smoker -- 1.00 packs/day for 19 years    Types: Cigarettes    Quit date: 08/04/1983  . Smokeless tobacco: Never Used     Comment: quit smoking 1985  . Alcohol Use: 3.6 oz/week    6 Cans of beer per week     Comment: maybe 2 or 3 beers per week  . Drug Use: No  . Sexual Activity: Not Currently   Other Topics Concern  . Not on file   Social History  Narrative     Review of Systems  All other systems reviewed and are negative.      Objective:   Physical Exam  Cardiovascular: Normal rate, regular rhythm and  normal heart sounds.   Pulmonary/Chest: Effort normal and breath sounds normal. No respiratory distress. He has no wheezes.  Vitals reviewed.         Assessment & Plan:  Depression  Continue Trintellix 10 g poqday.  Recheck in 6 months or immediately if worsening

## 2015-08-27 DIAGNOSIS — N400 Enlarged prostate without lower urinary tract symptoms: Secondary | ICD-10-CM | POA: Diagnosis not present

## 2015-08-27 DIAGNOSIS — E291 Testicular hypofunction: Secondary | ICD-10-CM | POA: Diagnosis not present

## 2015-08-27 DIAGNOSIS — R5383 Other fatigue: Secondary | ICD-10-CM | POA: Diagnosis not present

## 2015-08-27 DIAGNOSIS — Z0000 Encounter for general adult medical examination without abnormal findings: Secondary | ICD-10-CM | POA: Diagnosis not present

## 2015-08-29 DIAGNOSIS — H401131 Primary open-angle glaucoma, bilateral, mild stage: Secondary | ICD-10-CM | POA: Diagnosis not present

## 2015-08-29 DIAGNOSIS — H04123 Dry eye syndrome of bilateral lacrimal glands: Secondary | ICD-10-CM | POA: Diagnosis not present

## 2015-08-30 DIAGNOSIS — S0501XA Injury of conjunctiva and corneal abrasion without foreign body, right eye, initial encounter: Secondary | ICD-10-CM | POA: Diagnosis not present

## 2015-09-11 DIAGNOSIS — G894 Chronic pain syndrome: Secondary | ICD-10-CM | POA: Diagnosis not present

## 2015-09-11 DIAGNOSIS — Z79891 Long term (current) use of opiate analgesic: Secondary | ICD-10-CM | POA: Diagnosis not present

## 2015-09-11 DIAGNOSIS — M961 Postlaminectomy syndrome, not elsewhere classified: Secondary | ICD-10-CM | POA: Diagnosis not present

## 2015-09-11 DIAGNOSIS — M5126 Other intervertebral disc displacement, lumbar region: Secondary | ICD-10-CM | POA: Diagnosis not present

## 2015-09-30 DIAGNOSIS — H401131 Primary open-angle glaucoma, bilateral, mild stage: Secondary | ICD-10-CM | POA: Diagnosis not present

## 2015-10-28 ENCOUNTER — Encounter: Payer: Self-pay | Admitting: Family Medicine

## 2015-10-28 ENCOUNTER — Ambulatory Visit (INDEPENDENT_AMBULATORY_CARE_PROVIDER_SITE_OTHER): Payer: Medicare Other | Admitting: Family Medicine

## 2015-10-28 VITALS — BP 120/72 | HR 74 | Temp 98.2°F | Resp 20 | Ht 67.0 in | Wt 228.0 lb

## 2015-10-28 DIAGNOSIS — J3489 Other specified disorders of nose and nasal sinuses: Secondary | ICD-10-CM

## 2015-10-28 DIAGNOSIS — H401131 Primary open-angle glaucoma, bilateral, mild stage: Secondary | ICD-10-CM | POA: Diagnosis not present

## 2015-10-28 DIAGNOSIS — K921 Melena: Secondary | ICD-10-CM | POA: Diagnosis not present

## 2015-10-28 DIAGNOSIS — J208 Acute bronchitis due to other specified organisms: Secondary | ICD-10-CM

## 2015-10-28 LAB — CBC W/MCH & 3 PART DIFF
HCT: 41.8 % (ref 39.0–52.0)
HEMOGLOBIN: 15 g/dL (ref 13.0–17.0)
LYMPHS PCT: 38 % (ref 12–46)
Lymphs Abs: 2.1 10*3/uL (ref 0.7–4.0)
MCH: 32.3 pg (ref 26.0–34.0)
MCHC: 35.9 g/dL (ref 30.0–36.0)
MCV: 89.9 fL (ref 78.0–100.0)
NEUTROS PCT: 54 % (ref 43–77)
Neutro Abs: 2.9 10*3/uL (ref 1.7–7.7)
PLATELETS: 190 10*3/uL (ref 150–400)
RBC: 4.65 MIL/uL (ref 4.22–5.81)
RDW: 13.3 % (ref 11.5–15.5)
WBC mixed population %: 8 % (ref 3–18)
WBC mixed population: 0.4 10*3/uL (ref 0.1–1.8)
WBC: 5.4 10*3/uL (ref 4.0–10.5)

## 2015-10-28 MED ORDER — MUPIROCIN CALCIUM 2 % EX CREA: 1.0000 "application " | TOPICAL_CREAM | Freq: Two times a day (BID) | CUTANEOUS | Status: DC

## 2015-10-28 MED ORDER — AZITHROMYCIN 250 MG PO TABS: ORAL_TABLET | ORAL | Status: DC

## 2015-10-28 NOTE — Progress Notes (Signed)
Subjective:    Patient ID: [name], male    DOB: 1951-06-30, 65 y.o.   MRN: [id number]  HPI  Please see my office visit from January. The patient is doing much better on Trintellix 10 mg poqday.  He states that his depression has greatly improved and he would like a refill on this medication. Unfortunately he has been sick off and on for one month. One month ago he developed flulike symptoms including high fever and diffuse body aches. Symptoms lasted approximately 10 days and then he gradually improved. Proximally 1 week ago, he developed a persistent cough, with occasional wheezing, chest congestion, and subjective fevers. He acquired this infection while caring for his mother who has been diagnosed with the flu and subsequently pneumonia. His fevers have subsided and his cough now is nonproductive. Symptoms are lingering on for approximately 1 week but they are not worsening. 2 days ago, he developed copious bright red blood in his stool. He reports no pain with defecation. However when he looked in the toilet bowl, he had filled it with blood. Patient states that he developed the sudden urge to defecate again and again he experienced copious bright red blood per rectum. This happened for 2 days straight. It is gradually improved today. On examination he does have a small external hemorrhoid which is not bleeding and does not appear to be the source of the bleeding. On rectal exam there are no palpable rectal masses or internal palpable hemorrhoids. There is no obvious explanation for the blood in his stool. I suspect diverticulosis versus AVM versus a large internal hemorrhoid. Past Medical History  Diagnosis Date  . Back pain, chronic     pt states he has 3 herniated disks--uses fentanyl patch for pain  . DVT (deep venous thrombosis) ([place]) 2001 or 2002    right leg-required hospitalization x 8 days  . BPH (benign prostatic hyperplasia)     frequent urination, not able to hold urine well    . Arthritis     oa and ddd  . Colon polyps   . Blood transfusion without reported diagnosis 1950    rH negative  . Ulcer 1966  . Hyperlipidemia 2012  . DVT (deep venous thrombosis) ([place]) 1996    right  . GERD (gastroesophageal reflux disease)   . Lumbago   . Glaucoma     both eyes  . H/O hiatal hernia   . History of IBS   . Diverticulosis   . Ulcerative colitis ([place])   . History of kidney stones     x 1  . Hemorrhoid   . Complication of anesthesia     pt had spinal for a knee replacement--"woke up" during surgery--and sick after surgery  . Wears glasses   . Wears hearing aid   . Sleep apnea     pt uses cpap - setting of 16  . 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 Right wrist fusion 12/12 - pt wearing brace on the wrist-not started physical therapy yet    . Cervical fusion 2011--pt has good range of motion neck    . Bilateral carpal tunnel release 2009    . Back surgery  2008     bone spur removed   . Joint replacement      2001 left total knee and 1985 right total knee  . Nissen fundoplication [id number]    .  Cholecystectomy  1994  . Appendectomy  1993  . Transurethral resection of prostate  10/09/2011    Procedure: TRANSURETHRAL RESECTION OF THE PROSTATE WITH GYRUS INSTRUMENTS;  Surgeon: Fredricka Bonine, MD;  Location: [facility];  Service: Urology;  Laterality: N/A;  . Prostate surgery  2014    TURP  . Spine surgery  2009    cervical spine fusion  . Mass removed from 2nd finger Left aug 2014    squamous carcinoma  . Uvvvp    . Total knee revision Right 11/17/2013    Procedure: RIGHT KNEE POLYETHYLENE REVISION, bone graft;  Surgeon: [name], MD;  Location: [facility];  Service: Orthopedics;  Laterality: Right;  . Mass excision Left 01/04/2014    Procedure: LEFT MIDDLE FINGER MASS EXCISION WITH NAILBED REPAIR AND ADVANCEMENT FLAP;  Surgeon: [name], MD;  Location: [place];   Service: Orthopedics;  Laterality: Left;   Current Outpatient Prescriptions on File Prior to Visit  Medication Sig Dispense Refill  . aspirin 81 MG tablet Take 81 mg by mouth daily.    . bimatoprost (LUMIGAN) 0.03 % ophthalmic solution 1 drop at bedtime.    . Calcium Carbonate-Vitamin D (CALCIUM + D PO) Take 1 tablet by mouth daily.    . fentaNYL (DURAGESIC - DOSED MCG/HR) 25 MCG/HR Place 1 patch onto the skin every 3 (three) days. For chronic back pain    . fish oil-omega-3 fatty acids 1000 MG capsule Take 3 g by mouth daily.     . fluticasone (FLONASE) 50 MCG/ACT nasal spray Place 1 spray into the nose every evening.     . magnesium citrate SOLN Take 296 mLs (1 Bottle total) by mouth once. 195 mL 1  . Multiple Vitamin (MULTIVITAMIN WITH MINERALS) TABS tablet Take 1 tablet by mouth daily.    Marland Kitchen Propylene Glycol (SYSTANE BALANCE) 0.6 % SOLN Place 1 drop into both eyes 2 (two) times daily.    . traZODone (DESYREL) 50 MG tablet Take 1 tablet by mouth at bedtime as needed.     . Vortioxetine HBr (TRINTELLIX) 10 MG TABS Take by mouth.     No current facility-administered medications on file prior to visit.   Allergies  Allergen Reactions  . Other     Pt allergic to some tapes--pt request that we only use paper tape!!   Social History   Social History  . Marital Status: Single    Spouse Name: N/A  . Number of Children: N/A  . Years of Education: N/A   Occupational History  . disabled    Social History Main Topics  . Smoking status: Former Smoker -- 1.00 packs/day for 19 years    Types: Cigarettes    Quit date: 08/04/1983  . Smokeless tobacco: Never Used     Comment: quit smoking 1985  . Alcohol Use: 3.6 oz/week    6 Cans of beer per week     Comment: maybe 2 or 3 beers per week  . Drug Use: No  . Sexual Activity: Not Currently   Other Topics Concern  . Not on file   Social History Narrative    Review of Systems  All other systems reviewed and are negative.        Objective:   Physical Exam  Cardiovascular: Normal rate, regular rhythm and normal heart sounds.   Pulmonary/Chest: Effort normal. No respiratory distress. He has wheezes. He has no rales.  Abdominal: Soft. Bowel sounds are normal. He exhibits no distension.  There is no tenderness. There is no rebound and no guarding.  Genitourinary: Rectal exam shows external hemorrhoid. Rectal exam shows no internal hemorrhoid, no fissure, no mass, no tenderness and anal tone normal. Prostate is not enlarged.  Vitals reviewed.         Assessment & Plan:  Nasal sore - Plan: mupirocin cream (BACTROBAN) 2 %  Acute bronchitis due to other specified organisms - Plan: azithromycin (ZITHROMAX) 250 MG tablet  Hematochezia - Plan: CBC w/MCH & 3 Part Diff, Ambulatory referral to Gastroenterology  The patient has a sore in his left nostril that he says is been there for the last 2 weeks and will not heal. It is located on the floor of the left nostril. I will have him apply Bactroban twice daily for 1 week and notify me if the sore is not improving. I do believe he has viral bronchitis. I recommended tincture of time. Should he develop a fever or purulent sputum, I did give him prescription for a Z-Pak in case symptoms worsen. However I suspect his symptoms will gradually improve over the next week. I believe his hematochezia is likely due to diverticulosis. I have asked the patient to temporarily discontinue aspirin. I will consult his gastroenterologist. I checked a CBC today and his hemoglobin is stable at 15.

## 2015-10-29 ENCOUNTER — Encounter: Payer: Self-pay | Admitting: Gastroenterology

## 2015-12-04 DIAGNOSIS — M961 Postlaminectomy syndrome, not elsewhere classified: Secondary | ICD-10-CM | POA: Diagnosis not present

## 2015-12-04 DIAGNOSIS — Z79891 Long term (current) use of opiate analgesic: Secondary | ICD-10-CM | POA: Diagnosis not present

## 2015-12-04 DIAGNOSIS — G894 Chronic pain syndrome: Secondary | ICD-10-CM | POA: Diagnosis not present

## 2015-12-04 DIAGNOSIS — M5126 Other intervertebral disc displacement, lumbar region: Secondary | ICD-10-CM | POA: Diagnosis not present

## 2015-12-31 ENCOUNTER — Telehealth: Payer: Self-pay | Admitting: Family Medicine

## 2015-12-31 NOTE — Telephone Encounter (Signed)
Pt is requesting samples of Trintellix 10 mg. [phone]

## 2016-01-01 ENCOUNTER — Other Ambulatory Visit (INDEPENDENT_AMBULATORY_CARE_PROVIDER_SITE_OTHER): Payer: Medicare Other

## 2016-01-01 ENCOUNTER — Encounter: Payer: Self-pay | Admitting: Gastroenterology

## 2016-01-01 ENCOUNTER — Ambulatory Visit (INDEPENDENT_AMBULATORY_CARE_PROVIDER_SITE_OTHER): Payer: Medicare Other | Admitting: Gastroenterology

## 2016-01-01 VITALS — BP 132/82 | HR 68 | Ht 67.0 in | Wt 231.6 lb

## 2016-01-01 DIAGNOSIS — K625 Hemorrhage of anus and rectum: Secondary | ICD-10-CM

## 2016-01-01 DIAGNOSIS — R195 Other fecal abnormalities: Secondary | ICD-10-CM | POA: Diagnosis not present

## 2016-01-01 LAB — CBC WITH DIFFERENTIAL/PLATELET
Basophils Absolute: 0 10*3/uL (ref 0.0–0.1)
Basophils Relative: 0.7 % (ref 0.0–3.0)
Eosinophils Absolute: 0.2 10*3/uL (ref 0.0–0.7)
Eosinophils Relative: 3.6 % (ref 0.0–5.0)
HEMATOCRIT: 44.7 % (ref 39.0–52.0)
HEMOGLOBIN: 15.4 g/dL (ref 13.0–17.0)
LYMPHS PCT: 23.9 % (ref 12.0–46.0)
Lymphs Abs: 1.4 10*3/uL (ref 0.7–4.0)
MCHC: 34.4 g/dL (ref 30.0–36.0)
MCV: 91.7 fl (ref 78.0–100.0)
MONO ABS: 0.8 10*3/uL (ref 0.1–1.0)
Monocytes Relative: 14.5 % — ABNORMAL HIGH (ref 3.0–12.0)
Neutro Abs: 3.3 10*3/uL (ref 1.4–7.7)
Neutrophils Relative %: 57.3 % (ref 43.0–77.0)
Platelets: 202 10*3/uL (ref 150.0–400.0)
RBC: 4.87 Mil/uL (ref 4.22–5.81)
RDW: 13.9 % (ref 11.5–15.5)
WBC: 5.8 10*3/uL (ref 4.0–10.5)

## 2016-01-01 LAB — BASIC METABOLIC PANEL
BUN: 15 mg/dL (ref 6–23)
CHLORIDE: 105 meq/L (ref 96–112)
CO2: 28 mEq/L (ref 19–32)
Calcium: 9.6 mg/dL (ref 8.4–10.5)
Creatinine, Ser: 0.99 mg/dL (ref 0.40–1.50)
GFR: 80.2 mL/min (ref >60.00)
Glucose, Bld: 87 mg/dL (ref 70–99)
Potassium: 4.6 mEq/L (ref 3.5–5.1)
SODIUM: 138 meq/L (ref 135–145)

## 2016-01-01 MED ORDER — VORTIOXETINE HBR 10 MG PO TABS: 10.0000 mg | ORAL_TABLET | Freq: Every day | ORAL | Status: DC

## 2016-01-01 MED ORDER — NA SULFATE-K SULFATE-MG SULF 17.5-3.13-1.6 GM/177ML PO SOLN: 1.0000 | Freq: Once | ORAL | Status: DC

## 2016-01-01 NOTE — Telephone Encounter (Signed)
Samples up front for pick up, pt aware

## 2016-01-01 NOTE — Patient Instructions (Addendum)
You will have labs checked today in the basement lab.  Please head down after you check out with the front desk  (cbc, bmet, stool for C. Diff by toxin, PCr, ova and parasite, routine stool culture).  You will be set up for a colonoscopy for loose stools, bleeding, history of polyps.

## 2016-01-01 NOTE — Progress Notes (Signed)
HPI: This is a   very pleasant 65 year old man     who was referred to me by [name], MD  to evaluate  chronic loose stools, intermittently bloody .    Chief complaint is chronic loose stools, intermittently bloody, family history of ulcerative colitis  Bleeding, bright red for 2 months ago.  He tells me he had C. Diff about years ago.  Went to see Dr. Teena Irani, was recommended to take very expensive antibiotics which he didn't take.  He took other antibiotics.  Has cramping.  2-3 loose stools daily.  Never nocturnal.  Has blood in the stool about 1-2 times per week.  Has been gaining weight 20 pounds in 2 months.  Colonoscopy Dr. Ardis Hughs 2008 for "history of adenomatous polyps". This was normal and he was recommended to have repeat colonoscopy at five-year interval. A letter was sent in 2013 as a reminder, a letter was sent in 2014 as a reminder.  His father had ulcerative colitis and needed colectomy at some point. He did not have colon cancer.  Review of systems: Pertinent positive and negative review of systems were noted in the above HPI section. Complete review of systems was performed and was otherwise normal.   Past Medical History  Diagnosis Date  . Back pain, chronic     pt states he has 3 herniated disks--uses fentanyl patch for pain  . DVT (deep venous thrombosis) ([place]) 2001 or 2002    right leg-required hospitalization x 8 days  . BPH (benign prostatic hyperplasia)     frequent urination, not able to hold urine well  . Arthritis     oa and ddd  . Colon polyps   . Blood transfusion without reported diagnosis 1950    rH negative  . Ulcer 1966  . Hyperlipidemia 2012  . DVT (deep venous thrombosis) ([place]) 1996    right  . GERD (gastroesophageal reflux disease)   . Lumbago   . Glaucoma     both eyes  . H/O hiatal hernia   . History of IBS   . Diverticulosis   . Ulcerative colitis ([place])   . History of kidney stones     x 1  . Hemorrhoid   .  Complication of anesthesia     pt had spinal for a knee replacement--"woke up" during surgery--and sick after surgery  . Wears glasses   . Wears hearing aid   . Sleep apnea     pt uses cpap - setting of 16  . 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 Right wrist fusion 12/12 - pt wearing brace on the wrist-not started physical therapy yet    . Cervical fusion 2011--pt has good range of motion neck    . Bilateral carpal tunnel release 2009    . Back surgery  2008     bone spur removed   . Joint replacement      2001 left total knee and 1985 right total knee  . Nissen fundoplication [id number]    . Cholecystectomy  1994  . Appendectomy  1993  . Transurethral resection of prostate  10/09/2011    Procedure: TRANSURETHRAL RESECTION OF THE PROSTATE WITH GYRUS INSTRUMENTS;  Surgeon: Fredricka Bonine, MD;  [facility];  Service: Urology;  Laterality: N/A;  . Prostate surgery  2014    TURP  . Spine surgery  2009  cervical spine fusion  . Mass removed from 2nd finger Left aug 2014    squamous carcinoma  . Uvvvp    . Total knee revision Right 11/17/2013    Procedure: RIGHT KNEE POLYETHYLENE REVISION, bone graft;  Surgeon: [name], MD;  Location[facility];  Service: Orthopedics;  Laterality: Right;  . Mass excision Left 01/04/2014    Procedure: LEFT MIDDLE FINGER MASS EXCISION WITH NAILBED REPAIR AND ADVANCEMENT FLAP;  Surgeon: [name], MD;  Location: [place];  Service: Orthopedics;  Laterality: Left;    Current Outpatient Prescriptions  Medication Sig Dispense Refill  . aspirin 81 MG tablet Take 81 mg by mouth daily.    . Calcium Carbonate-Vitamin D (CALCIUM + D PO) Take 1 tablet by mouth daily.    . fentaNYL (DURAGESIC - DOSED MCG/HR) 25 MCG/HR Place 1 patch onto the skin every 3 (three) days. For chronic back pain    . fish oil-omega-3 fatty acids 1000 MG capsule Take 3 g by mouth daily.      . fluticasone (FLONASE) 50 MCG/ACT nasal spray Place 1 spray into the nose every evening.     . magnesium citrate SOLN Take 296 mLs (1 Bottle total) by mouth once. 195 mL 1  . Multiple Vitamin (MULTIVITAMIN WITH MINERALS) TABS tablet Take 1 tablet by mouth daily.    . mupirocin cream (BACTROBAN) 2 % Apply 1 application topically 2 (two) times daily. 15 g 0  . traZODone (DESYREL) 50 MG tablet Take 1 tablet by mouth at bedtime as needed.     . Vortioxetine HBr (TRINTELLIX) 10 MG TABS Take by mouth.     No current facility-administered medications for this visit.    Allergies as of 01/01/2016 - Review Complete 01/01/2016  Allergen Reaction Noted  . Other  10/06/2011    Family History  Problem Relation Age of Onset  . Colon cancer Father   . Bone cancer Father     deceased  . Cancer Father   . Diabetes Father   . Arthritis Mother   . Hearing loss Mother   . Hyperlipidemia Mother   . Hypertension Mother   . Deep vein thrombosis Mother   . Diabetes Sister   . Hearing loss Maternal Grandmother   . Diabetes Paternal Grandfather     Social History   Social History  . Marital Status: Single    Spouse Name: N/A  . Number of Children: N/A  . Years of Education: N/A   Occupational History  . disabled    Social History Main Topics  . Smoking status: Former Smoker -- 1.00 packs/day for 19 years    Types: Cigarettes    Quit date: 08/04/1983  . Smokeless tobacco: Never Used     Comment: quit smoking 1985  . Alcohol Use: 3.6 oz/week    6 Cans of beer per week     Comment: maybe 2 or 3 beers per week  . Drug Use: No  . Sexual Activity: Not Currently   Other Topics Concern  . Not on file   Social History Narrative     Physical Exam: BP 132/82 mmHg  Pulse 68  Ht 5' 7"  (1.702 m)  Wt 231 lb 9.6 oz (105.053 kg)  BMI 36.27 kg/m2 Constitutional: generally well-appearing Psychiatric: alert and oriented x3 Eyes: extraocular movements intact Mouth: oral pharynx moist,  no lesions Neck: supple no lymphadenopathy Cardiovascular: heart regular rate and rhythm Lungs: clear to auscultation bilaterally Abdomen: soft, nontender, nondistended, no  obvious ascites, no peritoneal signs, normal bowel sounds Extremities: no lower extremity edema bilaterally Skin: no lesions on visible extremities   Assessment and plan: 65 y.o. male with  Chronic loose stools intermittently bloody, family history of ulcerative colitis. He tells me he had C. difficile 2 years ago in took some antibiotics but maybe not all the antibiotics it was recommended. He is seen Dr. Teena Irani from [facility] around that time. He is changing back to see me now. Perhaps the C. difficile was never completely treated. He has a family history of ulcerative colitis so maybe he is developing that. He has personal history of adenomatous polyps and never answered the recall letters which we sent out in 2013, 2014. I doubt he has underlying neoplasm however. I recommended we proceed with blood tests, stool testing. All that is summarized in his instructions. We will also proceed with colonoscopy at his soonest convenience.    Owens Loffler, MD [facility] 01/01/2016, 9:17 AM  Cc: [name], MD

## 2016-01-02 ENCOUNTER — Other Ambulatory Visit: Payer: Medicare Other

## 2016-01-02 ENCOUNTER — Other Ambulatory Visit: Payer: Self-pay | Admitting: Gastroenterology

## 2016-01-02 DIAGNOSIS — R195 Other fecal abnormalities: Secondary | ICD-10-CM | POA: Diagnosis not present

## 2016-01-02 DIAGNOSIS — K625 Hemorrhage of anus and rectum: Secondary | ICD-10-CM

## 2016-01-03 LAB — CLOSTRIDIUM DIFFICILE BY PCR: CDIFFPCR: NOT DETECTED

## 2016-01-03 LAB — OVA AND PARASITE EXAMINATION: OP: NONE SEEN

## 2016-01-06 LAB — STOOL CULTURE

## 2016-01-08 ENCOUNTER — Ambulatory Visit: Payer: Medicare Other | Admitting: Pulmonary Disease

## 2016-01-08 ENCOUNTER — Ambulatory Visit (AMBULATORY_SURGERY_CENTER): Payer: Medicare Other | Admitting: Gastroenterology

## 2016-01-08 ENCOUNTER — Encounter: Payer: Self-pay | Admitting: Gastroenterology

## 2016-01-08 VITALS — BP 129/81 | HR 66 | Temp 98.0°F | Resp 18 | Ht 67.0 in | Wt 231.0 lb

## 2016-01-08 DIAGNOSIS — Z86010 Personal history of colonic polyps: Secondary | ICD-10-CM | POA: Diagnosis not present

## 2016-01-08 DIAGNOSIS — K625 Hemorrhage of anus and rectum: Secondary | ICD-10-CM

## 2016-01-08 DIAGNOSIS — R197 Diarrhea, unspecified: Secondary | ICD-10-CM | POA: Diagnosis not present

## 2016-01-08 DIAGNOSIS — R195 Other fecal abnormalities: Secondary | ICD-10-CM

## 2016-01-08 DIAGNOSIS — F329 Major depressive disorder, single episode, unspecified: Secondary | ICD-10-CM | POA: Diagnosis not present

## 2016-01-08 DIAGNOSIS — G4733 Obstructive sleep apnea (adult) (pediatric): Secondary | ICD-10-CM | POA: Diagnosis not present

## 2016-01-08 MED ORDER — SODIUM CHLORIDE 0.9 % IV SOLN: 500.0000 mL | INTRAVENOUS | Status: DC

## 2016-01-08 NOTE — Op Note (Signed)
[place] Patient Name: [name] Procedure Date: 01/08/2016 9:36 AM MRN: [id number] Endoscopist: Milus Banister , MD Age: 65 Referring MD:  Date of Birth: 1950-09-26 Gender: Male Procedure:                Colonoscopy Indications:              Chronic diarrhea, intermittently bloody, also h/o                            adenomatous polyp Medicines:                Monitored Anesthesia Care Procedure:                Pre-Anesthesia Assessment:                           - Prior to the procedure, a History and Physical                            was performed, and patient medications and                            allergies were reviewed. The patient's tolerance of                            previous anesthesia was also reviewed. The risks                            and benefits of the procedure and the sedation                            options and risks were discussed with the patient.                            All questions were answered, and informed consent                            was obtained. Prior Anticoagulants: The patient has                            taken no previous anticoagulant or antiplatelet                            agents. ASA Grade Assessment: II - A patient with                            mild systemic disease. After reviewing the risks                            and benefits, the patient was deemed in                            satisfactory condition to undergo the procedure.  After obtaining informed consent, the colonoscope                            was passed under direct vision. Throughout the                            procedure, the patient's blood pressure, pulse, and                            oxygen saturations were monitored continuously. The                            Model CF-HQ190L [id number]) scope was introduced                            through the anus and advanced to the the terminal             ileum. The colonoscopy was performed without                            difficulty. The patient tolerated the procedure                            well. The quality of the bowel preparation was                            excellent. The terminal ileum, ileocecal valve,                            appendiceal orifice, and rectum were photographed. Scope In: 9:43:18 AM Scope Out: 9:55:40 AM Scope Withdrawal Time: 0 hours 10 minutes 5 seconds  Total Procedure Duration: 0 hours 12 minutes 22 seconds  Findings:                 The terminal ileum appeared normal.                           The colon (entire examined portion) appeared normal                            except the very distal rectum appeared very                            slightly inflammed (5-6cm with subtle transition to                            more normal appearing mucosa). Biopsies were taken                            from right colon, left colon and rectum (separate                            jars).  The exam was otherwise without abnormality on                            direct and retroflexion views. Complications:            No immediate complications. Estimated blood loss:                            None. Estimated Blood Loss:     Estimated blood loss: none. Impression:               - The examined portion of the ileum was normal.                           - The colon (entire examined portion) appeared                            normal except the very distal rectum appeared very                            slightly inflammed (5-6cm with subtle transition to                            more normal appearing mucosa). Biopsies were taken                            from right colon, left colon and rectum (separate                            jars).                           - The examination was otherwise normal on direct                            and retroflexion views. Recommendation:            - Patient has a contact number available for                            emergencies. The signs and symptoms of potential                            delayed complications were discussed with the                            patient. Return to normal activities tomorrow.                            Written discharge instructions were provided to the                            patient.                           - Resume previous diet.                           -  Continue present medications.                           - Repeat colonoscopy is recommended. The                            colonoscopy date will be determined after pathology                            results from today's exam become available for                            review. Milus Banister, MD 01/08/2016 10:02:38 AM This report has been signed electronically.

## 2016-01-08 NOTE — Patient Instructions (Signed)
YOU HAD AN ENDOSCOPIC PROCEDURE TODAY AT [facility]:   Refer to the procedure report that was given to you for any specific questions about what was found during the examination.  If the procedure report does not answer your questions, please call your gastroenterologist to clarify.  If you requested that your care partner not be given the details of your procedure findings, then the procedure report has been included in a sealed envelope for you to review at your convenience later.  YOU SHOULD EXPECT: Some feelings of bloating in the abdomen. Passage of more gas than usual.  Walking can help get rid of the air that was put into your GI tract during the procedure and reduce the bloating. If you had a lower endoscopy (such as a colonoscopy or flexible sigmoidoscopy) you may notice spotting of blood in your stool or on the toilet paper. If you underwent a bowel prep for your procedure, you may not have a normal bowel movement for a few days.  Please Note:  You might notice some irritation and congestion in your nose or some drainage.  This is from the oxygen used during your procedure.  There is no need for concern and it should clear up in a day or so.  SYMPTOMS TO REPORT IMMEDIATELY:   Following lower endoscopy (colonoscopy or flexible sigmoidoscopy):  Excessive amounts of blood in the stool  Significant tenderness or worsening of abdominal pains  Swelling of the abdomen that is new, acute  Fever of 100F or higher  For urgent or emergent issues, a gastroenterologist can be reached at any hour by calling [phone].   DIET: Your first meal following the procedure should be a small meal and then it is ok to progress to your normal diet. Heavy or fried foods are harder to digest and may make you feel nauseous or bloated.  Likewise, meals heavy in dairy and vegetables can increase bloating.  Drink plenty of fluids but you should avoid alcoholic beverages for 24  hours.  ACTIVITY:  You should plan to take it easy for the rest of today and you should NOT DRIVE or use heavy machinery until tomorrow (because of the sedation medicines used during the test).    FOLLOW UP: Our staff will call the number listed on your records the next business day following your procedure to check on you and address any questions or concerns that you may have regarding the information given to you following your procedure. If we do not reach you, we will leave a message.  However, if you are feeling well and you are not experiencing any problems, there is no need to return our call.  We will assume that you have returned to your regular daily activities without incident.  If any biopsies were taken you will be contacted by phone or by letter within the next 1-3 weeks.  Please call us at [phone] if you have not heard about the biopsies in 3 weeks.    SIGNATURES/CONFIDENTIALITY: You and/or your care partner have signed paperwork which will be entered into your electronic medical record.  These signatures attest to the fact that that the information above on your After Visit Summary has been reviewed and is understood.  Full responsibility of the confidentiality of this discharge information lies with you and/or your care-partner.  Biopsy results pending.

## 2016-01-08 NOTE — Progress Notes (Signed)
Called to room to assist during endoscopic procedure.  Patient ID and intended procedure confirmed with present staff. Received instructions for my participation in the procedure from the performing physician.  

## 2016-01-08 NOTE — Progress Notes (Signed)
Report to PACU, RN, vss, BBS= Clear.  

## 2016-01-09 ENCOUNTER — Telehealth: Payer: Self-pay | Admitting: *Deleted

## 2016-01-09 NOTE — Telephone Encounter (Signed)
  Follow up Call-  Call back number 01/08/2016  Post procedure Call Back phone  # [phone]  Permission to leave phone message Yes     Patient questions:  Do you have a fever, pain , or abdominal swelling? No. Pain Score  0 *  Have you tolerated food without any problems? Yes.    Have you been able to return to your normal activities? Yes.    Do you have any questions about your discharge instructions: Diet   No. Medications  No. Follow up visit  No.  Do you have questions or concerns about your Care? No.  Actions: * If pain score is 4 or above: No action needed, pain <4.

## 2016-01-10 ENCOUNTER — Ambulatory Visit (INDEPENDENT_AMBULATORY_CARE_PROVIDER_SITE_OTHER): Payer: Medicare Other | Admitting: Pulmonary Disease

## 2016-01-10 ENCOUNTER — Encounter: Payer: Self-pay | Admitting: Pulmonary Disease

## 2016-01-10 VITALS — BP 132/80 | HR 75 | Ht 67.0 in | Wt 226.2 lb

## 2016-01-10 DIAGNOSIS — G4733 Obstructive sleep apnea (adult) (pediatric): Secondary | ICD-10-CM

## 2016-01-10 DIAGNOSIS — Z9989 Dependence on other enabling machines and devices: Principal | ICD-10-CM

## 2016-01-10 NOTE — Patient Instructions (Signed)
Will arrange for new CPAP machine and call with results of CPAP download  Follow up in 1 year

## 2016-01-10 NOTE — Progress Notes (Signed)
Current Outpatient Prescriptions on File Prior to Visit  Medication Sig  . aspirin 81 MG tablet Take 81 mg by mouth daily.  . Calcium Carbonate-Vitamin D (CALCIUM + D PO) Take 1 tablet by mouth daily.  . fentaNYL (DURAGESIC - DOSED MCG/HR) 25 MCG/HR Place 1 patch onto the skin every 3 (three) days. For chronic back pain  . fish oil-omega-3 fatty acids 1000 MG capsule Take 3 g by mouth daily.   . fluticasone (FLONASE) 50 MCG/ACT nasal spray Place 1 spray into the nose every evening.   . magnesium citrate SOLN Take 296 mLs (1 Bottle total) by mouth once.  . Multiple Vitamin (MULTIVITAMIN WITH MINERALS) TABS tablet Take 1 tablet by mouth daily.  . traZODone (DESYREL) 50 MG tablet Take 1 tablet by mouth at bedtime as needed.   . vortioxetine HBr (TRINTELLIX) 10 MG TABS Take 1 tablet (10 mg total) by mouth daily.  . mupirocin cream (BACTROBAN) 2 % Apply 1 application topically 2 (two) times daily. (Patient not taking: Reported on 01/10/2016)   No current facility-administered medications on file prior to visit.     Chief Complaint  Patient presents with  . Follow-up    Former [place] patient : Wears CPAP nighlty. Denies problems with mask/pressure. Wants a new machine. DME: [facility]     Tests PSG 12/21/91 >> AHI 56  Past medical hx DVT 1996/2001/2002, BPH, Back pain, HLD, GERD, HH, IBS, Ulcerative colitis, Nephrolithiasis, Glaucoma    Past surgical hx, Allergies, Family hx, Social hx all reviewed.  Vital Signs BP 132/80 mmHg  Pulse 75  Ht 5' 7"  (1.702 m)  Wt 226 lb 3.2 oz (102.604 kg)  BMI 35.42 kg/m2  SpO2 95%  History of Present Illness [name] is a 64 y.o. male with obstructive sleep apnea.  His machine is ancient.  It isn't working like it used to, and comes apart easily.  He doesn't have issues with mask fit.  He goes to bed at 10 pm and wakes up at 6 am.  He sleeps through the night.  He doesn't nap during the day.  He feels rested during the day.  He has been on fentanyl patch  for years.  Physical Exam  General - No distress ENT - No sinus tenderness, no oral exudate, no LAN, MP 3 Cardiac - s1s2 regular, no murmur Chest - No wheeze/rales/dullness Back - No focal tenderness Abd - Soft, non-tender Ext - No edema Neuro - Normal strength Skin - No rashes Psych - normal mood, and behavior   Assessment/Plan  Obstructive sleep apnea. - will arrange for new auto CPAP range 5 to 20 cm H2O - will get copy of download from new machine and call him with results - explained he might need repeat sleep study for insurance coverage of new machine >> he should be able to do home sleep study   Patient Instructions  Will arrange for new CPAP machine and call with results of CPAP download  Follow up in 1 year     Chesley Mires, MD [facility]/Sleep Pager:  [phone] 01/10/2016, 3:25 PM

## 2016-01-22 ENCOUNTER — Telehealth: Payer: Self-pay | Admitting: Family Medicine

## 2016-01-22 NOTE — Telephone Encounter (Signed)
Pt is requesting more samples of TrinTrintellix

## 2016-01-24 NOTE — Telephone Encounter (Signed)
Pt came to office today and samples were given - had to contact rep to get additional samples.

## 2016-01-27 DIAGNOSIS — H04123 Dry eye syndrome of bilateral lacrimal glands: Secondary | ICD-10-CM | POA: Diagnosis not present

## 2016-01-27 DIAGNOSIS — H401131 Primary open-angle glaucoma, bilateral, mild stage: Secondary | ICD-10-CM | POA: Diagnosis not present

## 2016-02-10 DIAGNOSIS — H04122 Dry eye syndrome of left lacrimal gland: Secondary | ICD-10-CM | POA: Diagnosis not present

## 2016-02-13 ENCOUNTER — Telehealth: Payer: Self-pay | Admitting: Family Medicine

## 2016-02-13 NOTE — Telephone Encounter (Signed)
Pt is requesting samples of Trintellix 10 mg [phone]

## 2016-02-17 NOTE — Telephone Encounter (Signed)
Samples left up front and pt aware

## 2016-03-05 DIAGNOSIS — G894 Chronic pain syndrome: Secondary | ICD-10-CM | POA: Diagnosis not present

## 2016-03-05 DIAGNOSIS — M5126 Other intervertebral disc displacement, lumbar region: Secondary | ICD-10-CM | POA: Diagnosis not present

## 2016-03-05 DIAGNOSIS — M961 Postlaminectomy syndrome, not elsewhere classified: Secondary | ICD-10-CM | POA: Diagnosis not present

## 2016-03-05 DIAGNOSIS — Z79891 Long term (current) use of opiate analgesic: Secondary | ICD-10-CM | POA: Diagnosis not present

## 2016-03-12 DIAGNOSIS — H04123 Dry eye syndrome of bilateral lacrimal glands: Secondary | ICD-10-CM | POA: Diagnosis not present

## 2016-03-12 DIAGNOSIS — H401131 Primary open-angle glaucoma, bilateral, mild stage: Secondary | ICD-10-CM | POA: Diagnosis not present

## 2016-03-24 DIAGNOSIS — M25512 Pain in left shoulder: Secondary | ICD-10-CM | POA: Diagnosis not present

## 2016-03-24 DIAGNOSIS — M25511 Pain in right shoulder: Secondary | ICD-10-CM | POA: Diagnosis not present

## 2016-03-27 ENCOUNTER — Other Ambulatory Visit: Payer: Medicare Other

## 2016-03-27 ENCOUNTER — Encounter: Payer: Self-pay | Admitting: Pulmonary Disease

## 2016-03-27 ENCOUNTER — Ambulatory Visit (INDEPENDENT_AMBULATORY_CARE_PROVIDER_SITE_OTHER): Payer: Medicare Other | Admitting: Pulmonary Disease

## 2016-03-27 VITALS — BP 128/72 | HR 64 | Ht 67.0 in | Wt 227.0 lb

## 2016-03-27 DIAGNOSIS — R972 Elevated prostate specific antigen [PSA]: Secondary | ICD-10-CM | POA: Diagnosis not present

## 2016-03-27 DIAGNOSIS — J310 Chronic rhinitis: Secondary | ICD-10-CM | POA: Diagnosis not present

## 2016-03-27 DIAGNOSIS — Z0000 Encounter for general adult medical examination without abnormal findings: Secondary | ICD-10-CM | POA: Diagnosis not present

## 2016-03-27 DIAGNOSIS — F32A Depression, unspecified: Secondary | ICD-10-CM

## 2016-03-27 DIAGNOSIS — G4733 Obstructive sleep apnea (adult) (pediatric): Secondary | ICD-10-CM

## 2016-03-27 DIAGNOSIS — F329 Major depressive disorder, single episode, unspecified: Secondary | ICD-10-CM

## 2016-03-27 DIAGNOSIS — Z9989 Dependence on other enabling machines and devices: Principal | ICD-10-CM

## 2016-03-27 DIAGNOSIS — Z79899 Other long term (current) drug therapy: Secondary | ICD-10-CM | POA: Diagnosis not present

## 2016-03-27 LAB — COMPLETE METABOLIC PANEL WITH GFR
ALBUMIN: 4.3 g/dL (ref 3.6–5.1)
ALT: 33 U/L (ref 9–46)
AST: 22 U/L (ref 10–35)
Alkaline Phosphatase: 54 U/L (ref 40–115)
BUN: 18 mg/dL (ref 7–25)
CALCIUM: 9.4 mg/dL (ref 8.6–10.3)
CHLORIDE: 102 mmol/L (ref 98–110)
CO2: 25 mmol/L (ref 20–31)
Creat: 0.98 mg/dL (ref 0.70–1.25)
GFR, Est African American: >89 mL/min (ref >=60)
GFR, Est Non African American: 80 mL/min (ref >=60)
GLUCOSE: 103 mg/dL — AB (ref 70–99)
POTASSIUM: 4.3 mmol/L (ref 3.5–5.3)
SODIUM: 140 mmol/L (ref 135–146)
Total Bilirubin: 0.6 mg/dL (ref 0.2–1.2)
Total Protein: 6.9 g/dL (ref 6.1–8.1)

## 2016-03-27 LAB — LIPID PANEL
CHOL/HDL RATIO: 3.7 ratio (ref <=5.0)
Cholesterol: 231 mg/dL — ABNORMAL HIGH (ref 125–200)
HDL: 63 mg/dL (ref >=40)
LDL CALC: 150 mg/dL — AB (ref <130)
Triglycerides: 92 mg/dL (ref <150)
VLDL: 18 mg/dL (ref <30)

## 2016-03-27 LAB — PSA: PSA: 1.1 ng/mL (ref <=4.0)

## 2016-03-27 LAB — CBC WITH DIFFERENTIAL/PLATELET
BASOS PCT: 0 %
Basophils Absolute: 0 cells/uL (ref 0–200)
EOS PCT: 0 %
Eosinophils Absolute: 0 cells/uL — ABNORMAL LOW (ref 15–500)
HEMATOCRIT: 44.2 % (ref 38.5–50.0)
Hemoglobin: 15.4 g/dL (ref 13.0–17.0)
LYMPHS PCT: 16 %
Lymphs Abs: 1264 cells/uL (ref 850–3900)
MCH: 32.4 pg (ref 27.0–33.0)
MCHC: 34.8 g/dL (ref 32.0–36.0)
MCV: 93.1 fL (ref 80.0–100.0)
MONOS PCT: 10 %
MPV: 9.9 fL (ref 7.5–12.5)
Monocytes Absolute: 790 cells/uL (ref 200–950)
NEUTROS PCT: 74 %
Neutro Abs: 5846 cells/uL (ref 1500–7800)
PLATELETS: 180 10*3/uL (ref 140–400)
RBC: 4.75 MIL/uL (ref 4.20–5.80)
RDW: 13.5 % (ref 11.0–15.0)
WBC: 7.9 10*3/uL (ref 3.8–10.8)

## 2016-03-27 LAB — TSH: TSH: 3.2 m[IU]/L (ref 0.40–4.50)

## 2016-03-27 NOTE — Progress Notes (Signed)
Current Outpatient Prescriptions on File Prior to Visit  Medication Sig  . aspirin 81 MG tablet Take 81 mg by mouth daily.  . Calcium Carbonate-Vitamin D (CALCIUM + D PO) Take 1 tablet by mouth daily.  . fentaNYL (DURAGESIC - DOSED MCG/HR) 25 MCG/HR Place 1 patch onto the skin every 3 (three) days. For chronic back pain  . fish oil-omega-3 fatty acids 1000 MG capsule Take 3 g by mouth daily.   . fluticasone (FLONASE) 50 MCG/ACT nasal spray Place 1 spray into the nose every evening.   . Multiple Vitamin (MULTIVITAMIN WITH MINERALS) TABS tablet Take 1 tablet by mouth daily.  . traZODone (DESYREL) 50 MG tablet Take 1 tablet by mouth at bedtime as needed.   . vortioxetine HBr (TRINTELLIX) 10 MG TABS Take 1 tablet (10 mg total) by mouth daily.   No current facility-administered medications on file prior to visit.      Chief Complaint  Patient presents with  . Sleep Apnea    Currently using CPAP every night. DME is [facility].     Tests PSG 12/21/91 >> AHI 56 Auto CPAP 01/28/16 to 03/25/16 >> used on 58 of 58 days with average 7 hrs 49 min.  Average AHI 2.9 with median CPAP 11 and 95 th percentile CPAP 13 cm H2O  Past medical hx DVT 1996/2001/2002, BPH, Back pain, HLD, GERD, HH, IBS, Ulcerative colitis, Nephrolithiasis, Glaucoma    Past surgical hx, Allergies, Family hx, Social hx all reviewed.  Vital Signs BP 128/72 (BP Location: Left Arm, Cuff Size: Normal)   Pulse 64   Ht 5' 7"  (1.702 m)   Wt 227 lb (103 kg)   SpO2 95%   BMI 35.55 kg/m   History of Present Illness [name] is a 65 y.o. male with obstructive sleep apnea.  He is here for follow up after getting new CPAP machine to satisfy insurance requirements.  He has nasal mask.  He is getting intermittent sinus congestion.  Otherwise CPAP works well and helps his sleep.    Physical Exam  General - No distress ENT - No sinus tenderness, no oral exudate, no LAN, MP 3 Cardiac - s1s2 regular, no murmur Chest - No  wheeze/rales/dullness Back - No focal tenderness Abd - Soft, non-tender Ext - No edema Neuro - Normal strength Skin - No rashes Psych - normal mood, and behavior   Assessment/Plan  Obstructive sleep apnea. - He is compliant with CPAP and reports benefit from therapy - Continue auto CPAP range 5 to 20 cm H2O   Patient Instructions  Follow up in 1 year    Chesley Mires, MD [place] [facility]/Critical Care/Sleep Pager:  [phone] 03/27/2016, 4:54 PM

## 2016-03-27 NOTE — Patient Instructions (Signed)
Follow up in 1 year.

## 2016-03-31 ENCOUNTER — Other Ambulatory Visit: Payer: Self-pay

## 2016-04-03 ENCOUNTER — Ambulatory Visit (INDEPENDENT_AMBULATORY_CARE_PROVIDER_SITE_OTHER): Payer: Medicare Other | Admitting: Family Medicine

## 2016-04-03 ENCOUNTER — Encounter: Payer: Self-pay | Admitting: Family Medicine

## 2016-04-03 VITALS — BP 140/86 | HR 62 | Temp 98.9°F | Resp 14 | Ht 67.0 in | Wt 224.0 lb

## 2016-04-03 DIAGNOSIS — Z0000 Encounter for general adult medical examination without abnormal findings: Secondary | ICD-10-CM | POA: Diagnosis not present

## 2016-04-03 DIAGNOSIS — Z23 Encounter for immunization: Secondary | ICD-10-CM | POA: Diagnosis not present

## 2016-04-03 NOTE — Progress Notes (Signed)
Subjective:    Patient ID: [name], male    DOB: [birth year], 65 y.o.   MRN: [id number]  HPI Patient is here today for complete physical exam. Overall he is doing well. He does complain of pain in his left heel near the insertion of the plantar fascia. He is tender to palpation in that area. There is no erythema. There is no swelling. The pain is worse in the morning or after sitting a long period of time consistent with plantar fasciitis. Colonoscopy was performed this year in June and was normal. It is good for 10 years. PSA was 1.1 and is normal. Patient declines digital rectal exam. Immunizations are up-to-date however he is due for annual flu shot. He is also due for a booster on Pneumovax 23 as his previous vaccine was given prior to 63. The remainder of his preventative care is up-to-date. Most recent lab work as listed below: Recent Results (from the past 2160 hour(s))  COMPLETE METABOLIC PANEL WITH GFR     Status: Abnormal   Collection Time: 03/27/16 10:24 AM  Result Value Ref Range   Sodium 140 135 - 146 mmol/L   Potassium 4.3 3.5 - 5.3 mmol/L   Chloride 102 98 - 110 mmol/L   CO2 25 20 - 31 mmol/L   Glucose, Bld 103 (H) 70 - 99 mg/dL   BUN 18 7 - 25 mg/dL   Creat 0.98 0.70 - 1.25 mg/dL    Comment:   For patients > or = 67 years of age: The upper reference limit for Creatinine is approximately 13% higher for people identified as African-American.      Total Bilirubin 0.6 0.2 - 1.2 mg/dL   Alkaline Phosphatase 54 40 - 115 U/L   AST 22 10 - 35 U/L   ALT 33 9 - 46 U/L   Total Protein 6.9 6.1 - 8.1 g/dL   Albumin 4.3 3.6 - 5.1 g/dL   Calcium 9.4 8.6 - 10.3 mg/dL   GFR, Est African American >89 >=60 mL/min   GFR, Est Non African American 80 >=60 mL/min  TSH     Status: None   Collection Time: 03/27/16 10:24 AM  Result Value Ref Range   TSH 3.20 0.40 - 4.50 mIU/L  Lipid panel     Status: Abnormal   Collection Time: 03/27/16 10:24 AM  Result Value Ref Range   Cholesterol 231 (H) 125 - 200 mg/dL   Triglycerides 92 <150 mg/dL   HDL 63 >=40 mg/dL   Total CHOL/HDL Ratio 3.7 <=5.0 Ratio   VLDL 18 <30 mg/dL   LDL Cholesterol 150 (H) <130 mg/dL    Comment:   Total Cholesterol/HDL Ratio:CHD Risk                        Coronary Heart Disease Risk Table                                        Men       Women          1/2 Average Risk              3.4        3.3              Average Risk              5.0  4.4           2X Average Risk              9.6        7.1           3X Average Risk             23.4       11.0 Use the calculated Patient Ratio above and the CHD Risk table  to determine the patient's CHD Risk.   CBC with Differential/Platelet     Status: Abnormal   Collection Time: 03/27/16 10:24 AM  Result Value Ref Range   WBC 7.9 3.8 - 10.8 K/uL   RBC 4.75 4.20 - 5.80 MIL/uL   Hemoglobin 15.4 13.0 - 17.0 g/dL   HCT 44.2 38.5 - 50.0 %   MCV 93.1 80.0 - 100.0 fL   MCH 32.4 27.0 - 33.0 pg   MCHC 34.8 32.0 - 36.0 g/dL   RDW 13.5 11.0 - 15.0 %   Platelets 180 140 - 400 K/uL   MPV 9.9 7.5 - 12.5 fL   Neutro Abs 5,846 1,500 - 7,800 cells/uL   Lymphs Abs 1,264 850 - 3,900 cells/uL   Monocytes Absolute 790 200 - 950 cells/uL   Eosinophils Absolute 0 (L) 15 - 500 cells/uL   Basophils Absolute 0 0 - 200 cells/uL   Neutrophils Relative % 74 %   Lymphocytes Relative 16 %   Monocytes Relative 10 %   Eosinophils Relative 0 %   Basophils Relative 0 %   Smear Review Criteria for review not met   PSA     Status: None   Collection Time: 03/27/16 10:24 AM  Result Value Ref Range   PSA 1.1 <=4.0 ng/mL    Comment:   The total PSA value from this assay system is standardized against the WHO standard. The test result will be approximately 20% lower when compared to the equimolar-standardized total PSA ([employer] Coul[employer]). Comparison of serial PSA results should be interpreted with this fact in mind.   This test was performed using the Siemens  chemiluminescent method. Values obtained from different assay methods cannot be used interchangeably. PSA levels, regardless of value, should not be interpreted as absolute evidence of the presence or absence of disease.   Effective March 09, 2016, Total PSA is being tested on the Siemens Centaur XP using chemiluminescence methodology. Re-baseline testing will be available until June 08, 2016 at no charge. If you have a patient that may require re-baselining, please order [id number] in addition to [id number].     Immunization History  Administered Date(s) Administered  . Influenza Split 07/04/2012, 03/03/2013  . Influenza,inj,Quad PF,36+ Mos 05/03/2014, 04/30/2015  . Pneumococcal Conjugate-13 05/03/2014  . Pneumococcal Polysaccharide-23 08/03/2008  . Tdap 08/03/2010  . Zoster 08/03/2010    Past Medical History:  Diagnosis Date  . Arthritis    oa and ddd  . Back pain, chronic    pt states he has 3 herniated disks--uses fentanyl patch for pain  . Blood transfusion without reported diagnosis 1950   rH negative  . BPH (benign prostatic hyperplasia)    frequent urination, not able to hold urine well  . 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 Colon polyps   . Complication of anesthesia    pt had spinal for a knee replacement--"woke up" during surgery--and sick after surgery  . Diverticulosis   . DVT (deep venous thrombosis) ([place]) 2001 or 2002  right leg-required hospitalization x 8 days  . DVT (deep venous thrombosis) ([place]) 1996   right  . GERD (gastroesophageal reflux disease)   . Glaucoma    both eyes  . H/O hiatal hernia   . Hemorrhoid   . History of IBS   . History of kidney stones    x 1  . Hyperlipidemia 2012  . Lumbago   . Sleep apnea    pt uses cpap - setting of 16  . Ulcer 1966  . Ulcerative colitis ([place])   . Wears glasses   . Wears hearing aid    Past Surgical History:  Procedure Laterality Date  . APPENDECTOMY  1993  . BACK SURGERY  2008      bone spur removed   . bilateral carpal tunnel release 2009    . cervical fusion 2011--pt has good range of motion neck    . CHOLECYSTECTOMY  1994  . JOINT REPLACEMENT     2001 left total knee and 1985 right total knee  . MASS EXCISION Left 01/04/2014   Procedure: LEFT MIDDLE FINGER MASS EXCISION WITH NAILBED REPAIR AND ADVANCEMENT FLAP;  Surgeon: [name], MD;  Location: [facility];  Service: Orthopedics;  Laterality: Left;  . mass removed from 2nd finger Left aug 2014   squamous carcinoma  . nissen fundoplication [id number]    . PROSTATE SURGERY  2014   TURP  . right wrist fusion 12/12 - pt wearing brace on the wrist-not started physical therapy yet    . SPINE SURGERY  2009   cervical spine fusion  . TOTAL KNEE REVISION Right 11/17/2013   Procedure: RIGHT KNEE POLYETHYLENE REVISION, bone graft;  Surgeon: [name], MD;  Location: [facility];  Service: Orthopedics;  Laterality: Right;  . TRANSURETHRAL RESECTION OF PROSTATE  10/09/2011   Procedure: TRANSURETHRAL RESECTION OF THE PROSTATE WITH GYRUS INSTRUMENTS;  Surgeon: Fredricka Bonine, MD;  Location: [facility];  Service: Urology;  Laterality: N/A;  . uvvvp     Current Outpatient Prescriptions on File Prior to Visit  Medication Sig Dispense Refill  . aspirin 81 MG tablet Take 81 mg by mouth daily.    . Calcium Carbonate-Vitamin D (CALCIUM + D PO) Take 1 tablet by mouth daily.    . fentaNYL (DURAGESIC - DOSED MCG/HR) 25 MCG/HR Place 1 patch onto the skin every 3 (three) days. For chronic back pain    . fish oil-omega-3 fatty acids 1000 MG capsule Take 3 g by mouth daily.     . fluticasone (FLONASE) 50 MCG/ACT nasal spray Place 1 spray into the nose every evening.     . Multiple Vitamin (MULTIVITAMIN WITH MINERALS) TABS tablet Take 1 tablet by mouth daily.    . traZODone (DESYREL) 50 MG tablet Take 1 tablet by mouth at bedtime as needed.     . vortioxetine HBr (TRINTELLIX) 10 MG TABS Take 1 tablet (10 mg total) by  mouth daily. 14 tablet 0   No current facility-administered medications on file prior to visit.    Allergies  Allergen Reactions  . Other     Pt allergic to some tapes--pt request that we only use paper tape!!   Social History   Social History  . Marital status: Single    Spouse name: N/A  . Number of children: N/A  . Years of education: N/A   Occupational History  . disabled    Social History Main Topics  . Smoking status: Former Smoker  Packs/day: 1.00    Years: 19.00    Types: Cigarettes    Quit date: 08/04/1983  . Smokeless tobacco: Never Used     Comment: quit smoking 1985  . Alcohol use 3.6 oz/week    6 Cans of beer per week     Comment: maybe 2 or 3 beers per week  . Drug use: No  . Sexual activity: Not Currently   Other Topics Concern  . Not on file   Social History Narrative  . No narrative on file   Family History  Problem Relation Age of Onset  . Colon cancer Father   . Bone cancer Father     deceased  . Cancer Father   . Diabetes Father   . Prostate cancer Father   . Arthritis Mother   . Hearing loss Mother   . Hyperlipidemia Mother   . Hypertension Mother   . Deep vein thrombosis Mother   . Diabetes Sister   . Hearing loss Maternal Grandmother   . Diabetes Paternal Grandfather      Review of Systems  All other systems reviewed and are negative.      Objective:   Physical Exam  Constitutional: He is oriented to person, place, and time. He appears well-developed and well-nourished. No distress.  HENT:  Head: Normocephalic and atraumatic.  Right Ear: External ear normal.  Left Ear: External ear normal.  Nose: Nose normal.  Mouth/Throat: Oropharynx is clear and moist. No oropharyngeal exudate.  Eyes: Conjunctivae and EOM are normal. Pupils are equal, round, and reactive to light. Right eye exhibits no discharge. Left eye exhibits no discharge. No scleral icterus.  Neck: Normal range of motion. Neck supple. No JVD present. No tracheal  deviation present. No thyromegaly present.  Cardiovascular: Normal rate, regular rhythm, normal heart sounds and intact distal pulses.  Exam reveals no gallop and no friction rub.   No murmur heard. Pulmonary/Chest: Effort normal and breath sounds normal. No stridor. No respiratory distress. He has no wheezes. He has no rales. He exhibits no tenderness.  Abdominal: Soft. Bowel sounds are normal. He exhibits no distension and no mass. There is no tenderness. There is no rebound and no guarding.  Genitourinary: Rectum normal, prostate normal and penis normal.  Musculoskeletal: Normal range of motion. He exhibits no edema, tenderness or deformity.  Lymphadenopathy:    He has no cervical adenopathy.  Neurological: He is alert and oriented to person, place, and time. He has normal reflexes. He displays normal reflexes. No cranial nerve deficit. He exhibits normal muscle tone. Coordination normal.  Skin: Skin is warm. No rash noted. He is not diaphoretic. No erythema. No pallor.  Psychiatric: He has a normal mood and affect. His behavior is normal. Judgment and thought content normal.  Vitals reviewed.         Assessment & Plan:  Gen Med exam. I would like the patient's LDL cholesterol to be below 130. I have recommended a diet high in fruits and vegetables. I recommended less red meat, pork, fried foods, fast food. I recommended increasing aerobic exercise to 30 minutes a day 5 days a week. I recommended 5-10 pounds weight loss at least. I would like to recheck in 6 months. Otherwise his lab work is excellent. His blood pressure today is borderline but it is typically well controlled and he monitors it on a daily basis at home. He received Pneumovax 23 as well as his flu shot today. The remainder of his preventative care is up-to-date.  Regular anticipatory guidance is provided. If his heel pain worsens, he can return for cortisone injection for plantar fasciitis. We discussed stretches to perform. He  is also having some numbness in his left arm in the distribution of the ulnar nerve. Should this worsen, I would recommend nerve conduction studies.

## 2016-04-03 NOTE — Addendum Note (Signed)
Addended by: Sheral Flow on: 04/03/2016 09:48 AM   Modules accepted: Orders

## 2016-05-08 ENCOUNTER — Ambulatory Visit (INDEPENDENT_AMBULATORY_CARE_PROVIDER_SITE_OTHER): Payer: Medicare Other | Admitting: Family Medicine

## 2016-05-08 ENCOUNTER — Encounter: Payer: Self-pay | Admitting: Family Medicine

## 2016-05-08 VITALS — BP 118/72 | HR 68 | Temp 98.3°F | Resp 16 | Ht 67.0 in | Wt 223.0 lb

## 2016-05-08 DIAGNOSIS — K4090 Unilateral inguinal hernia, without obstruction or gangrene, not specified as recurrent: Secondary | ICD-10-CM | POA: Diagnosis not present

## 2016-05-08 NOTE — Progress Notes (Signed)
Subjective:    Patient ID: [name], male    DOB: 1950/09/18, 65 y.o.   MRN: [id number]  HPI  The patient has a history of bilateral small inguinal hernias confirmed on a CT scan 2016. Recently he developed pain in his left inguinal canal. The pain is burning and intense. It is worse when he stands up. It is better when he lies down. In the past she has had a bulging hernia however today it is not bulging. There is no evidence of an incarcerated hernia. He denies any hematuria or dysuria. The pain does radiate into his testicle. On examination today the test was normal without nodularity. The pain is not similar to kidney stones his head in the past. He denies any nausea vomiting or diarrhea. He denies any blood in the stool. Past Medical History:  Diagnosis Date  . Arthritis    oa and ddd  . Back pain, chronic    pt states he has 3 herniated disks--uses fentanyl patch for pain  . Blood transfusion without reported diagnosis 1950   rH negative  . BPH (benign prostatic hyperplasia)    frequent urination, not able to hold urine well  . 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 Colon polyps   . Complication of anesthesia    pt had spinal for a knee replacement--"woke up" during surgery--and sick after surgery  . Diverticulosis   . DVT (deep venous thrombosis) ([place]) 2001 or 2002   right leg-required hospitalization x 8 days  . DVT (deep venous thrombosis) ([place]) 1996   right  . GERD (gastroesophageal reflux disease)   . Glaucoma    both eyes  . H/O hiatal hernia   . Hemorrhoid   . History of IBS   . History of kidney stones    x 1  . Hyperlipidemia 2012  . Lumbago   . Sleep apnea    pt uses cpap - setting of 16  . Ulcer ([place]) 1966  . Ulcerative colitis ([place])   . Wears glasses   . Wears hearing aid    Past Surgical History:  Procedure Laterality Date  . APPENDECTOMY  1993  . BACK SURGERY  2008    bone spur removed   . bilateral carpal tunnel release 2009     . cervical fusion 2011--pt has good range of motion neck    . CHOLECYSTECTOMY  1994  . JOINT REPLACEMENT     2001 left total knee and 1985 right total knee  . MASS EXCISION Left 01/04/2014   Procedure: LEFT MIDDLE FINGER MASS EXCISION WITH NAILBED REPAIR AND ADVANCEMENT FLAP;  Surgeon: [name], MD;  Location: [facility];  Service: Orthopedics;  Laterality: Left;  . mass removed from 2nd finger Left aug 2014   squamous carcinoma  . nissen fundoplication [id number]    . PROSTATE SURGERY  2014   TURP  . right wrist fusion 12/12 - pt wearing brace on the wrist-not started physical therapy yet    . SPINE SURGERY  2009   cervical spine fusion  . TOTAL KNEE REVISION Right 11/17/2013   Procedure: RIGHT KNEE POLYETHYLENE REVISION, bone graft;  Surgeon: [name], MD;  [facility];  Service: Orthopedics;  Laterality: Right;  . TRANSURETHRAL RESECTION OF PROSTATE  10/09/2011   Procedure: TRANSURETHRAL RESECTION OF THE PROSTATE WITH GYRUS INSTRUMENTS;  Surgeon: Fredricka Bonine, MD;  [facility];  Service: Urology;  Laterality: N/A;  .  uvvvp     Current Outpatient Prescriptions on File Prior to Visit  Medication Sig Dispense Refill  . aspirin 81 MG tablet Take 81 mg by mouth daily.    . Calcium Carbonate-Vitamin D (CALCIUM + D PO) Take 1 tablet by mouth daily.    . fentaNYL (DURAGESIC - DOSED MCG/HR) 25 MCG/HR Place 1 patch onto the skin every 3 (three) days. For chronic back pain    . fish oil-omega-3 fatty acids 1000 MG capsule Take 3 g by mouth daily.     . fluticasone (FLONASE) 50 MCG/ACT nasal spray Place 1 spray into the nose every evening.     . Multiple Vitamin (MULTIVITAMIN WITH MINERALS) TABS tablet Take 1 tablet by mouth daily.    . traZODone (DESYREL) 50 MG tablet Take 1 tablet by mouth at bedtime as needed.     . vortioxetine HBr (TRINTELLIX) 10 MG TABS Take 1 tablet (10 mg total) by mouth daily. 14 tablet 0   No current facility-administered  medications on file prior to visit.    Allergies  Allergen Reactions  . Other     Pt allergic to some tapes--pt request that we only use paper tape!!   Social History   Social History  . Marital status: Single    Spouse name: N/A  . Number of children: N/A  . Years of education: N/A   Occupational History  . disabled    Social History Main Topics  . Smoking status: Former Smoker    Packs/day: 1.00    Years: 19.00    Types: Cigarettes    Quit date: 08/04/1983  . Smokeless tobacco: Never Used     Comment: quit smoking 1985  . Alcohol use 3.6 oz/week    6 Cans of beer per week     Comment: maybe 2 or 3 beers per week  . Drug use: No  . Sexual activity: Not Currently   Other Topics Concern  . Not on file   Social History Narrative  . No narrative on file     Review of Systems  All other systems reviewed and are negative.      Objective:   Physical Exam  Cardiovascular: Normal rate, regular rhythm and normal heart sounds.   Pulmonary/Chest: Effort normal and breath sounds normal.  Abdominal: Soft. Bowel sounds are normal. He exhibits no distension and no mass. There is no tenderness. There is no rebound and no guarding.  Vitals reviewed.         Assessment & Plan:  Left inguinal hernia - Plan: Ambulatory referral to General Surgery  I will refer the patient to a general surgeon for evaluation and treatment of his left renal hernia now it is causing him pain

## 2016-05-12 DIAGNOSIS — H401131 Primary open-angle glaucoma, bilateral, mild stage: Secondary | ICD-10-CM | POA: Diagnosis not present

## 2016-05-28 DIAGNOSIS — Z79891 Long term (current) use of opiate analgesic: Secondary | ICD-10-CM | POA: Diagnosis not present

## 2016-05-28 DIAGNOSIS — G894 Chronic pain syndrome: Secondary | ICD-10-CM | POA: Diagnosis not present

## 2016-05-28 DIAGNOSIS — M5126 Other intervertebral disc displacement, lumbar region: Secondary | ICD-10-CM | POA: Diagnosis not present

## 2016-05-28 DIAGNOSIS — M961 Postlaminectomy syndrome, not elsewhere classified: Secondary | ICD-10-CM | POA: Diagnosis not present

## 2016-06-02 ENCOUNTER — Encounter: Payer: Self-pay | Admitting: Family Medicine

## 2016-06-02 DIAGNOSIS — M961 Postlaminectomy syndrome, not elsewhere classified: Secondary | ICD-10-CM | POA: Insufficient documentation

## 2016-06-03 DIAGNOSIS — G4733 Obstructive sleep apnea (adult) (pediatric): Secondary | ICD-10-CM | POA: Diagnosis not present

## 2016-06-03 DIAGNOSIS — K4020 Bilateral inguinal hernia, without obstruction or gangrene, not specified as recurrent: Secondary | ICD-10-CM | POA: Diagnosis not present

## 2016-06-04 ENCOUNTER — Other Ambulatory Visit: Payer: Self-pay | Admitting: Surgery

## 2016-06-04 DIAGNOSIS — R197 Diarrhea, unspecified: Secondary | ICD-10-CM | POA: Diagnosis not present

## 2016-06-04 DIAGNOSIS — R1030 Lower abdominal pain, unspecified: Secondary | ICD-10-CM

## 2016-06-09 ENCOUNTER — Ambulatory Visit
Admission: RE | Admit: 2016-06-09 | Discharge: 2016-06-09 | Disposition: A | Discharge status: Home / Self Care | Payer: Medicare Other | Source: Ambulatory Visit | Attending: Surgery | Admitting: Surgery

## 2016-06-09 DIAGNOSIS — R1030 Lower abdominal pain, unspecified: Secondary | ICD-10-CM

## 2016-06-09 DIAGNOSIS — K4090 Unilateral inguinal hernia, without obstruction or gangrene, not specified as recurrent: Secondary | ICD-10-CM | POA: Diagnosis not present

## 2016-06-09 MED ORDER — IOPAMIDOL (ISOVUE-300) INJECTION 61%
100.0000 mL | Freq: Once | INTRAVENOUS | Status: AC | PRN
  Administered 2016-06-09: 100 mL via INTRAVENOUS

## 2016-06-17 ENCOUNTER — Telehealth: Payer: Self-pay | Admitting: Family Medicine

## 2016-06-17 MED ORDER — VORTIOXETINE HBR 10 MG PO TABS: 10.0000 mg | ORAL_TABLET | Freq: Every day | ORAL | 0 refills | Status: DC

## 2016-06-17 NOTE — Telephone Encounter (Signed)
Medication called/sent to requested pharmacy  

## 2016-06-17 NOTE — Telephone Encounter (Signed)
Patient is calling to see if trintrillix can be called into pharmacy [facility] [place], because we are out of samples

## 2016-06-30 ENCOUNTER — Encounter (HOSPITAL_COMMUNITY): Payer: Self-pay | Admitting: *Deleted

## 2016-07-21 NOTE — Progress Notes (Signed)
Surgery on 12/29.  preop on 12/22.  Needs orders in EPIC.  Thank You.

## 2016-07-22 ENCOUNTER — Ambulatory Visit: Payer: Self-pay | Admitting: Surgery

## 2016-07-23 NOTE — Patient Instructions (Addendum)
[name]  07/23/2016   Your procedure is scheduled on: 07-31-16  Report to [facility] Main  Entrance take [facility]  elevators to 3rd floor to  [place] at  0900 AM.  Call this number if you have problems the morning of surgery [phone] Bring cpap mask/tubing Fleet Enema(sodium phosphate) 1 Adult  Rectally-night before surgery.  Remember: ONLY 1 PERSON MAY GO WITH YOU TO SHORT STAY TO GET  READY MORNING OF YOUR SURGERY.  Do not eat food or drink liquids :After Midnight.     Take these medicines the morning of surgery with A SIP OF WATER: Eye drops/Flonase-usual. DO NOT TAKE ANY DIABETIC MEDICATIONS DAY OF YOUR SURGERY                               You may not have any metal on your body including hair pins and              piercings  Do not wear jewelry, make-up, lotions, powders or perfumes, deodorant             Do not wear nail polish.  Do not shave  48 hours prior to surgery.              Men may shave face and neck.   Do not bring valuables to the hospital. [place].  Contacts, dentures or bridgework may not be worn into surgery.  Leave suitcase in the car. After surgery it may be brought to your room.     Patients discharged the day of surgery will not be allowed to drive home.  Name and phone number of your driver:[name], significant other [phone] cell   Special Instructions: N/A              Please read over the following fact sheets you were given: _____________________________________________________________________             [facility] - Preparing for Surgery Before surgery, you can play an important role.  Because skin is not sterile, your skin needs to be as free of germs as possible.  You can reduce the number of germs on your skin by washing with CHG (chlorahexidine gluconate) soap before surgery.  CHG is an antiseptic cleaner which kills germs and bonds with the  skin to continue killing germs even after washing. Please DO NOT use if you have an allergy to CHG or antibacterial soaps.  If your skin becomes reddened/irritated stop using the CHG and inform your nurse when you arrive at Short Stay. Do not shave (including legs and underarms) for at least 48 hours prior to the first CHG shower.  You may shave your face/neck. Please follow these instructions carefully:  1.  Shower with CHG Soap the night before surgery and the  morning of Surgery.  2.  If you choose to wash your hair, wash your hair first as usual with your  normal  shampoo.  3.  After you shampoo, rinse your hair and body thoroughly to remove the  shampoo.                           4.  Use CHG as you  would any other liquid soap.  You can apply chg directly  to the skin and wash                       Gently with a scrungie or clean washcloth.  5.  Apply the CHG Soap to your body ONLY FROM THE NECK DOWN.   Do not use on face/ open                           Wound or open sores. Avoid contact with eyes, ears mouth and genitals (private parts).                       Wash face,  Genitals (private parts) with your normal soap.             6.  Wash thoroughly, paying special attention to the area where your surgery  will be performed.  7.  Thoroughly rinse your body with warm water from the neck down.  8.  DO NOT shower/wash with your normal soap after using and rinsing off  the CHG Soap.                9.  Pat yourself dry with a clean towel.            10.  Wear clean pajamas.            11.  Place clean sheets on your bed the night of your first shower and do not  sleep with pets. Day of Surgery : Do not apply any lotions/deodorants the morning of surgery.  Please wear clean clothes to the hospital/surgery center.  FAILURE TO FOLLOW THESE INSTRUCTIONS MAY RESULT IN THE CANCELLATION OF YOUR SURGERY PATIENT SIGNATURE_________________________________  NURSE  SIGNATURE__________________________________  ________________________________________________________________________

## 2016-07-24 ENCOUNTER — Encounter (HOSPITAL_COMMUNITY): Payer: Self-pay

## 2016-07-24 ENCOUNTER — Encounter (HOSPITAL_COMMUNITY)
Admission: RE | Admit: 2016-07-24 | Discharge: 2016-07-24 | Disposition: A | Discharge status: Home / Self Care | Payer: Medicare Other | Source: Ambulatory Visit | Attending: Surgery | Admitting: Surgery

## 2016-07-24 DIAGNOSIS — G4730 Sleep apnea, unspecified: Secondary | ICD-10-CM | POA: Insufficient documentation

## 2016-07-24 DIAGNOSIS — Z0183 Encounter for blood typing: Secondary | ICD-10-CM | POA: Insufficient documentation

## 2016-07-24 DIAGNOSIS — Z01818 Encounter for other preprocedural examination: Secondary | ICD-10-CM | POA: Insufficient documentation

## 2016-07-24 DIAGNOSIS — Z01812 Encounter for preprocedural laboratory examination: Secondary | ICD-10-CM | POA: Diagnosis not present

## 2016-07-24 DIAGNOSIS — K4020 Bilateral inguinal hernia, without obstruction or gangrene, not specified as recurrent: Secondary | ICD-10-CM | POA: Diagnosis not present

## 2016-07-24 HISTORY — DX: Major depressive disorder, single episode, unspecified: F32.9

## 2016-07-24 HISTORY — DX: Unspecified hearing loss, bilateral: H91.93

## 2016-07-24 HISTORY — DX: Depression, unspecified: F32.A

## 2016-07-24 LAB — CBC
HEMATOCRIT: 44 % (ref 39.0–52.0)
HEMOGLOBIN: 15.7 g/dL (ref 13.0–17.0)
MCH: 32.2 pg (ref 26.0–34.0)
MCHC: 35.7 g/dL (ref 30.0–36.0)
MCV: 90.3 fL (ref 78.0–100.0)
Platelets: 199 10*3/uL (ref 150–400)
RBC: 4.87 MIL/uL (ref 4.22–5.81)
RDW: 12.6 % (ref 11.5–15.5)
WBC: 5.6 10*3/uL (ref 4.0–10.5)

## 2016-07-24 NOTE — Pre-Procedure Instructions (Signed)
CT abd/pelvis 11'17 Epic.

## 2016-07-31 ENCOUNTER — Ambulatory Visit (HOSPITAL_COMMUNITY): Payer: Medicare Other | Admitting: Anesthesiology

## 2016-07-31 ENCOUNTER — Encounter (HOSPITAL_COMMUNITY): Payer: Self-pay | Admitting: *Deleted

## 2016-07-31 ENCOUNTER — Encounter (HOSPITAL_COMMUNITY)
Admission: RE | Disposition: A | Discharge status: Home / Self Care | Payer: Self-pay | Source: Ambulatory Visit | Attending: Surgery

## 2016-07-31 ENCOUNTER — Observation Stay (HOSPITAL_COMMUNITY)
Admission: RE | Admit: 2016-07-31 | Discharge: 2016-08-01 | Disposition: A | Discharge status: Home / Self Care | Payer: Medicare Other | Source: Ambulatory Visit | Attending: Surgery | Admitting: Surgery

## 2016-07-31 DIAGNOSIS — Z86718 Personal history of other venous thrombosis and embolism: Secondary | ICD-10-CM | POA: Insufficient documentation

## 2016-07-31 DIAGNOSIS — Z9889 Other specified postprocedural states: Secondary | ICD-10-CM

## 2016-07-31 DIAGNOSIS — Z7982 Long term (current) use of aspirin: Secondary | ICD-10-CM | POA: Insufficient documentation

## 2016-07-31 DIAGNOSIS — K4020 Bilateral inguinal hernia, without obstruction or gangrene, not specified as recurrent: Secondary | ICD-10-CM | POA: Diagnosis not present

## 2016-07-31 DIAGNOSIS — Z79899 Other long term (current) drug therapy: Secondary | ICD-10-CM | POA: Diagnosis not present

## 2016-07-31 DIAGNOSIS — Z86711 Personal history of pulmonary embolism: Secondary | ICD-10-CM | POA: Insufficient documentation

## 2016-07-31 DIAGNOSIS — R1031 Right lower quadrant pain: Secondary | ICD-10-CM | POA: Diagnosis not present

## 2016-07-31 DIAGNOSIS — K219 Gastro-esophageal reflux disease without esophagitis: Secondary | ICD-10-CM | POA: Diagnosis not present

## 2016-07-31 DIAGNOSIS — G4733 Obstructive sleep apnea (adult) (pediatric): Secondary | ICD-10-CM | POA: Diagnosis not present

## 2016-07-31 DIAGNOSIS — F329 Major depressive disorder, single episode, unspecified: Secondary | ICD-10-CM | POA: Insufficient documentation

## 2016-07-31 DIAGNOSIS — Z7951 Long term (current) use of inhaled steroids: Secondary | ICD-10-CM | POA: Diagnosis not present

## 2016-07-31 DIAGNOSIS — Z87891 Personal history of nicotine dependence: Secondary | ICD-10-CM | POA: Diagnosis not present

## 2016-07-31 DIAGNOSIS — Z8719 Personal history of other diseases of the digestive system: Secondary | ICD-10-CM

## 2016-07-31 HISTORY — PX: XI ROBOTIC ASSISTED INGUINAL HERNIA REPAIR WITH MESH: SHX6706

## 2016-07-31 HISTORY — PX: INSERTION OF MESH: SHX5868

## 2016-07-31 LAB — CREATININE, SERUM
Creatinine, Ser: 1.03 mg/dL (ref 0.61–1.24)
GFR calc Af Amer: >60 mL/min (ref >60)
GFR calc non Af Amer: >60 mL/min (ref >60)

## 2016-07-31 LAB — CBC
HEMATOCRIT: 43.6 % (ref 39.0–52.0)
Hemoglobin: 15.2 g/dL (ref 13.0–17.0)
MCH: 31.6 pg (ref 26.0–34.0)
MCHC: 34.9 g/dL (ref 30.0–36.0)
MCV: 90.6 fL (ref 78.0–100.0)
PLATELETS: 180 10*3/uL (ref 150–400)
RBC: 4.81 MIL/uL (ref 4.22–5.81)
RDW: 12.4 % (ref 11.5–15.5)
WBC: 11.5 10*3/uL — AB (ref 4.0–10.5)

## 2016-07-31 LAB — TYPE AND SCREEN
ABO/RH(D): O POS
ANTIBODY SCREEN: NEGATIVE

## 2016-07-31 SURGERY — REPAIR, HERNIA, INGUINAL, ROBOT-ASSISTED, LAPAROSCOPIC, USING MESH
Anesthesia: General | Site: Groin | Laterality: Bilateral

## 2016-07-31 MED ORDER — CEFOTETAN DISODIUM-DEXTROSE 2-2.08 GM-% IV SOLR
INTRAVENOUS | Status: AC
  Filled 2016-07-31: qty 50

## 2016-07-31 MED ORDER — DEXAMETHASONE SODIUM PHOSPHATE 10 MG/ML IJ SOLN
INTRAMUSCULAR | Status: DC | PRN
  Administered 2016-07-31: 10 mg via INTRAVENOUS

## 2016-07-31 MED ORDER — ONDANSETRON HCL 4 MG/2ML IJ SOLN: 4.0000 mg | Freq: Four times a day (QID) | INTRAMUSCULAR | Status: DC | PRN

## 2016-07-31 MED ORDER — SUGAMMADEX SODIUM 200 MG/2ML IV SOLN
INTRAVENOUS | Status: DC | PRN
  Administered 2016-07-31: 200 mg via INTRAVENOUS

## 2016-07-31 MED ORDER — CHLORHEXIDINE GLUCONATE CLOTH 2 % EX PADS: 6.0000 | MEDICATED_PAD | Freq: Once | CUTANEOUS | Status: DC

## 2016-07-31 MED ORDER — HEPARIN SODIUM (PORCINE) 5000 UNIT/ML IJ SOLN
5000.0000 [IU] | Freq: Three times a day (TID) | INTRAMUSCULAR | Status: DC
  Administered 2016-07-31 – 2016-08-01 (×2): 5000 [IU] via SUBCUTANEOUS
  Filled 2016-07-31 (×2): qty 1

## 2016-07-31 MED ORDER — GABAPENTIN 300 MG PO CAPS
300.0000 mg | ORAL_CAPSULE | ORAL | Status: AC
  Administered 2016-07-31: 300 mg via ORAL
  Filled 2016-07-31: qty 1

## 2016-07-31 MED ORDER — EPHEDRINE 5 MG/ML INJ
INTRAVENOUS | Status: AC
  Filled 2016-07-31: qty 10

## 2016-07-31 MED ORDER — ONDANSETRON HCL 4 MG/2ML IJ SOLN
INTRAMUSCULAR | Status: DC | PRN
  Administered 2016-07-31: 4 mg via INTRAVENOUS

## 2016-07-31 MED ORDER — TRAZODONE HCL 50 MG PO TABS: 100.0000 mg | ORAL_TABLET | Freq: Every evening | ORAL | Status: DC | PRN

## 2016-07-31 MED ORDER — PROPOFOL 10 MG/ML IV BOLUS
INTRAVENOUS | Status: DC | PRN
  Administered 2016-07-31: 200 mg via INTRAVENOUS

## 2016-07-31 MED ORDER — SODIUM CHLORIDE 0.9 % IJ SOLN
INTRAMUSCULAR | Status: AC
  Filled 2016-07-31: qty 20

## 2016-07-31 MED ORDER — PROPOFOL 10 MG/ML IV BOLUS
INTRAVENOUS | Status: AC
  Filled 2016-07-31: qty 20

## 2016-07-31 MED ORDER — HYDROMORPHONE HCL 1 MG/ML IJ SOLN
INTRAMUSCULAR | Status: AC
  Filled 2016-07-31: qty 1

## 2016-07-31 MED ORDER — BUPIVACAINE LIPOSOME 1.3 % IJ SUSP
INTRAMUSCULAR | Status: DC | PRN
  Administered 2016-07-31: 20 mL

## 2016-07-31 MED ORDER — BUPIVACAINE LIPOSOME 1.3 % IJ SUSP
20.0000 mL | Freq: Once | INTRAMUSCULAR | Status: DC
  Filled 2016-07-31: qty 20

## 2016-07-31 MED ORDER — LACTATED RINGERS IR SOLN
Status: DC | PRN
  Administered 2016-07-31: 3000 mL

## 2016-07-31 MED ORDER — ROCURONIUM BROMIDE 50 MG/5ML IV SOSY
PREFILLED_SYRINGE | INTRAVENOUS | Status: AC
  Filled 2016-07-31: qty 5

## 2016-07-31 MED ORDER — LIDOCAINE 2% (20 MG/ML) 5 ML SYRINGE
INTRAMUSCULAR | Status: AC
  Filled 2016-07-31: qty 5

## 2016-07-31 MED ORDER — ONDANSETRON 4 MG PO TBDP: 4.0000 mg | ORAL_TABLET | Freq: Four times a day (QID) | ORAL | Status: DC | PRN

## 2016-07-31 MED ORDER — ACETAMINOPHEN 10 MG/ML IV SOLN: 1000.0000 mg | Freq: Once | INTRAVENOUS | Status: DC

## 2016-07-31 MED ORDER — CEFAZOLIN SODIUM-DEXTROSE 2-4 GM/100ML-% IV SOLN
2.0000 g | Freq: Three times a day (TID) | INTRAVENOUS | Status: AC
  Administered 2016-07-31: 2 g via INTRAVENOUS
  Filled 2016-07-31: qty 100

## 2016-07-31 MED ORDER — EPHEDRINE SULFATE 50 MG/ML IJ SOLN
INTRAMUSCULAR | Status: DC | PRN
  Administered 2016-07-31 (×2): 5 mg via INTRAVENOUS
  Administered 2016-07-31: 10 mg via INTRAVENOUS

## 2016-07-31 MED ORDER — SODIUM CHLORIDE 0.9 % IJ SOLN
INTRAMUSCULAR | Status: DC | PRN
  Administered 2016-07-31: 10 mL via INTRAVENOUS

## 2016-07-31 MED ORDER — FENTANYL CITRATE (PF) 100 MCG/2ML IJ SOLN
INTRAMUSCULAR | Status: DC | PRN
  Administered 2016-07-31 (×3): 50 ug via INTRAVENOUS

## 2016-07-31 MED ORDER — MIDAZOLAM HCL 5 MG/5ML IJ SOLN
INTRAMUSCULAR | Status: DC | PRN
  Administered 2016-07-31: 2 mg via INTRAVENOUS

## 2016-07-31 MED ORDER — HEPARIN SODIUM (PORCINE) 5000 UNIT/ML IJ SOLN
5000.0000 [IU] | Freq: Once | INTRAMUSCULAR | Status: AC
  Administered 2016-07-31: 5000 [IU] via SUBCUTANEOUS
  Filled 2016-07-31: qty 1

## 2016-07-31 MED ORDER — ACETAMINOPHEN 10 MG/ML IV SOLN
INTRAVENOUS | Status: AC
  Filled 2016-07-31: qty 100

## 2016-07-31 MED ORDER — LIDOCAINE 2% (20 MG/ML) 5 ML SYRINGE
INTRAMUSCULAR | Status: DC | PRN
  Administered 2016-07-31: 80 mg via INTRAVENOUS

## 2016-07-31 MED ORDER — KCL IN DEXTROSE-NACL 20-5-0.45 MEQ/L-%-% IV SOLN
INTRAVENOUS | Status: DC
  Administered 2016-07-31: 19:00:00 via INTRAVENOUS
  Filled 2016-07-31: qty 1000

## 2016-07-31 MED ORDER — SUGAMMADEX SODIUM 200 MG/2ML IV SOLN
INTRAVENOUS | Status: AC
  Filled 2016-07-31: qty 2

## 2016-07-31 MED ORDER — ROCURONIUM BROMIDE 50 MG/5ML IV SOSY
PREFILLED_SYRINGE | INTRAVENOUS | Status: DC | PRN
Start: 2016-07-31 — End: 2016-07-31
  Administered 2016-07-31: 50 mg via INTRAVENOUS
  Administered 2016-07-31 (×6): 10 mg via INTRAVENOUS

## 2016-07-31 MED ORDER — CEFOTETAN DISODIUM-DEXTROSE 2-2.08 GM-% IV SOLR
2.0000 g | INTRAVENOUS | Status: AC
  Administered 2016-07-31: 2 g via INTRAVENOUS

## 2016-07-31 MED ORDER — MIDAZOLAM HCL 2 MG/2ML IJ SOLN
INTRAMUSCULAR | Status: AC
  Filled 2016-07-31: qty 2

## 2016-07-31 MED ORDER — 0.9 % SODIUM CHLORIDE (POUR BTL) OPTIME
TOPICAL | Status: DC | PRN
  Administered 2016-07-31: 1000 mL

## 2016-07-31 MED ORDER — FENTANYL CITRATE (PF) 250 MCG/5ML IJ SOLN
INTRAMUSCULAR | Status: AC
  Filled 2016-07-31: qty 5

## 2016-07-31 MED ORDER — HYDROMORPHONE HCL 1 MG/ML IJ SOLN
0.2500 mg | INTRAMUSCULAR | Status: DC | PRN
  Administered 2016-07-31 (×4): 0.5 mg via INTRAVENOUS

## 2016-07-31 MED ORDER — SUCCINYLCHOLINE CHLORIDE 200 MG/10ML IV SOSY
PREFILLED_SYRINGE | INTRAVENOUS | Status: AC
  Filled 2016-07-31: qty 10

## 2016-07-31 MED ORDER — MORPHINE SULFATE (PF) 2 MG/ML IV SOLN
1.0000 mg | INTRAVENOUS | Status: DC | PRN
  Administered 2016-07-31 – 2016-08-01 (×3): 1 mg via INTRAVENOUS
  Filled 2016-07-31 (×3): qty 1

## 2016-07-31 MED ORDER — ACETAMINOPHEN 500 MG PO TABS
1000.0000 mg | ORAL_TABLET | ORAL | Status: AC
  Administered 2016-07-31: 1000 mg via ORAL
  Filled 2016-07-31: qty 2

## 2016-07-31 MED ORDER — MEPERIDINE HCL 50 MG/ML IJ SOLN: 6.2500 mg | INTRAMUSCULAR | Status: DC | PRN

## 2016-07-31 MED ORDER — CELECOXIB 200 MG PO CAPS
400.0000 mg | ORAL_CAPSULE | ORAL | Status: AC
  Administered 2016-07-31: 400 mg via ORAL
  Filled 2016-07-31: qty 2

## 2016-07-31 MED ORDER — LACTATED RINGERS IV SOLN
INTRAVENOUS | Status: DC
  Administered 2016-07-31: 1000 mL via INTRAVENOUS
  Administered 2016-07-31: 13:00:00 via INTRAVENOUS

## 2016-07-31 MED ORDER — DEXAMETHASONE SODIUM PHOSPHATE 10 MG/ML IJ SOLN
INTRAMUSCULAR | Status: AC
  Filled 2016-07-31: qty 1

## 2016-07-31 MED ORDER — ONDANSETRON HCL 4 MG/2ML IJ SOLN
INTRAMUSCULAR | Status: AC
  Filled 2016-07-31: qty 2

## 2016-07-31 MED ORDER — SUCCINYLCHOLINE CHLORIDE 200 MG/10ML IV SOSY
PREFILLED_SYRINGE | INTRAVENOUS | Status: DC | PRN
  Administered 2016-07-31: 120 mg via INTRAVENOUS

## 2016-07-31 MED ORDER — DORZOLAMIDE HCL-TIMOLOL MAL 2-0.5 % OP SOLN
1.0000 [drp] | Freq: Two times a day (BID) | OPHTHALMIC | Status: DC
  Administered 2016-07-31: 1 [drp] via OPHTHALMIC
  Filled 2016-07-31: qty 10

## 2016-07-31 MED ORDER — PROMETHAZINE HCL 25 MG/ML IJ SOLN: 6.2500 mg | INTRAMUSCULAR | Status: DC | PRN

## 2016-07-31 SURGICAL SUPPLY — 41 items
APPLICATOR COTTON TIP 6IN STRL (MISCELLANEOUS) ×4 IMPLANT
BLADE SURG 15 STRL LF DISP TIS (BLADE) ×1 IMPLANT
BLADE SURG 15 STRL SS (BLADE) ×3
CHLORAPREP W/TINT 26ML (MISCELLANEOUS) ×3 IMPLANT
COVER TIP SHEARS 8 DVNC (MISCELLANEOUS) ×1 IMPLANT
COVER TIP SHEARS 8MM DA VINCI (MISCELLANEOUS) ×2
DECANTER SPIKE VIAL GLASS SM (MISCELLANEOUS) ×3 IMPLANT
DRAPE ARM DVNC X/XI (DISPOSABLE) ×4 IMPLANT
DRAPE COLUMN DVNC XI (DISPOSABLE) ×1 IMPLANT
DRAPE DA VINCI XI ARM (DISPOSABLE) ×8
DRAPE DA VINCI XI COLUMN (DISPOSABLE) ×2
ELECT REM PT RETURN 15FT ADLT (MISCELLANEOUS) ×3 IMPLANT
GLOVE BIOGEL M 8.0 STRL (GLOVE) ×6 IMPLANT
GOWN STRL REUS W/TWL XL LVL3 (GOWN DISPOSABLE) ×11 IMPLANT
IRRIG SUCT STRYKERFLOW 2 WTIP (MISCELLANEOUS)
IRRIGATION SUCT STRKRFLW 2 WTP (MISCELLANEOUS) ×1 IMPLANT
KIT BASIN OR (CUSTOM PROCEDURE TRAY) ×3 IMPLANT
MESH 3DMAX LIGHT 4.8X6.7 LT XL (Mesh General) ×2 IMPLANT
MESH 3DMAX LIGHT 4.8X6.7 RT XL (Mesh General) ×2 IMPLANT
NEEDLE HYPO 22GX1.5 SAFETY (NEEDLE) ×3 IMPLANT
PACK CARDIOVASCULAR III (CUSTOM PROCEDURE TRAY) ×3 IMPLANT
PAD POSITIONING PINK XL (MISCELLANEOUS) ×3 IMPLANT
SCISSORS LAP 5X35 DISP (ENDOMECHANICALS) ×3 IMPLANT
SEAL CANN UNIV 5-8 DVNC XI (MISCELLANEOUS) ×3 IMPLANT
SEAL XI 5MM-8MM UNIVERSAL (MISCELLANEOUS) ×6
SET BI-LUMEN FLTR TB AIRSEAL (TUBING) ×3 IMPLANT
SET IRRIG TUBING LAPAROSCOPIC (IRRIGATION / IRRIGATOR) ×2 IMPLANT
SOLUTION ANTI FOG 6CC (MISCELLANEOUS) ×3 IMPLANT
SOLUTION ELECTROLUBE (MISCELLANEOUS) ×3 IMPLANT
SPONGE LAP 18X18 X RAY DECT (DISPOSABLE) ×3 IMPLANT
STAPLER VISISTAT 35W (STAPLE) IMPLANT
SUT VIC AB 3-0 SH 27 (SUTURE)
SUT VIC AB 3-0 SH 27XBRD (SUTURE) IMPLANT
SUT VIC AB 4-0 SH 18 (SUTURE) ×1 IMPLANT
SUT VLOC 180 2-0 9IN GS21 (SUTURE) ×4 IMPLANT
SYR 10ML ECCENTRIC (SYRINGE) ×3 IMPLANT
SYR 20CC LL (SYRINGE) ×3 IMPLANT
TOWEL OR 17X26 10 PK STRL BLUE (TOWEL DISPOSABLE) ×3 IMPLANT
TOWEL OR NON WOVEN STRL DISP B (DISPOSABLE) ×3 IMPLANT
TROCAR ADV FIXATION 12X100MM (TROCAR) ×2 IMPLANT
TROCAR XCEL NON-BLD 5MMX100MML (ENDOMECHANICALS) ×3 IMPLANT

## 2016-07-31 NOTE — Transfer of Care (Signed)
Immediate Anesthesia Transfer of Care Note  Patient: [name]  Procedure(s) Performed: Procedure(s): XI ROBOTIC BILATERAL INGUINAL HERNIA REPAIR WITH MESH (Bilateral) INSERTION OF MESH (Bilateral)  Patient Location: PACU  Anesthesia Type:General  Level of Consciousness:  sedated, patient cooperative and responds to stimulation  Airway & Oxygen Therapy:Patient Spontanous Breathing and Patient connected to face mask oxgen  Post-op Assessment:  Report given to PACU RN and Post -op Vital signs reviewed and stable  Post vital signs:  Reviewed and stable  Last Vitals:  Vitals:   07/31/16 0902 07/31/16 1512  BP: (!) 127/91   Pulse: 60   Resp: 16   Temp: 37.1 C (P) 37 C    Complications: No apparent anesthesia complications

## 2016-07-31 NOTE — Anesthesia Procedure Notes (Signed)
Procedure Name: Intubation Date/Time: 07/31/2016 11:05 AM Performed by: Dione Booze Pre-anesthesia Checklist: Emergency Drugs available, Suction available, Patient being monitored and Patient identified Patient Re-evaluated:Patient Re-evaluated prior to inductionOxygen Delivery Method: Circle system utilized Preoxygenation: Pre-oxygenation with 100% oxygen Intubation Type: IV induction Laryngoscope Size: Glidescope and 4 Grade View: Grade II Tube type: Oral Tube size: 7.5 mm Number of attempts: 1 Airway Equipment and Method: Stylet and Video-laryngoscopy Placement Confirmation: ETT inserted through vocal cords under direct vision,  positive ETCO2 and breath sounds checked- equal and bilateral Secured at: 22 cm Tube secured with: Tape Dental Injury: Teeth and Oropharynx as per pre-operative assessment  Comments: Elective glidescope, limited neck mobility, small mouth opening.

## 2016-07-31 NOTE — H&P (Addendum)
[name]. [name] 06/03/2016 1:30 PM Location: [facility] Patient #: [id number] DOB: 09/11/1950 Single / Language: [name] / Race: White Male   History of Present Illness [name] Done MD; 06/03/2016 2:18 PM) Patient words: bilateral inguinal herniae L>R [name] is a 65 year  old patient of mine on whom I performed a Lap Nissen in 1995, a lap chole in 2007 and a lap appy even earliers. He has been having lower abdominal pain assosicated with bilateral inguinal herniae.  He has been seen by his primary care, Dr. Jenna Luo mainly for left inguinal pain.  He has a history of C dif about a year ago and has had a colonoscopy within a year. His Nissen has not bothered him but he does get bloated and has been having abdominal pain. He has had diarrhea and nausea a lot lately. CT scan performed showed:  1. New herniation of a short segment of sigmoid colon into a moderate left inguinal hernia. No proximal obstruction or significant inflammatory changes of the bowel. Stable moderate right inguinal hernia containing fat. 2. Postsurgical changes related to fundoplication, cholecystectomy, and appendectomy. No hiatal hernia. 3. Aortic atherosclerosis.     Other Problems Shirlean Mylar Gwynn, RMA; 06/03/2016 1:30 PM) Arthritis  Back Pain  Cancer  Depression  Enlarged Prostate  Gastroesophageal Reflux Disease  Hemorrhoids  Inguinal Hernia  Kidney Stone  Pulmonary Embolism / Blood Clot in Legs  Sleep Apnea  Transfusion history  Ventral Hernia Repair   Past Surgical History Yehuda Mao, RMA; 06/03/2016 1:30 PM) Appendectomy  Colon Polyp Removal - Colonoscopy  Gallbladder Surgery - Laparoscopic  Knee Surgery  Bilateral. Nissen Fundoplication  Spinal Surgery - Lower Back  Spinal Surgery - Neck  Thyroid Surgery  Tonsillectomy  Vasectomy   Diagnostic Studies History Yehuda Mao, RMA; 06/03/2016 1:30 PM) Colonoscopy  1-5 years ago  Allergies  Yehuda Mao, RMA; 06/03/2016 1:41 PM) Latex Gloves *MEDICAL DEVICES AND SUPPLIES*   Medication History Shirlean Mylar Gwynn, RMA; 06/03/2016 1:44 PM) Aspirin (81MG Tablet DR, Oral) Active. Calcium (500MG Tablet, Oral) Active. FentaNYL (25MCG/HR Patch 72HR, Q 3 days Transdermal) Active. Fish Oil (1000MG Capsule, Oral) Active. Fluticasone Propionate (50MCG/ACT Suspension, Nasal) Active. Multi Vitamin (Oral) Active. TraZODone HCl (50MG Tablet, Oral) Active. Trintellix (10MG Tablet, Oral) Active. Medications Reconciled  Social History Yehuda Mao, RMA; 06/03/2016 1:30 PM) Alcohol use  Moderate alcohol use. Caffeine use  Carbonated beverages, Coffee, Tea. No drug use  Tobacco use  Former smoker.  Family History Yehuda Mao, Gage; 06/03/2016 1:30 PM) Arthritis  Mother. Colon Cancer  Father. Diabetes Mellitus  Father, Sister. Hypertension  Mother. Prostate Cancer  Father.    Review of Systems Shirlean Mylar Gwynn RMA; 06/03/2016 1:30 PM) General Not Present- Appetite Loss, Chills, Fatigue, Fever, Night Sweats, Weight Gain and Weight Loss. Skin Not Present- Change in Wart/Mole, Dryness, Hives, Jaundice, New Lesions, Non-Healing Wounds, Rash and Ulcer. HEENT Present- Hearing Loss, Seasonal Allergies and Wears glasses/contact lenses. Not Present- Earache, Hoarseness, Nose Bleed, Oral Ulcers, Ringing in the Ears, Sinus Pain, Sore Throat, Visual Disturbances and Yellow Eyes. Respiratory Not Present- Bloody sputum, Chronic Cough, Difficulty Breathing, Snoring and Wheezing. Breast Not Present- Breast Mass, Breast Pain, Nipple Discharge and Skin Changes. Cardiovascular Present- Leg Cramps. Not Present- Chest Pain, Difficulty Breathing Lying Down, Palpitations, Rapid Heart Rate, Shortness of Breath and Swelling of Extremities. Gastrointestinal Present- Abdominal Pain, Bloating, Chronic diarrhea, Excessive gas, Hemorrhoids and Nausea. Not Present- Bloody Stool, Change in Bowel Habits,  Constipation, Difficulty Swallowing, Gets full quickly at  meals, Indigestion, Rectal Pain and Vomiting. Male Genitourinary Present- Urine Leakage. Not Present- Blood in Urine, Change in Urinary Stream, Frequency, Impotence, Nocturia, Painful Urination and Urgency. Musculoskeletal Present- Back Pain and Joint Pain. Not Present- Joint Stiffness, Muscle Pain, Muscle Weakness and Swelling of Extremities. Neurological Present- Headaches. Not Present- Decreased Memory, Fainting, Numbness, Seizures, Tingling, Tremor, Trouble walking and Weakness. Psychiatric Present- Depression. Not Present- Anxiety, Bipolar, Change in Sleep Pattern, Fearful and Frequent crying. Endocrine Not Present- Cold Intolerance, Excessive Hunger, Hair Changes, Heat Intolerance, Hot flashes and New Diabetes. Hematology Present- Blood Thinners. Not Present- Easy Bruising, Excessive bleeding, Gland problems, HIV and Persistent Infections.  .  Vitals (Robin Gwynn RMA; 06/03/2016 1:44 PM) 06/03/2016 1:44 PM Weight: 223 lb Height: 67in Body Surface Area: 2.12 m Body Mass Index: 34.93 kg/m  Pulse: 58 (Regular)  BP: 136/84 (Sitting, Left Arm, Standard)       Physical Exam (Aala Ransom B. [name] Done MD; 06/03/2016 2:19 PM) General Note: HEENT glasses Neck supple Chest clear Heart SR Abdomen protuberant and obese with bilateral inguinal herniae L>R Ext FROM Neuro alert and orient ed x 3     Assessment & Plan [name] Done MD; 06/03/2016 2:21 PM) BILATERAL INGUINAL HERNIA (K40.20) Impression: I have discussed robotic TAPP procedure with him. He wants to proceed with repairs. He understands risks. Before then , I want to get CT abdomen and pelvis and a stool for C dif (prior hx). He his having abdominal pain, bloating in addition to his diarrhea. OSA (OBSTRUCTIVE SLEEP APNEA) (G47.33)

## 2016-07-31 NOTE — Anesthesia Postprocedure Evaluation (Signed)
Anesthesia Post Note  Patient: [name]  Procedure(s) Performed: Procedure(s) (LRB): XI ROBOTIC BILATERAL INGUINAL HERNIA REPAIR WITH MESH (Bilateral) INSERTION OF MESH (Bilateral)  Patient location during evaluation: PACU Anesthesia Type: General Level of consciousness: sedated and patient cooperative Pain management: pain level controlled Vital Signs Assessment: post-procedure vital signs reviewed and stable Respiratory status: spontaneous breathing Cardiovascular status: stable Anesthetic complications: no       Last Vitals:  Vitals:   07/31/16 1623 07/31/16 1651  BP: (!) 139/92 127/75  Pulse: 84 89  Resp: 15 16  Temp: 36.9 C 36.6 C    Last Pain:  Vitals:   07/31/16 1651  TempSrc: Oral  PainSc:                  Nolon Nations

## 2016-07-31 NOTE — Progress Notes (Signed)
Pt is on NIV at this time tolerating it well. Pt states that he wear auto settings at home ranging from 16-5 IPAP-EPAP max-min. Settings were set in the machine per patient request and home regimen. Pt states that it feels fine. No distress noted at this time.

## 2016-07-31 NOTE — Brief Op Note (Signed)
07/31/2016  3:27 PM  PATIENT:  [name]  65 y.o. male  PRE-OPERATIVE DIAGNOSIS:  bilateral inguinal hernia  POST-OPERATIVE DIAGNOSIS:  bilateral inguinal hernia  PROCEDURE:  Procedure(s): XI ROBOTIC BILATERAL INGUINAL HERNIA REPAIR WITH MESH (Bilateral) INSERTION OF MESH (Bilateral)  SURGEON:  Surgeon(s) and Role:    * [name], MD - Primary    * Michael Boston, MD - Assisting  PHYSICIAN ASSISTANT:   ASSISTANTS: Michael Boston, MD,    ANESTHESIA:   general  EBL:  Total I/O In: 1500 [I.V.:1500] Out: 250 [Urine:200; Blood:50]  BLOOD ADMINISTERED:none  DRAINS: none   LOCAL MEDICATIONS USED:  BUPIVICAINE   SPECIMEN:  No Specimen  DISPOSITION OF SPECIMEN:  N/A  COUNTS:  YES  TOURNIQUET:  * No tourniquets in log *  DICTATION: .Other Dictation: Dictation Number [id number]  PLAN OF CARE: Admit for overnight observation  PATIENT DISPOSITION:  PACU - hemodynamically stable.   Delay start of Pharmacological VTE agent (>24hrs) due to surgical blood loss or risk of bleeding: no

## 2016-07-31 NOTE — Anesthesia Preprocedure Evaluation (Signed)
Anesthesia Evaluation  Patient identified by MRN, date of birth, ID band Patient awake    Reviewed: Allergy & Precautions, H&P , NPO status , Patient's Chart, lab work & pertinent test results  History of Anesthesia Complications (+) history of anesthetic complications  Airway Mallampati: II  TM Distance: >3 FB Neck ROM: Full    Dental no notable dental hx. (+) Teeth Intact, Dental Advisory Given   Pulmonary sleep apnea and Continuous Positive Airway Pressure Ventilation , former smoker,    Pulmonary exam normal breath sounds clear to auscultation       Cardiovascular negative cardio ROS   Rhythm:Regular Rate:Normal     Neuro/Psych negative neurological ROS  negative psych ROS   GI/Hepatic Neg liver ROS, hiatal hernia, PUD, GERD  Medicated and Controlled,  Endo/Other  negative endocrine ROS  Renal/GU negative Renal ROS     Musculoskeletal  (+) Arthritis ,   Abdominal   Peds  Hematology negative hematology ROS (+)   Anesthesia Other Findings   Reproductive/Obstetrics negative OB ROS                             Anesthesia Physical  Anesthesia Plan  ASA: III  Anesthesia Plan: General   Post-op Pain Management:    Induction: Intravenous  Airway Management Planned: Oral ETT  Additional Equipment:   Intra-op Plan:   Post-operative Plan: Extubation in OR  Informed Consent: I have reviewed the patients History and Physical, chart, labs and discussed the procedure including the risks, benefits and alternatives for the proposed anesthesia with the patient or authorized representative who has indicated his/her understanding and acceptance.   Dental advisory given  Plan Discussed with: CRNA  Anesthesia Plan Comments:         Anesthesia Quick Evaluation

## 2016-08-01 DIAGNOSIS — Z7982 Long term (current) use of aspirin: Secondary | ICD-10-CM | POA: Diagnosis not present

## 2016-08-01 DIAGNOSIS — G4733 Obstructive sleep apnea (adult) (pediatric): Secondary | ICD-10-CM | POA: Diagnosis not present

## 2016-08-01 DIAGNOSIS — Z79899 Other long term (current) drug therapy: Secondary | ICD-10-CM | POA: Diagnosis not present

## 2016-08-01 DIAGNOSIS — Z7951 Long term (current) use of inhaled steroids: Secondary | ICD-10-CM | POA: Diagnosis not present

## 2016-08-01 DIAGNOSIS — K4020 Bilateral inguinal hernia, without obstruction or gangrene, not specified as recurrent: Secondary | ICD-10-CM | POA: Diagnosis not present

## 2016-08-01 DIAGNOSIS — F329 Major depressive disorder, single episode, unspecified: Secondary | ICD-10-CM | POA: Diagnosis not present

## 2016-08-01 LAB — CBC
HEMATOCRIT: 38.6 % — AB (ref 39.0–52.0)
Hemoglobin: 13.4 g/dL (ref 13.0–17.0)
MCH: 31 pg (ref 26.0–34.0)
MCHC: 34.7 g/dL (ref 30.0–36.0)
MCV: 89.4 fL (ref 78.0–100.0)
PLATELETS: 178 10*3/uL (ref 150–400)
RBC: 4.32 MIL/uL (ref 4.22–5.81)
RDW: 12.3 % (ref 11.5–15.5)
WBC: 12.2 10*3/uL — AB (ref 4.0–10.5)

## 2016-08-01 MED ORDER — OXYCODONE HCL 5 MG PO TABS: 5.0000 mg | ORAL_TABLET | Freq: Four times a day (QID) | ORAL | 0 refills | Status: DC | PRN

## 2016-08-01 NOTE — Discharge Summary (Signed)
Physician Discharge Summary  Patient ID: [name] MRN: [id number] DOB/AGE: [birth year] 65 y.o.  Admit date: 07/31/2016 Discharge date: 08/01/2016  Admission Diagnoses:  Bilateral inguinal herniae  Discharge Diagnoses:  same  Principal Problem:   Robotic XI TAPP  bilateral inguinal hernia repair Dec 2017 Active Problems:   S/P bilateral inguinal herniorrhaphy   Surgery:  Robotic TAPP for bilateral inguinal herniae  Discharged Condition: improved  Hospital Course:   Had robotic TAPP hernia repair with 3D Max mesh bilaterally for bilateral pantaloon herniae with large indirect on the left that contained sigmoid colon.  [place] overnight and ready for discharge on Saturday morning.  -Consults: none  Significant Diagnostic Studies: none    Discharge Exam: Blood pressure (!) 106/57, pulse 77, temperature 98.1 F (36.7 C), temperature source Oral, resp. rate 16, height 5' 7"  (1.702 m), weight 103.9 kg (229 lb), SpO2 96 %. Incisions bland and covered  Disposition: 01-Home or Self Care  Discharge Instructions    Call MD for:  persistant nausea and vomiting    Complete by:  As directed    Call MD for:  severe uncontrolled pain    Complete by:  As directed    Diet - low sodium heart healthy    Complete by:  As directed    Discharge instructions    Complete by:  As directed    Staple removal in the office by nurses in 10 days   Increase activity slowly    Complete by:  As directed      Allergies as of 08/01/2016      Reactions   Other    "Adhesives"Pt allergic to some tapes--pt request that we only use paper tape!!      Medication List    TAKE these medications   aspirin 81 MG tablet Take 81 mg by mouth daily.   CALCIUM + D PO Take 1 tablet by mouth daily.   dorzolamide-timolol 22.3-6.8 MG/ML ophthalmic solution Commonly known as:  COSOPT Place 1 drop into both eyes 2 (two) times daily.   fentaNYL 25 MCG/HR patch Commonly known as:  Convoy - dosed  mcg/hr Place 1 patch onto the skin every 3 (three) days. For chronic back pain   Fish Oil 1200 MG Caps Take 1 capsule by mouth daily.   fluticasone 50 MCG/ACT nasal spray Commonly known as:  FLONASE Place 1 spray into the nose every evening.   multivitamin with minerals Tabs tablet Take 1 tablet by mouth daily.   oxyCODONE 5 MG immediate release tablet Commonly known as:  Oxy IR/ROXICODONE Take 1-2 tablets (5-10 mg total) by mouth every 6 (six) hours as needed for severe pain.   traZODone 50 MG tablet Commonly known as:  DESYREL Take 100 mg by mouth at bedtime as needed for sleep.   vortioxetine HBr 10 MG Tabs Commonly known as:  TRINTELLIX Take 1 tablet (10 mg total) by mouth daily.      Follow-up Information    Pedro Earls, MD Follow up.   Specialty:  General Surgery Contact information: [address] [place] [address] [phone]           Signed: Pedro Earls 08/01/2016, 7:56 AM

## 2016-08-01 NOTE — Discharge Instructions (Signed)
May shower when you get home Staple removal in 10 days No heavy lifting for 6 weeks

## 2016-08-01 NOTE — Progress Notes (Signed)
Patient discharged to home with family, discharge instructions reviewed with patient who verbalized understanding. New RX given to patient.

## 2016-08-04 NOTE — Op Note (Signed)
NAME:  [name], [name] NO.:  [id number]  MEDICAL RECORD NO.:  [id number]  LOCATION:                                FACILITY:  [facility]  PHYSICIAN:  Isabel Caprice. Hassell Done, MD  DATE OF BIRTH:  Feb 10, 1951  DATE OF PROCEDURE:  07/31/2016 DATE OF DISCHARGE:  08/01/2016                              OPERATIVE REPORT   PREOPERATIVE DIAGNOSIS:  Bilateral inguinal hernias.  POSTOPERATIVE DIAGNOSIS:  Bilateral pantaloon hernias, left much bigger than the right.  PROCEDURE:  Robotic Xi TAPP procedure using extra-large Light 3DMax mesh.  ASSISTANT:  Adin Hector, MD.  ANESTHESIA:  General endotracheal.  DESCRIPTION OF PROCEDURE:  The patient was taken to room 1 and given general anesthesia.  He was clipped and then prepped with chlorhexidine and draped sterilely.  Abdomen was entered through the left upper quadrant with a 5-mm Optiview after the appropriate time-out and following insufflation, there was 1 little adhesion from his previous lap Nissen on the patient's left side.  With the scope in place, I placed 3 ports for the robot, 8 mm ports with one about 3 fingerbreadths above the umbilicus in the middle for the camera and then 2 lateral ports.  In addition, there was another port placed in the right upper quadrant for an assistant port.  These enabled [place] to then put him in Trendelenburg, docked the [employer].  I began by using the monopolar scissors in the right hand and scored the peritoneum above the anterior superior iliac spine to the midline.  I took these down bilaterally and then dissected in a caudal direction toward the pubis.  I went across the midline and then took all this down.  He had a lot of preperitoneal fat, which obscured things.  The dissection was therefore a little more tedious, but he had a very large indirect sac on the left side and a direct sac.  On the right side, I dissected out a large direct sac and really a small fatty lipoma of the cord  in the right indirect position. Following this, we placed 3DMax mesh Light extra-large for the left side, secured in the midline, and then tacked it laterally with the SecureStrap, and then overlapped with a piece on the right side similarly tacked with a SecureStrap.  The peritoneum was then closed from right to left with a running V-Loc suture 2-0 barb.  Everything looked appeared to be in order at the end.  Sponge and needle counts were correct.  Everything appeared to be in order in the abdominal cavity and we then deflated, injected some Exparel into the incisions, and closed with 4-0 Monocryl and with staples, because he is allergic to adhesives.  The patient tolerated the procedure well and was taken to the recovery room in satisfactory condition.     Isabel Caprice Hassell Done, MD     MBM/MEDQ  D:  07/31/2016  T:  08/01/2016  Job:  [id number]  cc:   Marrianne Mood, MD Fax: [phone]

## 2016-08-12 DIAGNOSIS — H401131 Primary open-angle glaucoma, bilateral, mild stage: Secondary | ICD-10-CM | POA: Diagnosis not present

## 2016-09-28 DIAGNOSIS — M5126 Other intervertebral disc displacement, lumbar region: Secondary | ICD-10-CM | POA: Diagnosis not present

## 2016-09-28 DIAGNOSIS — G894 Chronic pain syndrome: Secondary | ICD-10-CM | POA: Diagnosis not present

## 2016-09-28 DIAGNOSIS — Z79891 Long term (current) use of opiate analgesic: Secondary | ICD-10-CM | POA: Diagnosis not present

## 2016-09-28 DIAGNOSIS — M961 Postlaminectomy syndrome, not elsewhere classified: Secondary | ICD-10-CM | POA: Diagnosis not present

## 2016-10-15 DIAGNOSIS — R2232 Localized swelling, mass and lump, left upper limb: Secondary | ICD-10-CM | POA: Diagnosis not present

## 2016-10-21 DIAGNOSIS — M25511 Pain in right shoulder: Secondary | ICD-10-CM | POA: Diagnosis not present

## 2016-10-21 DIAGNOSIS — M25512 Pain in left shoulder: Secondary | ICD-10-CM | POA: Diagnosis not present

## 2016-10-21 DIAGNOSIS — G8929 Other chronic pain: Secondary | ICD-10-CM | POA: Diagnosis not present

## 2016-10-28 ENCOUNTER — Other Ambulatory Visit: Payer: Self-pay | Admitting: Orthopedic Surgery

## 2016-10-28 DIAGNOSIS — D481 Neoplasm of uncertain behavior of connective and other soft tissue: Secondary | ICD-10-CM | POA: Diagnosis not present

## 2016-10-28 DIAGNOSIS — B078 Other viral warts: Secondary | ICD-10-CM | POA: Diagnosis not present

## 2016-10-28 DIAGNOSIS — S61303A Unspecified open wound of left middle finger with damage to nail, initial encounter: Secondary | ICD-10-CM | POA: Diagnosis not present

## 2016-11-02 ENCOUNTER — Ambulatory Visit (INDEPENDENT_AMBULATORY_CARE_PROVIDER_SITE_OTHER): Payer: Medicare Other | Admitting: Family Medicine

## 2016-11-02 ENCOUNTER — Encounter: Payer: Self-pay | Admitting: Family Medicine

## 2016-11-02 VITALS — BP 140/90 | HR 78 | Temp 97.8°F | Resp 18 | Ht 67.0 in | Wt 230.0 lb

## 2016-11-02 DIAGNOSIS — I10 Essential (primary) hypertension: Secondary | ICD-10-CM

## 2016-11-02 LAB — COMPLETE METABOLIC PANEL WITH GFR
ALBUMIN: 4.5 g/dL (ref 3.6–5.1)
ALK PHOS: 75 U/L (ref 40–115)
ALT: 33 U/L (ref 9–46)
AST: 22 U/L (ref 10–35)
BUN: 18 mg/dL (ref 7–25)
CO2: 25 mmol/L (ref 20–31)
Calcium: 9.6 mg/dL (ref 8.6–10.3)
Chloride: 104 mmol/L (ref 98–110)
Creat: 0.98 mg/dL (ref 0.70–1.25)
GFR, EST NON AFRICAN AMERICAN: 79 mL/min (ref >=60)
GFR, Est African American: >89 mL/min (ref >=60)
GLUCOSE: 96 mg/dL (ref 70–99)
POTASSIUM: 4.6 mmol/L (ref 3.5–5.3)
SODIUM: 141 mmol/L (ref 135–146)
TOTAL PROTEIN: 7.2 g/dL (ref 6.1–8.1)
Total Bilirubin: 0.6 mg/dL (ref 0.2–1.2)

## 2016-11-02 LAB — CBC WITH DIFFERENTIAL/PLATELET
BASOS PCT: 0 %
Basophils Absolute: 0 cells/uL (ref 0–200)
EOS PCT: 1 %
Eosinophils Absolute: 101 cells/uL (ref 15–500)
HCT: 48.2 % (ref 38.5–50.0)
Hemoglobin: 16.7 g/dL (ref 13.0–17.0)
LYMPHS PCT: 22 %
Lymphs Abs: 2222 cells/uL (ref 850–3900)
MCH: 32.1 pg (ref 27.0–33.0)
MCHC: 34.6 g/dL (ref 32.0–36.0)
MCV: 92.7 fL (ref 80.0–100.0)
MPV: 9.6 fL (ref 7.5–12.5)
Monocytes Absolute: 808 cells/uL (ref 200–950)
Monocytes Relative: 8 %
NEUTROS ABS: 6969 {cells}/uL (ref 1500–7800)
Neutrophils Relative %: 69 %
Platelets: 205 10*3/uL (ref 140–400)
RBC: 5.2 MIL/uL (ref 4.20–5.80)
RDW: 14 % (ref 11.0–15.0)
WBC: 10.1 10*3/uL (ref 3.8–10.8)

## 2016-11-02 MED ORDER — LISINOPRIL 20 MG PO TABS: 20.0000 mg | ORAL_TABLET | Freq: Every day | ORAL | 3 refills | Status: DC

## 2016-11-02 NOTE — Progress Notes (Signed)
Subjective:    Patient ID: [name], male    DOB: 10-31-1950, 66 y.o.   MRN: [id number]  HPI  The patient Dates his blood pressure is been running high recently. On 2 separate occasions, and his orthopedist office, his systolic blood pressure has been as high as 180. He is checking his blood pressure at home and finding his blood pressure ranged between 010 and 071 systolic. I checked it twice here. My nurse got 140/90. I got 150/90. Therefore I believe is more likely that his blood pressure is averaging between 219 and 758 systolic. Aside from his elevated blood pressure, he also reports occasional episodes of dizziness. He states that he will be doing nothing, he will break out in the cool sweat and feel very dizzy and lightheaded. Symptoms last a few seconds and then resolve spontaneously. There are no alleviating factors. There are no exacerbating factors. He denies any angina or shortness of breath or paroxysmal nocturnal dyspnea. He denies any pitting edema. He denies any palpitations or tachycardia. He denies any relationship to food or not eating or possible hypoglycemia. Past Medical History:  Diagnosis Date  . Arthritis    oa and ddd  . Back pain, chronic    pt states he has 3 herniated disks--uses fentanyl patch for pain  . Blood transfusion without reported diagnosis 1950   rH negative"at birth only"  . BPH (benign prostatic hyperplasia)    frequent urination, not able to hold urine well  . 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 Colon polyps   . Complication of anesthesia    pt had spinal for a knee replacement--"woke up" during surgery--and sick after surgery  . Depression   . Diverticulosis   . DVT (deep venous thrombosis) ([place]) 2001 or 2002   right leg-required hospitalization x 8 days  . DVT (deep venous thrombosis) ([place]) 1996   right  . GERD (gastroesophageal reflux disease)   . Glaucoma    both eyes  . H/O hiatal hernia   . Hearing impaired  person, bilateral    bilateral  . Hemorrhoid   . History of IBS   . History of kidney stones    x 1-passed  . Hyperlipidemia 2012  . Lumbago   . Lumbar post-laminectomy syndrome   . Sleep apnea    pt uses cpap - setting of 16  . Ulcer ([place]) 1966  . Ulcerative colitis ([place])   . Wears glasses   . Wears hearing aid    Past Surgical History:  Procedure Laterality Date  . APPENDECTOMY  1993  . BACK SURGERY  2008    bone spur removed   . bilateral carpal tunnel release 2009    . cervical fusion 2011--pt has good range of motion neck    . CHOLECYSTECTOMY  1994  . INSERTION OF MESH Bilateral 07/31/2016   Procedure: INSERTION OF MESH;  Surgeon: [name], MD;  [facility];  Service: General;  Laterality: Bilateral;  . JOINT REPLACEMENT Bilateral    2001 left total knee and 1985 right total knee  . MASS EXCISION Left 01/04/2014   Procedure: LEFT MIDDLE FINGER MASS EXCISION WITH NAILBED REPAIR AND ADVANCEMENT FLAP;  Surgeon: [name], MD;  Location: [place];  Service: Orthopedics;  Laterality: Left;  . mass removed from 2nd finger Left aug 2014   squamous carcinoma  . nissen fundoplication [id number]    . PROSTATE SURGERY  2014   TURP  .  right wrist fusion 12/12 - pt wearing brace on the wrist-not started physical therapy yet Right    fusion prevent limited ROM to right wrist  . SPINE SURGERY  2009   cervical spine fusion  . TOTAL KNEE REVISION Right 11/17/2013   Procedure: RIGHT KNEE POLYETHYLENE REVISION, bone graft;  Surgeon: [name], MD;  [facility];  Service: Orthopedics;  Laterality: Right;  . TRANSURETHRAL RESECTION OF PROSTATE  10/09/2011   Procedure: TRANSURETHRAL RESECTION OF THE PROSTATE WITH GYRUS INSTRUMENTS;  Surgeon: Fredricka Bonine, MD;  [facility];  Service: Urology;  Laterality: N/A;  . uvvvp    . XI ROBOTIC ASSISTED INGUINAL HERNIA REPAIR WITH MESH Bilateral 07/31/2016   Procedure: XI ROBOTIC BILATERAL INGUINAL  HERNIA REPAIR WITH MESH;  Surgeon: [name], MD;  [facility];  Service: General;  Laterality: Bilateral;   Current Outpatient Prescriptions on File Prior to Visit  Medication Sig Dispense Refill  . aspirin 81 MG tablet Take 81 mg by mouth daily.    . Calcium Carbonate-Vitamin D (CALCIUM + D PO) Take 1 tablet by mouth daily.    . dorzolamide-timolol (COSOPT) 22.3-6.8 MG/ML ophthalmic solution Place 1 drop into both eyes 2 (two) times daily.    . fentaNYL (DURAGESIC - DOSED MCG/HR) 25 MCG/HR Place 1 patch onto the skin every 3 (three) days. For chronic back pain    . fluticasone (FLONASE) 50 MCG/ACT nasal spray Place 1 spray into the nose every evening.     . Multiple Vitamin (MULTIVITAMIN WITH MINERALS) TABS tablet Take 1 tablet by mouth daily.    . Omega-3 Fatty Acids (FISH OIL) 1200 MG CAPS Take 1 capsule by mouth daily.    Marland Kitchen oxyCODONE (OXY IR/ROXICODONE) 5 MG immediate release tablet Take 1-2 tablets (5-10 mg total) by mouth every 6 (six) hours as needed for severe pain. 30 tablet 0  . traZODone (DESYREL) 50 MG tablet Take 100 mg by mouth at bedtime as needed for sleep.     Marland Kitchen vortioxetine HBr (TRINTELLIX) 10 MG TABS Take 1 tablet (10 mg total) by mouth daily. 14 tablet 0   No current facility-administered medications on file prior to visit.    Allergies  Allergen Reactions  . Other     "Adhesives"Pt allergic to some tapes--pt request that we only use paper tape!!   Social History   Social History  . Marital status: Single    Spouse name: N/A  . Number of children: N/A  . Years of education: N/A   Occupational History  . disabled    Social History Main Topics  . Smoking status: Former Smoker    Packs/day: 1.00    Years: 19.00    Types: Cigarettes    Quit date: 08/04/1983  . Smokeless tobacco: Never Used     Comment: quit smoking 1985  . Alcohol use 3.6 oz/week    6 Cans of beer per week     Comment: maybe 2 or 3 beers per week  . Drug use: No  . Sexual  activity: Not Currently   Other Topics Concern  . Not on file   Social History Narrative  . No narrative on file     Review of Systems  All other systems reviewed and are negative.      Objective:   Physical Exam  Neck: Neck supple. No JVD present. No thyromegaly present.  Cardiovascular: Normal rate, regular rhythm and normal heart sounds.   Pulmonary/Chest: Effort normal and breath sounds  normal. No respiratory distress. He has no wheezes. He has no rales. He exhibits no tenderness.  Abdominal: Soft. Bowel sounds are normal. He exhibits no distension and no mass. There is no tenderness. There is no rebound and no guarding.  Musculoskeletal: He exhibits no edema.  Lymphadenopathy:    He has no cervical adenopathy.  Vitals reviewed.         Assessment & Plan:  Benign essential HTN - Plan: CBC with Differential/Platelet, COMPLETE METABOLIC PANEL WITH GFR, TSH  Blood pressure is elevated. Begin lisinopril 20 mg by mouth daily and recheck blood pressure in 2 weeks. I will also check a CBC, CMP, as well as a TSH given his disequilibrium and lightheadedness. I suspect the patient may be having vasovagal episodes possibly related to anxiety. I will evaluate for diabetes, anemia, or hyperthyroidism using this lab work. Recheck in 2 weeks or sooner if worse

## 2016-11-03 LAB — TSH: TSH: 2.96 mIU/L (ref 0.40–4.50)

## 2016-11-04 DIAGNOSIS — Z4789 Encounter for other orthopedic aftercare: Secondary | ICD-10-CM | POA: Diagnosis not present

## 2016-11-04 DIAGNOSIS — R2232 Localized swelling, mass and lump, left upper limb: Secondary | ICD-10-CM | POA: Diagnosis not present

## 2016-11-10 DIAGNOSIS — H401131 Primary open-angle glaucoma, bilateral, mild stage: Secondary | ICD-10-CM | POA: Diagnosis not present

## 2016-11-10 DIAGNOSIS — Z4789 Encounter for other orthopedic aftercare: Secondary | ICD-10-CM | POA: Diagnosis not present

## 2016-11-10 DIAGNOSIS — R2232 Localized swelling, mass and lump, left upper limb: Secondary | ICD-10-CM | POA: Diagnosis not present

## 2016-11-16 ENCOUNTER — Encounter: Payer: Self-pay | Admitting: Family Medicine

## 2016-11-16 ENCOUNTER — Ambulatory Visit (INDEPENDENT_AMBULATORY_CARE_PROVIDER_SITE_OTHER): Payer: Medicare Other | Admitting: Family Medicine

## 2016-11-16 VITALS — BP 140/80 | HR 68 | Temp 98.4°F | Resp 18 | Ht 67.0 in | Wt 227.0 lb

## 2016-11-16 DIAGNOSIS — I10 Essential (primary) hypertension: Secondary | ICD-10-CM | POA: Diagnosis not present

## 2016-11-16 NOTE — Progress Notes (Signed)
Subjective:    Patient ID: [name], male    DOB: [birth year], 66 y.o.   MRN: [id number]  Hypertension     The patient states his blood pressure is been running high recently. On 2 separate occasions at his orthopedist office, his systolic blood pressure has been as high as 180. He is checking his blood pressure at home and finding his blood pressure ranged between 562 and 563 systolic. I checked it twice here. My nurse got 140/90. I got 150/90. Therefore I believe is more likely that his blood pressure is averaging between 893 and 734 systolic. Aside from his elevated blood pressure, he also reports occasional episodes of dizziness. He states that he will be doing nothing, he will break out in the cool sweat and feel very dizzy and lightheaded. Symptoms last a few seconds and then resolve spontaneously. There are no alleviating factors. There are no exacerbating factors. He denies any angina or shortness of breath or paroxysmal nocturnal dyspnea. He denies any pitting edema. He denies any palpitations or tachycardia. He denies any relationship to food or not eating or possible hypoglycemia.  AT that time, my plan was:  Blood pressure is elevated. Begin lisinopril 20 mg by mouth daily and recheck blood pressure in 2 weeks. I will also check a CBC, CMP, as well as a TSH given his disequilibrium and lightheadedness. I suspect the patient may be having vasovagal episodes possibly related to anxiety. I will evaluate for diabetes, anemia, or hyperthyroidism using this lab work. Recheck in 2 weeks or sooner if worse  11/16/16 Patient's lab work was completely normal. His home blood pressure is now averaging between 287 and 681 systolic over 15-72. He feels much better. His only residual symptom is some slight orthostatic dizziness when he stands up. This is brief and resolve spontaneously. It has been chronic and ongoing for several years. Of note he is taking fentanyl as well as could contribute to some  of his dizziness along with his antihypertensive medication  Past Medical History:  Diagnosis Date  . Arthritis    oa and ddd  . Back pain, chronic    pt states he has 3 herniated disks--uses fentanyl patch for pain  . Blood transfusion without reported diagnosis 1950   rH negative"at birth only"  . BPH (benign prostatic hyperplasia)    frequent urination, not able to hold urine well  . 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 Colon polyps   . Complication of anesthesia    pt had spinal for a knee replacement--"woke up" during surgery--and sick after surgery  . Depression   . Diverticulosis   . DVT (deep venous thrombosis) ([place]) 2001 or 2002   right leg-required hospitalization x 8 days  . DVT (deep venous thrombosis) ([place]) 1996   right  . GERD (gastroesophageal reflux disease)   . Glaucoma    both eyes  . H/O hiatal hernia   . Hearing impaired person, bilateral    bilateral  . Hemorrhoid   . History of IBS   . History of kidney stones    x 1-passed  . Hyperlipidemia 2012  . Lumbago   . Lumbar post-laminectomy syndrome   . Sleep apnea    pt uses cpap - setting of 16  . Ulcer 1966  . Ulcerative colitis ([place])   . Wears glasses   . Wears hearing aid    Past Surgical History:  Procedure Laterality Date  . APPENDECTOMY  1993  . BACK SURGERY  2008    bone spur removed   . bilateral carpal tunnel release 2009    . cervical fusion 2011--pt has good range of motion neck    . CHOLECYSTECTOMY  1994  . INSERTION OF MESH Bilateral 07/31/2016   Procedure: INSERTION OF MESH;  Surgeon: [name], MD;  [facility];  Service: General;  Laterality: Bilateral;  . JOINT REPLACEMENT Bilateral    2001 left total knee and 1985 right total knee  . MASS EXCISION Left 01/04/2014   Procedure: LEFT MIDDLE FINGER MASS EXCISION WITH NAILBED REPAIR AND ADVANCEMENT FLAP;  Surgeon: [name], MD;  Location: [place];  Service: Orthopedics;  Laterality:  Left;  . mass removed from 2nd finger Left aug 2014   squamous carcinoma  . nissen fundoplication [id number]    . PROSTATE SURGERY  2014   TURP  . right wrist fusion 12/12 - pt wearing brace on the wrist-not started physical therapy yet Right    fusion prevent limited ROM to right wrist  . SPINE SURGERY  2009   cervical spine fusion  . TOTAL KNEE REVISION Right 11/17/2013   Procedure: RIGHT KNEE POLYETHYLENE REVISION, bone graft;  Surgeon: [name], MD;  [facility];  Service: Orthopedics;  Laterality: Right;  . TRANSURETHRAL RESECTION OF PROSTATE  10/09/2011   Procedure: TRANSURETHRAL RESECTION OF THE PROSTATE WITH GYRUS INSTRUMENTS;  Surgeon: Fredricka Bonine, MD;  [facility];  Service: Urology;  Laterality: N/A;  . uvvvp    . XI ROBOTIC ASSISTED INGUINAL HERNIA REPAIR WITH MESH Bilateral 07/31/2016   Procedure: XI ROBOTIC BILATERAL INGUINAL HERNIA REPAIR WITH MESH;  Surgeon: [name], MD;  [facility];  Service: General;  Laterality: Bilateral;   Current Outpatient Prescriptions on File Prior to Visit  Medication Sig Dispense Refill  . aspirin 81 MG tablet Take 81 mg by mouth daily.    . Calcium Carbonate-Vitamin D (CALCIUM + D PO) Take 1 tablet by mouth daily.    . dorzolamide-timolol (COSOPT) 22.3-6.8 MG/ML ophthalmic solution Place 1 drop into both eyes 2 (two) times daily.    . fentaNYL (DURAGESIC - DOSED MCG/HR) 25 MCG/HR Place 1 patch onto the skin every 3 (three) days. For chronic back pain    . fluticasone (FLONASE) 50 MCG/ACT nasal spray Place 1 spray into the nose every evening.     Marland Kitchen lisinopril (PRINIVIL,ZESTRIL) 20 MG tablet Take 1 tablet (20 mg total) by mouth daily. 90 tablet 3  . Multiple Vitamin (MULTIVITAMIN WITH MINERALS) TABS tablet Take 1 tablet by mouth daily.    . Omega-3 Fatty Acids (FISH OIL) 1200 MG CAPS Take 1 capsule by mouth daily.    Marland Kitchen oxyCODONE (OXY IR/ROXICODONE) 5 MG immediate release tablet Take 1-2 tablets (5-10 mg total) by  mouth every 6 (six) hours as needed for severe pain. 30 tablet 0  . traZODone (DESYREL) 50 MG tablet Take 100 mg by mouth at bedtime as needed for sleep.     Marland Kitchen vortioxetine HBr (TRINTELLIX) 10 MG TABS Take 1 tablet (10 mg total) by mouth daily. 14 tablet 0   No current facility-administered medications on file prior to visit.    Allergies  Allergen Reactions  . Other     "Adhesives"Pt allergic to some tapes--pt request that we only use paper tape!!   Social History   Social History  . Marital status: Single    Spouse name: N/A  . Number of children:  N/A  . Years of education: N/A   Occupational History  . disabled    Social History Main Topics  . Smoking status: Former Smoker    Packs/day: 1.00    Years: 19.00    Types: Cigarettes    Quit date: 08/04/1983  . Smokeless tobacco: Never Used     Comment: quit smoking 1985  . Alcohol use 3.6 oz/week    6 Cans of beer per week     Comment: maybe 2 or 3 beers per week  . Drug use: No  . Sexual activity: Not Currently   Other Topics Concern  . Not on file   Social History Narrative  . No narrative on file     Review of Systems  All other systems reviewed and are negative.      Objective:   Physical Exam  Neck: Neck supple. No JVD present. No thyromegaly present.  Cardiovascular: Normal rate, regular rhythm and normal heart sounds.   Pulmonary/Chest: Effort normal and breath sounds normal. No respiratory distress. He has no wheezes. He has no rales. He exhibits no tenderness.  Abdominal: Soft. Bowel sounds are normal. He exhibits no distension and no mass. There is no tenderness. There is no rebound and no guarding.  Musculoskeletal: He exhibits no edema.  Lymphadenopathy:    He has no cervical adenopathy.  Vitals reviewed.         Assessment & Plan:  Benign essential HTN  Continue the lisinopril. His blood pressures now acceptable. Recheck in one month or sooner if worsening. I believe his dizziness is likely  secondary to medication at this time. However the patient benefits from the fentanyl and desires not to make any changes in his chronic pain medication

## 2016-11-17 DIAGNOSIS — Z4789 Encounter for other orthopedic aftercare: Secondary | ICD-10-CM | POA: Diagnosis not present

## 2017-01-13 DIAGNOSIS — M25511 Pain in right shoulder: Secondary | ICD-10-CM | POA: Diagnosis not present

## 2017-01-13 DIAGNOSIS — G8929 Other chronic pain: Secondary | ICD-10-CM | POA: Diagnosis not present

## 2017-01-13 DIAGNOSIS — M25512 Pain in left shoulder: Secondary | ICD-10-CM | POA: Diagnosis not present

## 2017-01-25 DIAGNOSIS — M25511 Pain in right shoulder: Secondary | ICD-10-CM | POA: Diagnosis not present

## 2017-01-25 DIAGNOSIS — G894 Chronic pain syndrome: Secondary | ICD-10-CM | POA: Diagnosis not present

## 2017-01-25 DIAGNOSIS — G8929 Other chronic pain: Secondary | ICD-10-CM | POA: Diagnosis not present

## 2017-02-10 DIAGNOSIS — H401131 Primary open-angle glaucoma, bilateral, mild stage: Secondary | ICD-10-CM | POA: Diagnosis not present

## 2017-02-11 ENCOUNTER — Ambulatory Visit (INDEPENDENT_AMBULATORY_CARE_PROVIDER_SITE_OTHER): Payer: Medicare Other | Admitting: Family Medicine

## 2017-02-11 ENCOUNTER — Encounter: Payer: Self-pay | Admitting: Family Medicine

## 2017-02-11 VITALS — BP 118/80 | HR 76 | Temp 98.6°F | Resp 18 | Ht 67.0 in | Wt 225.0 lb

## 2017-02-11 DIAGNOSIS — R252 Cramp and spasm: Secondary | ICD-10-CM

## 2017-02-11 MED ORDER — TIZANIDINE HCL 4 MG PO TABS: 4.0000 mg | ORAL_TABLET | Freq: Four times a day (QID) | ORAL | 0 refills | Status: DC | PRN

## 2017-02-11 NOTE — Progress Notes (Signed)
Subjective:    Patient ID: [name], male    DOB: 19-Jan-1951, 66 y.o.   MRN: [id number]  HPI Patient states for the last 3 weeks, he's been getting cramps every night when he tries to sleep. They're severe. They involve both calf muscles. They occur even during the daytime. He is tried sports drinks, eating mustard, eating bananas. Note these are working. He is working outside and sweating quite a bit. Past Medical History:  Diagnosis Date  . Arthritis    oa and ddd  . Back pain, chronic    pt states he has 3 herniated disks--uses fentanyl patch for pain  . Blood transfusion without reported diagnosis 1950   rH negative"at birth only"  . BPH (benign prostatic hyperplasia)    frequent urination, not able to hold urine well  . 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 Colon polyps   . Complication of anesthesia    pt had spinal for a knee replacement--"woke up" during surgery--and sick after surgery  . Depression   . Diverticulosis   . DVT (deep venous thrombosis) ([place]) 2001 or 2002   right leg-required hospitalization x 8 days  . DVT (deep venous thrombosis) ([place]) 1996   right  . GERD (gastroesophageal reflux disease)   . Glaucoma    both eyes  . H/O hiatal hernia   . Hearing impaired person, bilateral    bilateral  . Hemorrhoid   . History of IBS   . History of kidney stones    x 1-passed  . Hyperlipidemia 2012  . Lumbago   . Lumbar post-laminectomy syndrome   . Sleep apnea    pt uses cpap - setting of 16  . Ulcer 1966  . Ulcerative colitis ([place])   . Wears glasses   . Wears hearing aid    Past Surgical History:  Procedure Laterality Date  . APPENDECTOMY  1993  . BACK SURGERY  2008    bone spur removed   . bilateral carpal tunnel release 2009    . cervical fusion 2011--pt has good range of motion neck    . CHOLECYSTECTOMY  1994  . INSERTION OF MESH Bilateral 07/31/2016   Procedure: INSERTION OF MESH;  Surgeon: [name], MD;  [facility];  Service: General;  Laterality: Bilateral;  . JOINT REPLACEMENT Bilateral    2001 left total knee and 1985 right total knee  . MASS EXCISION Left 01/04/2014   Procedure: LEFT MIDDLE FINGER MASS EXCISION WITH NAILBED REPAIR AND ADVANCEMENT FLAP;  Surgeon: [name], MD;  Location: [place];  Service: Orthopedics;  Laterality: Left;  . mass removed from 2nd finger Left aug 2014   squamous carcinoma  . nissen fundoplication [id number]    . PROSTATE SURGERY  2014   TURP  . right wrist fusion 12/12 - pt wearing brace on the wrist-not started physical therapy yet Right    fusion prevent limited ROM to right wrist  . SPINE SURGERY  2009   cervical spine fusion  . TOTAL KNEE REVISION Right 11/17/2013   Procedure: RIGHT KNEE POLYETHYLENE REVISION, bone graft;  Surgeon: [name], MD;  [facility];  Service: Orthopedics;  Laterality: Right;  . TRANSURETHRAL RESECTION OF PROSTATE  10/09/2011   Procedure: TRANSURETHRAL RESECTION OF THE PROSTATE WITH GYRUS INSTRUMENTS;  Surgeon: Fredricka Bonine, MD;  [facility];  Service: Urology;  Laterality: N/A;  . uvvvp    . XI ROBOTIC  ASSISTED INGUINAL HERNIA REPAIR WITH MESH Bilateral 07/31/2016   Procedure: XI ROBOTIC BILATERAL INGUINAL HERNIA REPAIR WITH MESH;  Surgeon: [name], MD;  [facility];  Service: General;  Laterality: Bilateral;   Current Outpatient Prescriptions on File Prior to Visit  Medication Sig Dispense Refill  . aspirin 81 MG tablet Take 81 mg by mouth daily.    . Calcium Carbonate-Vitamin D (CALCIUM + D PO) Take 1 tablet by mouth daily.    . dorzolamide-timolol (COSOPT) 22.3-6.8 MG/ML ophthalmic solution Place 1 drop into both eyes 2 (two) times daily.    . fentaNYL (DURAGESIC - DOSED MCG/HR) 25 MCG/HR Place 1 patch onto the skin every 3 (three) days. For chronic back pain    . fluticasone (FLONASE) 50 MCG/ACT nasal spray Place 1 spray into the nose every evening.     Marland Kitchen lisinopril  (PRINIVIL,ZESTRIL) 20 MG tablet Take 1 tablet (20 mg total) by mouth daily. 90 tablet 3  . Multiple Vitamin (MULTIVITAMIN WITH MINERALS) TABS tablet Take 1 tablet by mouth daily.    . Omega-3 Fatty Acids (FISH OIL) 1200 MG CAPS Take 1 capsule by mouth daily.    . traZODone (DESYREL) 50 MG tablet Take 100 mg by mouth at bedtime as needed for sleep.     Marland Kitchen vortioxetine HBr (TRINTELLIX) 10 MG TABS Take 1 tablet (10 mg total) by mouth daily. 14 tablet 0   No current facility-administered medications on file prior to visit.    Allergies  Allergen Reactions  . Other     "Adhesives"Pt allergic to some tapes--pt request that we only use paper tape!!   Social History   Social History  . Marital status: Single    Spouse name: N/A  . Number of children: N/A  . Years of education: N/A   Occupational History  . disabled    Social History Main Topics  . Smoking status: Former Smoker    Packs/day: 1.00    Years: 19.00    Types: Cigarettes    Quit date: 08/04/1983  . Smokeless tobacco: Never Used     Comment: quit smoking 1985  . Alcohol use 3.6 oz/week    6 Cans of beer per week     Comment: maybe 2 or 3 beers per week  . Drug use: No  . Sexual activity: Not Currently   Other Topics Concern  . Not on file   Social History Narrative  . No narrative on file      Review of Systems  All other systems reviewed and are negative.      Objective:   Physical Exam  Cardiovascular: Normal rate, regular rhythm and normal heart sounds.   Pulses:      Dorsalis pedis pulses are 2+ on the right side, and 2+ on the left side.       Posterior tibial pulses are 2+ on the right side, and 2+ on the left side.  Pulmonary/Chest: Effort normal and breath sounds normal. No respiratory distress. He has no wheezes. He has no rales.  Musculoskeletal:       Right lower leg: Normal. He exhibits no tenderness, no swelling and no edema.       Left lower leg: Normal. He exhibits no tenderness, no swelling  and no edema.  Vitals reviewed.         Assessment & Plan:  Nocturnal foot cramps - Plan: tiZANidine (ZANAFLEX) 4 MG tablet, COMPLETE METABOLIC PANEL WITH GFR, Magnesium  Recommended the patient start magnesium oxide  400 mg by mouth daily. Start Zanaflex 4 mg by mouth daily at bedtime. He can take an additional tablet during the daytime every 6 hours as needed. Drink plenty of fluids.. Check a CMP as well as a magnesium level. If cramps persist, consider trying chloroquine.

## 2017-02-12 LAB — COMPLETE METABOLIC PANEL WITH GFR
ALT: 30 U/L (ref 9–46)
AST: 23 U/L (ref 10–35)
Albumin: 4.3 g/dL (ref 3.6–5.1)
Alkaline Phosphatase: 79 U/L (ref 40–115)
BUN: 16 mg/dL (ref 7–25)
CHLORIDE: 104 mmol/L (ref 98–110)
CO2: 28 mmol/L (ref 20–31)
CREATININE: 1 mg/dL (ref 0.70–1.25)
Calcium: 9.4 mg/dL (ref 8.6–10.3)
GFR, EST NON AFRICAN AMERICAN: 78 mL/min (ref >=60)
GLUCOSE: 87 mg/dL (ref 70–99)
Potassium: 4.4 mmol/L (ref 3.5–5.3)
SODIUM: 140 mmol/L (ref 135–146)
Total Bilirubin: 0.5 mg/dL (ref 0.2–1.2)
Total Protein: 6.7 g/dL (ref 6.1–8.1)

## 2017-02-12 LAB — MAGNESIUM: Magnesium: 1.9 mg/dL (ref 1.5–2.5)

## 2017-02-17 DIAGNOSIS — H1045 Other chronic allergic conjunctivitis: Secondary | ICD-10-CM | POA: Diagnosis not present

## 2017-03-22 ENCOUNTER — Emergency Department (HOSPITAL_COMMUNITY)
Admission: EM | Admit: 2017-03-22 | Discharge: 2017-03-22 | Disposition: A | Discharge status: Home / Self Care | Payer: Medicare Other | Attending: Emergency Medicine | Admitting: Emergency Medicine

## 2017-03-22 ENCOUNTER — Encounter (HOSPITAL_COMMUNITY): Payer: Self-pay | Admitting: Emergency Medicine

## 2017-03-22 DIAGNOSIS — K1120 Sialoadenitis, unspecified: Secondary | ICD-10-CM

## 2017-03-22 DIAGNOSIS — Z79899 Other long term (current) drug therapy: Secondary | ICD-10-CM | POA: Diagnosis not present

## 2017-03-22 DIAGNOSIS — H608X2 Other otitis externa, left ear: Secondary | ICD-10-CM | POA: Insufficient documentation

## 2017-03-22 DIAGNOSIS — Z87891 Personal history of nicotine dependence: Secondary | ICD-10-CM | POA: Diagnosis not present

## 2017-03-22 DIAGNOSIS — Z85828 Personal history of other malignant neoplasm of skin: Secondary | ICD-10-CM | POA: Diagnosis not present

## 2017-03-22 DIAGNOSIS — Z86718 Personal history of other venous thrombosis and embolism: Secondary | ICD-10-CM | POA: Insufficient documentation

## 2017-03-22 DIAGNOSIS — H9202 Otalgia, left ear: Secondary | ICD-10-CM | POA: Diagnosis present

## 2017-03-22 DIAGNOSIS — Z7982 Long term (current) use of aspirin: Secondary | ICD-10-CM | POA: Insufficient documentation

## 2017-03-22 DIAGNOSIS — H60502 Unspecified acute noninfective otitis externa, left ear: Secondary | ICD-10-CM

## 2017-03-22 MED ORDER — NEOMYCIN-POLYMYXIN-HC 3.5-10000-1 OT SUSP
4.0000 [drp] | Freq: Once | OTIC | Status: AC
  Administered 2017-03-22: 4 [drp] via OTIC
  Filled 2017-03-22: qty 10

## 2017-03-22 MED ORDER — NEOMYCIN-POLYMYXIN-HC 3.5-10000-1 OT SUSP: 4.0000 [drp] | Freq: Four times a day (QID) | OTIC | 0 refills | Status: AC

## 2017-03-22 MED ORDER — AMOXICILLIN-POT CLAVULANATE 875-125 MG PO TABS: 1.0000 | ORAL_TABLET | Freq: Two times a day (BID) | ORAL | 0 refills | Status: AC

## 2017-03-22 MED ORDER — AMOXICILLIN-POT CLAVULANATE 875-125 MG PO TABS
1.0000 | ORAL_TABLET | Freq: Once | ORAL | Status: AC
  Administered 2017-03-22: 1 via ORAL
  Filled 2017-03-22: qty 1

## 2017-03-22 NOTE — Discharge Instructions (Signed)
Your exam today showed evidence of otitis externa (external ear infection) and parotitis. Please take the antibiotics orally and the drops to treat the infections. Please follow-up with your primary care physician in the next several days. If any symptoms change or worsen, please return to the nearest emergency department.

## 2017-03-22 NOTE — ED Notes (Signed)
ED Provider at bedside. 

## 2017-03-22 NOTE — ED Notes (Signed)
Bed: [facility] Expected date:  Expected time:  Means of arrival:  Comments: Triage 4

## 2017-03-22 NOTE — ED Triage Notes (Signed)
Patient states he is having problems hearing out of right ear and having pain. Patient states this started Friday and today he feels like his whole head is stopped up.

## 2017-03-22 NOTE — ED Notes (Signed)
Ear drops went home with pt. Given to wife

## 2017-03-22 NOTE — ED Provider Notes (Signed)
[facility] Provider Note   CSN: [id number] Arrival date & time: 03/22/17  0554     History   Chief Complaint Chief Complaint  Patient presents with  . Otalgia    HPI [name] is a 66 y.o. male.    The history is provided by the patient, the spouse and medical records.  Otalgia  This is a new problem. The current episode started more than 2 days ago. There is pain in the left ear. The problem occurs constantly. The problem has been gradually worsening. There has been no fever. The pain is moderate. Associated symptoms include hearing loss (slightly decreased hearing) and rhinorrhea. Pertinent negatives include no ear discharge, no headaches, no sore throat, no abdominal pain, no diarrhea, no vomiting, no neck pain, no cough and no rash. His past medical history does not include chronic ear infection.    Past Medical History:  Diagnosis Date  . Arthritis    oa and ddd  . Back pain, chronic    pt states he has 3 herniated disks--uses fentanyl patch for pain  . Blood transfusion without reported diagnosis 1950   rH negative"at birth only"  . BPH (benign prostatic hyperplasia)    frequent urination, not able to hold urine well  . 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 Colon polyps   . Complication of anesthesia    pt had spinal for a knee replacement--"woke up" during surgery--and sick after surgery  . Depression   . Diverticulosis   . DVT (deep venous thrombosis) ([place]) 2001 or 2002   right leg-required hospitalization x 8 days  . DVT (deep venous thrombosis) ([place]) 1996   right  . GERD (gastroesophageal reflux disease)   . Glaucoma    both eyes  . H/O hiatal hernia   . Hearing impaired person, bilateral    bilateral  . Hemorrhoid   . History of IBS   . History of kidney stones    x 1-passed  . Hyperlipidemia 2012  . Lumbago   . Lumbar post-laminectomy syndrome   . Sleep apnea    pt uses cpap - setting of 16  . Ulcer 1966  .  Ulcerative colitis ([place])   . Wears glasses   . Wears hearing aid     Patient Active Problem List   Diagnosis Date Noted  . Robotic XI TAPP  bilateral inguinal hernia repair Dec 2017 07/31/2016  . S/P bilateral inguinal herniorrhaphy 07/31/2016  . Lumbar post-laminectomy syndrome   . 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 OA (osteoarthritis) of knee 11/17/2013  . OSA (obstructive sleep apnea) 12/05/2012  . GERD 12/23/2007  . RLQ PAIN 12/23/2007  . PERSONAL HX COLONIC POLYPS 12/23/2007    Past Surgical History:  Procedure Laterality Date  . APPENDECTOMY  1993  . BACK SURGERY  2008    bone spur removed   . bilateral carpal tunnel release 2009    . cervical fusion 2011--pt has good range of motion neck    . CHOLECYSTECTOMY  1994  . INSERTION OF MESH Bilateral 07/31/2016   Procedure: INSERTION OF MESH;  Surgeon: [name], MD;  Location: [facility];  Service: General;  Laterality: Bilateral;  . JOINT REPLACEMENT Bilateral    2001 left total knee and 1985 right total knee  . MASS EXCISION Left 01/04/2014   Procedure: LEFT MIDDLE FINGER MASS EXCISION WITH NAILBED REPAIR AND ADVANCEMENT FLAP;  Surgeon: [name], MD;  Location: [facility];  Service: Orthopedics;  Laterality: Left;  . mass removed from 2nd finger Left aug 2014   squamous carcinoma  . nissen fundoplication 6606    . PROSTATE SURGERY  2014   TURP  . right wrist fusion 12/12 - pt wearing brace on the wrist-not started physical therapy yet Right    fusion prevent limited ROM to right wrist  . SPINE SURGERY  2009   cervical spine fusion  . TOTAL KNEE REVISION Right 11/17/2013   Procedure: RIGHT KNEE POLYETHYLENE REVISION, bone graft;  Surgeon: [name], MD;  Location: [facility];  Service: Orthopedics;  Laterality: Right;  . TRANSURETHRAL RESECTION OF PROSTATE  10/09/2011   Procedure: TRANSURETHRAL RESECTION OF THE PROSTATE WITH GYRUS INSTRUMENTS;  Surgeon: Fredricka Bonine, MD;   Location: [facility];  Service: Urology;  Laterality: N/A;  . uvvvp    . XI ROBOTIC ASSISTED INGUINAL HERNIA REPAIR WITH MESH Bilateral 07/31/2016   Procedure: XI ROBOTIC BILATERAL INGUINAL HERNIA REPAIR WITH MESH;  Surgeon: [name], MD;  Location: [facility];  Service: General;  Laterality: Bilateral;       Home Medications    Prior to Admission medications   Medication Sig Start Date End Date Taking? Authorizing Provider  aspirin 81 MG tablet Take 81 mg by mouth daily.    [provider]  Calcium Carbonate-Vitamin D (CALCIUM + D PO) Take 1 tablet by mouth daily.    [provider]  dorzolamide-timolol (COSOPT) 22.3-6.8 MG/ML ophthalmic solution Place 1 drop into both eyes 2 (two) times daily.    [provider]  fentaNYL (DURAGESIC - DOSED MCG/HR) 25 MCG/HR Place 1 patch onto the skin every 3 (three) days. For chronic back pain    [provider]  fluticasone (FLONASE) 50 MCG/ACT nasal spray Place 1 spray into the nose every evening.     [provider]  lisinopril (PRINIVIL,ZESTRIL) 20 MG tablet Take 1 tablet (20 mg total) by mouth daily. 11/02/16   [name], MD  Multiple Vitamin (MULTIVITAMIN WITH MINERALS) TABS tablet Take 1 tablet by mouth daily.    [provider]  Omega-3 Fatty Acids (FISH OIL) 1200 MG CAPS Take 1 capsule by mouth daily.    [provider]  tiZANidine (ZANAFLEX) 4 MG tablet Take 1 tablet (4 mg total) by mouth every 6 (six) hours as needed for muscle spasms. 02/11/17   [name], MD  traZODone (DESYREL) 50 MG tablet Take 100 mg by mouth at bedtime as needed for sleep.  09/30/12   [provider]  vortioxetine HBr (TRINTELLIX) 10 MG TABS Take 1 tablet (10 mg total) by mouth daily. 06/17/16   [name], MD    Family History Family History  Problem Relation Age of Onset  . Colon cancer Father   . Bone cancer Father        deceased  . Cancer Father   . Diabetes Father   .  Prostate cancer Father   . Arthritis Mother   . Hearing loss Mother   . Hyperlipidemia Mother   . Hypertension Mother   . Deep vein thrombosis Mother   . Diabetes Sister   . Hearing loss Maternal Grandmother   . Diabetes Paternal Grandfather     Social History Social History  Substance Use Topics  . Smoking status: Former Smoker    Packs/day: 1.00    Years: 19.00    Types: Cigarettes    Quit date: 08/04/1983  . Smokeless tobacco:  Never Used     Comment: quit smoking 1985  . Alcohol use 3.6 oz/week    6 Cans of beer per week     Comment: maybe 2 or 3 beers per week     Allergies   Other   Review of Systems Review of Systems  Constitutional: Negative for chills, diaphoresis, fatigue and fever.  HENT: Positive for congestion, ear pain, hearing loss (slightly decreased hearing) and rhinorrhea. Negative for ear discharge, facial swelling, mouth sores, nosebleeds, sore throat, tinnitus, trouble swallowing and voice change.   Eyes: Negative for visual disturbance.  Respiratory: Negative for cough and chest tightness.   Gastrointestinal: Negative for abdominal pain, constipation, diarrhea, nausea and vomiting.  Genitourinary: Negative for dysuria.  Musculoskeletal: Negative for back pain and neck pain.  Skin: Negative for rash.  Neurological: Negative for syncope, speech difficulty, light-headedness and headaches.  Psychiatric/Behavioral: Negative for agitation.  All other systems reviewed and are negative.    Physical Exam Updated Vital Signs BP (!) 143/83 (BP Location: Left Arm)   Pulse 73   Temp 98.7 F (37.1 C) (Oral)   Resp 16   Ht 5' 7"  (1.702 m)   Wt 102.1 kg (225 lb)   SpO2 95%   BMI 35.24 kg/m   Physical Exam  Constitutional: He is oriented to person, place, and time. He appears well-developed and well-nourished.  HENT:  Right Ear: Tympanic membrane, external ear and ear canal normal. Tympanic membrane is not erythematous.  Left Ear: Tympanic membrane  normal. Tympanic membrane is not erythematous.  Nose: Nose normal.  Mouth/Throat: Oropharynx is clear and moist. No oropharyngeal exudate.  Evidence of left otitis externa with erythema and swelling in the ear canal. No hemotympanum. No TM perforation present. No cerumen impaction. No otitis media.  Also tenderness and swelling of the left parotid gland. No tenderness of the mastoid.  No evidence of PTA or RPA. Normal neck movement without pain. No stridor. Extraocular movements intact. No sinus tenderness. Congestion.  Eyes: Pupils are equal, round, and reactive to light. Conjunctivae and EOM are normal.  Neck: Normal range of motion. Neck supple.  Cardiovascular: Normal rate and intact distal pulses.   No murmur heard. Pulmonary/Chest: Effort normal and breath sounds normal. No stridor. No respiratory distress.  Abdominal: Soft. There is no tenderness.  Musculoskeletal: He exhibits no edema or tenderness.  Neurological: He is alert and oriented to person, place, and time. No cranial nerve deficit or sensory deficit. He exhibits normal muscle tone.  Skin: Skin is warm and dry. Capillary refill takes less than 2 seconds. No erythema. No pallor.  Psychiatric: He has a normal mood and affect.  Nursing note and vitals reviewed.    ED Treatments / Results  Labs (all labs ordered are listed, but only abnormal results are displayed) Labs Reviewed - No data to display  EKG  EKG Interpretation None       Radiology No results found.  Procedures Procedures (including critical care time)  Medications Ordered in ED Medications  amoxicillin-clavulanate (AUGMENTIN) 875-125 MG per tablet 1 tablet (1 tablet Oral Given 03/22/17 0904)  neomycin-polymyxin-hydrocortisone (CORTISPORIN) OTIC (EAR) suspension 4 drop (4 drops Left EAR Given 03/22/17 0904)     Initial Impression / Assessment and Plan / ED Course  I have reviewed the triage vital signs and the nursing notes.  Pertinent labs &  imaging results that were available during my care of the patient were reviewed by me and considered in my medical decision making (see  chart for details).     BOWIE DELIA is a 66 y.o. male with past medical history significant for sleep apnea, bilateral hearing loss with intermittent hearing aid use, and GERD who presents with left ear pain. Patient reports that for the last 4 days, he has had left-sided otalgia. He reports that when he showers, he uses Q-tips to clean out his ears. He says that the pain has worsened and he has had worsened hearing out of the left side. He has not been using his hearing aids due to the pain. He also says he is having pain just below his left ear near his jaw. He has no trismus. Patient denies fevers, chills, neck pain or difficulty breathing or swallowing. He denies any visual changes or facial pain.  On exam, right ear showed no evidence of infection. Left ear showed evidence of otitis externa. No evidence of otitis media or perforated TM. No cerumen. Patient has swelling and tenderness of the left parotid gland. No evidence of PTA or RPA. Doubt mastoiditis or other deep space neck infection.   Physical exam otherwise unremarkable. Nl neck ROM, doubt meningitis.   Patient will be given drops to treat his otitis externa and Augmentin to treat the parotitis. Do not feel patient has other deep neck infection and no evidence of mastoiditis. Patient given prescription for antibiotics and instructions to follow-up with PCP. Patient given return precautions. Patient had no other questions or concerns and was discharged in good condition with understanding of the plan of care.   Final Clinical Impressions(s) / ED Diagnoses   Final diagnoses:  Acute otitis externa of left ear, unspecified type  Parotitis    New Prescriptions New Prescriptions   AMOXICILLIN-CLAVULANATE (AUGMENTIN) 875-125 MG TABLET    Take 1 tablet by mouth every 12 (twelve) hours.    NEOMYCIN-POLYMYXIN-HYDROCORTISONE (CORTISPORIN) 3.5-10000-1 OTIC SUSPENSION    Place 4 drops into the left ear 4 (four) times daily.    Clinical Impression: 1. Acute otitis externa of left ear, unspecified type   2. Parotitis     Disposition: Discharge  Condition: Good  I have discussed the results, Dx and Tx plan with the pt(& family if present). He/she/they expressed understanding and agree(s) with the plan. Discharge instructions discussed at great length. Strict return precautions discussed and pt &/or family have verbalized understanding of the instructions. No further questions at time of discharge.    New Prescriptions   AMOXICILLIN-CLAVULANATE (AUGMENTIN) 875-125 MG TABLET    Take 1 tablet by mouth every 12 (twelve) hours.   NEOMYCIN-POLYMYXIN-HYDROCORTISONE (CORTISPORIN) 3.5-10000-1 OTIC SUSPENSION    Place 4 drops into the left ear 4 (four) times daily.    Follow Up: [facility] [id number] [place] [phone]  If symptoms worsen  [name], MD [address] [place] [address] [phone]        Nour Scalise, Gwenyth Allegra, MD 03/22/17 1044

## 2017-03-23 DIAGNOSIS — H60333 Swimmer's ear, bilateral: Secondary | ICD-10-CM | POA: Diagnosis not present

## 2017-03-23 DIAGNOSIS — H6012 Cellulitis of left external ear: Secondary | ICD-10-CM | POA: Diagnosis not present

## 2017-03-26 DIAGNOSIS — H60333 Swimmer's ear, bilateral: Secondary | ICD-10-CM | POA: Diagnosis not present

## 2017-04-02 DIAGNOSIS — H60333 Swimmer's ear, bilateral: Secondary | ICD-10-CM | POA: Diagnosis not present

## 2017-04-02 DIAGNOSIS — H903 Sensorineural hearing loss, bilateral: Secondary | ICD-10-CM | POA: Diagnosis not present

## 2017-04-06 ENCOUNTER — Other Ambulatory Visit: Payer: Medicare Other

## 2017-04-06 DIAGNOSIS — I10 Essential (primary) hypertension: Secondary | ICD-10-CM

## 2017-04-06 DIAGNOSIS — F329 Major depressive disorder, single episode, unspecified: Secondary | ICD-10-CM

## 2017-04-06 DIAGNOSIS — E785 Hyperlipidemia, unspecified: Secondary | ICD-10-CM | POA: Diagnosis not present

## 2017-04-06 DIAGNOSIS — Z87898 Personal history of other specified conditions: Secondary | ICD-10-CM | POA: Diagnosis not present

## 2017-04-06 DIAGNOSIS — Z79899 Other long term (current) drug therapy: Secondary | ICD-10-CM

## 2017-04-06 DIAGNOSIS — Z0000 Encounter for general adult medical examination without abnormal findings: Secondary | ICD-10-CM

## 2017-04-06 DIAGNOSIS — F32A Depression, unspecified: Secondary | ICD-10-CM

## 2017-04-06 LAB — CBC WITH DIFFERENTIAL/PLATELET
BASOS ABS: 50 {cells}/uL (ref 0–200)
BASOS PCT: 1 %
EOS ABS: 200 {cells}/uL (ref 15–500)
Eosinophils Relative: 4 %
HEMATOCRIT: 45.4 % (ref 38.5–50.0)
Hemoglobin: 15.6 g/dL (ref 13.0–17.0)
LYMPHS PCT: 29 %
Lymphs Abs: 1450 cells/uL (ref 850–3900)
MCH: 33.1 pg — AB (ref 27.0–33.0)
MCHC: 34.4 g/dL (ref 32.0–36.0)
MCV: 96.2 fL (ref 80.0–100.0)
MONO ABS: 650 {cells}/uL (ref 200–950)
MPV: 9.4 fL (ref 7.5–12.5)
Monocytes Relative: 13 %
Neutro Abs: 2650 cells/uL (ref 1500–7800)
Neutrophils Relative %: 53 %
Platelets: 221 10*3/uL (ref 140–400)
RBC: 4.72 MIL/uL (ref 4.20–5.80)
RDW: 13.4 % (ref 11.0–15.0)
WBC: 5 10*3/uL (ref 3.8–10.8)

## 2017-04-07 LAB — COMPLETE METABOLIC PANEL WITH GFR
ALBUMIN: 4.1 g/dL (ref 3.6–5.1)
ALK PHOS: 65 U/L (ref 40–115)
ALT: 30 U/L (ref 9–46)
AST: 22 U/L (ref 10–35)
BUN: 14 mg/dL (ref 7–25)
CALCIUM: 9.2 mg/dL (ref 8.6–10.3)
CHLORIDE: 104 mmol/L (ref 98–110)
CO2: 25 mmol/L (ref 20–32)
Creat: 0.94 mg/dL (ref 0.70–1.25)
GFR, EST NON AFRICAN AMERICAN: 83 mL/min (ref >=60)
Glucose, Bld: 104 mg/dL — ABNORMAL HIGH (ref 70–99)
POTASSIUM: 4.4 mmol/L (ref 3.5–5.3)
Sodium: 140 mmol/L (ref 135–146)
Total Bilirubin: 0.6 mg/dL (ref 0.2–1.2)
Total Protein: 6.7 g/dL (ref 6.1–8.1)

## 2017-04-07 LAB — LIPID PANEL
CHOL/HDL RATIO: 4.8 ratio (ref <5.0)
CHOLESTEROL: 223 mg/dL — AB (ref <200)
HDL: 46 mg/dL (ref >40)
LDL Cholesterol: 156 mg/dL — ABNORMAL HIGH (ref <100)
TRIGLYCERIDES: 103 mg/dL (ref <150)
VLDL: 21 mg/dL (ref <30)

## 2017-04-07 LAB — PSA: PSA: 1.6 ng/mL (ref <=4.0)

## 2017-04-08 ENCOUNTER — Ambulatory Visit (INDEPENDENT_AMBULATORY_CARE_PROVIDER_SITE_OTHER): Payer: Medicare Other | Admitting: Family Medicine

## 2017-04-08 ENCOUNTER — Encounter: Payer: Self-pay | Admitting: Family Medicine

## 2017-04-08 VITALS — BP 120/64 | HR 70 | Temp 98.2°F | Resp 16 | Ht 67.0 in | Wt 232.0 lb

## 2017-04-08 DIAGNOSIS — I10 Essential (primary) hypertension: Secondary | ICD-10-CM

## 2017-04-08 DIAGNOSIS — Z0000 Encounter for general adult medical examination without abnormal findings: Secondary | ICD-10-CM

## 2017-04-08 DIAGNOSIS — Z23 Encounter for immunization: Secondary | ICD-10-CM | POA: Diagnosis not present

## 2017-04-08 MED ORDER — ATORVASTATIN CALCIUM 40 MG PO TABS: 40.0000 mg | ORAL_TABLET | Freq: Every day | ORAL | 3 refills | Status: DC

## 2017-04-08 NOTE — Progress Notes (Signed)
Subjective:    Patient ID: [name], male    DOB: [birth year], 66 y.o.   MRN: [id number]  HPI Patient is here today for complete physical exam. Overall he is doing well. He does complain of Occasional cough most days as well as head congestion which he attributes to allergies although he is on lisinopril. Immunizations are up-to-date however he is due for annual flu shot. His colonoscopy was performed last year and is good for 5 years. He declines a digital rectal exam but his PSA is normal at 1.6. The remainder of his preventative care is up-to-date. He declines hepatitis C screening today. He does report occasional pain in the right lower quadrant. The pain is crampy. There are no exacerbating or alleviating factors. It has been off and on for several months. Had a CT scan in November that revealed no abnormalities. Colonoscopy last year was normal. He thinks it may be due to IBS. Most recent lab work as listed below: Recent Results (from the past 2160 hour(s))  COMPLETE METABOLIC PANEL WITH GFR     Status: None   Collection Time: 02/11/17  3:55 PM  Result Value Ref Range   Sodium 140 135 - 146 mmol/L   Potassium 4.4 3.5 - 5.3 mmol/L   Chloride 104 98 - 110 mmol/L   CO2 28 20 - 31 mmol/L   Glucose, Bld 87 70 - 99 mg/dL   BUN 16 7 - 25 mg/dL   Creat 1.00 0.70 - 1.25 mg/dL    Comment:   For patients > or = 68 years of age: The upper reference limit for Creatinine is approximately 13% higher for people identified as African-American.      Total Bilirubin 0.5 0.2 - 1.2 mg/dL   Alkaline Phosphatase 79 40 - 115 U/L   AST 23 10 - 35 U/L   ALT 30 9 - 46 U/L   Total Protein 6.7 6.1 - 8.1 g/dL   Albumin 4.3 3.6 - 5.1 g/dL   Calcium 9.4 8.6 - 10.3 mg/dL   GFR, Est African American >89 >=60 mL/min   GFR, Est Non African American 78 >=60 mL/min  Magnesium     Status: None   Collection Time: 02/11/17  3:55 PM  Result Value Ref Range   Magnesium 1.9 1.5 - 2.5 mg/dL  COMPLETE METABOLIC  PANEL WITH GFR     Status: Abnormal   Collection Time: 04/06/17  8:25 AM  Result Value Ref Range   Sodium 140 135 - 146 mmol/L   Potassium 4.4 3.5 - 5.3 mmol/L   Chloride 104 98 - 110 mmol/L   CO2 25 20 - 32 mmol/L    Comment: ** Please note change in reference range(s). **      Glucose, Bld 104 (H) 70 - 99 mg/dL   BUN 14 7 - 25 mg/dL   Creat 0.94 0.70 - 1.25 mg/dL    Comment:   For patients > or = 68 years of age: The upper reference limit for Creatinine is approximately 13% higher for people identified as African-American.      Total Bilirubin 0.6 0.2 - 1.2 mg/dL   Alkaline Phosphatase 65 40 - 115 U/L   AST 22 10 - 35 U/L   ALT 30 9 - 46 U/L   Total Protein 6.7 6.1 - 8.1 g/dL   Albumin 4.1 3.6 - 5.1 g/dL   Calcium 9.2 8.6 - 10.3 mg/dL   GFR, Est African American >89 >=60 mL/min  GFR, Est Non African American 83 >=60 mL/min  Lipid panel     Status: Abnormal   Collection Time: 04/06/17  8:25 AM  Result Value Ref Range   Cholesterol 223 (H) <200 mg/dL   Triglycerides 103 <150 mg/dL   HDL 46 >40 mg/dL   Total CHOL/HDL Ratio 4.8 <5.0 Ratio   VLDL 21 <30 mg/dL   LDL Cholesterol 156 (H) <100 mg/dL  CBC with Differential/Platelet     Status: Abnormal   Collection Time: 04/06/17  8:25 AM  Result Value Ref Range   WBC 5.0 3.8 - 10.8 K/uL   RBC 4.72 4.20 - 5.80 MIL/uL   Hemoglobin 15.6 13.0 - 17.0 g/dL   HCT 45.4 38.5 - 50.0 %   MCV 96.2 80.0 - 100.0 fL   MCH 33.1 (H) 27.0 - 33.0 pg   MCHC 34.4 32.0 - 36.0 g/dL   RDW 13.4 11.0 - 15.0 %   Platelets 221 140 - 400 K/uL   MPV 9.4 7.5 - 12.5 fL   Neutro Abs 2,650 1,500 - 7,800 cells/uL   Lymphs Abs 1,450 850 - 3,900 cells/uL   Monocytes Absolute 650 200 - 950 cells/uL   Eosinophils Absolute 200 15 - 500 cells/uL   Basophils Absolute 50 0 - 200 cells/uL   Neutrophils Relative % 53 %   Lymphocytes Relative 29 %   Monocytes Relative 13 %   Eosinophils Relative 4 %   Basophils Relative 1 %   Smear Review Criteria for review  not met   PSA     Status: None   Collection Time: 04/06/17  8:25 AM  Result Value Ref Range   PSA 1.6 <=4.0 ng/mL    Comment:   The total PSA value from this assay system is standardized against the WHO standard. The test result will be approximately 20% lower when compared to the equimolar-standardized total PSA ([employer]). Comparison of serial PSA results should be interpreted with this fact in mind.   This test was performed using the Siemens chemiluminescent method. Values obtained from different assay methods cannot be used interchangeably. PSA levels, regardless of value, should not be interpreted as absolute evidence of the presence or absence of disease.       Immunization History  Administered Date(s) Administered  . Influenza Split 07/04/2012, 03/03/2013  . Influenza,inj,Quad PF,6+ Mos 05/03/2014, 04/30/2015, 04/03/2016, 04/08/2017  . Pneumococcal Conjugate-13 05/03/2014  . Pneumococcal Polysaccharide-23 08/03/2008, 04/03/2016  . Tdap 08/03/2010  . Zoster 08/03/2010    Past Medical History:  Diagnosis Date  . Arthritis    oa and ddd  . Back pain, chronic    pt states he has 3 herniated disks--uses fentanyl patch for pain  . Blood transfusion without reported diagnosis 1950   rH negative"at birth only"  . BPH (benign prostatic hyperplasia)    frequent urination, not able to hold urine well  . 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 Colon polyps   . Complication of anesthesia    pt had spinal for a knee replacement--"woke up" during surgery--and sick after surgery  . Depression   . Diverticulosis   . DVT (deep venous thrombosis) ([place]) 2001 or 2002   right leg-required hospitalization x 8 days  . DVT (deep venous thrombosis) ([place]) 1996   right  . GERD (gastroesophageal reflux disease)   . Glaucoma    both eyes  . H/O hiatal hernia   . Hearing impaired person, bilateral    bilateral  .  Hemorrhoid   . History of IBS   . History of  kidney stones    x 1-passed  . Hyperlipidemia 2012  . Lumbago   . Lumbar post-laminectomy syndrome   . Sleep apnea    pt uses cpap - setting of 16  . Ulcer 1966  . Ulcerative colitis ([place])   . Wears glasses   . Wears hearing aid    Past Surgical History:  Procedure Laterality Date  . APPENDECTOMY  1993  . BACK SURGERY  2008    bone spur removed   . bilateral carpal tunnel release 2009    . cervical fusion 2011--pt has good range of motion neck    . CHOLECYSTECTOMY  1994  . INSERTION OF MESH Bilateral 07/31/2016   Procedure: INSERTION OF MESH;  Surgeon: [name], MD;  Location: [facility];  Service: General;  Laterality: Bilateral;  . JOINT REPLACEMENT Bilateral    2001 left total knee and 1985 right total knee  . MASS EXCISION Left 01/04/2014   Procedure: LEFT MIDDLE FINGER MASS EXCISION WITH NAILBED REPAIR AND ADVANCEMENT FLAP;  Surgeon: [name], MD;  Location: [facility];  Service: Orthopedics;  Laterality: Left;  . mass removed from 2nd finger Left aug 2014   squamous carcinoma  . nissen fundoplication [id number]    . PROSTATE SURGERY  2014   TURP  . right wrist fusion 12/12 - pt wearing brace on the wrist-not started physical therapy yet Right    fusion prevent limited ROM to right wrist  . SPINE SURGERY  2009   cervical spine fusion  . TOTAL KNEE REVISION Right 11/17/2013   Procedure: RIGHT KNEE POLYETHYLENE REVISION, bone graft;  Surgeon: [name], MD;  Location: [facility];  Service: Orthopedics;  Laterality: Right;  . TRANSURETHRAL RESECTION OF PROSTATE  10/09/2011   Procedure: TRANSURETHRAL RESECTION OF THE PROSTATE WITH GYRUS INSTRUMENTS;  Surgeon: Fredricka Bonine, MD;  Location: [facility];  Service: Urology;  Laterality: N/A;  . uvvvp    . XI ROBOTIC ASSISTED INGUINAL HERNIA REPAIR WITH MESH Bilateral 07/31/2016   Procedure: XI ROBOTIC BILATERAL INGUINAL HERNIA REPAIR WITH MESH;  Surgeon: [name], MD;  Location: [facility];  Service:  General;  Laterality: Bilateral;   Current Outpatient Prescriptions on File Prior to Visit  Medication Sig Dispense Refill  . aspirin 81 MG tablet Take 81 mg by mouth daily.    . Calcium Carbonate-Vitamin D (CALCIUM + D PO) Take 1 tablet by mouth daily.    . dorzolamide-timolol (COSOPT) 22.3-6.8 MG/ML ophthalmic solution Place 1 drop into both eyes 2 (two) times daily.    . fentaNYL (DURAGESIC - DOSED MCG/HR) 25 MCG/HR Place 1 patch onto the skin every 3 (three) days. For chronic back pain    . fluticasone (FLONASE) 50 MCG/ACT nasal spray Place 1 spray into the nose every evening.     Marland Kitchen lisinopril (PRINIVIL,ZESTRIL) 20 MG tablet Take 1 tablet (20 mg total) by mouth daily. 90 tablet 3  . Multiple Vitamin (MULTIVITAMIN WITH MINERALS) TABS tablet Take 1 tablet by mouth daily.    . Omega-3 Fatty Acids (FISH OIL) 1200 MG CAPS Take 1 capsule by mouth daily.    . traZODone (DESYREL) 50 MG tablet Take 100 mg by mouth at bedtime as needed for sleep.     Marland Kitchen vortioxetine HBr (TRINTELLIX) 10 MG TABS Take 1 tablet (10 mg total) by mouth daily. 14 tablet 0   No current facility-administered medications on file prior to  visit.    Allergies  Allergen Reactions  . Other     "Adhesives"Pt allergic to some tapes--pt request that we only use paper tape!!   Social History   Social History  . Marital status: Single    Spouse name: N/A  . Number of children: N/A  . Years of education: N/A   Occupational History  . disabled    Social History Main Topics  . Smoking status: Former Smoker    Packs/day: 1.00    Years: 19.00    Types: Cigarettes    Quit date: 08/04/1983  . Smokeless tobacco: Never Used     Comment: quit smoking 1985  . Alcohol use 3.6 oz/week    6 Cans of beer per week     Comment: maybe 2 or 3 beers per week  . Drug use: No  . Sexual activity: Not Currently   Other Topics Concern  . Not on file   Social History Narrative  . No narrative on file   Family History  Problem  Relation Age of Onset  . Colon cancer Father   . Bone cancer Father        deceased  . Cancer Father   . Diabetes Father   . Prostate cancer Father   . Arthritis Mother   . Hearing loss Mother   . Hyperlipidemia Mother   . Hypertension Mother   . Deep vein thrombosis Mother   . Diabetes Sister   . Hearing loss Maternal Grandmother   . Diabetes Paternal Grandfather      Review of Systems  All other systems reviewed and are negative.      Objective:   Physical Exam  Constitutional: He is oriented to person, place, and time. He appears well-developed and well-nourished. No distress.  HENT:  Head: Normocephalic and atraumatic.  Right Ear: External ear normal.  Left Ear: External ear normal.  Nose: Nose normal.  Mouth/Throat: Oropharynx is clear and moist. No oropharyngeal exudate.  Eyes: Pupils are equal, round, and reactive to light. Conjunctivae and EOM are normal. Right eye exhibits no discharge. Left eye exhibits no discharge. No scleral icterus.  Neck: Normal range of motion. Neck supple. No JVD present. No tracheal deviation present. No thyromegaly present.  Cardiovascular: Normal rate, regular rhythm, normal heart sounds and intact distal pulses.  Exam reveals no gallop and no friction rub.   No murmur heard. Pulmonary/Chest: Effort normal and breath sounds normal. No stridor. No respiratory distress. He has no wheezes. He has no rales. He exhibits no tenderness.  Abdominal: Soft. Bowel sounds are normal. He exhibits no distension and no mass. There is no tenderness. There is no rebound and no guarding.  Musculoskeletal: Normal range of motion. He exhibits no edema, tenderness or deformity.  Lymphadenopathy:    He has no cervical adenopathy.  Neurological: He is alert and oriented to person, place, and time. He has normal reflexes. No cranial nerve deficit. He exhibits normal muscle tone. Coordination normal.  Skin: Skin is warm. No rash noted. He is not diaphoretic. No  erythema. No pallor.  Psychiatric: He has a normal mood and affect. His behavior is normal. Judgment and thought content normal.  Vitals reviewed.         Assessment & Plan:  Benign essential HTN  Need for immunization against influenza - Plan: Flu Vaccine QUAD 36+ mos IM  Routine general medical examination at a health care facility   I would like the patient's LDL cholesterol to be below  130. I have recommended a diet high in fruits and vegetables. I recommended less red meat, pork, fried foods, fast food. I recommended increasing aerobic exercise to 30 minutes a day 5 days a week. I recommended 5-10 pounds weight loss at least.  However I will also start the patient on Lipitor 20 mg a day and recheck fasting lab work in 3 months. He received his flu shot today. The remainder of his immunizations are up-to-date. Regular anticipatory guidance is provided. He will try daily antihistamine for nasal congestion and cough. If the cough does not improve, we will discontinue lisinopril. He'll also try daily probiotic for IBS. If the right lower quadrant abdominal pain persists, we can obtain a CT scan of the abdomen and pelvis to evaluate further

## 2017-04-20 DIAGNOSIS — M25511 Pain in right shoulder: Secondary | ICD-10-CM | POA: Diagnosis not present

## 2017-04-20 DIAGNOSIS — G8929 Other chronic pain: Secondary | ICD-10-CM | POA: Diagnosis not present

## 2017-04-20 DIAGNOSIS — M25512 Pain in left shoulder: Secondary | ICD-10-CM | POA: Diagnosis not present

## 2017-05-11 DIAGNOSIS — H401131 Primary open-angle glaucoma, bilateral, mild stage: Secondary | ICD-10-CM | POA: Diagnosis not present

## 2017-05-19 DIAGNOSIS — M5126 Other intervertebral disc displacement, lumbar region: Secondary | ICD-10-CM | POA: Diagnosis not present

## 2017-06-14 DIAGNOSIS — H401130 Primary open-angle glaucoma, bilateral, stage unspecified: Secondary | ICD-10-CM | POA: Diagnosis not present

## 2017-07-07 DIAGNOSIS — M25511 Pain in right shoulder: Secondary | ICD-10-CM | POA: Diagnosis not present

## 2017-07-07 DIAGNOSIS — M25512 Pain in left shoulder: Secondary | ICD-10-CM | POA: Diagnosis not present

## 2017-07-07 DIAGNOSIS — G8929 Other chronic pain: Secondary | ICD-10-CM | POA: Diagnosis not present

## 2017-07-20 DIAGNOSIS — H401131 Primary open-angle glaucoma, bilateral, mild stage: Secondary | ICD-10-CM | POA: Diagnosis not present

## 2017-08-25 ENCOUNTER — Encounter: Payer: Self-pay | Admitting: Family Medicine

## 2017-08-25 ENCOUNTER — Ambulatory Visit (INDEPENDENT_AMBULATORY_CARE_PROVIDER_SITE_OTHER): Payer: Medicare Other | Admitting: Family Medicine

## 2017-08-25 VITALS — BP 118/70 | HR 80 | Temp 98.0°F | Resp 20 | Wt 232.0 lb

## 2017-08-25 DIAGNOSIS — J209 Acute bronchitis, unspecified: Secondary | ICD-10-CM | POA: Diagnosis not present

## 2017-08-25 MED ORDER — ALBUTEROL SULFATE HFA 108 (90 BASE) MCG/ACT IN AERS: 2.0000 | INHALATION_SPRAY | RESPIRATORY_TRACT | 0 refills | Status: DC | PRN

## 2017-08-25 MED ORDER — PREDNISONE 20 MG PO TABS: 40.0000 mg | ORAL_TABLET | Freq: Every day | ORAL | 0 refills | Status: DC

## 2017-08-25 MED ORDER — AZITHROMYCIN 250 MG PO TABS: ORAL_TABLET | ORAL | 0 refills | Status: DC

## 2017-08-25 NOTE — Progress Notes (Signed)
   Subjective:    Patient ID: [name], male    DOB: [birth year], 67 y.o.   MRN: [id number]  Patient presents for Head & chest congestion, cough, ST, HA (Granddaughter has RSV and 6 days later he developed symptoms)   Last Thursday had tickle in throat, then started with progressive cough with production. Using CPAP for osa but having difficulty. No temp taken, but had chills and felt warm to touch yesterday. Resolved after tylenol Wheezing in chair.  No current smoking  Had some loose stools mixed Mucinex .   Grandaughter diagnosed with RSV and Otitis media, he was watching her   Review Of Systems:  GEN- denies fatigue, fever, weight loss,weakness, recent illness HEENT- denies eye drainage, change in vision, nasal discharge, CVS- denies chest pain, palpitations RESP- denies SOB, cough, wheeze ABD- denies N/V, change in stools, abd pain GU- denies dysuria, hematuria, dribbling, incontinence MSK- denies joint pain, muscle aches, injury Neuro- denies headache, dizziness, syncope, seizure activity       Objective:    BP 118/70   Pulse 80   Temp 98 F (36.7 C) (Oral)   Resp 20   Wt 232 lb (105.2 kg)   SpO2 97%   BMI 36.34 kg/m  GEN- NAD, alert and oriented x3 HEENT- PERRL, EOMI, non injected sclera, pink conjunctiva, MMM, oropharynx mild injection, TM clear bilat no effusion, no maxillary sinus tenderness,Nasal drainage  Neck- Supple, + shotty ant  LAD CVS- RRR, no murmur RESP- ratteling in lower lungs scattered, no wheeze, normal WOB, bronchitic cough  Abd, Soft,nt nabs EXT- No edema Pulses- Radial 2+         Assessment & Plan:      Problem List Items Addressed This Visit    None    Visit Diagnoses    Acute bronchitis, unspecified organism    -  Primary   progressive symptoms, started viralwith contact, now with bronchitis, zpak, robitussin DM, prednisone. CXR next week if not improved      Note: This dictation was prepared with Dragon dictation along  with smaller phrase technology. Any transcriptional errors that result from this process are unintentional.

## 2017-08-25 NOTE — Patient Instructions (Addendum)
F/U as needed  Call if not improved CXR will be done

## 2017-08-30 ENCOUNTER — Ambulatory Visit (HOSPITAL_COMMUNITY)
Admission: RE | Admit: 2017-08-30 | Discharge: 2017-08-30 | Disposition: A | Discharge status: Home / Self Care | Payer: Medicare Other | Source: Ambulatory Visit | Attending: Family Medicine | Admitting: Family Medicine

## 2017-08-30 ENCOUNTER — Encounter: Payer: Self-pay | Admitting: Family Medicine

## 2017-08-30 ENCOUNTER — Ambulatory Visit (INDEPENDENT_AMBULATORY_CARE_PROVIDER_SITE_OTHER): Payer: Medicare Other | Admitting: Family Medicine

## 2017-08-30 ENCOUNTER — Other Ambulatory Visit: Payer: Self-pay | Admitting: Family Medicine

## 2017-08-30 VITALS — BP 110/76 | HR 70 | Temp 98.1°F | Resp 18 | Ht 67.0 in | Wt 234.0 lb

## 2017-08-30 DIAGNOSIS — J181 Lobar pneumonia, unspecified organism: Secondary | ICD-10-CM | POA: Diagnosis not present

## 2017-08-30 DIAGNOSIS — R059 Cough, unspecified: Secondary | ICD-10-CM

## 2017-08-30 DIAGNOSIS — R05 Cough: Secondary | ICD-10-CM | POA: Diagnosis not present

## 2017-08-30 DIAGNOSIS — R0602 Shortness of breath: Secondary | ICD-10-CM | POA: Diagnosis not present

## 2017-08-30 DIAGNOSIS — J189 Pneumonia, unspecified organism: Secondary | ICD-10-CM

## 2017-08-30 MED ORDER — LEVOFLOXACIN 500 MG PO TABS: 500.0000 mg | ORAL_TABLET | Freq: Every day | ORAL | 0 refills | Status: DC

## 2017-08-30 NOTE — Progress Notes (Signed)
Subjective:    Patient ID: [name], male    DOB: [birth year], 67 y.o.   MRN: [id number]  HPI Patient was seen by my partner last week and was diagnosed with bronchitis and was started on a Z-Pak. He is here today for follow-up. Cough is worsening. Chest x-ray was obtained this morning. Official report has not yet returned. However I reviewed the chest x-ray myself. It appears like there may be a right lower lobe infiltrate forming.  Patient states that his cough is getting worse. He is unable to bring up the congestion that he feels deep in his lungs. He reports some shortness of breath. He coughs so hard yesterday that he almost passed out. He reports fevers and chills. He is also complaining of sinus pressure and postnasal drip. Past Medical History:  Diagnosis Date  . Arthritis    oa and ddd  . Back pain, chronic    pt states he has 3 herniated disks--uses fentanyl patch for pain  . Blood transfusion without reported diagnosis 1950   rH negative"at birth only"  . BPH (benign prostatic hyperplasia)    frequent urination, not able to hold urine well  . 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 Colon polyps   . Complication of anesthesia    pt had spinal for a knee replacement--"woke up" during surgery--and sick after surgery  . Depression   . Diverticulosis   . DVT (deep venous thrombosis) ([place]) 2001 or 2002   right leg-required hospitalization x 8 days  . DVT (deep venous thrombosis) ([place]) 1996   right  . GERD (gastroesophageal reflux disease)   . Glaucoma    both eyes  . H/O hiatal hernia   . Hearing impaired person, bilateral    bilateral  . Hemorrhoid   . History of IBS   . History of kidney stones    x 1-passed  . Hyperlipidemia 2012  . Lumbago   . Lumbar post-laminectomy syndrome   . Sleep apnea    pt uses cpap - setting of 16  . Ulcer 1966  . Ulcerative colitis ([place])   . Wears glasses   . Wears hearing aid    Past Surgical History:  Procedure  Laterality Date  . APPENDECTOMY  1993  . BACK SURGERY  2008    bone spur removed   . bilateral carpal tunnel release 2009    . cervical fusion 2011--pt has good range of motion neck    . CHOLECYSTECTOMY  1994  . INSERTION OF MESH Bilateral 07/31/2016   Procedure: INSERTION OF MESH;  Surgeon: [name], MD;  [facility];  Service: General;  Laterality: Bilateral;  . JOINT REPLACEMENT Bilateral    2001 left total knee and 1985 right total knee  . MASS EXCISION Left 01/04/2014   Procedure: LEFT MIDDLE FINGER MASS EXCISION WITH NAILBED REPAIR AND ADVANCEMENT FLAP;  Surgeon: [name], MD;  Location: [facility];  Service: Orthopedics;  Laterality: Left;  . mass removed from 2nd finger Left aug 2014   squamous carcinoma  . nissen fundoplication [id number]    . PROSTATE SURGERY  2014   TURP  . right wrist fusion 12/12 - pt wearing brace on the wrist-not started physical therapy yet Right    fusion prevent limited ROM to right wrist  . SPINE SURGERY  2009   cervical spine fusion  . TOTAL KNEE REVISION Right 11/17/2013   Procedure: RIGHT KNEE POLYETHYLENE REVISION, bone  graft;  Surgeon: [name], MD;  [facility];  Service: Orthopedics;  Laterality: Right;  . TRANSURETHRAL RESECTION OF PROSTATE  10/09/2011   Procedure: TRANSURETHRAL RESECTION OF THE PROSTATE WITH GYRUS INSTRUMENTS;  Surgeon: Fredricka Bonine, MD;  [facility];  Service: Urology;  Laterality: N/A;  . uvvvp    . XI ROBOTIC ASSISTED INGUINAL HERNIA REPAIR WITH MESH Bilateral 07/31/2016   Procedure: XI ROBOTIC BILATERAL INGUINAL HERNIA REPAIR WITH MESH;  Surgeon: [name], MD;  [facility];  Service: General;  Laterality: Bilateral;   Current Outpatient Medications on File Prior to Visit  Medication Sig Dispense Refill  . albuterol (PROVENTIL HFA;VENTOLIN HFA) 108 (90 Base) MCG/ACT inhaler Inhale 2 puffs into the lungs every 4 (four) hours as needed for wheezing or shortness of  breath. 1 Inhaler 0  . aspirin 81 MG tablet Take 81 mg by mouth daily.    Marland Kitchen atorvastatin (LIPITOR) 40 MG tablet Take 1 tablet (40 mg total) by mouth daily. 90 tablet 3  . Calcium Carbonate-Vitamin D (CALCIUM + D PO) Take 1 tablet by mouth daily.    . dorzolamide-timolol (COSOPT) 22.3-6.8 MG/ML ophthalmic solution Place 1 drop into both eyes 2 (two) times daily.    . fentaNYL (DURAGESIC - DOSED MCG/HR) 25 MCG/HR Place 1 patch onto the skin every 3 (three) days. For chronic back pain    . fluticasone (FLONASE) 50 MCG/ACT nasal spray Place 1 spray into the nose every evening.     Marland Kitchen lisinopril (PRINIVIL,ZESTRIL) 20 MG tablet Take 1 tablet (20 mg total) by mouth daily. 90 tablet 3  . Multiple Vitamin (MULTIVITAMIN WITH MINERALS) TABS tablet Take 1 tablet by mouth daily.    . Omega-3 Fatty Acids (FISH OIL) 1200 MG CAPS Take 1 capsule by mouth daily.    . traZODone (DESYREL) 50 MG tablet Take 100 mg by mouth at bedtime as needed for sleep.     Marland Kitchen vortioxetine HBr (TRINTELLIX) 10 MG TABS Take 1 tablet (10 mg total) by mouth daily. 14 tablet 0   No current facility-administered medications on file prior to visit.    Allergies  Allergen Reactions  . Other     "Adhesives"Pt allergic to some tapes--pt request that we only use paper tape!!   Social History   Socioeconomic History  . Marital status: Single    Spouse name: Not on file  . Number of children: Not on file  . Years of education: Not on file  . Highest education level: Not on file  Social Needs  . Financial resource strain: Not on file  . Food insecurity - worry: Not on file  . Food insecurity - inability: Not on file  . Transportation needs - medical: Not on file  . Transportation needs - non-medical: Not on file  Occupational History  . Occupation: disabled  Tobacco Use  . Smoking status: Former Smoker    Packs/day: 1.00    Years: 19.00    Pack years: 19.00    Types: Cigarettes    Last attempt to quit: 08/04/1983    Years since  quitting: 34.0  . Smokeless tobacco: Never Used  . Tobacco comment: quit smoking 1985  Substance and Sexual Activity  . Alcohol use: Yes    Alcohol/week: 3.6 oz    Types: 6 Cans of beer per week    Comment: maybe 2 or 3 beers per week  . Drug use: No  . Sexual activity: Not Currently  Other Topics Concern  .  Not on file  Social History Narrative  . Not on file      Review of Systems  All other systems reviewed and are negative.      Objective:   Physical Exam  Constitutional: He appears well-developed and well-nourished. No distress.  HENT:  Right Ear: External ear normal.  Left Ear: External ear normal.  Nose: Nose normal.  Mouth/Throat: Oropharynx is clear and moist. No oropharyngeal exudate.  Eyes: Conjunctivae are normal.  Neck: Neck supple. No JVD present.  Cardiovascular: Normal rate, regular rhythm and normal heart sounds.  No murmur heard. Pulmonary/Chest: Effort normal and breath sounds normal. No respiratory distress. He has no wheezes. He has no rales.  Abdominal: Soft. Bowel sounds are normal.  Lymphadenopathy:    He has no cervical adenopathy.  Skin: He is not diaphoretic.  Vitals reviewed.         Assessment & Plan:  I'm going to treat the patient for possible unit acquired pneumonia in the right lower lung and also sinusitis with Levaquin 500 mg by mouth daily for 7 days. Recheck in one week or sooner if getting worse

## 2017-09-10 ENCOUNTER — Telehealth: Payer: Self-pay | Admitting: Pulmonary Disease

## 2017-09-10 NOTE — Telephone Encounter (Signed)
Left voice mail on machine for patient to return phone call back regarding fax rec'd by [facility] in need of CPAP supplies. Patient has not been seen in over one year, needing a appt with [facility] in order to refill supplies. X1

## 2017-09-13 ENCOUNTER — Telehealth: Payer: Self-pay | Admitting: Pulmonary Disease

## 2017-09-13 DIAGNOSIS — H401131 Primary open-angle glaucoma, bilateral, mild stage: Secondary | ICD-10-CM | POA: Diagnosis not present

## 2017-09-13 NOTE — Telephone Encounter (Signed)
Nothing further needed 

## 2017-09-13 NOTE — Telephone Encounter (Signed)
See 2/11 phone note.

## 2017-09-17 ENCOUNTER — Encounter: Payer: Self-pay | Admitting: Pulmonary Disease

## 2017-09-17 ENCOUNTER — Ambulatory Visit (INDEPENDENT_AMBULATORY_CARE_PROVIDER_SITE_OTHER): Payer: Medicare Other | Admitting: Pulmonary Disease

## 2017-09-17 VITALS — BP 122/60 | HR 72 | Ht 67.0 in | Wt 236.0 lb

## 2017-09-17 DIAGNOSIS — Z9989 Dependence on other enabling machines and devices: Secondary | ICD-10-CM | POA: Diagnosis not present

## 2017-09-17 DIAGNOSIS — G4733 Obstructive sleep apnea (adult) (pediatric): Secondary | ICD-10-CM | POA: Diagnosis not present

## 2017-09-17 NOTE — Patient Instructions (Signed)
Follow up in 1 year.

## 2017-09-17 NOTE — Progress Notes (Signed)
[facility]  Chief Complaint  Patient presents with  . Follow-up    Pt doing well overall with cpap machine.    Vital signs: BP 122/60 (BP Location: Left Arm, Cuff Size: Normal)   Pulse 72   Ht 5' 7"  (1.702 m)   Wt 236 lb (107 kg)   SpO2 94%   BMI 36.96 kg/m   History of Present Illness: [name] is a 67 y.o. male obstructive sleep apnea.  He got a new machine about 1 year ago.  This is working well.  Has nasal mask.  No issues with mask fit.  Can't sleep w/o CPAP.  Doesn't nap.  Was tx for pneumonia last month.  Slowly improving.  Physical Exam:  General - pleasant Eyes - pupils reactive, wears glasses ENT - no sinus tenderness, no oral exudate, no LAN, MP 3 Cardiac - regular, no murmur Chest - no wheeze, rales Abd - soft, non tender Ext - no edema Skin - no rashes Neuro - normal strength Psych - normal mood   Assessment/Plan:  Obstructive sleep apnea. - he is compliant with CPAP and reports benefit from therapy - continue auto CPAP   Patient Instructions  Follow up in 1 year    Chesley Mires, MD Rew 09/17/2017, 9:53 AM Pager:  [phone]  Flow Sheet  Sleep tests: PSG 12/21/91 >> AHI 56 Auto CPAP 08/17/17 to 09/15/17 >> used on 30 of 30 nights with average 7 hrs 21 min.  Average AHI 6.5 with median CPAP 11 and 95 th CPAP 13 cm H2O  Past Medical History: He  has a past medical history of Arthritis, Back pain, chronic, Blood transfusion without reported diagnosis (1950), BPH (benign prostatic hyperplasia), Cancer ([place]), Colon polyps, Complication of anesthesia, Depression, Diverticulosis, DVT (deep venous thrombosis) ([place]) (2001 or 2002), DVT (deep venous thrombosis) ([place]) (1996), GERD (gastroesophageal reflux disease), Glaucoma, H/O hiatal hernia, Hearing impaired person, bilateral, Hemorrhoid, History of IBS, History of kidney stones, Hyperlipidemia (2012), Lumbago, Lumbar  post-laminectomy syndrome, Sleep apnea, Ulcer (1966), Ulcerative colitis ([place]), Wears glasses, and Wears hearing aid.  Past Surgical History: He  has a past surgical history that includes right wrist fusion 12/12 - pt wearing brace on the wrist-not started physical therapy yet (Right); cervical fusion 2011--pt has good range of motion neck; bilateral carpal tunnel release 2009; Back surgery (2008 ); nissen fundoplication 9833; Cholecystectomy (1994); Appendectomy (1993); Transurethral resection of prostate (10/09/2011); Prostate surgery (2014); Spine surgery (2009); mass removed from 2nd finger (Left, aug 2014); uvvvp; Total knee revision (Right, 11/17/2013); Mass excision (Left, 01/04/2014); Joint replacement (Bilateral); XI Robotic assisted inguinal hernia repair with mesh (Bilateral, 07/31/2016); and Insertion of mesh (Bilateral, 07/31/2016).  Family History: His family history includes Arthritis in his mother; Bone cancer in his father; Cancer in his father; Colon cancer in his father; Deep vein thrombosis in his mother; Diabetes in his father, paternal grandfather, and sister; Hearing loss in his maternal grandmother and mother; Hyperlipidemia in his mother; Hypertension in his mother; Prostate cancer in his father.  Social History: He  reports that he quit smoking about 34 years ago. His smoking use included cigarettes. He has a 19.00 pack-year smoking history. he has never used smokeless tobacco. He reports that he drinks about 3.6 oz of alcohol per week. He reports that he does not use drugs.  Medications: Allergies as of 09/17/2017      Reactions   Other    "Adhesives"Pt allergic to some  tapes--pt request that we only use paper tape!!      Medication List        Accurate as of 09/17/17  9:53 AM. Always use your most recent med list.          albuterol 108 (90 Base) MCG/ACT inhaler Commonly known as:  PROVENTIL HFA;VENTOLIN HFA Inhale 2 puffs into the lungs every 4 (four) hours as needed  for wheezing or shortness of breath.   aspirin 81 MG tablet Take 81 mg by mouth daily.   atorvastatin 40 MG tablet Commonly known as:  LIPITOR Take 1 tablet (40 mg total) by mouth daily.   CALCIUM + D PO Take 1 tablet by mouth daily.   dorzolamide-timolol 22.3-6.8 MG/ML ophthalmic solution Commonly known as:  COSOPT Place 1 drop into both eyes 2 (two) times daily.   fentaNYL 25 MCG/HR patch Commonly known as:  Craig - dosed mcg/hr Place 1 patch onto the skin every 3 (three) days. For chronic back pain   Fish Oil 1200 MG Caps Take 1 capsule by mouth daily.   fluticasone 50 MCG/ACT nasal spray Commonly known as:  FLONASE Place 1 spray into the nose every evening.   lisinopril 20 MG tablet Commonly known as:  PRINIVIL,ZESTRIL Take 1 tablet (20 mg total) by mouth daily.   multivitamin with minerals Tabs tablet Take 1 tablet by mouth daily.   traZODone 50 MG tablet Commonly known as:  DESYREL Take 100 mg by mouth at bedtime as needed for sleep.   vortioxetine HBr 10 MG Tabs Commonly known as:  TRINTELLIX Take 1 tablet (10 mg total) by mouth daily.

## 2017-09-23 DIAGNOSIS — M961 Postlaminectomy syndrome, not elsewhere classified: Secondary | ICD-10-CM | POA: Diagnosis not present

## 2017-09-23 DIAGNOSIS — M5126 Other intervertebral disc displacement, lumbar region: Secondary | ICD-10-CM | POA: Diagnosis not present

## 2017-10-13 DIAGNOSIS — M19012 Primary osteoarthritis, left shoulder: Secondary | ICD-10-CM | POA: Diagnosis not present

## 2017-10-13 DIAGNOSIS — M19011 Primary osteoarthritis, right shoulder: Secondary | ICD-10-CM | POA: Diagnosis not present

## 2017-10-22 ENCOUNTER — Other Ambulatory Visit: Payer: Self-pay | Admitting: Family Medicine

## 2017-11-05 DIAGNOSIS — H401131 Primary open-angle glaucoma, bilateral, mild stage: Secondary | ICD-10-CM | POA: Diagnosis not present

## 2017-12-02 DIAGNOSIS — Z79891 Long term (current) use of opiate analgesic: Secondary | ICD-10-CM | POA: Diagnosis not present

## 2017-12-02 DIAGNOSIS — G894 Chronic pain syndrome: Secondary | ICD-10-CM | POA: Diagnosis not present

## 2017-12-02 DIAGNOSIS — G4700 Insomnia, unspecified: Secondary | ICD-10-CM | POA: Diagnosis not present

## 2018-01-03 DIAGNOSIS — B079 Viral wart, unspecified: Secondary | ICD-10-CM | POA: Diagnosis not present

## 2018-01-03 DIAGNOSIS — M1812 Unilateral primary osteoarthritis of first carpometacarpal joint, left hand: Secondary | ICD-10-CM | POA: Diagnosis not present

## 2018-01-03 DIAGNOSIS — L608 Other nail disorders: Secondary | ICD-10-CM | POA: Diagnosis not present

## 2018-01-09 ENCOUNTER — Emergency Department: Admission: EM | Admit: 2018-01-09 | Payer: Medicare Other | Source: Home / Self Care

## 2018-01-10 ENCOUNTER — Other Ambulatory Visit: Payer: Self-pay | Admitting: Orthopedic Surgery

## 2018-01-10 DIAGNOSIS — Z4789 Encounter for other orthopedic aftercare: Secondary | ICD-10-CM | POA: Diagnosis not present

## 2018-01-10 DIAGNOSIS — L608 Other nail disorders: Secondary | ICD-10-CM | POA: Diagnosis not present

## 2018-01-10 DIAGNOSIS — L578 Other skin changes due to chronic exposure to nonionizing radiation: Secondary | ICD-10-CM | POA: Diagnosis not present

## 2018-01-10 NOTE — H&P (Signed)
[name] is an 67 y.o. male.    Chief Complaint: right shoulder pain  HPI: Pt is a 67 y.o. male complaining of right shoulder pain for multiple years. Pain had continually increased since the beginning. X-rays in the clinic show end-stage arthritic changes of the right shoulder. Pt has tried various conservative treatments which have failed to alleviate their symptoms, including injections and therapy. Various options are discussed with the patient. Risks, benefits and expectations were discussed with the patient. Patient understand the risks, benefits and expectations and wishes to proceed with surgery.   PCP:  [name], MD  D/C Plans: Home  PMH: Past Medical History:  Diagnosis Date  . Arthritis    oa and ddd  . Back pain, chronic    pt states he has 3 herniated disks--uses fentanyl patch for pain  . Blood transfusion without reported diagnosis 1950   rH negative"at birth only"  . BPH (benign prostatic hyperplasia)    frequent urination, not able to hold urine well  . 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 Colon polyps   . Complication of anesthesia    pt had spinal for a knee replacement--"woke up" during surgery--and sick after surgery  . Depression   . Diverticulosis   . DVT (deep venous thrombosis) ([place]) 2001 or 2002   right leg-required hospitalization x 8 days  . DVT (deep venous thrombosis) ([place]) 1996   right  . GERD (gastroesophageal reflux disease)   . Glaucoma    both eyes  . H/O hiatal hernia   . Hearing impaired person, bilateral    bilateral  . Hemorrhoid   . History of IBS   . History of kidney stones    x 1-passed  . Hyperlipidemia 2012  . Lumbago   . Lumbar post-laminectomy syndrome   . Sleep apnea    pt uses cpap - setting of 16  . Ulcer 1966  . Ulcerative colitis ([place])   . Wears glasses   . Wears hearing aid     PSH: Past Surgical History:  Procedure Laterality Date  . APPENDECTOMY  1993  . BACK SURGERY   2008    bone spur removed   . bilateral carpal tunnel release 2009    . cervical fusion 2011--pt has good range of motion neck    . CHOLECYSTECTOMY  1994  . INSERTION OF MESH Bilateral 07/31/2016   Procedure: INSERTION OF MESH;  Surgeon: [name], MD;  Location: [facility];  Service: General;  Laterality: Bilateral;  . JOINT REPLACEMENT Bilateral    2001 left total knee and 1985 right total knee  . MASS EXCISION Left 01/04/2014   Procedure: LEFT MIDDLE FINGER MASS EXCISION WITH NAILBED REPAIR AND ADVANCEMENT FLAP;  Surgeon: [name], MD;  Location: [facility];  Service: Orthopedics;  Laterality: Left;  . mass removed from 2nd finger Left aug 2014   squamous carcinoma  . nissen fundoplication [id number]    . PROSTATE SURGERY  2014   TURP  . right wrist fusion 12/12 - pt wearing brace on the wrist-not started physical therapy yet Right    fusion prevent limited ROM to right wrist  . SPINE SURGERY  2009   cervical spine fusion  . TOTAL KNEE REVISION Right 11/17/2013   Procedure: RIGHT KNEE POLYETHYLENE REVISION, bone graft;  Surgeon: [name], MD;  Location: [facility];  Service: Orthopedics;  Laterality: Right;  . TRANSURETHRAL RESECTION OF PROSTATE  10/09/2011   Procedure: TRANSURETHRAL RESECTION OF THE PROSTATE WITH GYRUS INSTRUMENTS;  Surgeon: Fredricka Bonine, MD;  Location: [facility];  Service: Urology;  Laterality: N/A;  . uvvvp    . XI ROBOTIC ASSISTED INGUINAL HERNIA REPAIR WITH MESH Bilateral 07/31/2016   Procedure: XI ROBOTIC BILATERAL INGUINAL HERNIA REPAIR WITH MESH;  Surgeon: [name], MD;  Location: [facility];  Service: General;  Laterality: Bilateral;    Social History:  reports that he quit smoking about 34 years ago. His smoking use included cigarettes. He has a 19.00 pack-year smoking history. He has never used smokeless tobacco. He reports that he drinks about 3.6 oz of alcohol per week. He reports that he does not use drugs.  Allergies:    Allergies  Allergen Reactions  . Other     "Adhesives"Pt allergic to some tapes--pt request that we only use paper tape!!    Medications: No current facility-administered medications for this encounter.    Current Outpatient Medications  Medication Sig Dispense Refill  . albuterol (PROVENTIL HFA;VENTOLIN HFA) 108 (90 Base) MCG/ACT inhaler Inhale 2 puffs into the lungs every 4 (four) hours as needed for wheezing or shortness of breath. 1 Inhaler 0  . aspirin 81 MG tablet Take 81 mg by mouth daily.    Marland Kitchen atorvastatin (LIPITOR) 40 MG tablet Take 1 tablet (40 mg total) by mouth daily. 90 tablet 3  . Calcium Carbonate-Vitamin D (CALCIUM + D PO) Take 1 tablet by mouth daily.    . dorzolamide-timolol (COSOPT) 22.3-6.8 MG/ML ophthalmic solution Place 1 drop into both eyes 2 (two) times daily.    . fentaNYL (DURAGESIC - DOSED MCG/HR) 25 MCG/HR Place 1 patch onto the skin every 3 (three) days. For chronic back pain    . fluticasone (FLONASE) 50 MCG/ACT nasal spray Place 1 spray into the nose every evening.     Marland Kitchen lisinopril (PRINIVIL,ZESTRIL) 20 MG tablet TAKE 1 TABLET BY MOUTH ONCE DAILY 90 tablet 3  . Multiple Vitamin (MULTIVITAMIN WITH MINERALS) TABS tablet Take 1 tablet by mouth daily.    . Omega-3 Fatty Acids (FISH OIL) 1200 MG CAPS Take 1 capsule by mouth daily.    . traZODone (DESYREL) 50 MG tablet Take 100 mg by mouth at bedtime as needed for sleep.     Marland Kitchen vortioxetine HBr (TRINTELLIX) 10 MG TABS Take 1 tablet (10 mg total) by mouth daily. 14 tablet 0    No results found for this or any previous visit (from the past 48 hour(s)). No results found.  ROS: Pain with rom of the right upper extremity  Physical Exam:  Alert and oriented 67 y.o. male in no acute distress Cranial nerves 2-12 intact Cervical spine: full rom with no tenderness, nv intact distally Chest: active breath sounds bilaterally, no wheeze rhonchi or rales Heart: regular rate and rhythm, no murmur Abd: non tender non  distended with active bowel sounds Hip is stable with rom  Right shoulder crepitus with rom nv intact distally No rashes or edema Strength 4/5 with ER and IR  Assessment/Plan Assessment: right shoulder end stage osteoarthritis  Plan: Patient will undergo a right total shoulder arthroplasty by Dr. Veverly Fells at [facility]. Risks benefits and expectations were discussed with the patient. Patient understand risks, benefits and expectations and wishes to proceed.  Merla Riches PA-C, MPAS [facility] is now [employer] [address], [place], [place], [place] [address] Phone: [phone] [email or web address] Facebook  [employer]

## 2018-01-17 NOTE — Pre-Procedure Instructions (Signed)
[name]  01/17/2018      [facility], [place] - [address] [place] [address] Phone: [phone] Fax: [phone]    Your procedure is scheduled on June 28  Report to [facility] at [place].M.  Call this number if you have problems the morning of surgery:  [phone]   Remember:  NOTHING TO EAT OR DRINK AFTER MIDNIGHT    Take these medicines the morning of surgery with A SIP OF WATER  albuterol (PROVENTIL HFA;VENTOLIN HFA)  cephALEXin (KEFLEX) EYE DROPS IF NEEDED HYDROcodone-acetaminophen (NORCO/VICODIN)  vortioxetine HBr (TRINTELLIX)  7 days prior to surgery STOP taking any Aspirin(unless otherwise instructed by your surgeon), Aleve, Naproxen, Ibuprofen, Motrin, Advil, Goody's, BC's, all herbal medications, fish oil, and all vitamins  Follow your doctors instructions regarding your Aspirin.  If no instructions were given by your doctor, then you will need to call the prescribing office office to get instructions.       Do not wear jewelry  Do not wear lotions, powders, or cologne, or deodorant.   Men may shave face and neck.  Do not bring valuables to the hospital.  [facility] is not responsible for any belongings or valuables.  Contacts, dentures or bridgework may not be worn into surgery.  Leave your suitcase in the car.  After surgery it may be brought to your room.  For patients admitted to the hospital, discharge time will be determined by your treatment team.  Patients discharged the day of surgery will not be allowed to drive home.    Special instructions:   [facility]- Preparing For Surgery  Before surgery, you can play an important role. Because skin is not sterile, your skin needs to be as free of germs as possible. You can reduce the number of germs on your skin by washing with CHG (chlorahexidine gluconate) Soap before surgery.  CHG is an antiseptic cleaner which kills germs  and bonds with the skin to continue killing germs even after washing.    Oral Hygiene is also important to reduce your risk of infection.  Remember - BRUSH YOUR TEETH THE MORNING OF SURGERY WITH YOUR REGULAR TOOTHPASTE  Please do not use if you have an allergy to CHG or antibacterial soaps. If your skin becomes reddened/irritated stop using the CHG.  Do not shave (including legs and underarms) for at least 48 hours prior to first CHG shower. It is OK to shave your face.  Please follow these instructions carefully.   1. Shower the NIGHT BEFORE SURGERY and the MORNING OF SURGERY with CHG.   2. If you chose to wash your hair, wash your hair first as usual with your normal shampoo.  3. After you shampoo, rinse your hair and body thoroughly to remove the shampoo.  4. Use CHG as you would any other liquid soap. You can apply CHG directly to the skin and wash gently with a scrungie or a clean washcloth.   5. Apply the CHG Soap to your body ONLY FROM THE NECK DOWN.  Do not use on open wounds or open sores. Avoid contact with your eyes, ears, mouth and genitals (private parts). Wash Face and genitals (private parts)  with your normal soap.  6. Wash thoroughly, paying special attention to the area where your surgery will be performed.  7. Thoroughly rinse your body with warm water from the neck down.  8. DO NOT shower/wash with  your normal soap after using and rinsing off the CHG Soap.  9. Pat yourself dry with a CLEAN TOWEL.  10. Wear CLEAN PAJAMAS to bed the night before surgery, wear comfortable clothes the morning of surgery  11. Place CLEAN SHEETS on your bed the night of your first shower and DO NOT SLEEP WITH PETS.    Day of Surgery:  Do not apply any deodorants/lotions.  Please wear clean clothes to the hospital/surgery center.   Remember to brush your teeth WITH YOUR REGULAR TOOTHPASTE.    Please read over the following fact sheets that you were given.

## 2018-01-18 ENCOUNTER — Other Ambulatory Visit: Payer: Self-pay

## 2018-01-18 ENCOUNTER — Encounter (HOSPITAL_COMMUNITY)
Admission: RE | Admit: 2018-01-18 | Discharge: 2018-01-18 | Disposition: A | Discharge status: Home / Self Care | Payer: Medicare Other | Source: Ambulatory Visit | Attending: Orthopedic Surgery | Admitting: Orthopedic Surgery

## 2018-01-18 ENCOUNTER — Encounter (HOSPITAL_COMMUNITY): Payer: Self-pay

## 2018-01-18 DIAGNOSIS — Z01812 Encounter for preprocedural laboratory examination: Secondary | ICD-10-CM | POA: Diagnosis not present

## 2018-01-18 DIAGNOSIS — Z01810 Encounter for preprocedural cardiovascular examination: Secondary | ICD-10-CM | POA: Diagnosis not present

## 2018-01-18 HISTORY — DX: Essential (primary) hypertension: I10

## 2018-01-18 HISTORY — DX: Other specified postprocedural states: Z98.890

## 2018-01-18 HISTORY — DX: Other specified postprocedural states: R11.2

## 2018-01-18 LAB — CBC
HEMATOCRIT: 44.8 % (ref 39.0–52.0)
Hemoglobin: 15.4 g/dL (ref 13.0–17.0)
MCH: 32.2 pg (ref 26.0–34.0)
MCHC: 34.4 g/dL (ref 30.0–36.0)
MCV: 93.7 fL (ref 78.0–100.0)
Platelets: 170 10*3/uL (ref 150–400)
RBC: 4.78 MIL/uL (ref 4.22–5.81)
RDW: 12.2 % (ref 11.5–15.5)
WBC: 6.1 10*3/uL (ref 4.0–10.5)

## 2018-01-18 LAB — BASIC METABOLIC PANEL
Anion gap: 7 (ref 5–15)
BUN: 9 mg/dL (ref 6–20)
CHLORIDE: 109 mmol/L (ref 101–111)
CO2: 26 mmol/L (ref 22–32)
Calcium: 9.4 mg/dL (ref 8.9–10.3)
Creatinine, Ser: 1.03 mg/dL (ref 0.61–1.24)
GFR calc Af Amer: >60 mL/min (ref >60)
GFR calc non Af Amer: >60 mL/min (ref >60)
Glucose, Bld: 103 mg/dL — ABNORMAL HIGH (ref 65–99)
POTASSIUM: 4.6 mmol/L (ref 3.5–5.1)
SODIUM: 142 mmol/L (ref 135–145)

## 2018-01-18 LAB — SURGICAL PCR SCREEN
MRSA, PCR: NEGATIVE
Staphylococcus aureus: NEGATIVE

## 2018-01-18 NOTE — Pre-Procedure Instructions (Signed)
[name]  01/18/2018      [facility], [place] - [address] [place] [address] Phone: [phone] Fax: [phone]    Your procedure is scheduled on June 28  Report to [facility] at [place].M.  Call this number if you have problems the morning of surgery:  [phone]   Remember:  NOTHING TO EAT OR DRINK AFTER MIDNIGHT    Take these medicines the morning of surgery with A SIP OF WATER  albuterol (PROVENTIL HFA;VENTOLIN HFA)  cephALEXin (KEFLEX) EYE DROPS IF NEEDED HYDROcodone-acetaminophen (NORCO/VICODIN)   7 days prior to surgery STOP taking any Aspirin(unless otherwise instructed by your surgeon), Aleve, Naproxen, Ibuprofen, Motrin, Advil, Goody's, BC's, all herbal medications, fish oil, and all vitamins  Follow your doctors instructions regarding your Aspirin.  If no instructions were given by your doctor, then you will need to call the prescribing office office to get instructions.       Do not wear jewelry  Do not wear lotions, powders, or cologne, or deodorant.   Men may shave face and neck.  Do not bring valuables to the hospital.  [facility] is not responsible for any belongings or valuables.  Contacts, dentures or bridgework may not be worn into surgery.  Leave your suitcase in the car.  After surgery it may be brought to your room.  For patients admitted to the hospital, discharge time will be determined by your treatment team.  Patients discharged the day of surgery will not be allowed to drive home.    Special instructions:   - Preparing For Surgery  Before surgery, you can play an important role. Because skin is not sterile, your skin needs to be as free of germs as possible. You can reduce the number of germs on your skin by washing with CHG (chlorahexidine gluconate) Soap before surgery.  CHG is an antiseptic cleaner which kills germs and bonds with the skin to  continue killing germs even after washing.    Oral Hygiene is also important to reduce your risk of infection.  Remember - BRUSH YOUR TEETH THE MORNING OF SURGERY WITH YOUR REGULAR TOOTHPASTE  Please do not use if you have an allergy to CHG or antibacterial soaps. If your skin becomes reddened/irritated stop using the CHG.  Do not shave (including legs and underarms) for at least 48 hours prior to first CHG shower. It is OK to shave your face.  Please follow these instructions carefully.   1. Shower the NIGHT BEFORE SURGERY and the MORNING OF SURGERY with CHG.   2. If you chose to wash your hair, wash your hair first as usual with your normal shampoo.  3. After you shampoo, rinse your hair and body thoroughly to remove the shampoo.  4. Use CHG as you would any other liquid soap. You can apply CHG directly to the skin and wash gently with a scrungie or a clean washcloth.   5. Apply the CHG Soap to your body ONLY FROM THE NECK DOWN.  Do not use on open wounds or open sores. Avoid contact with your eyes, ears, mouth and genitals (private parts). Wash Face and genitals (private parts)  with your normal soap.  6. Wash thoroughly, paying special attention to the area where your surgery will be performed.  7. Thoroughly rinse your body with warm water from the neck down.  8. DO NOT shower/wash with your normal soap  after using and rinsing off the CHG Soap.  9. Pat yourself dry with a CLEAN TOWEL.  10. Wear CLEAN PAJAMAS to bed the night before surgery, wear comfortable clothes the morning of surgery  11. Place CLEAN SHEETS on your bed the night of your first shower and DO NOT SLEEP WITH PETS.    Day of Surgery:  Do not apply any deodorants/lotions.  Please wear clean clothes to the hospital/surgery center.   Remember to brush your teeth WITH YOUR REGULAR TOOTHPASTE.    Please read over the following fact sheets that you were given.

## 2018-01-18 NOTE — Progress Notes (Signed)
PCP Jenna Luo MD  Stress Test 2013  Has seen Dr. Daneen Schick Cardiologist in the past,but does not see on a regular basis.  Denies any recent chest pain or discomfort

## 2018-01-24 DIAGNOSIS — H1045 Other chronic allergic conjunctivitis: Secondary | ICD-10-CM | POA: Diagnosis not present

## 2018-01-24 DIAGNOSIS — H401132 Primary open-angle glaucoma, bilateral, moderate stage: Secondary | ICD-10-CM | POA: Diagnosis not present

## 2018-01-27 NOTE — Anesthesia Preprocedure Evaluation (Signed)
Anesthesia Evaluation  Patient identified by MRN, date of birth, ID band Patient awake    Reviewed: Allergy & Precautions, H&P , NPO status , Patient's Chart, lab work & pertinent test results  History of Anesthesia Complications (+) PONV and history of anesthetic complications  Airway Mallampati: II  TM Distance: >3 FB Neck ROM: Full    Dental no notable dental hx. (+) Teeth Intact, Dental Advisory Given   Pulmonary sleep apnea and Continuous Positive Airway Pressure Ventilation , former smoker,    Pulmonary exam normal breath sounds clear to auscultation       Cardiovascular hypertension,  Rhythm:Regular Rate:Normal  MYOCARDIAL PERFUSION IMAGING  2/07     No ischemia     Neuro/Psych PSYCHIATRIC DISORDERS Depression    GI/Hepatic hiatal hernia, PUD, GERD  Medicated and Controlled,  Endo/Other    Renal/GU      Musculoskeletal  (+) Arthritis ,   Abdominal   Peds  Hematology   Anesthesia Other Findings   Reproductive/Obstetrics                             Anesthesia Physical  Anesthesia Plan  ASA: III  Anesthesia Plan: General   Post-op Pain Management: GA combined w/ Regional for post-op pain   Induction: Intravenous  PONV Risk Score and Plan: 1 and Ondansetron and Treatment may vary due to age or medical condition  Airway Management Planned: Oral ETT and LMA  Additional Equipment:   Intra-op Plan:   Post-operative Plan: Extubation in OR  Informed Consent: I have reviewed the patients History and Physical, chart, labs and discussed the procedure including the risks, benefits and alternatives for the proposed anesthesia with the patient or authorized representative who has indicated his/her understanding and acceptance.   Dental advisory given  Plan Discussed with: CRNA, Anesthesiologist and Surgeon  Anesthesia Plan Comments:         Anesthesia Quick Evaluation

## 2018-01-28 ENCOUNTER — Inpatient Hospital Stay (HOSPITAL_COMMUNITY)
Admission: RE | Admit: 2018-01-28 | Discharge: 2018-01-29 | DRG: 483 | Disposition: A | Discharge status: Home / Self Care | Payer: Medicare Other | Source: Ambulatory Visit | Attending: Orthopedic Surgery | Admitting: Orthopedic Surgery

## 2018-01-28 ENCOUNTER — Inpatient Hospital Stay (HOSPITAL_COMMUNITY): Payer: Medicare Other | Admitting: Certified Registered Nurse Anesthetist

## 2018-01-28 ENCOUNTER — Encounter (HOSPITAL_COMMUNITY): Payer: Self-pay | Admitting: *Deleted

## 2018-01-28 ENCOUNTER — Encounter (HOSPITAL_COMMUNITY)
Admission: RE | Disposition: A | Discharge status: Home / Self Care | Payer: Self-pay | Source: Ambulatory Visit | Attending: Orthopedic Surgery

## 2018-01-28 ENCOUNTER — Other Ambulatory Visit: Payer: Self-pay

## 2018-01-28 ENCOUNTER — Inpatient Hospital Stay (HOSPITAL_COMMUNITY): Payer: Medicare Other

## 2018-01-28 DIAGNOSIS — Z87442 Personal history of urinary calculi: Secondary | ICD-10-CM | POA: Diagnosis not present

## 2018-01-28 DIAGNOSIS — M19011 Primary osteoarthritis, right shoulder: Principal | ICD-10-CM | POA: Diagnosis present

## 2018-01-28 DIAGNOSIS — G8929 Other chronic pain: Secondary | ICD-10-CM | POA: Diagnosis not present

## 2018-01-28 DIAGNOSIS — K219 Gastro-esophageal reflux disease without esophagitis: Secondary | ICD-10-CM | POA: Diagnosis present

## 2018-01-28 DIAGNOSIS — Z981 Arthrodesis status: Secondary | ICD-10-CM

## 2018-01-28 DIAGNOSIS — Z86718 Personal history of other venous thrombosis and embolism: Secondary | ICD-10-CM

## 2018-01-28 DIAGNOSIS — Z7982 Long term (current) use of aspirin: Secondary | ICD-10-CM

## 2018-01-28 DIAGNOSIS — N39498 Other specified urinary incontinence: Secondary | ICD-10-CM | POA: Diagnosis present

## 2018-01-28 DIAGNOSIS — R350 Frequency of micturition: Secondary | ICD-10-CM | POA: Diagnosis present

## 2018-01-28 DIAGNOSIS — G4730 Sleep apnea, unspecified: Secondary | ICD-10-CM | POA: Diagnosis present

## 2018-01-28 DIAGNOSIS — Z9049 Acquired absence of other specified parts of digestive tract: Secondary | ICD-10-CM

## 2018-01-28 DIAGNOSIS — H409 Unspecified glaucoma: Secondary | ICD-10-CM | POA: Diagnosis present

## 2018-01-28 DIAGNOSIS — N401 Enlarged prostate with lower urinary tract symptoms: Secondary | ICD-10-CM | POA: Diagnosis present

## 2018-01-28 DIAGNOSIS — Z86010 Personal history of colonic polyps: Secondary | ICD-10-CM

## 2018-01-28 DIAGNOSIS — M545 Low back pain: Secondary | ICD-10-CM | POA: Diagnosis present

## 2018-01-28 DIAGNOSIS — K5790 Diverticulosis of intestine, part unspecified, without perforation or abscess without bleeding: Secondary | ICD-10-CM | POA: Diagnosis not present

## 2018-01-28 DIAGNOSIS — K449 Diaphragmatic hernia without obstruction or gangrene: Secondary | ICD-10-CM | POA: Diagnosis present

## 2018-01-28 DIAGNOSIS — Z471 Aftercare following joint replacement surgery: Secondary | ICD-10-CM | POA: Diagnosis not present

## 2018-01-28 DIAGNOSIS — Z91048 Other nonmedicinal substance allergy status: Secondary | ICD-10-CM | POA: Diagnosis not present

## 2018-01-28 DIAGNOSIS — Z7951 Long term (current) use of inhaled steroids: Secondary | ICD-10-CM | POA: Diagnosis not present

## 2018-01-28 DIAGNOSIS — H9190 Unspecified hearing loss, unspecified ear: Secondary | ICD-10-CM | POA: Diagnosis present

## 2018-01-28 DIAGNOSIS — Z974 Presence of external hearing-aid: Secondary | ICD-10-CM | POA: Diagnosis not present

## 2018-01-28 DIAGNOSIS — E785 Hyperlipidemia, unspecified: Secondary | ICD-10-CM | POA: Diagnosis present

## 2018-01-28 DIAGNOSIS — Z96653 Presence of artificial knee joint, bilateral: Secondary | ICD-10-CM | POA: Diagnosis present

## 2018-01-28 DIAGNOSIS — Z96611 Presence of right artificial shoulder joint: Secondary | ICD-10-CM | POA: Diagnosis not present

## 2018-01-28 DIAGNOSIS — Z87891 Personal history of nicotine dependence: Secondary | ICD-10-CM | POA: Diagnosis not present

## 2018-01-28 DIAGNOSIS — M25511 Pain in right shoulder: Secondary | ICD-10-CM | POA: Diagnosis not present

## 2018-01-28 DIAGNOSIS — G8918 Other acute postprocedural pain: Secondary | ICD-10-CM | POA: Diagnosis not present

## 2018-01-28 HISTORY — PX: TOTAL SHOULDER ARTHROPLASTY: SHX126

## 2018-01-28 SURGERY — ARTHROPLASTY, SHOULDER, TOTAL
Anesthesia: General | Site: Shoulder | Laterality: Right

## 2018-01-28 MED ORDER — METHOCARBAMOL 500 MG PO TABS: 500.0000 mg | ORAL_TABLET | Freq: Four times a day (QID) | ORAL | Status: DC | PRN

## 2018-01-28 MED ORDER — SODIUM CHLORIDE 0.9 % IV SOLN
INTRAVENOUS | Status: DC
  Administered 2018-01-28: 14:00:00 via INTRAVENOUS

## 2018-01-28 MED ORDER — ONDANSETRON HCL 4 MG/2ML IJ SOLN: 4.0000 mg | Freq: Once | INTRAMUSCULAR | Status: DC | PRN

## 2018-01-28 MED ORDER — METOCLOPRAMIDE HCL 5 MG/ML IJ SOLN: 5.0000 mg | Freq: Three times a day (TID) | INTRAMUSCULAR | Status: DC | PRN

## 2018-01-28 MED ORDER — PROPOFOL 10 MG/ML IV BOLUS
INTRAVENOUS | Status: AC
  Filled 2018-01-28: qty 40

## 2018-01-28 MED ORDER — BUPIVACAINE-EPINEPHRINE 0.25% -1:200000 IJ SOLN
INTRAMUSCULAR | Status: DC | PRN
  Administered 2018-01-28: 10 mL

## 2018-01-28 MED ORDER — ADULT MULTIVITAMIN W/MINERALS CH: 1.0000 | ORAL_TABLET | Freq: Every day | ORAL | Status: DC

## 2018-01-28 MED ORDER — METOCLOPRAMIDE HCL 5 MG PO TABS: 5.0000 mg | ORAL_TABLET | Freq: Three times a day (TID) | ORAL | Status: DC | PRN

## 2018-01-28 MED ORDER — ASPIRIN 81 MG PO CHEW
81.0000 mg | CHEWABLE_TABLET | Freq: Every day | ORAL | Status: DC
  Administered 2018-01-28: 81 mg via ORAL
  Filled 2018-01-28: qty 1

## 2018-01-28 MED ORDER — EPHEDRINE SULFATE 50 MG/ML IJ SOLN
INTRAMUSCULAR | Status: DC | PRN
  Administered 2018-01-28 (×3): 10 mg via INTRAVENOUS

## 2018-01-28 MED ORDER — HYDROCODONE-ACETAMINOPHEN 5-325 MG PO TABS
1.0000 | ORAL_TABLET | Freq: Four times a day (QID) | ORAL | Status: DC
  Administered 2018-01-28 – 2018-01-29 (×3): 1 via ORAL
  Filled 2018-01-28 (×3): qty 1

## 2018-01-28 MED ORDER — OXYCODONE HCL 5 MG PO TABS: 5.0000 mg | ORAL_TABLET | Freq: Once | ORAL | Status: DC | PRN

## 2018-01-28 MED ORDER — KETAMINE HCL 10 MG/ML IJ SOLN
INTRAMUSCULAR | Status: AC
  Filled 2018-01-28: qty 1

## 2018-01-28 MED ORDER — LORATADINE 10 MG PO TABS
10.0000 mg | ORAL_TABLET | Freq: Every day | ORAL | Status: DC
  Filled 2018-01-28: qty 1

## 2018-01-28 MED ORDER — OMEGA-3-ACID ETHYL ESTERS 1 G PO CAPS
1.0000 g | ORAL_CAPSULE | Freq: Every day | ORAL | Status: DC
  Administered 2018-01-28: 1 g via ORAL
  Filled 2018-01-28 (×2): qty 1

## 2018-01-28 MED ORDER — LIDOCAINE HCL (CARDIAC) PF 100 MG/5ML IV SOSY
PREFILLED_SYRINGE | INTRAVENOUS | Status: DC | PRN
  Administered 2018-01-28: 100 mg via INTRAVENOUS

## 2018-01-28 MED ORDER — ATORVASTATIN CALCIUM 20 MG PO TABS
40.0000 mg | ORAL_TABLET | Freq: Every day | ORAL | Status: DC
  Administered 2018-01-28: 40 mg via ORAL
  Filled 2018-01-28: qty 2

## 2018-01-28 MED ORDER — MIDAZOLAM HCL 2 MG/2ML IJ SOLN
INTRAMUSCULAR | Status: AC
  Filled 2018-01-28: qty 2

## 2018-01-28 MED ORDER — MENTHOL 3 MG MT LOZG: 1.0000 | LOZENGE | OROMUCOSAL | Status: DC | PRN

## 2018-01-28 MED ORDER — DORZOLAMIDE HCL-TIMOLOL MAL 2-0.5 % OP SOLN
1.0000 [drp] | Freq: Two times a day (BID) | OPHTHALMIC | Status: DC
  Administered 2018-01-28: 1 [drp] via OPHTHALMIC
  Filled 2018-01-28: qty 10

## 2018-01-28 MED ORDER — PHENYLEPHRINE HCL 10 MG/ML IJ SOLN
INTRAMUSCULAR | Status: DC | PRN
  Administered 2018-01-28: 50 ug/min via INTRAVENOUS

## 2018-01-28 MED ORDER — ONDANSETRON HCL 4 MG PO TABS: 4.0000 mg | ORAL_TABLET | Freq: Four times a day (QID) | ORAL | Status: DC | PRN

## 2018-01-28 MED ORDER — POLYETHYLENE GLYCOL 3350 17 G PO PACK: 17.0000 g | PACK | Freq: Every day | ORAL | Status: DC | PRN

## 2018-01-28 MED ORDER — LIDOCAINE 2% (20 MG/ML) 5 ML SYRINGE
INTRAMUSCULAR | Status: AC
  Filled 2018-01-28: qty 5

## 2018-01-28 MED ORDER — CEFAZOLIN SODIUM-DEXTROSE 2-4 GM/100ML-% IV SOLN
2.0000 g | Freq: Four times a day (QID) | INTRAVENOUS | Status: AC
  Administered 2018-01-28 (×2): 2 g via INTRAVENOUS
  Filled 2018-01-28 (×3): qty 100

## 2018-01-28 MED ORDER — ALBUTEROL SULFATE (2.5 MG/3ML) 0.083% IN NEBU: 3.0000 mL | INHALATION_SOLUTION | RESPIRATORY_TRACT | Status: DC | PRN

## 2018-01-28 MED ORDER — LATANOPROST 0.005 % OP SOLN
1.0000 [drp] | Freq: Every day | OPHTHALMIC | Status: DC
  Administered 2018-01-28: 1 [drp] via OPHTHALMIC
  Filled 2018-01-28 (×2): qty 2.5

## 2018-01-28 MED ORDER — ONDANSETRON HCL 4 MG/2ML IJ SOLN
INTRAMUSCULAR | Status: AC
  Filled 2018-01-28: qty 2

## 2018-01-28 MED ORDER — OXYCODONE HCL 5 MG/5ML PO SOLN: 5.0000 mg | Freq: Once | ORAL | Status: DC | PRN

## 2018-01-28 MED ORDER — 0.9 % SODIUM CHLORIDE (POUR BTL) OPTIME
TOPICAL | Status: DC | PRN
  Administered 2018-01-28: 1000 mL

## 2018-01-28 MED ORDER — SUGAMMADEX SODIUM 200 MG/2ML IV SOLN
INTRAVENOUS | Status: DC | PRN
  Administered 2018-01-28: 150 mg via INTRAVENOUS
  Administered 2018-01-28: 50 mg via INTRAVENOUS

## 2018-01-28 MED ORDER — FENTANYL CITRATE (PF) 100 MCG/2ML IJ SOLN
INTRAMUSCULAR | Status: DC | PRN
  Administered 2018-01-28: 100 ug via INTRAVENOUS

## 2018-01-28 MED ORDER — ACETAMINOPHEN 160 MG/5ML PO SOLN: 325.0000 mg | ORAL | Status: DC | PRN

## 2018-01-28 MED ORDER — HYDROMORPHONE HCL 1 MG/ML IJ SOLN: 1.0000 mg | INTRAMUSCULAR | Status: DC | PRN

## 2018-01-28 MED ORDER — MAGNESIUM OXIDE 400 (241.3 MG) MG PO TABS
400.0000 mg | ORAL_TABLET | Freq: Every day | ORAL | Status: DC
  Administered 2018-01-28: 400 mg via ORAL
  Filled 2018-01-28: qty 1

## 2018-01-28 MED ORDER — DEXAMETHASONE SODIUM PHOSPHATE 10 MG/ML IJ SOLN
INTRAMUSCULAR | Status: DC | PRN
  Administered 2018-01-28: 10 mg via INTRAVENOUS

## 2018-01-28 MED ORDER — DEXAMETHASONE SODIUM PHOSPHATE 10 MG/ML IJ SOLN
INTRAMUSCULAR | Status: AC
  Filled 2018-01-28: qty 1

## 2018-01-28 MED ORDER — FENTANYL CITRATE (PF) 100 MCG/2ML IJ SOLN: 25.0000 ug | INTRAMUSCULAR | Status: DC | PRN

## 2018-01-28 MED ORDER — DOCUSATE SODIUM 100 MG PO CAPS
100.0000 mg | ORAL_CAPSULE | Freq: Two times a day (BID) | ORAL | Status: DC
  Administered 2018-01-28: 100 mg via ORAL
  Filled 2018-01-28 (×2): qty 1

## 2018-01-28 MED ORDER — LACTATED RINGERS IV SOLN
INTRAVENOUS | Status: DC | PRN
  Administered 2018-01-28 (×2): via INTRAVENOUS

## 2018-01-28 MED ORDER — ONDANSETRON HCL 4 MG/2ML IJ SOLN
INTRAMUSCULAR | Status: DC | PRN
  Administered 2018-01-28: 4 mg via INTRAVENOUS

## 2018-01-28 MED ORDER — ROCURONIUM BROMIDE 50 MG/5ML IV SOLN
INTRAVENOUS | Status: AC
  Filled 2018-01-28: qty 1

## 2018-01-28 MED ORDER — ACETAMINOPHEN 325 MG PO TABS: 325.0000 mg | ORAL_TABLET | ORAL | Status: DC | PRN

## 2018-01-28 MED ORDER — SUGAMMADEX SODIUM 200 MG/2ML IV SOLN
INTRAVENOUS | Status: AC
  Filled 2018-01-28: qty 2

## 2018-01-28 MED ORDER — ONDANSETRON HCL 4 MG/2ML IJ SOLN: 4.0000 mg | Freq: Four times a day (QID) | INTRAMUSCULAR | Status: DC | PRN

## 2018-01-28 MED ORDER — SODIUM CHLORIDE 0.9 % IJ SOLN
INTRAMUSCULAR | Status: AC
  Filled 2018-01-28: qty 10

## 2018-01-28 MED ORDER — PHENOL 1.4 % MT LIQD: 1.0000 | OROMUCOSAL | Status: DC | PRN

## 2018-01-28 MED ORDER — EPHEDRINE SULFATE 50 MG/ML IJ SOLN
INTRAMUSCULAR | Status: AC
  Filled 2018-01-28: qty 1

## 2018-01-28 MED ORDER — THROMBIN (RECOMBINANT) 5000 UNITS EX SOLR
CUTANEOUS | Status: AC
  Filled 2018-01-28: qty 5000

## 2018-01-28 MED ORDER — OXYCODONE HCL 5 MG PO TABS: 5.0000 mg | ORAL_TABLET | ORAL | Status: DC | PRN

## 2018-01-28 MED ORDER — GELATIN ABSORBABLE 12-7 MM EX MISC
CUTANEOUS | Status: DC | PRN
  Administered 2018-01-28: 1 via TOPICAL

## 2018-01-28 MED ORDER — FLUTICASONE PROPIONATE 50 MCG/ACT NA SUSP
1.0000 | Freq: Every day | NASAL | Status: DC
  Filled 2018-01-28: qty 16

## 2018-01-28 MED ORDER — CALCIUM CITRATE-VITAMIN D 315-200 MG-UNIT PO TABS: 1.0000 | ORAL_TABLET | Freq: Three times a day (TID) | ORAL | Status: DC

## 2018-01-28 MED ORDER — SUCCINYLCHOLINE CHLORIDE 20 MG/ML IJ SOLN
INTRAMUSCULAR | Status: DC | PRN
  Administered 2018-01-28: 120 mg via INTRAVENOUS
  Administered 2018-01-28: 50 mg via INTRAVENOUS

## 2018-01-28 MED ORDER — TRAZODONE HCL 100 MG PO TABS
100.0000 mg | ORAL_TABLET | Freq: Every day | ORAL | Status: DC
  Administered 2018-01-28: 100 mg via ORAL
  Filled 2018-01-28: qty 1

## 2018-01-28 MED ORDER — MEPERIDINE HCL 50 MG/ML IJ SOLN: 6.2500 mg | INTRAMUSCULAR | Status: DC | PRN

## 2018-01-28 MED ORDER — BUPIVACAINE LIPOSOME 1.3 % IJ SUSP
INTRAMUSCULAR | Status: DC | PRN
  Administered 2018-01-28: 10 mL via PERINEURAL

## 2018-01-28 MED ORDER — BUPIVACAINE HCL (PF) 0.5 % IJ SOLN
INTRAMUSCULAR | Status: DC | PRN
  Administered 2018-01-28: 20 mL via PERINEURAL

## 2018-01-28 MED ORDER — CALCIUM CARBONATE 1250 (500 CA) MG PO TABS
500.0000 mg | ORAL_TABLET | Freq: Three times a day (TID) | ORAL | Status: DC
  Administered 2018-01-28: 500 mg via ORAL
  Filled 2018-01-28 (×3): qty 1

## 2018-01-28 MED ORDER — OXYCODONE-ACETAMINOPHEN 5-325 MG PO TABS: 1.0000 | ORAL_TABLET | ORAL | 0 refills | Status: DC | PRN

## 2018-01-28 MED ORDER — MIDAZOLAM HCL 5 MG/5ML IJ SOLN
INTRAMUSCULAR | Status: DC | PRN
  Administered 2018-01-28: 2 mg via INTRAVENOUS

## 2018-01-28 MED ORDER — LISINOPRIL 20 MG PO TABS
20.0000 mg | ORAL_TABLET | Freq: Every day | ORAL | Status: DC
  Administered 2018-01-28: 20 mg via ORAL
  Filled 2018-01-28 (×2): qty 1

## 2018-01-28 MED ORDER — KETAMINE HCL 10 MG/ML IJ SOLN
INTRAMUSCULAR | Status: DC | PRN
  Administered 2018-01-28: 20 mg via INTRAVENOUS

## 2018-01-28 MED ORDER — BUPIVACAINE-EPINEPHRINE (PF) 0.25% -1:200000 IJ SOLN
INTRAMUSCULAR | Status: AC
  Filled 2018-01-28: qty 30

## 2018-01-28 MED ORDER — LIDOCAINE 2% (20 MG/ML) 5 ML SYRINGE
INTRAMUSCULAR | Status: AC
  Filled 2018-01-28: qty 10

## 2018-01-28 MED ORDER — CEFAZOLIN SODIUM-DEXTROSE 2-4 GM/100ML-% IV SOLN
2.0000 g | INTRAVENOUS | Status: AC
  Administered 2018-01-28: 2 g via INTRAVENOUS
  Filled 2018-01-28: qty 100

## 2018-01-28 MED ORDER — CHLORHEXIDINE GLUCONATE 4 % EX LIQD: 60.0000 mL | Freq: Once | CUTANEOUS | Status: DC

## 2018-01-28 MED ORDER — ACETAMINOPHEN 325 MG PO TABS
325.0000 mg | ORAL_TABLET | Freq: Four times a day (QID) | ORAL | Status: DC | PRN
Start: 2018-01-29 — End: 2018-01-29

## 2018-01-28 MED ORDER — FENTANYL 25 MCG/HR TD PT72: 25.0000 ug | MEDICATED_PATCH | TRANSDERMAL | Status: DC

## 2018-01-28 MED ORDER — THROMBIN 5000 UNITS EX SOLR
CUTANEOUS | Status: DC | PRN
  Administered 2018-01-28: 5000 [IU] via TOPICAL

## 2018-01-28 MED ORDER — METHOCARBAMOL 1000 MG/10ML IJ SOLN
500.0000 mg | Freq: Four times a day (QID) | INTRAVENOUS | Status: DC | PRN
  Filled 2018-01-28 (×2): qty 5

## 2018-01-28 MED ORDER — VORTIOXETINE HBR 10 MG PO TABS
10.0000 mg | ORAL_TABLET | Freq: Every day | ORAL | Status: DC
  Administered 2018-01-28: 10 mg via ORAL
  Filled 2018-01-28 (×2): qty 1

## 2018-01-28 MED ORDER — ROCURONIUM BROMIDE 100 MG/10ML IV SOLN
INTRAVENOUS | Status: DC | PRN
  Administered 2018-01-28: 50 mg via INTRAVENOUS

## 2018-01-28 MED ORDER — FENTANYL CITRATE (PF) 250 MCG/5ML IJ SOLN
INTRAMUSCULAR | Status: AC
  Filled 2018-01-28: qty 5

## 2018-01-28 MED ORDER — ALBUTEROL SULFATE HFA 108 (90 BASE) MCG/ACT IN AERS
INHALATION_SPRAY | RESPIRATORY_TRACT | Status: DC | PRN
  Administered 2018-01-28: 4 via RESPIRATORY_TRACT

## 2018-01-28 MED ORDER — METHOCARBAMOL 500 MG PO TABS: 500.0000 mg | ORAL_TABLET | Freq: Three times a day (TID) | ORAL | 1 refills | Status: DC | PRN

## 2018-01-28 MED ORDER — PROPOFOL 10 MG/ML IV BOLUS
INTRAVENOUS | Status: DC | PRN
  Administered 2018-01-28: 200 mg via INTRAVENOUS
  Administered 2018-01-28 (×2): 50 mg via INTRAVENOUS

## 2018-01-28 SURGICAL SUPPLY — 67 items
BIT DRILL 5/64X5 DISP (BIT) ×3 IMPLANT
BLADE SAW SAG 73X25 THK (BLADE) ×2
BLADE SAW SGTL 73X25 THK (BLADE) ×1 IMPLANT
CAPT SHLDR TOTAL 2 ×2 IMPLANT
CEMENT HV SMART SET (Cement) ×2 IMPLANT
CLOSURE STERI-STRIP 1/2X4 (GAUZE/BANDAGES/DRESSINGS) ×1
CLOSURE WOUND 1/2 X4 (GAUZE/BANDAGES/DRESSINGS) ×1
CLSR STERI-STRIP ANTIMIC 1/2X4 (GAUZE/BANDAGES/DRESSINGS) ×1 IMPLANT
COVER SURGICAL LIGHT HANDLE (MISCELLANEOUS) ×3 IMPLANT
DRAPE INCISE IOBAN 66X45 STRL (DRAPES) ×3 IMPLANT
DRAPE ORTHO SPLIT 77X108 STRL (DRAPES) ×6
DRAPE SURG ORHT 6 SPLT 77X108 (DRAPES) ×2 IMPLANT
DRAPE U-SHAPE 47X51 STRL (DRAPES) ×3 IMPLANT
DRSG ADAPTIC 3X8 NADH LF (GAUZE/BANDAGES/DRESSINGS) ×3 IMPLANT
DRSG PAD ABDOMINAL 8X10 ST (GAUZE/BANDAGES/DRESSINGS) ×6 IMPLANT
DURAPREP 26ML APPLICATOR (WOUND CARE) ×3 IMPLANT
ELECT BLADE 4.0 EZ CLEAN MEGAD (MISCELLANEOUS) ×3
ELECT NDL TIP 2.8 STRL (NEEDLE) ×1 IMPLANT
ELECT NEEDLE TIP 2.8 STRL (NEEDLE) ×3 IMPLANT
ELECT REM PT RETURN 9FT ADLT (ELECTROSURGICAL) ×3
ELECTRODE BLDE 4.0 EZ CLN MEGD (MISCELLANEOUS) ×1 IMPLANT
ELECTRODE REM PT RTRN 9FT ADLT (ELECTROSURGICAL) ×1 IMPLANT
GAUZE SPONGE 4X4 12PLY STRL (GAUZE/BANDAGES/DRESSINGS) ×3 IMPLANT
GLOVE BIOGEL PI ORTHO PRO 7.5 (GLOVE) ×2
GLOVE BIOGEL PI ORTHO PRO SZ8 (GLOVE) ×2
GLOVE ORTHO TXT STRL SZ7.5 (GLOVE) ×3 IMPLANT
GLOVE PI ORTHO PRO STRL 7.5 (GLOVE) ×1 IMPLANT
GLOVE PI ORTHO PRO STRL SZ8 (GLOVE) ×1 IMPLANT
GLOVE SURG ORTHO 8.5 STRL (GLOVE) ×3 IMPLANT
GOWN STRL REUS W/ TWL XL LVL3 (GOWN DISPOSABLE) ×2 IMPLANT
GOWN STRL REUS W/TWL XL LVL3 (GOWN DISPOSABLE) ×6
KIT BASIN OR (CUSTOM PROCEDURE TRAY) ×3 IMPLANT
KIT TURNOVER KIT B (KITS) ×3 IMPLANT
MANIFOLD NEPTUNE II (INSTRUMENTS) ×3 IMPLANT
NDL 1/2 CIR MAYO (NEEDLE) ×1 IMPLANT
NDL HYPO 25GX1X1/2 BEV (NEEDLE) ×1 IMPLANT
NDL SUT 6 .5 CRC .975X.05 MAYO (NEEDLE) ×1 IMPLANT
NEEDLE 1/2 CIR MAYO (NEEDLE) ×3 IMPLANT
NEEDLE HYPO 25GX1X1/2 BEV (NEEDLE) ×3 IMPLANT
NEEDLE MAYO TAPER (NEEDLE) ×3
NS IRRIG 1000ML POUR BTL (IV SOLUTION) ×3 IMPLANT
PACK SHOULDER (CUSTOM PROCEDURE TRAY) ×3 IMPLANT
PAD ABD 8X10 STRL (GAUZE/BANDAGES/DRESSINGS) ×2 IMPLANT
PAD ARMBOARD 7.5X6 YLW CONV (MISCELLANEOUS) ×6 IMPLANT
PIN METAGLENE 2.5 (PIN) IMPLANT
SLING ARM FOAM STRAP LRG (SOFTGOODS) IMPLANT
SMARTMIX MINI TOWER (MISCELLANEOUS) ×3
SPONGE LAP 18X18 X RAY DECT (DISPOSABLE) ×3 IMPLANT
SPONGE LAP 4X18 RFD (DISPOSABLE) ×3 IMPLANT
SPONGE SURGIFOAM ABS GEL SZ50 (HEMOSTASIS) IMPLANT
STRIP CLOSURE SKIN 1/2X4 (GAUZE/BANDAGES/DRESSINGS) ×2 IMPLANT
SUCTION FRAZIER HANDLE 10FR (MISCELLANEOUS) ×2
SUCTION TUBE FRAZIER 10FR DISP (MISCELLANEOUS) ×1 IMPLANT
SUT FIBERWIRE #2 38 T-5 BLUE (SUTURE) ×6
SUT MNCRL AB 4-0 PS2 18 (SUTURE) ×3 IMPLANT
SUT VIC AB 0 CT1 27 (SUTURE) ×3
SUT VIC AB 0 CT1 27XBRD ANBCTR (SUTURE) ×1 IMPLANT
SUT VIC AB 0 CT2 27 (SUTURE) ×3 IMPLANT
SUT VIC AB 2-0 CT1 27 (SUTURE) ×3
SUT VIC AB 2-0 CT1 TAPERPNT 27 (SUTURE) ×1 IMPLANT
SUT VICRYL AB 2 0 TIES (SUTURE) IMPLANT
SUTURE FIBERWR #2 38 T-5 BLUE (SUTURE) ×2 IMPLANT
SYR CONTROL 10ML LL (SYRINGE) ×3 IMPLANT
TOWEL OR 17X24 6PK STRL BLUE (TOWEL DISPOSABLE) ×3 IMPLANT
TOWEL OR 17X26 10 PK STRL BLUE (TOWEL DISPOSABLE) ×3 IMPLANT
TOWER SMARTMIX MINI (MISCELLANEOUS) ×1 IMPLANT
YANKAUER SUCT BULB TIP NO VENT (SUCTIONS) IMPLANT

## 2018-01-28 NOTE — Transfer of Care (Signed)
Immediate Anesthesia Transfer of Care Note  Patient: [name]  Procedure(s) Performed: RIGHT TOTAL SHOULDER ARTHROPLASTY (Right Shoulder)  Patient Location: PACU  Anesthesia Type:General  Level of Consciousness: awake, alert  and oriented  Airway & Oxygen Therapy: Patient Spontanous Breathing and Patient connected to face mask oxygen  Post-op Assessment: Report given to RN, Post -op Vital signs reviewed and stable and Patient moving all extremities X 4  Post vital signs: Reviewed and stable  Last Vitals:  Vitals Value Taken Time  BP    Temp    Pulse    Resp    SpO2      Last Pain:  Vitals:   01/28/18 0604  PainSc: 6          Complications: No apparent anesthesia complications respiratory therapy bringing CPAP to bedside.

## 2018-01-28 NOTE — Op Note (Signed)
NAME: [name], [place] MEDICAL RECORD XN:[id number] ACCOUNT [id number] DATE OF BIRTH:1950/08/23 FACILITY: [facility] LOCATION: [facility] PHYSICIAN:STEVEN Orlena Sheldon, MD  OPERATIVE REPORT  DATE OF PROCEDURE:  01/28/2018  PREOPERATIVE DIAGNOSIS:  Right shoulder end-stage arthritis.  POSTOPERATIVE DIAGNOSIS:  Right shoulder end-stage arthritis.  PROCEDURE PERFORMED:  Right anatomic total shoulder arthroplasty using DePuy [employer] Unite system.  ATTENDING SURGEON:   Doran Heater.  Veverly Fells, MD  ASSISTANT:  Charletta Cousin [place], [place], who was scrubbed the entire procedure and necessary for satisfactory completion of surgery.  ANESTHESIA:  General anesthesia was used plus interscalene block.  ESTIMATED BLOOD LOSS:  200 mL.  FLUID REPLACEMENT:  1500 mL crystalloid.  COUNTS:  Correct.  COMPLICATIONS:  None.  ANTIBIOTICS:  Perioperative antibiotics were given.  INDICATIONS:  The patient is a 67 year old male with a history of worsening right shoulder pain secondary to end-stage arthritis.  The patient has bone-on-bone changes and some posterior subluxation of the humeral head relative to his glenoid.  The  patient has had progressive pain despite conservative management interfering with ADLs and sleep.  The patient now presents desiring total shoulder replacement to remove pain and restore function to his shoulder.  Informed consent obtained.  DESCRIPTION OF PROCEDURE:  After an adequate level of anesthesia was achieved, the patient was positioned in the modified beach chair position.  Right shoulder was correctly identified and sterilely prepped and draped in the usual manner.  Timeout was  called.  We entered the shoulder using standard deltopectoral incision starting at the coracoid process, extending down to the anterior humerus.  Dissection down through subcutaneous tissues using Bovie electrocautery.  Identified cephalic vein.  We took  it laterally with the deltoid, pectoralis taken medially.   Conjoined tendon was identified and retracted medially.  Deep retractors placed.  We tenodesed the biceps in situ with 0 Vicryl figure-of-eight suture x2.  We then released the subscapularis  subperiosteally off the lesser tuberosity, tagging with #2 FiberWire in a modified Mason-Allen suture technique for repair at the end.  We released the inferior capsule, progressively externally rotating.  We then placed a T-handled Crego elevator over  the humeral head at the margin of the greater tuberosity, right where the rotator cuff inserts to protect the rotator cuff.  We then placed the elbow at the patient's side and externally rotated to 30 degrees for a cut, which was made anterior, posterior  with the oscillating saw.  We used a large Crego and reverse Hohmann inferiorly to protect the axillary nerve.  Once our cut was made which was flush with the rotator cuff insertion, we then went ahead and subluxed the humerus posteriorly.  We did a 360  degree capsule labral excision including removing the biceps stump.  We had good exposure of the glenoid which was devoid of any cartilage as was the humeral head.  We then identified our central point for our reamer and placed our guide pin.  We sized  the glenoid to a size 48 and reamed for the 48.  We used a peripheral hand reamer and then drilled our central peg hole for the APG glenoid.  We then used our peripheral peg hole guide and placed that in through the central peg hole and then drilled our  superior and inferior peg holes.  Next, we used Gelfoam soaked in thrombin for our 3 peripheral holes and dried that well.  We trialed first with a 48 APG trial and it fit perfectly with good bony support and coverage.  We then removed the trial, placed  our Gelfoam soaked in thrombin in the peripheral 3 holes.  We vacuum mixed DePuy high viscosity cement on the back table and placed that in the 3 peripheral holes and then impacted the real 48 APG glenoid into position  and held pressure until the cement  hardened.  We checked the security and the support for glenoid and it was excellent.  We then went back to the humeral side, extending the shoulder and externally rotating and then did our humeral preparation reaming up to a size 12 and then did a 10 and  12 broach placed in the humeral implant in 30 degrees of retroversion.  We trialed with the 48/21 head which gave good coverage and excellent balance and stability.  I was able to translate inferiorly 50% of the glenoid diameter and 50% posteriorly with  a head and it spontaneously relocated.  We then irrigated thoroughly, removed the trial components, placed drill holes and #2 FiberWire suture in the lesser tuberosity for repair of the subscapularis.  We then impacted the real press fit size 12 stem  with a 12 stem and the 12 metaphysis and impacted that.  We then selected the real 48/21 head, dialing the eccentricity superiorly had excellent bony coverage.  We reduced the shoulder and then repaired the subscapularis anatomically back to bone after  thorough irrigation.  We also repaired the rotator interval,  careful not to over tighten that, but we had an anatomic subscap repair to bone.  At this point, we ranged the shoulder, had excellent range nice stability.  We irrigated thoroughly, closed  deltopectoral interval with 0 Vicryl suture followed by 2-0 Vicryl for subcutaneous closure and 4-0 Monocryl for skin.  Steri-Strips applied followed by sterile dressing.    The patient tolerated surgery well.  AN/NUANCE  D:01/28/2018 T:01/28/2018 JOB:[id number]

## 2018-01-28 NOTE — Anesthesia Procedure Notes (Addendum)
Procedure Name: Intubation Date/Time: 01/28/2018 7:39 AM Performed by: Janeece Riggers, MD Pre-anesthesia Checklist: Patient identified, Emergency Drugs available, Suction available and Patient being monitored Patient Re-evaluated:Patient Re-evaluated prior to induction Oxygen Delivery Method: Circle System Utilized Preoxygenation: Pre-oxygenation with 100% oxygen Induction Type: IV induction Ventilation: Mask ventilation without difficulty and Oral airway inserted - appropriate to patient size Laryngoscope Size: Mac and 4 Grade View: Grade III Tube type: Oral Tube size: 7.5 mm Number of attempts: 1 Airway Equipment and Method: Stylet and Oral airway Placement Confirmation: ETT inserted through vocal cords under direct vision,  positive ETCO2 and breath sounds checked- equal and bilateral Secured at: 23 cm Tube secured with: Tape Dental Injury: Teeth and Oropharynx as per pre-operative assessment  Comments: Placed by Katharina Caper under supervision of CRNA and MD

## 2018-01-28 NOTE — Discharge Instructions (Signed)
Ice to the shoulder at all times.  Keep the incision clean and dry and covered for one week, then ok to get it wet in the shower.  Do exercises every hour while awake.  Please use the sling while up and around. If seated in the home, may remove the sling and "hug a pillow"  No pushing, pulling, or lifting with the right shoulder and arm.  Follow up with Dr Veverly Fells in two weeks.  Call [phone] for appt

## 2018-01-28 NOTE — Anesthesia Procedure Notes (Signed)
Anesthesia Regional Block: Interscalene brachial plexus block   Pre-Anesthetic Checklist: ,, timeout performed, Correct Patient, Correct Site, Correct Laterality, Correct Procedure, Correct Position, site marked, Risks and benefits discussed,  Surgical consent,  Pre-op evaluation,  At surgeon's request and post-op pain management  Laterality: Right  Prep: chloraprep       Needles:  Injection technique: Single-shot  Needle Type: Echogenic Stimulator Needle     Needle Length: 5cm  Needle Gauge: 22     Additional Needles:   Procedures:, nerve stimulator,,, ultrasound used (permanent image in chart),,,,   Nerve Stimulator or Paresthesia:  Response: deltoid, 0.45 mA,   Additional Responses:   Narrative:  Start time: 01/28/2018 7:00 AM End time: 01/28/2018 7:05 AM Injection made incrementally with aspirations every 5 mL.  Performed by: Personally  Anesthesiologist: Janeece Riggers, MD  Additional Notes: Functioning IV was confirmed and monitors were applied.  A 73m 22ga Arrow echogenic stimulator needle was used. Sterile prep and drape,hand hygiene and sterile gloves were used. Ultrasound guidance: relevant anatomy identified, needle position confirmed, local anesthetic spread visualized around nerve(s)., vascular puncture avoided.  Image printed for medical record. Negative aspiration and negative test dose prior to incremental administration of local anesthetic. The patient tolerated the procedure well.

## 2018-01-28 NOTE — Interval H&P Note (Signed)
History and Physical Interval Note:  01/28/2018 7:23 AM  [name]  has presented today for surgery, with the diagnosis of Right shoulder end stage osteoarthritis  The various methods of treatment have been discussed with the patient and family. After consideration of risks, benefits and other options for treatment, the patient has consented to  Procedure(s): RIGHT TOTAL SHOULDER ARTHROPLASTY (Right) as a surgical intervention .  The patient's history has been reviewed, patient examined, no change in status, stable for surgery.  I have reviewed the patient's chart and labs.  Questions were answered to the patient's satisfaction.     Adisa Vigeant,STEVEN R

## 2018-01-28 NOTE — Progress Notes (Signed)
Pt set up for cpap fo rthe night.

## 2018-01-28 NOTE — Brief Op Note (Signed)
01/28/2018  10:08 AM  PATIENT:  [name]  67 y.o. male  PRE-OPERATIVE DIAGNOSIS:  Right shoulder end stage osteoarthritis  POST-OPERATIVE DIAGNOSIS:  Right shoulder end stage osteoarthritis  PROCEDURE:  Procedure(s): RIGHT TOTAL SHOULDER ARTHROPLASTY (Right) DePuy [employer] Unite anatomic shoulder replacement  SURGEON:  Surgeon(s) and Role:    [name], MD - Primary  PHYSICIAN ASSISTANT:   ASSISTANTS: [place] Bruns, PA-C   ANESTHESIA:   regional and general  EBL:  50 mL   BLOOD ADMINISTERED:none  DRAINS: none   LOCAL MEDICATIONS USED:  MARCAINE     SPECIMEN:  No Specimen  DISPOSITION OF SPECIMEN:  N/A  COUNTS:  YES  TOURNIQUET:  * No tourniquets in log *  DICTATION: .Other Dictation: Dictation Number [id number]  PLAN OF CARE: Admit to inpatient   PATIENT DISPOSITION:  PACU - hemodynamically stable.   Delay start of Pharmacological VTE agent (>24hrs) due to surgical blood loss or risk of bleeding: not applicable

## 2018-01-28 NOTE — Anesthesia Postprocedure Evaluation (Signed)
Anesthesia Post Note  Patient: [name]  Procedure(s) Performed: RIGHT TOTAL SHOULDER ARTHROPLASTY (Right Shoulder)     Patient location during evaluation: PACU Anesthesia Type: General Level of consciousness: awake and alert Pain management: pain level controlled Vital Signs Assessment: post-procedure vital signs reviewed and stable Respiratory status: spontaneous breathing, nonlabored ventilation, respiratory function stable and patient connected to nasal cannula oxygen Cardiovascular status: blood pressure returned to baseline and stable Postop Assessment: no apparent nausea or vomiting Anesthetic complications: no    Last Vitals:  Vitals:   01/28/18 1235 01/28/18 1254  BP:  135/76  Pulse: 71 73  Resp: (!) 22 20  Temp: 36.6 C 36.4 C  SpO2: 95% 94%    Last Pain:  Vitals:   01/28/18 1300  TempSrc:   PainSc: 0-No pain                 Glendy Barsanti

## 2018-01-29 NOTE — Progress Notes (Signed)
Patient is discharged from room 3C05 at this time. Alert and in stable condition. IV site d/c'd and instructions read to patient with understanding verbalized. Left unit via wheelchair with all belongings at side.

## 2018-01-29 NOTE — Evaluation (Signed)
Occupational Therapy Evaluation Patient Details Name: [name] MRN: [id number] DOB: Sep 13, 1950 Today's Date: 01/29/2018    History of Present Illness This 67 y.o. male admitted for Rt TSA.  PMH includes:  s/p lumbar laminectomy, lumbago, glaucoma, DVT, chronic back pain arthritis, s/p TKA bil. s/p Rt wrist fusion    Clinical Impression   Patient evaluated by Occupational Therapy with no further acute OT needs identified. All education has been completed and the patient has no further questions. All education completed.  Pt and significant other demonstrate good understanding of precautions.  See below for any follow-up Occupational Therapy or equipment needs. OT is signing off. Thank you for this referral.      Follow Up Recommendations  Follow surgeon's recommendation for DC plan and follow-up therapies;Supervision/Assistance - 24 hour    Equipment Recommendations  None recommended by OT    Recommendations for Other Services       Precautions / Restrictions Precautions Precautions: Shoulder Type of Shoulder Precautions: Sling For comfort and sleep; Non weight bearingYes; AROM elbow, wrist and hand to tolerance; Please instruct on AAROM with FF to 90, ER to 30 deg or comfort and gentle lap slides and pendulums, ok for ADLs in front of him. No pushing out of chair thought Shoulder Interventions: At all times;Off for dressing/bathing/exercises Precaution Booklet Issued: Yes (comment) Precaution Comments: shoulder hand out and exercise handouts provided to pt and s/o.  Handouts reviewed  Required Braces or Orthoses: Sling Restrictions Weight Bearing Restrictions: No      Mobility Bed Mobility Overal bed mobility: Modified Independent             General bed mobility comments: Mod I with HOB elevated.  Pt reports he will sleep in recliner at home   Transfers Overall transfer level: Modified independent                    Balance                                            ADL either performed or assessed with clinical judgement   ADL Overall ADL's : Needs assistance/impaired Eating/Feeding: Set up;Sitting   Grooming: Wash/dry hands;Wash/dry face;Oral care;Brushing hair;Supervision/safety;Standing;Sitting   Upper Body Bathing: Moderate assistance;Sitting   Lower Body Bathing: Maximal assistance;Sit to/from stand   Upper Body Dressing : Maximal assistance;Sitting   Lower Body Dressing: Maximal assistance;Sit to/from stand   Toilet Transfer: [place] and Hygiene: Minimal assistance;Sit to/from stand       Functional mobility during ADLs: Modified independent       Vision         Perception     Praxis      Pertinent Vitals/Pain Pain Assessment: No/denies pain(nerve block still in effect )     Hand Dominance Right   Extremity/Trunk Assessment Upper Extremity Assessment Upper Extremity Assessment: RUE deficits/detail RUE Deficits / Details: limited due to TSA        Cervical / Trunk Assessment Cervical / Trunk Assessment: Other exceptions Cervical / Trunk Exceptions: h/o cervical fusion, per pt report, and pt reports "bad discs" in low back   Communication Communication Communication: No difficulties   Cognition Arousal/Alertness: Awake/alert Behavior During Therapy: WFL for tasks assessed/performed Overall Cognitive Status: Within Functional Limits for tasks assessed  General Comments  Pt instructed how to perform pendulum exercises with Lt UE due to nerve block still in effect on Rt. significant other very supportive and able to safely assist pt with ADLs     Exercises Exercises: Shoulder;Other exercises Shoulder Exercises Pendulum Exercise: (see comments above ) Shoulder Flexion: AAROM;Self ROM;Right;10 reps;Supine(to90*) Shoulder External Rotation: AAROM;Right;10 reps;Supine(to neutral  ) Elbow Flexion: AAROM;Right;10 reps;Seated Elbow Extension: AAROM;Right;10 reps;Seated Wrist Flexion: AROM;Right;10 reps;Seated Wrist Extension: AROM;Right;10 reps;Seated Digit Composite Flexion: AROM;Right;10 reps;Seated Composite Extension: AROM;Right;10 reps;Seated Neck Flexion: AROM;5 reps;Seated Neck Extension: AROM;5 reps;Seated Neck Lateral Flexion - Right: AROM;5 reps;Seated Neck Lateral Flexion - Left: AROM;5 reps;Seated Other Exercises Other Exercises: Pt instructed in lap slides and performed x 10 reps  Other Exercises: Pt instructed to perform grooming activities with Rt UE for activities that keep shoulder close and don't require external rotation or abduction    Shoulder Instructions Shoulder Instructions Donning/doffing shirt without moving shoulder: Caregiver independent with task Method for sponge bathing under operated UE: Caregiver independent with task Donning/doffing sling/immobilizer: Caregiver independent with task Correct positioning of sling/immobilizer: Caregiver independent with task Pendulum exercises (written home exercise program): Supervision/safety;Caregiver independent with task ROM for elbow, wrist and digits of operated UE: Minimal assistance;Patient able to independently direct caregiver;Caregiver independent with task Sling wearing schedule (on at all times/off for ADL's): Independent;Caregiver independent with task Proper positioning of operated UE when showering: Independent;Caregiver independent with task Positioning of UE while sleeping: Independent;Caregiver independent with task;Patient able to independently direct caregiver    Home Living Family/patient expects to be discharged to:: Private residence Living Arrangements: Spouse/significant other Available Help at Discharge: Family;Available 24 hours/day Type of Home: House       Home Layout: One level     Bathroom Shower/Tub: Occupational psychologist: Handicapped height                 Prior Functioning/Environment Level of Independence: Independent                 OT Problem List: Decreased strength;Decreased range of motion;Decreased knowledge of use of DME or AE;Decreased knowledge of precautions;Obesity;Impaired UE functional use      OT Treatment/Interventions:      OT Goals(Current goals can be found in the care plan section) Acute Rehab OT Goals Patient Stated Goal: To use Rt UE normally again  OT Goal Formulation: All assessment and education complete, DC therapy  OT Frequency:     Barriers to D/C:            Co-evaluation              AM-PAC PT "6 Clicks" Daily Activity     Outcome Measure Help from another person eating meals?: A Little Help from another person taking care of personal grooming?: A Little Help from another person toileting, which includes using toliet, bedpan, or urinal?: A Lot Help from another person bathing (including washing, rinsing, drying)?: A Lot Help from another person to put on and taking off regular upper body clothing?: A Lot Help from another person to put on and taking off regular lower body clothing?: A Lot 6 Click Score: 14   End of Session Equipment Utilized During Treatment: Other (comment)(sling ) Nurse Communication: Mobility status  Activity Tolerance: Patient tolerated treatment well Patient left: in bed;with call bell/phone within reach;with family/visitor present  OT Visit Diagnosis: Muscle weakness (generalized) (M62.81)                Time: 5621-3086  OT Time Calculation (min): 71 min Charges:  OT General Charges $OT Visit: 1 Visit OT Evaluation $OT Eval Moderate Complexity: 1 Mod OT Treatments $Self Care/Home Management : 53-67 mins G-Codes:     [facility], OTR/L [phone]   Lucille Passy M 01/29/2018, 11:36 AM

## 2018-01-29 NOTE — Progress Notes (Signed)
Subjective: 1 Day Post-Op Procedure(s) (LRB): RIGHT TOTAL SHOULDER ARTHROPLASTY (Right) Patient reports pain as tolerable. No other complaints,wants to go home and sitting with his wife.  Objective: Vital signs in last 24 hours: Temp:  [97.3 F (36.3 C)-98.6 F (37 C)] 98.6 F (37 C) (06/29 0907) Pulse Rate:  [60-92] 75 (06/29 0907) Resp:  [9-25] 20 (06/29 0907) BP: (94-142)/(61-87) 142/80 (06/29 0907) SpO2:  [90 %-100 %] 95 % (06/29 0907)  Intake/Output from previous day: 06/28 0701 - 06/29 0700 In: 1846.5 [P.O.:480; I.V.:1169.1; IV Piggyback:197.4] Out: 51 [Urine:1; Blood:50] Intake/Output this shift: No intake/output data recorded.  No results for input(s): HGB in the last 72 hours. No results for input(s): WBC, RBC, HCT, PLT in the last 72 hours. No results for input(s): NA, K, CL, CO2, BUN, CREATININE, GLUCOSE, CALCIUM in the last 72 hours. No results for input(s): LABPT, INR in the last 72 hours.  Intact pulses distally Incision: dressing C/D/I Block wearing off.  Assessment/Plan: 1 Day [place] home with his wife. Instructions and meds are given. Doing very well.   [name] 01/29/2018, 10:11 AM

## 2018-01-30 NOTE — Discharge Summary (Signed)
Orthopedic Discharge Summary        Physician Discharge Summary  Patient ID: [name] MRN: [id number] DOB/AGE: 69-Jun-1952 69 y.o.  Admit date: 01/28/2018 Discharge date: 01/30/2018   Procedures:  Procedure(s) (LRB): RIGHT TOTAL SHOULDER ARTHROPLASTY (Right)  Attending Physician:  Dr. Esmond Plants  Admission Diagnoses:   Right shoulder end staged primary OA  Discharge Diagnoses:  Right shoulder end staged primary OA   Past Medical History:  Diagnosis Date  . Arthritis    oa and ddd  . Back pain, chronic    pt states he has 3 herniated disks--uses fentanyl patch for pain  . Blood transfusion without reported diagnosis 1950   rH negative"at birth only"  . BPH (benign prostatic hyperplasia)    frequent urination, not able to hold urine well  . 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 Colon polyps   . Complication of anesthesia    pt had spinal for a knee replacement--"woke up" during surgery--and sick after surgery  . Depression   . Diverticulosis   . DVT (deep venous thrombosis) ([place]) 2001 or 2002   right leg-required hospitalization x 8 days  . DVT (deep venous thrombosis) ([place]) 1996   right  . Glaucoma    both eyes  . H/O hiatal hernia   . Hearing impaired person, bilateral    bilateral  . Hemorrhoid   . History of IBS   . History of kidney stones    x 1-passed  . Hyperlipidemia 2012  . Hypertension   . Lumbago   . Lumbar post-laminectomy syndrome   . PONV (postoperative nausea and vomiting)   . Sleep apnea    pt uses cpap - setting of 16  . Ulcer 1966  . Ulcerative colitis ([place])   . Wears glasses   . Wears hearing aid     PCP: [name], MD   Discharged Condition: good  Hospital Course:  Patient underwent the above stated procedure on 01/28/2018. Patient tolerated the procedure well and brought to the recovery room in good condition and subsequently to the floor. Patient had an uncomplicated hospital course and was stable  for discharge.   Disposition:  with follow up in 2 weeks   Follow-up Information    [name], MD. Call in 2 weeks.   Specialty:  Orthopedic Surgery Why:  [phone] Contact information: [address] [place] [address] [phone]           Discharge Instructions    Call MD / Call 911   Complete by:  As directed    If you experience chest pain or shortness of breath, CALL 911 and be transported to the hospital emergency room.  If you develope a fever above 101 F, pus (white drainage) or increased drainage or redness at the wound, or calf pain, call your surgeon's office.   Constipation Prevention   Complete by:  As directed    Drink plenty of fluids.  Prune juice may be helpful.  You may use a stool softener, such as Colace (over the counter) 100 mg twice a day.  Use MiraLax (over the counter) for constipation as needed.   Diet - low sodium heart healthy   Complete by:  As directed    Increase activity slowly as tolerated   Complete by:  As directed       Allergies as of 01/29/2018      Reactions   Other Other (See Comments)   "  Adhesives"Pt allergic to some tapes--pt request that we only use paper tape!!      Medication List    STOP taking these medications   cephALEXin 500 MG capsule Commonly known as:  KEFLEX     TAKE these medications   albuterol 108 (90 Base) MCG/ACT inhaler Commonly known as:  PROVENTIL HFA;VENTOLIN HFA Inhale 2 puffs into the lungs every 4 (four) hours as needed for wheezing or shortness of breath.   aspirin 81 MG tablet Take 81 mg by mouth at bedtime.   atorvastatin 40 MG tablet Commonly known as:  LIPITOR Take 40 mg by mouth at bedtime.   CALCIUM + D PO Take 1 tablet by mouth at bedtime.   cetirizine 10 MG tablet Commonly known as:  ZYRTEC Take 10 mg by mouth at bedtime.   dorzolamide-timolol 22.3-6.8 MG/ML ophthalmic solution Commonly known as:  COSOPT Place 1 drop into both eyes 2 (two) times  daily.   fentaNYL 25 MCG/HR patch Commonly known as:  [place] - dosed mcg/hr Place 1 patch onto the skin every 3 (three) days.   Fish Oil 500 MG Caps Take 500 mg by mouth at bedtime.   fluticasone 50 MCG/ACT nasal spray Commonly known as:  FLONASE Place 1 spray into both nostrils at bedtime.   HYDROcodone-acetaminophen 5-325 MG tablet Commonly known as:  NORCO/VICODIN Take 1 tablet by mouth every 6 (six) hours.   lisinopril 20 MG tablet Commonly known as:  PRINIVIL,ZESTRIL TAKE 1 TABLET BY MOUTH ONCE DAILY What changed:    how much to take  how to take this  when to take this   magnesium oxide 400 MG tablet Commonly known as:  MAG-OX Take 400 mg by mouth at bedtime.   methocarbamol 500 MG tablet Commonly known as:  ROBAXIN Take 1 tablet (500 mg total) by mouth 3 (three) times daily as needed.   multivitamin with minerals Tabs tablet Take 1 tablet by mouth at bedtime.   oxyCODONE-acetaminophen 5-325 MG tablet Commonly known as:  PERCOCET Take 1-2 tablets by mouth every 4 (four) hours as needed for severe pain.   traZODone 100 MG tablet Commonly known as:  DESYREL Take 100 mg by mouth at bedtime.   vortioxetine HBr 10 MG Tabs tablet Commonly known as:  TRINTELLIX Take 1 tablet (10 mg total) by mouth daily. What changed:  when to take this   XELPROS 0.005 % Emul Generic drug:  Latanoprost Place 1 drop into both eyes at bedtime.         Signed: Augustin Schooling 01/30/2018, 8:13 AM  [facility] is now [facility] [address], [place], [place], [place] [address] Phone: [place]

## 2018-01-31 ENCOUNTER — Encounter (HOSPITAL_COMMUNITY): Payer: Self-pay | Admitting: Orthopedic Surgery

## 2018-01-31 MED FILL — Thrombin (Recombinant) For Soln 5000 Unit: CUTANEOUS | Qty: 5000 | Status: AC

## 2018-02-10 DIAGNOSIS — Z4789 Encounter for other orthopedic aftercare: Secondary | ICD-10-CM | POA: Diagnosis not present

## 2018-03-08 DIAGNOSIS — M545 Low back pain: Secondary | ICD-10-CM | POA: Diagnosis not present

## 2018-03-08 DIAGNOSIS — G894 Chronic pain syndrome: Secondary | ICD-10-CM | POA: Diagnosis not present

## 2018-03-10 DIAGNOSIS — Z4789 Encounter for other orthopedic aftercare: Secondary | ICD-10-CM | POA: Diagnosis not present

## 2018-03-10 DIAGNOSIS — M19011 Primary osteoarthritis, right shoulder: Secondary | ICD-10-CM | POA: Diagnosis not present

## 2018-03-21 DIAGNOSIS — M25611 Stiffness of right shoulder, not elsewhere classified: Secondary | ICD-10-CM | POA: Diagnosis not present

## 2018-03-24 ENCOUNTER — Other Ambulatory Visit: Payer: Self-pay | Admitting: Family Medicine

## 2018-03-25 DIAGNOSIS — M25611 Stiffness of right shoulder, not elsewhere classified: Secondary | ICD-10-CM | POA: Diagnosis not present

## 2018-03-28 DIAGNOSIS — M25611 Stiffness of right shoulder, not elsewhere classified: Secondary | ICD-10-CM | POA: Diagnosis not present

## 2018-03-31 DIAGNOSIS — M25611 Stiffness of right shoulder, not elsewhere classified: Secondary | ICD-10-CM | POA: Diagnosis not present

## 2018-04-06 DIAGNOSIS — M25611 Stiffness of right shoulder, not elsewhere classified: Secondary | ICD-10-CM | POA: Diagnosis not present

## 2018-04-07 ENCOUNTER — Other Ambulatory Visit: Payer: Medicare Other

## 2018-04-07 DIAGNOSIS — Z13220 Encounter for screening for lipoid disorders: Secondary | ICD-10-CM

## 2018-04-07 DIAGNOSIS — K219 Gastro-esophageal reflux disease without esophagitis: Secondary | ICD-10-CM | POA: Diagnosis not present

## 2018-04-07 DIAGNOSIS — M19012 Primary osteoarthritis, left shoulder: Secondary | ICD-10-CM | POA: Diagnosis not present

## 2018-04-07 DIAGNOSIS — Z125 Encounter for screening for malignant neoplasm of prostate: Secondary | ICD-10-CM | POA: Diagnosis not present

## 2018-04-07 DIAGNOSIS — Z0000 Encounter for general adult medical examination without abnormal findings: Secondary | ICD-10-CM | POA: Diagnosis not present

## 2018-04-07 DIAGNOSIS — M25512 Pain in left shoulder: Secondary | ICD-10-CM | POA: Diagnosis not present

## 2018-04-07 DIAGNOSIS — I10 Essential (primary) hypertension: Secondary | ICD-10-CM

## 2018-04-07 DIAGNOSIS — Z1159 Encounter for screening for other viral diseases: Secondary | ICD-10-CM | POA: Diagnosis not present

## 2018-04-07 DIAGNOSIS — Z4789 Encounter for other orthopedic aftercare: Secondary | ICD-10-CM | POA: Diagnosis not present

## 2018-04-08 DIAGNOSIS — M25611 Stiffness of right shoulder, not elsewhere classified: Secondary | ICD-10-CM | POA: Diagnosis not present

## 2018-04-11 ENCOUNTER — Ambulatory Visit (INDEPENDENT_AMBULATORY_CARE_PROVIDER_SITE_OTHER): Payer: Medicare Other | Admitting: Family Medicine

## 2018-04-11 ENCOUNTER — Encounter: Payer: Self-pay | Admitting: Family Medicine

## 2018-04-11 VITALS — BP 130/78 | HR 60 | Temp 98.2°F | Resp 14 | Ht 67.0 in | Wt 232.0 lb

## 2018-04-11 DIAGNOSIS — Z0000 Encounter for general adult medical examination without abnormal findings: Secondary | ICD-10-CM | POA: Diagnosis not present

## 2018-04-11 DIAGNOSIS — I10 Essential (primary) hypertension: Secondary | ICD-10-CM | POA: Diagnosis not present

## 2018-04-11 DIAGNOSIS — Z23 Encounter for immunization: Secondary | ICD-10-CM | POA: Diagnosis not present

## 2018-04-11 NOTE — Addendum Note (Signed)
Addended by: Shary Decamp B on: 04/11/2018 09:17 AM   Modules accepted: Orders

## 2018-04-11 NOTE — Progress Notes (Signed)
Subjective:    Patient ID: [name], male    DOB: 1951-07-31, 67 y.o.   MRN: [id number]  HPI Patient is here today for complete physical exam. Overall he is doing well.  He had his right shoulder replaced earlier this year and is planning to have his left shoulder replaced this winter.  He is also dealing with glaucoma and may require surgery to relieve his intraocular pressure.  This is causing some blurry vision.  He has bilateral hearing aids due to hearing difficulties.  Otherwise his functional assessment is well.  He denies any problems with falls or depression or memory problems.  He is due for a flu shot today.  The remainder of his immunizations are up-to-date: Immunization History  Administered Date(s) Administered  . Influenza Split 07/04/2012, 03/03/2013  . Influenza,inj,Quad PF,6+ Mos 05/03/2014, 04/30/2015, 04/03/2016, 04/08/2017  . Pneumococcal Conjugate-13 05/03/2014  . Pneumococcal Polysaccharide-23 08/03/2008, 04/03/2016  . Tdap 08/03/2010  . Zoster 08/03/2010   Regarding his cancer screening, his colonoscopy was in 2017 and was normal.  His PSA today is excellent.  He does have occasional diarrhea.  It occurs on a daily basis.  He states that occurs as soon as he eats.  He will have loose stool within 30 minutes to an hour.  He states that he has been diagnosed with IBS in the past.  He is not taking any kind of fiber supplement.  He is not taking any probiotic.  He denies any melena or hematochezia or fevers or chills or weight loss.  He does report gas and bloating Recent Results (from the past 2160 hour(s))  Surgical pcr screen     Status: None   Collection Time: 01/18/18  9:27 AM  Result Value Ref Range   MRSA, PCR NEGATIVE NEGATIVE   Staphylococcus aureus NEGATIVE NEGATIVE    Comment: (NOTE) The Xpert SA Assay (FDA approved for NASAL specimens in patients 74 years of age and older), is one component of a comprehensive surveillance program. It is not intended  to diagnose infection nor to guide or monitor treatment. Performed at [facility], [place] [address] [place], [place] [address]   Basic metabolic panel     Status: Abnormal   Collection Time: 01/18/18  9:28 AM  Result Value Ref Range   Sodium 142 135 - 145 mmol/L   Potassium 4.6 3.5 - 5.1 mmol/L   Chloride 109 101 - 111 mmol/L   CO2 26 22 - 32 mmol/L   Glucose, Bld 103 (H) 65 - 99 mg/dL   BUN 9 6 - 20 mg/dL   Creatinine, Ser 1.03 0.61 - 1.24 mg/dL   Calcium 9.4 8.9 - 10.3 mg/dL   GFR calc non Af Amer >60 >60 mL/min   GFR calc Af Amer >60 >60 mL/min    Comment: (NOTE) The eGFR has been calculated using the CKD EPI equation. This calculation has not been validated in all clinical situations. eGFR's persistently <60 mL/min signify possible Chronic Kidney Disease.    Anion gap 7 5 - 15    Comment: Performed at [facility] [address], [place] [address]  CBC     Status: None   Collection Time: 01/18/18  9:28 AM  Result Value Ref Range   WBC 6.1 4.0 - 10.5 K/uL   RBC 4.78 4.22 - 5.81 MIL/uL   Hemoglobin 15.4 13.0 - 17.0 g/dL   HCT 44.8 39.0 - 52.0 %   MCV 93.7 78.0 -  100.0 fL   MCH 32.2 26.0 - 34.0 pg   MCHC 34.4 30.0 - 36.0 g/dL   RDW 12.2 11.5 - 15.5 %   Platelets 170 150 - 400 K/uL    Comment: Performed at [facility], [place] [address] [place], [place] [address]  Lipid panel     Status: None   Collection Time: 04/07/18  8:15 AM  Result Value Ref Range   Cholesterol 138 <200 mg/dL   HDL 43 >40 mg/dL   Triglycerides 70 <150 mg/dL   LDL Cholesterol (Calc) 80 mg/dL (calc)    Comment: Reference range: <100 . Desirable range <100 mg/dL for primary prevention;   <70 mg/dL for patients with CHD or diabetic patients  with > or = 2 CHD risk factors. Marland Kitchen LDL-C is now calculated using the Martin-Hopkins  calculation, which is a validated novel method providing  better accuracy than the Friedewald equation in the  estimation of LDL-C.  Cresenciano Genre et  al. Annamaria Helling. 8756;433(29): 2061-2068  (http://education.[employer].com/faq/FAQ164)    Total CHOL/HDL Ratio 3.2 <5.0 (calc)   Non-HDL Cholesterol (Calc) 95 <130 mg/dL (calc)    Comment: For patients with diabetes plus 1 major ASCVD risk  factor, treating to a non-HDL-C goal of <100 mg/dL  (LDL-C of <70 mg/dL) is considered a therapeutic  option.   PSA     Status: None   Collection Time: 04/07/18  8:15 AM  Result Value Ref Range   PSA 1.6 < OR = 4.0 ng/mL    Comment: The total PSA value from this assay system is  standardized against the WHO standard. The test  result will be approximately 20% lower when compared  to the equimolar-standardized total PSA (Beckman  Coulter). Comparison of serial PSA results should be  interpreted with this fact in mind. . This test was performed using the Siemens  chemiluminescent method. Values obtained from  different assay methods cannot be used interchangeably. PSA levels, regardless of value, should not be interpreted as absolute evidence of the presence or absence of disease.   Comprehensive metabolic panel     Status: Abnormal   Collection Time: 04/07/18  8:15 AM  Result Value Ref Range   Glucose, Bld 103 (H) 65 - 99 mg/dL    Comment: .            Fasting reference interval . For someone without known diabetes, a glucose value between 100 and 125 mg/dL is consistent with prediabetes and should be confirmed with a follow-up test. .    BUN 20 7 - 25 mg/dL   Creat 1.13 0.70 - 1.25 mg/dL    Comment: For patients >60 years of age, the reference limit for Creatinine is approximately 13% higher for people identified as African-American. .    BUN/Creatinine Ratio NOT APPLICABLE 6 - 22 (calc)   Sodium 138 135 - 146 mmol/L   Potassium 4.3 3.5 - 5.3 mmol/L   Chloride 101 98 - 110 mmol/L   CO2 28 20 - 32 mmol/L   Calcium 9.6 8.6 - 10.3 mg/dL   Total Protein 6.9 6.1 - 8.1 g/dL   Albumin 4.5 3.6 - 5.1 g/dL   Globulin 2.4 1.9 - 3.7 g/dL  (calc)   AG Ratio 1.9 1.0 - 2.5 (calc)   Total Bilirubin 0.9 0.2 - 1.2 mg/dL   Alkaline phosphatase (APISO) 67 40 - 115 U/L   AST 26 10 - 35 U/L   ALT 27 9 - 46 U/L  CBC with Differential/Platelet  Status: None   Collection Time: 04/07/18  8:15 AM  Result Value Ref Range   WBC 6.3 3.8 - 10.8 Thousand/uL   RBC 4.97 4.20 - 5.80 Million/uL   Hemoglobin 15.6 13.2 - 17.1 g/dL   HCT 46.6 38.5 - 50.0 %   MCV 93.8 80.0 - 100.0 fL   MCH 31.4 27.0 - 33.0 pg   MCHC 33.5 32.0 - 36.0 g/dL   RDW 12.9 11.0 - 15.0 %   Platelets 180 140 - 400 Thousand/uL   MPV 10.0 7.5 - 12.5 fL   Neutro Abs 3,320 1,500 - 7,800 cells/uL   Lymphs Abs 1,890 850 - 3,900 cells/uL   WBC mixed population 762 200 - 950 cells/uL   Eosinophils Absolute 239 15 - 500 cells/uL   Basophils Absolute 88 0 - 200 cells/uL   Neutrophils Relative % 52.7 %   Total Lymphocyte 30.0 %   Monocytes Relative 12.1 %   Eosinophils Relative 3.8 %   Basophils Relative 1.4 %    Immunization History  Administered Date(s) Administered  . Influenza Split 07/04/2012, 03/03/2013  . Influenza,inj,Quad PF,6+ Mos 05/03/2014, 04/30/2015, 04/03/2016, 04/08/2017  . Pneumococcal Conjugate-13 05/03/2014  . Pneumococcal Polysaccharide-23 08/03/2008, 04/03/2016  . Tdap 08/03/2010  . Zoster 08/03/2010    Past Medical History:  Diagnosis Date  . Arthritis    oa and ddd  . Back pain, chronic    pt states he has 3 herniated disks--uses fentanyl patch for pain  . Blood transfusion without reported diagnosis 1950   rH negative"at birth only"  . BPH (benign prostatic hyperplasia)    frequent urination, not able to hold urine well  . 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 Colon polyps   . Complication of anesthesia    pt had spinal for a knee replacement--"woke up" during surgery--and sick after surgery  . Depression   . Diverticulosis   . DVT (deep venous thrombosis) ([place]) 2001 or 2002   right leg-required hospitalization x 8  days  . DVT (deep venous thrombosis) ([place]) 1996   right  . Glaucoma    both eyes  . H/O hiatal hernia   . Hearing impaired person, bilateral    bilateral  . Hemorrhoid   . History of IBS   . History of kidney stones    x 1-passed  . Hyperlipidemia 2012  . Hypertension   . Lumbago   . Lumbar post-laminectomy syndrome   . PONV (postoperative nausea and vomiting)   . Sleep apnea    pt uses cpap - setting of 16  . Ulcer 1966  . Ulcerative colitis ([place])   . Wears glasses   . Wears hearing aid    Past Surgical History:  Procedure Laterality Date  . APPENDECTOMY  1993  . BACK SURGERY  2008    bone spur removed   . bilateral carpal tunnel release 2009    . cervical fusion 2011--pt has good range of motion neck    . CHOLECYSTECTOMY  1994  . INSERTION OF MESH Bilateral 07/31/2016   Procedure: INSERTION OF MESH;  Surgeon: [name], MD;  Location: [facility];  Service: General;  Laterality: Bilateral;  . JOINT REPLACEMENT Bilateral    2001 left total knee and 1985 right total knee  . MASS EXCISION Left 01/04/2014   Procedure: LEFT MIDDLE FINGER MASS EXCISION WITH NAILBED REPAIR AND ADVANCEMENT FLAP;  Surgeon: [name], MD;  Location: [facility];  Service: Orthopedics;  Laterality:  Left;  . mass removed from 2nd finger Left aug 2014   squamous carcinoma  . nissen fundoplication [id number]    . PROSTATE SURGERY  2014   TURP  . right wrist fusion 12/12 - pt wearing brace on the wrist-not started physical therapy yet Right    fusion prevent limited ROM to right wrist  . SPINE SURGERY  2009   cervical spine fusion  . TOTAL KNEE REVISION Right 11/17/2013   Procedure: RIGHT KNEE POLYETHYLENE REVISION, bone graft;  Surgeon: [name], MD;  Location: [facility];  Service: Orthopedics;  Laterality: Right;  . TOTAL SHOULDER ARTHROPLASTY Right 01/28/2018   Procedure: RIGHT TOTAL SHOULDER ARTHROPLASTY;  Surgeon: [name], MD;  Location: [facility];  Service: Orthopedics;   Laterality: Right;  . TRANSURETHRAL RESECTION OF PROSTATE  10/09/2011   Procedure: TRANSURETHRAL RESECTION OF THE PROSTATE WITH GYRUS INSTRUMENTS;  Surgeon: Fredricka Bonine, MD;  Location: [facility];  Service: Urology;  Laterality: N/A;  . uvvvp    . XI ROBOTIC ASSISTED INGUINAL HERNIA REPAIR WITH MESH Bilateral 07/31/2016   Procedure: XI ROBOTIC BILATERAL INGUINAL HERNIA REPAIR WITH MESH;  Surgeon: [name], MD;  Location: [facility];  Service: General;  Laterality: Bilateral;   Current Outpatient Medications on File Prior to Visit  Medication Sig Dispense Refill  . albuterol (PROVENTIL HFA;VENTOLIN HFA) 108 (90 Base) MCG/ACT inhaler Inhale 2 puffs into the lungs every 4 (four) hours as needed for wheezing or shortness of breath. 1 Inhaler 0  . aspirin 81 MG tablet Take 81 mg by mouth at bedtime.     Marland Kitchen atorvastatin (LIPITOR) 40 MG tablet TAKE 1 TABLET BY MOUTH ONCE DAILY 90 tablet 3  . Calcium Carbonate-Vitamin D (CALCIUM + D PO) Take 1 tablet by mouth at bedtime.     . cetirizine (ZYRTEC) 10 MG tablet Take 10 mg by mouth at bedtime.    . dorzolamide-timolol (COSOPT) 22.3-6.8 MG/ML ophthalmic solution Place 1 drop into both eyes 2 (two) times daily.     . fentaNYL (DURAGESIC - DOSED MCG/HR) 25 MCG/HR Place 1 patch onto the skin every 3 (three) days.     . fluticasone (FLONASE) 50 MCG/ACT nasal spray Place 1 spray into both nostrils at bedtime.     . Latanoprost (XELPROS) 0.005 % EMUL Place 1 drop into both eyes at bedtime.    Marland Kitchen lisinopril (PRINIVIL,ZESTRIL) 20 MG tablet TAKE 1 TABLET BY MOUTH ONCE DAILY (Patient taking differently: TAKE 1 TABLET BY MOUTH ONCE DAILY AT BEDTIME) 90 tablet 3  . magnesium oxide (MAG-OX) 400 MG tablet Take 400 mg by mouth at bedtime.     . methocarbamol (ROBAXIN) 500 MG tablet Take 1 tablet (500 mg total) by mouth 3 (three) times daily as needed. 60 tablet 1  . Multiple Vitamin (MULTIVITAMIN WITH MINERALS) TABS tablet Take 1 tablet by mouth at bedtime.     .  Omega-3 Fatty Acids (FISH OIL) 500 MG CAPS Take 500 mg by mouth at bedtime.     . traZODone (DESYREL) 100 MG tablet Take 100 mg by mouth at bedtime.     . vortioxetine HBr (TRINTELLIX) 10 MG TABS Take 1 tablet (10 mg total) by mouth daily. (Patient taking differently: Take 10 mg by mouth at bedtime. ) 14 tablet 0   No current facility-administered medications on file prior to visit.    Allergies  Allergen Reactions  . Other Other (See Comments)    "Adhesives"Pt allergic to some tapes--pt request that we only use  paper tape!!   Social History   Socioeconomic History  . Marital status: Single    Spouse name: Not on file  . Number of children: Not on file  . Years of education: Not on file  . Highest education level: Not on file  Occupational History  . Occupation: disabled  Social Needs  . Financial resource strain: Not on file  . Food insecurity:    Worry: Not on file    Inability: Not on file  . Transportation needs:    Medical: Not on file    Non-medical: Not on file  Tobacco Use  . Smoking status: Former Smoker    Packs/day: 1.00    Years: 19.00    Pack years: 19.00    Types: Cigarettes    Last attempt to quit: 08/04/1983    Years since quitting: 34.7  . Smokeless tobacco: Never Used  . Tobacco comment: quit smoking 1985  Substance and Sexual Activity  . Alcohol use: Yes    Alcohol/week: 6.0 standard drinks    Types: 6 Cans of beer per week    Comment: maybe 2 or 3 beers per week  . Drug use: No  . Sexual activity: Not Currently  Lifestyle  . Physical activity:    Days per week: Not on file    Minutes per session: Not on file  . Stress: Not on file  Relationships  . Social connections:    Talks on phone: Not on file    Gets together: Not on file    Attends religious service: Not on file    Active member of club or organization: Not on file    Attends meetings of clubs or organizations: Not on file    Relationship status: Not on file  . Intimate partner  violence:    Fear of current or ex partner: Not on file    Emotionally abused: Not on file    Physically abused: Not on file    Forced sexual activity: Not on file  Other Topics Concern  . Not on file  Social History Narrative  . Not on file   Family History  Problem Relation Age of Onset  . Colon cancer Father   . Bone cancer Father        deceased  . Cancer Father   . Diabetes Father   . Prostate cancer Father   . Arthritis Mother   . Hearing loss Mother   . Hyperlipidemia Mother   . Hypertension Mother   . Deep vein thrombosis Mother   . Diabetes Sister   . Hearing loss Maternal Grandmother   . Diabetes Paternal Grandfather      Review of Systems  All other systems reviewed and are negative.      Objective:   Physical Exam  Constitutional: He is oriented to person, place, and time. He appears well-developed and well-nourished. No distress.  HENT:  Head: Normocephalic and atraumatic.  Right Ear: External ear normal.  Left Ear: External ear normal.  Nose: Nose normal.  Mouth/Throat: Oropharynx is clear and moist. No oropharyngeal exudate.  Eyes: Pupils are equal, round, and reactive to light. Conjunctivae and EOM are normal. Right eye exhibits no discharge. Left eye exhibits no discharge. No scleral icterus.  Neck: Normal range of motion. Neck supple. No JVD present. No tracheal deviation present. No thyromegaly present.  Cardiovascular: Normal rate, regular rhythm, normal heart sounds and intact distal pulses. Exam reveals no gallop and no friction rub.  No murmur heard.  Pulmonary/Chest: Effort normal and breath sounds normal. No stridor. No respiratory distress. He has no wheezes. He has no rales. He exhibits no tenderness.  Abdominal: Soft. Bowel sounds are normal. He exhibits no distension and no mass. There is no tenderness. There is no rebound and no guarding.  Musculoskeletal: Normal range of motion. He exhibits no edema, tenderness or deformity.    Lymphadenopathy:    He has no cervical adenopathy.  Neurological: He is alert and oriented to person, place, and time. He has normal reflexes. No cranial nerve deficit. He exhibits normal muscle tone. Coordination normal.  Skin: Skin is warm. No rash noted. He is not diaphoretic. No erythema. No pallor.  Psychiatric: He has a normal mood and affect. His behavior is normal. Judgment and thought content normal.  Vitals reviewed.         Assessment & Plan:  Routine general medical examination at a health care facility  Benign essential HTN   Overall extremely happy with his lab work.  I recommended a low carbohydrate diet and regular aerobic exercise to try to lower his fasting blood sugar.  His blood pressure and his cholesterol are excellent and his PSA is within normal limits.  His colonoscopy is up-to-date.  I will screen the patient for hepatitis C.  His immunizations are up-to-date.  His depression, fall screen, and functional assessment are within normal limits.  I believe his diarrhea is likely due to IBS.  I recommended a trial of a fiber supplement with a probiotic.  If no better, consider evaluation for gluten allergy as well as pancreatic insufficiency however I suspect IBS

## 2018-04-12 LAB — CBC WITH DIFFERENTIAL/PLATELET
BASOS PCT: 1.4 %
Basophils Absolute: 88 cells/uL (ref 0–200)
EOS ABS: 239 {cells}/uL (ref 15–500)
Eosinophils Relative: 3.8 %
HEMATOCRIT: 46.6 % (ref 38.5–50.0)
Hemoglobin: 15.6 g/dL (ref 13.2–17.1)
Lymphs Abs: 1890 cells/uL (ref 850–3900)
MCH: 31.4 pg (ref 27.0–33.0)
MCHC: 33.5 g/dL (ref 32.0–36.0)
MCV: 93.8 fL (ref 80.0–100.0)
MPV: 10 fL (ref 7.5–12.5)
Monocytes Relative: 12.1 %
NEUTROS ABS: 3320 {cells}/uL (ref 1500–7800)
NEUTROS PCT: 52.7 %
PLATELETS: 180 10*3/uL (ref 140–400)
RBC: 4.97 10*6/uL (ref 4.20–5.80)
RDW: 12.9 % (ref 11.0–15.0)
TOTAL LYMPHOCYTE: 30 %
WBC mixed population: 762 cells/uL (ref 200–950)
WBC: 6.3 10*3/uL (ref 3.8–10.8)

## 2018-04-12 LAB — COMPREHENSIVE METABOLIC PANEL
AG RATIO: 1.9 (calc) (ref 1.0–2.5)
ALT: 27 U/L (ref 9–46)
AST: 26 U/L (ref 10–35)
Albumin: 4.5 g/dL (ref 3.6–5.1)
Alkaline phosphatase (APISO): 67 U/L (ref 40–115)
BUN: 20 mg/dL (ref 7–25)
CALCIUM: 9.6 mg/dL (ref 8.6–10.3)
CO2: 28 mmol/L (ref 20–32)
Chloride: 101 mmol/L (ref 98–110)
Creat: 1.13 mg/dL (ref 0.70–1.25)
Globulin: 2.4 g/dL (calc) (ref 1.9–3.7)
Glucose, Bld: 103 mg/dL — ABNORMAL HIGH (ref 65–99)
Potassium: 4.3 mmol/L (ref 3.5–5.3)
SODIUM: 138 mmol/L (ref 135–146)
Total Bilirubin: 0.9 mg/dL (ref 0.2–1.2)
Total Protein: 6.9 g/dL (ref 6.1–8.1)

## 2018-04-12 LAB — TEST AUTHORIZATION

## 2018-04-12 LAB — LIPID PANEL
CHOLESTEROL: 138 mg/dL (ref <200)
HDL: 43 mg/dL (ref >40)
LDL CHOLESTEROL (CALC): 80 mg/dL
Non-HDL Cholesterol (Calc): 95 mg/dL (calc) (ref <130)
Total CHOL/HDL Ratio: 3.2 (calc) (ref <5.0)
Triglycerides: 70 mg/dL (ref <150)

## 2018-04-12 LAB — HEPATITIS C ANTIBODY
HEP C AB: NONREACTIVE
SIGNAL TO CUT-OFF: 0.02 (ref <1.00)

## 2018-04-12 LAB — PSA: PSA: 1.6 ng/mL (ref <=4.0)

## 2018-04-14 DIAGNOSIS — M25611 Stiffness of right shoulder, not elsewhere classified: Secondary | ICD-10-CM | POA: Diagnosis not present

## 2018-04-19 DIAGNOSIS — M25611 Stiffness of right shoulder, not elsewhere classified: Secondary | ICD-10-CM | POA: Diagnosis not present

## 2018-04-25 DIAGNOSIS — H401131 Primary open-angle glaucoma, bilateral, mild stage: Secondary | ICD-10-CM | POA: Diagnosis not present

## 2018-05-19 DIAGNOSIS — Z9660 Presence of unspecified orthopedic joint implant: Secondary | ICD-10-CM | POA: Diagnosis not present

## 2018-05-19 DIAGNOSIS — M25512 Pain in left shoulder: Secondary | ICD-10-CM | POA: Diagnosis not present

## 2018-05-19 DIAGNOSIS — M19012 Primary osteoarthritis, left shoulder: Secondary | ICD-10-CM | POA: Diagnosis not present

## 2018-05-30 DIAGNOSIS — M25512 Pain in left shoulder: Secondary | ICD-10-CM | POA: Diagnosis not present

## 2018-06-01 DIAGNOSIS — H1045 Other chronic allergic conjunctivitis: Secondary | ICD-10-CM | POA: Diagnosis not present

## 2018-06-08 DIAGNOSIS — Z5181 Encounter for therapeutic drug level monitoring: Secondary | ICD-10-CM | POA: Diagnosis not present

## 2018-06-08 DIAGNOSIS — M542 Cervicalgia: Secondary | ICD-10-CM | POA: Diagnosis not present

## 2018-06-08 DIAGNOSIS — M545 Low back pain: Secondary | ICD-10-CM | POA: Diagnosis not present

## 2018-06-08 DIAGNOSIS — G894 Chronic pain syndrome: Secondary | ICD-10-CM | POA: Diagnosis not present

## 2018-06-08 DIAGNOSIS — Z79891 Long term (current) use of opiate analgesic: Secondary | ICD-10-CM | POA: Diagnosis not present

## 2018-06-08 DIAGNOSIS — Z79899 Other long term (current) drug therapy: Secondary | ICD-10-CM | POA: Diagnosis not present

## 2018-06-14 DIAGNOSIS — H1045 Other chronic allergic conjunctivitis: Secondary | ICD-10-CM | POA: Diagnosis not present

## 2018-06-21 DIAGNOSIS — H1045 Other chronic allergic conjunctivitis: Secondary | ICD-10-CM | POA: Diagnosis not present

## 2018-06-29 DIAGNOSIS — H1045 Other chronic allergic conjunctivitis: Secondary | ICD-10-CM | POA: Diagnosis not present

## 2018-06-29 DIAGNOSIS — H401130 Primary open-angle glaucoma, bilateral, stage unspecified: Secondary | ICD-10-CM | POA: Diagnosis not present

## 2018-06-29 NOTE — H&P (Signed)
Patient's anticipated LOS is less than 2 midnights, meeting these requirements: - Younger than 9 - Lives within 1 hour of care - Has a competent adult at home to recover with post-op recover - NO history of  - Chronic pain requiring opiods  - Diabetes  - Coronary Artery Disease  - Heart failure  - Heart attack  - Stroke  - DVT/VTE  - Cardiac arrhythmia  - Respiratory Failure/COPD  - Renal failure  - Anemia  - Advanced Liver disease       [name] is an 67 y.o. male.    Chief Complaint: left shoulder pain  HPI: Pt is a 67 y.o. male complaining of left shoulder pain for multiple years. Pain had continually increased since the beginning. X-rays in the clinic show end-stage arthritic changes of the left shoulder. Pt has tried various conservative treatments which have failed to alleviate their symptoms, including injections and therapy. Various options are discussed with the patient. Risks, benefits and expectations were discussed with the patient. Patient understand the risks, benefits and expectations and wishes to proceed with surgery.   PCP:  [name], MD  D/C Plans: Home  PMH: Past Medical History:  Diagnosis Date  . Arthritis    oa and ddd  . Back pain, chronic    pt states he has 3 herniated disks--uses fentanyl patch for pain  . Blood transfusion without reported diagnosis 1950   rH negative"at birth only"  . BPH (benign prostatic hyperplasia)    frequent urination, not able to hold urine well  . 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 Colon polyps   . Complication of anesthesia    pt had spinal for a knee replacement--"woke up" during surgery--and sick after surgery  . Depression   . Diverticulosis   . DVT (deep venous thrombosis) ([place]) 2001 or 2002   right leg-required hospitalization x 8 days  . DVT (deep venous thrombosis) ([place]) 1996   right  . Glaucoma    both eyes  . H/O hiatal hernia   . Hearing impaired person,  bilateral    bilateral  . Hemorrhoid   . History of IBS   . History of kidney stones    x 1-passed  . Hyperlipidemia 2012  . Hypertension   . Lumbago   . Lumbar post-laminectomy syndrome   . PONV (postoperative nausea and vomiting)   . Sleep apnea    pt uses cpap - setting of 16  . Ulcer 1966  . Ulcerative colitis ([place])   . Wears glasses   . Wears hearing aid     PSH: Past Surgical History:  Procedure Laterality Date  . APPENDECTOMY  1993  . BACK SURGERY  2008    bone spur removed   . bilateral carpal tunnel release 2009    . cervical fusion 2011--pt has good range of motion neck    . CHOLECYSTECTOMY  1994  . INSERTION OF MESH Bilateral 07/31/2016   Procedure: INSERTION OF MESH;  Surgeon: [name], MD;  Location: [facility];  Service: General;  Laterality: Bilateral;  . JOINT REPLACEMENT Bilateral    2001 left total knee and 1985 right total knee  . MASS EXCISION Left 01/04/2014   Procedure: LEFT MIDDLE FINGER MASS EXCISION WITH NAILBED REPAIR AND ADVANCEMENT FLAP;  Surgeon: [name], MD;  Location: [facility];  Service: Orthopedics;  Laterality: Left;  . mass removed from 2nd finger Left aug 2014  squamous carcinoma  . nissen fundoplication [id number]    . PROSTATE SURGERY  2014   TURP  . right wrist fusion 12/12 - pt wearing brace on the wrist-not started physical therapy yet Right    fusion prevent limited ROM to right wrist  . SPINE SURGERY  2009   cervical spine fusion  . TOTAL KNEE REVISION Right 11/17/2013   Procedure: RIGHT KNEE POLYETHYLENE REVISION, bone graft;  Surgeon: [name], MD;  Location: [facility];  Service: Orthopedics;  Laterality: Right;  . TOTAL SHOULDER ARTHROPLASTY Right 01/28/2018   Procedure: RIGHT TOTAL SHOULDER ARTHROPLASTY;  Surgeon: [name], MD;  Location: [facility];  Service: Orthopedics;  Laterality: Right;  . TRANSURETHRAL RESECTION OF PROSTATE  10/09/2011   Procedure: TRANSURETHRAL RESECTION OF THE PROSTATE WITH GYRUS  INSTRUMENTS;  Surgeon: Fredricka Bonine, MD;  Location: [facility];  Service: Urology;  Laterality: N/A;  . uvvvp    . XI ROBOTIC ASSISTED INGUINAL HERNIA REPAIR WITH MESH Bilateral 07/31/2016   Procedure: XI ROBOTIC BILATERAL INGUINAL HERNIA REPAIR WITH MESH;  Surgeon: [name], MD;  Location: [facility];  Service: General;  Laterality: Bilateral;    Social History:  reports that he quit smoking about 34 years ago. His smoking use included cigarettes. He has a 19.00 pack-year smoking history. He has never used smokeless tobacco. He reports that he drinks about 6.0 standard drinks of alcohol per week. He reports that he does not use drugs.  Allergies:  Allergies  Allergen Reactions  . Other Other (See Comments)    "Adhesives"Pt allergic to some tapes--pt request that we only use paper tape!!    Medications: No current facility-administered medications for this encounter.    Current Outpatient Medications  Medication Sig Dispense Refill  . albuterol (PROVENTIL HFA;VENTOLIN HFA) 108 (90 Base) MCG/ACT inhaler Inhale 2 puffs into the lungs every 4 (four) hours as needed for wheezing or shortness of breath. 1 Inhaler 0  . aspirin 81 MG tablet Take 81 mg by mouth at bedtime.     Marland Kitchen atorvastatin (LIPITOR) 40 MG tablet TAKE 1 TABLET BY MOUTH ONCE DAILY 90 tablet 3  . Calcium Carbonate-Vitamin D (CALCIUM + D PO) Take 1 tablet by mouth at bedtime.     . cetirizine (ZYRTEC) 10 MG tablet Take 10 mg by mouth at bedtime.    . dorzolamide-timolol (COSOPT) 22.3-6.8 MG/ML ophthalmic solution Place 1 drop into both eyes 2 (two) times daily.     . fentaNYL (DURAGESIC - DOSED MCG/HR) 25 MCG/HR Place 1 patch onto the skin every 3 (three) days.     . fluticasone (FLONASE) 50 MCG/ACT nasal spray Place 1 spray into both nostrils at bedtime.     . Latanoprost (XELPROS) 0.005 % EMUL Place 1 drop into both eyes at bedtime.    Marland Kitchen lisinopril (PRINIVIL,ZESTRIL) 20 MG tablet TAKE 1 TABLET BY MOUTH ONCE DAILY  (Patient taking differently: TAKE 1 TABLET BY MOUTH ONCE DAILY AT BEDTIME) 90 tablet 3  . magnesium oxide (MAG-OX) 400 MG tablet Take 400 mg by mouth at bedtime.     . methocarbamol (ROBAXIN) 500 MG tablet Take 1 tablet (500 mg total) by mouth 3 (three) times daily as needed. 60 tablet 1  . Multiple Vitamin (MULTIVITAMIN WITH MINERALS) TABS tablet Take 1 tablet by mouth at bedtime.     . Omega-3 Fatty Acids (FISH OIL) 500 MG CAPS Take 500 mg by mouth at bedtime.     . traZODone (DESYREL) 100 MG tablet Take 100  mg by mouth at bedtime.     . vortioxetine HBr (TRINTELLIX) 10 MG TABS Take 1 tablet (10 mg total) by mouth daily. (Patient taking differently: Take 10 mg by mouth at bedtime. ) 14 tablet 0    No results found for this or any previous visit (from the past 48 hour(s)). No results found.  ROS: Pain with rom of the left upper extremity  Physical Exam: Alert and oriented 67 y.o. male in no acute distress Cranial nerves 2-12 intact Cervical spine: full rom with no tenderness, nv intact distally Chest: active breath sounds bilaterally, no wheeze rhonchi or rales Heart: regular rate and rhythm, no murmur Abd: non tender non distended with active bowel sounds Hip is stable with rom  Left shoulder crepitus with rom Strength 4.5/5 with ER and IR  No rashes or edema  Assessment/Plan Assessment: left shoulder end stage osteoarthritis  Plan:  Patient will undergo a left total shoulder by Dr. Veverly Fells at [facility]. Risks benefits and expectations were discussed with the patient. Patient understand risks, benefits and expectations and wishes to proceed. Preoperative templating of the joint replacement has been completed, documented, and submitted to the Operating Room personnel in order to optimize intra-operative equipment management.   Merla Riches PA-C, MPAS [facility] is now [employer] [address], [place], [place], [place] [address] Phone:  [phone] [email or web address] Facebook  [employer]

## 2018-07-06 NOTE — Pre-Procedure Instructions (Signed)
[name]  07/06/2018      [facility] - [place], [place] - [address] [place] [address] Phone: [phone] Fax: [phone]    Your procedure is scheduled on Friday December 13th.  Report to [facility] Admitting at 11:00 A.M.  Call this number if you have problems the morning of surgery:  [phone]   Remember:  Do not eat or drink after midnight.     Take these medicines the morning of surgery with A SIP OF WATER  albuterol (PROVENTIL HFA;VENTOLIN HFA) if needed- bring with you to the hospital fluticasone [facility]) if needed Olopatadine HCl (PAZEO) eye drops if needed  Follow your surgeon's instructions on when to stop Asprin.  If no instructions were given by your surgeon then you will need to call the office to get those instructions.    7 days prior to surgery STOP taking any Aspirin(unless otherwise instructed by your surgeon), Aleve, Naproxen, Ibuprofen, Motrin, Advil, Goody's, BC's, all herbal medications, fish oil, and all vitamins     Do not wear jewelry  Do not wear lotions, powders, or colognes, or deodorant.  Do not shave 48 hours prior to surgery.  Men may shave face and neck.  Do not bring valuables to the hospital.  [facility] is not responsible for any belongings or valuables.  Contacts, eyeglasses, hearing aids, dentures or bridgework may not be worn into surgery.  Leave your suitcase in the car.  After surgery it may be brought to your room.  For patients admitted to the hospital, discharge time will be determined by your treatment team.  Patients discharged the day of surgery will not be allowed to drive home.   [facility]- Preparing For Surgery  Before surgery, you can play an important role. Because skin is not sterile, your skin needs to be as free of germs as possible. You can reduce the number of germs on your skin by washing with CHG (chlorahexidine gluconate) Soap before surgery.   CHG is an antiseptic cleaner which kills germs and bonds with the skin to continue killing germs even after washing.    Oral Hygiene is also important to reduce your risk of infection.  Remember - BRUSH YOUR TEETH THE MORNING OF SURGERY WITH YOUR REGULAR TOOTHPASTE  Please do not use if you have an allergy to CHG or antibacterial soaps. If your skin becomes reddened/irritated stop using the CHG.  Do not shave (including legs and underarms) for at least 48 hours prior to first CHG shower. It is OK to shave your face.  Please follow these instructions carefully.   1. Shower the NIGHT BEFORE SURGERY and the MORNING OF SURGERY with CHG.   2. If you chose to wash your hair, wash your hair first as usual with your normal shampoo.  3. After you shampoo, rinse your hair and body thoroughly to remove the shampoo.  4. Use CHG as you would any other liquid soap. You can apply CHG directly to the skin and wash gently with a scrungie or a clean washcloth.   5. Apply the CHG Soap to your body ONLY FROM THE NECK DOWN.  Do not use on open wounds or open sores. Avoid contact with your eyes, ears, mouth and genitals (private parts). Wash Face and genitals (private parts)  with your normal soap.  6. Wash thoroughly, paying special attention to the area where your surgery will be performed.  7. Thoroughly rinse  your body with warm water from the neck down.  8. DO NOT shower/wash with your normal soap after using and rinsing off the CHG Soap.  9. Pat yourself dry with a CLEAN TOWEL.  10. Wear CLEAN PAJAMAS to bed the night before surgery, wear comfortable clothes the morning of surgery  11. Place CLEAN SHEETS on your bed the night of your first shower and DO NOT SLEEP WITH PETS.    Day of Surgery: Shower as stated above. Do not apply any deodorants/lotions.  Please wear clean clothes to the hospital/surgery center.   Remember to brush your teeth WITH YOUR REGULAR TOOTHPASTE.  Please read over the  following fact sheets that you were given.

## 2018-07-07 ENCOUNTER — Encounter (HOSPITAL_COMMUNITY): Payer: Self-pay

## 2018-07-07 ENCOUNTER — Encounter (HOSPITAL_COMMUNITY)
Admission: RE | Admit: 2018-07-07 | Discharge: 2018-07-07 | Disposition: A | Discharge status: Home / Self Care | Payer: Medicare Other | Source: Ambulatory Visit | Attending: Orthopedic Surgery | Admitting: Orthopedic Surgery

## 2018-07-07 DIAGNOSIS — Z01812 Encounter for preprocedural laboratory examination: Secondary | ICD-10-CM | POA: Diagnosis not present

## 2018-07-07 HISTORY — DX: Unspecified cataract: H26.9

## 2018-07-07 LAB — BASIC METABOLIC PANEL
ANION GAP: 10 (ref 5–15)
BUN: 13 mg/dL (ref 8–23)
CALCIUM: 9.5 mg/dL (ref 8.9–10.3)
CO2: 25 mmol/L (ref 22–32)
Chloride: 105 mmol/L (ref 98–111)
Creatinine, Ser: 1.11 mg/dL (ref 0.61–1.24)
GFR calc Af Amer: >60 mL/min (ref >60)
GLUCOSE: 103 mg/dL — AB (ref 70–99)
Potassium: 4.2 mmol/L (ref 3.5–5.1)
Sodium: 140 mmol/L (ref 135–145)

## 2018-07-07 LAB — CBC
HCT: 46 % (ref 39.0–52.0)
Hemoglobin: 15.2 g/dL (ref 13.0–17.0)
MCH: 30.8 pg (ref 26.0–34.0)
MCHC: 33 g/dL (ref 30.0–36.0)
MCV: 93.1 fL (ref 80.0–100.0)
NRBC: 0 % (ref 0.0–0.2)
PLATELETS: 189 10*3/uL (ref 150–400)
RBC: 4.94 MIL/uL (ref 4.22–5.81)
RDW: 12.3 % (ref 11.5–15.5)
WBC: 5.9 10*3/uL (ref 4.0–10.5)

## 2018-07-07 LAB — SURGICAL PCR SCREEN
MRSA, PCR: NEGATIVE
STAPHYLOCOCCUS AUREUS: NEGATIVE

## 2018-07-07 NOTE — Progress Notes (Signed)
PCP: Jenna Luo @ [facility]  Cardiologist: Dr. Mallie Mussel Everleigh Colclasure-hasn't seen in several yrs.  Last sleep study 4-5 yrs. Ago--doesn't remember where.  Pt. States he is to stop aspirin Sat. 07/09/18.

## 2018-07-15 ENCOUNTER — Other Ambulatory Visit: Payer: Self-pay

## 2018-07-15 ENCOUNTER — Inpatient Hospital Stay (HOSPITAL_COMMUNITY): Payer: Medicare Other

## 2018-07-15 ENCOUNTER — Inpatient Hospital Stay (HOSPITAL_COMMUNITY): Payer: Medicare Other | Admitting: Certified Registered"

## 2018-07-15 ENCOUNTER — Encounter (HOSPITAL_COMMUNITY)
Admission: RE | Disposition: A | Discharge status: Home / Self Care | Payer: Self-pay | Source: Home / Self Care | Attending: Orthopedic Surgery

## 2018-07-15 ENCOUNTER — Encounter (HOSPITAL_COMMUNITY): Payer: Self-pay | Admitting: Surgery

## 2018-07-15 ENCOUNTER — Inpatient Hospital Stay (HOSPITAL_COMMUNITY)
Admission: RE | Admit: 2018-07-15 | Discharge: 2018-07-16 | DRG: 483 | Disposition: A | Discharge status: Home / Self Care | Payer: Medicare Other | Attending: Orthopedic Surgery | Admitting: Orthopedic Surgery

## 2018-07-15 DIAGNOSIS — Z9079 Acquired absence of other genital organ(s): Secondary | ICD-10-CM

## 2018-07-15 DIAGNOSIS — G8929 Other chronic pain: Secondary | ICD-10-CM | POA: Diagnosis not present

## 2018-07-15 DIAGNOSIS — Z981 Arthrodesis status: Secondary | ICD-10-CM

## 2018-07-15 DIAGNOSIS — M19012 Primary osteoarthritis, left shoulder: Principal | ICD-10-CM | POA: Diagnosis present

## 2018-07-15 DIAGNOSIS — Z96612 Presence of left artificial shoulder joint: Secondary | ICD-10-CM | POA: Diagnosis not present

## 2018-07-15 DIAGNOSIS — Z86718 Personal history of other venous thrombosis and embolism: Secondary | ICD-10-CM | POA: Diagnosis not present

## 2018-07-15 DIAGNOSIS — H9193 Unspecified hearing loss, bilateral: Secondary | ICD-10-CM | POA: Diagnosis not present

## 2018-07-15 DIAGNOSIS — I10 Essential (primary) hypertension: Secondary | ICD-10-CM | POA: Diagnosis present

## 2018-07-15 DIAGNOSIS — Z91048 Other nonmedicinal substance allergy status: Secondary | ICD-10-CM

## 2018-07-15 DIAGNOSIS — Z6836 Body mass index (BMI) 36.0-36.9, adult: Secondary | ICD-10-CM

## 2018-07-15 DIAGNOSIS — Z87442 Personal history of urinary calculi: Secondary | ICD-10-CM

## 2018-07-15 DIAGNOSIS — G4733 Obstructive sleep apnea (adult) (pediatric): Secondary | ICD-10-CM | POA: Diagnosis present

## 2018-07-15 DIAGNOSIS — E785 Hyperlipidemia, unspecified: Secondary | ICD-10-CM | POA: Diagnosis not present

## 2018-07-15 DIAGNOSIS — Z96653 Presence of artificial knee joint, bilateral: Secondary | ICD-10-CM | POA: Diagnosis present

## 2018-07-15 DIAGNOSIS — H409 Unspecified glaucoma: Secondary | ICD-10-CM | POA: Diagnosis present

## 2018-07-15 DIAGNOSIS — Z8719 Personal history of other diseases of the digestive system: Secondary | ICD-10-CM

## 2018-07-15 DIAGNOSIS — N400 Enlarged prostate without lower urinary tract symptoms: Secondary | ICD-10-CM | POA: Diagnosis not present

## 2018-07-15 DIAGNOSIS — Z974 Presence of external hearing-aid: Secondary | ICD-10-CM | POA: Diagnosis not present

## 2018-07-15 DIAGNOSIS — M25712 Osteophyte, left shoulder: Secondary | ICD-10-CM | POA: Diagnosis present

## 2018-07-15 DIAGNOSIS — M961 Postlaminectomy syndrome, not elsewhere classified: Secondary | ICD-10-CM | POA: Diagnosis present

## 2018-07-15 DIAGNOSIS — Z79899 Other long term (current) drug therapy: Secondary | ICD-10-CM

## 2018-07-15 DIAGNOSIS — Z85828 Personal history of other malignant neoplasm of skin: Secondary | ICD-10-CM | POA: Diagnosis not present

## 2018-07-15 DIAGNOSIS — K5190 Ulcerative colitis, unspecified, without complications: Secondary | ICD-10-CM | POA: Diagnosis present

## 2018-07-15 DIAGNOSIS — Z471 Aftercare following joint replacement surgery: Secondary | ICD-10-CM | POA: Diagnosis not present

## 2018-07-15 DIAGNOSIS — Z96611 Presence of right artificial shoulder joint: Secondary | ICD-10-CM | POA: Diagnosis present

## 2018-07-15 DIAGNOSIS — Z86010 Personal history of colonic polyps: Secondary | ICD-10-CM

## 2018-07-15 DIAGNOSIS — Z87891 Personal history of nicotine dependence: Secondary | ICD-10-CM | POA: Diagnosis not present

## 2018-07-15 DIAGNOSIS — Z9049 Acquired absence of other specified parts of digestive tract: Secondary | ICD-10-CM | POA: Diagnosis not present

## 2018-07-15 DIAGNOSIS — Z7951 Long term (current) use of inhaled steroids: Secondary | ICD-10-CM

## 2018-07-15 DIAGNOSIS — F329 Major depressive disorder, single episode, unspecified: Secondary | ICD-10-CM | POA: Diagnosis not present

## 2018-07-15 DIAGNOSIS — Z7982 Long term (current) use of aspirin: Secondary | ICD-10-CM

## 2018-07-15 DIAGNOSIS — Z79891 Long term (current) use of opiate analgesic: Secondary | ICD-10-CM

## 2018-07-15 DIAGNOSIS — Z9989 Dependence on other enabling machines and devices: Secondary | ICD-10-CM

## 2018-07-15 DIAGNOSIS — E669 Obesity, unspecified: Secondary | ICD-10-CM | POA: Diagnosis present

## 2018-07-15 DIAGNOSIS — K219 Gastro-esophageal reflux disease without esophagitis: Secondary | ICD-10-CM | POA: Diagnosis not present

## 2018-07-15 HISTORY — PX: TOTAL SHOULDER ARTHROPLASTY: SHX126

## 2018-07-15 LAB — MRSA PCR SCREENING: MRSA by PCR: NEGATIVE

## 2018-07-15 SURGERY — ARTHROPLASTY, SHOULDER, TOTAL
Anesthesia: General | Laterality: Left

## 2018-07-15 MED ORDER — PHENOL 1.4 % MT LIQD: 1.0000 | OROMUCOSAL | Status: DC | PRN

## 2018-07-15 MED ORDER — VORTIOXETINE HBR 10 MG PO TABS
10.0000 mg | ORAL_TABLET | Freq: Every day | ORAL | Status: DC
  Administered 2018-07-15: 10 mg via ORAL
  Filled 2018-07-15: qty 1

## 2018-07-15 MED ORDER — DOCUSATE SODIUM 100 MG PO CAPS
100.0000 mg | ORAL_CAPSULE | Freq: Two times a day (BID) | ORAL | Status: DC
  Administered 2018-07-15 – 2018-07-16 (×2): 100 mg via ORAL
  Filled 2018-07-15 (×2): qty 1

## 2018-07-15 MED ORDER — ACETAMINOPHEN 325 MG PO TABS: 325.0000 mg | ORAL_TABLET | Freq: Four times a day (QID) | ORAL | Status: DC | PRN

## 2018-07-15 MED ORDER — LATANOPROST 0.005 % OP SOLN
1.0000 [drp] | Freq: Every day | OPHTHALMIC | Status: DC
  Administered 2018-07-15: 1 [drp] via OPHTHALMIC
  Filled 2018-07-15: qty 2.5

## 2018-07-15 MED ORDER — PROPOFOL 1000 MG/100ML IV EMUL
INTRAVENOUS | Status: AC
  Filled 2018-07-15: qty 100

## 2018-07-15 MED ORDER — BUPIVACAINE-EPINEPHRINE 0.25% -1:200000 IJ SOLN
INTRAMUSCULAR | Status: DC | PRN
  Administered 2018-07-15: 10 mL

## 2018-07-15 MED ORDER — OXYCODONE HCL 5 MG PO TABS: 5.0000 mg | ORAL_TABLET | ORAL | Status: DC | PRN

## 2018-07-15 MED ORDER — THROMBIN (RECOMBINANT) 5000 UNITS EX SOLR
CUTANEOUS | Status: AC
  Filled 2018-07-15: qty 5000

## 2018-07-15 MED ORDER — POLYETHYLENE GLYCOL 3350 17 G PO PACK: 17.0000 g | PACK | Freq: Every day | ORAL | Status: DC | PRN

## 2018-07-15 MED ORDER — FENTANYL CITRATE (PF) 250 MCG/5ML IJ SOLN
INTRAMUSCULAR | Status: DC | PRN
  Administered 2018-07-15: 100 ug via INTRAVENOUS

## 2018-07-15 MED ORDER — TRAZODONE HCL 50 MG PO TABS
100.0000 mg | ORAL_TABLET | Freq: Every day | ORAL | Status: DC
  Administered 2018-07-15: 100 mg via ORAL
  Filled 2018-07-15: qty 2

## 2018-07-15 MED ORDER — ONDANSETRON HCL 4 MG/2ML IJ SOLN: 4.0000 mg | Freq: Once | INTRAMUSCULAR | Status: DC | PRN

## 2018-07-15 MED ORDER — CHLORHEXIDINE GLUCONATE 4 % EX LIQD: 60.0000 mL | Freq: Once | CUTANEOUS | Status: DC

## 2018-07-15 MED ORDER — FENTANYL CITRATE (PF) 100 MCG/2ML IJ SOLN: 25.0000 ug | INTRAMUSCULAR | Status: DC | PRN

## 2018-07-15 MED ORDER — 0.9 % SODIUM CHLORIDE (POUR BTL) OPTIME
TOPICAL | Status: DC | PRN
  Administered 2018-07-15: 1000 mL

## 2018-07-15 MED ORDER — DEXAMETHASONE SODIUM PHOSPHATE 10 MG/ML IJ SOLN
INTRAMUSCULAR | Status: DC | PRN
  Administered 2018-07-15: 10 mg via INTRAVENOUS

## 2018-07-15 MED ORDER — METHOCARBAMOL 500 MG PO TABS: 500.0000 mg | ORAL_TABLET | Freq: Three times a day (TID) | ORAL | 1 refills | Status: DC | PRN

## 2018-07-15 MED ORDER — FENTANYL CITRATE (PF) 100 MCG/2ML IJ SOLN
100.0000 ug | Freq: Once | INTRAMUSCULAR | Status: AC
  Administered 2018-07-15: 100 ug via INTRAVENOUS

## 2018-07-15 MED ORDER — FENTANYL CITRATE (PF) 100 MCG/2ML IJ SOLN
INTRAMUSCULAR | Status: AC
  Administered 2018-07-15: 100 ug via INTRAVENOUS
  Filled 2018-07-15: qty 2

## 2018-07-15 MED ORDER — OLOPATADINE HCL 0.1 % OP SOLN
1.0000 [drp] | Freq: Every day | OPHTHALMIC | Status: DC
  Administered 2018-07-15 – 2018-07-16 (×2): 1 [drp] via OPHTHALMIC
  Filled 2018-07-15: qty 5

## 2018-07-15 MED ORDER — DEXAMETHASONE SODIUM PHOSPHATE 10 MG/ML IJ SOLN
INTRAMUSCULAR | Status: AC
  Filled 2018-07-15: qty 1

## 2018-07-15 MED ORDER — ATORVASTATIN CALCIUM 40 MG PO TABS
40.0000 mg | ORAL_TABLET | Freq: Every day | ORAL | Status: DC
  Administered 2018-07-15 – 2018-07-16 (×2): 40 mg via ORAL
  Filled 2018-07-15 (×2): qty 1

## 2018-07-15 MED ORDER — SODIUM CHLORIDE 0.9 % IV SOLN
INTRAVENOUS | Status: DC
  Administered 2018-07-15: 20:00:00 via INTRAVENOUS

## 2018-07-15 MED ORDER — LISINOPRIL 20 MG PO TABS
20.0000 mg | ORAL_TABLET | Freq: Every day | ORAL | Status: DC
  Administered 2018-07-15 – 2018-07-16 (×2): 20 mg via ORAL
  Filled 2018-07-15 (×2): qty 1

## 2018-07-15 MED ORDER — ASPIRIN 81 MG PO CHEW
81.0000 mg | CHEWABLE_TABLET | Freq: Every day | ORAL | Status: DC
  Administered 2018-07-15: 81 mg via ORAL
  Filled 2018-07-15: qty 1

## 2018-07-15 MED ORDER — ONDANSETRON HCL 4 MG/2ML IJ SOLN
INTRAMUSCULAR | Status: AC
  Filled 2018-07-15: qty 2

## 2018-07-15 MED ORDER — MENTHOL 3 MG MT LOZG: 1.0000 | LOZENGE | OROMUCOSAL | Status: DC | PRN

## 2018-07-15 MED ORDER — METHOCARBAMOL 500 MG PO TABS: 500.0000 mg | ORAL_TABLET | Freq: Four times a day (QID) | ORAL | Status: DC | PRN

## 2018-07-15 MED ORDER — ACETAMINOPHEN 500 MG PO TABS: 1000.0000 mg | ORAL_TABLET | Freq: Four times a day (QID) | ORAL | Status: DC | PRN

## 2018-07-15 MED ORDER — CEFAZOLIN SODIUM-DEXTROSE 2-4 GM/100ML-% IV SOLN
INTRAVENOUS | Status: AC
  Filled 2018-07-15: qty 100

## 2018-07-15 MED ORDER — MAGNESIUM OXIDE 400 (241.3 MG) MG PO TABS
400.0000 mg | ORAL_TABLET | Freq: Every day | ORAL | Status: DC
  Administered 2018-07-15: 400 mg via ORAL
  Filled 2018-07-15: qty 1

## 2018-07-15 MED ORDER — ONDANSETRON HCL 4 MG/2ML IJ SOLN
INTRAMUSCULAR | Status: DC | PRN
  Administered 2018-07-15: 4 mg via INTRAVENOUS

## 2018-07-15 MED ORDER — FENTANYL CITRATE (PF) 250 MCG/5ML IJ SOLN
INTRAMUSCULAR | Status: AC
  Filled 2018-07-15: qty 5

## 2018-07-15 MED ORDER — ALBUTEROL SULFATE (2.5 MG/3ML) 0.083% IN NEBU: 3.0000 mL | INHALATION_SOLUTION | RESPIRATORY_TRACT | Status: DC | PRN

## 2018-07-15 MED ORDER — THROMBIN 5000 UNITS EX SOLR
CUTANEOUS | Status: DC | PRN
  Administered 2018-07-15: 5000 [IU] via TOPICAL

## 2018-07-15 MED ORDER — CEFAZOLIN SODIUM-DEXTROSE 2-4 GM/100ML-% IV SOLN
2.0000 g | INTRAVENOUS | Status: AC
  Administered 2018-07-15: 2 g via INTRAVENOUS

## 2018-07-15 MED ORDER — FENTANYL 25 MCG/HR TD PT72: 25.0000 ug | MEDICATED_PATCH | TRANSDERMAL | Status: DC

## 2018-07-15 MED ORDER — LACTATED RINGERS IV SOLN
INTRAVENOUS | Status: DC
  Administered 2018-07-15 (×2): via INTRAVENOUS

## 2018-07-15 MED ORDER — METOCLOPRAMIDE HCL 10 MG PO TABS: 5.0000 mg | ORAL_TABLET | Freq: Three times a day (TID) | ORAL | Status: DC | PRN

## 2018-07-15 MED ORDER — LORATADINE 10 MG PO TABS
10.0000 mg | ORAL_TABLET | Freq: Every day | ORAL | Status: DC
  Administered 2018-07-15 – 2018-07-16 (×2): 10 mg via ORAL
  Filled 2018-07-15 (×2): qty 1

## 2018-07-15 MED ORDER — PROPOFOL 10 MG/ML IV BOLUS
INTRAVENOUS | Status: AC
  Filled 2018-07-15: qty 20

## 2018-07-15 MED ORDER — ONDANSETRON HCL 4 MG/2ML IJ SOLN: 4.0000 mg | Freq: Four times a day (QID) | INTRAMUSCULAR | Status: DC | PRN

## 2018-07-15 MED ORDER — OMEGA-3-ACID ETHYL ESTERS 1 G PO CAPS
1.0000 g | ORAL_CAPSULE | Freq: Every day | ORAL | Status: DC
  Administered 2018-07-15 – 2018-07-16 (×2): 1 g via ORAL
  Filled 2018-07-15 (×2): qty 1

## 2018-07-15 MED ORDER — MEPERIDINE HCL 50 MG/ML IJ SOLN: 6.2500 mg | INTRAMUSCULAR | Status: DC | PRN

## 2018-07-15 MED ORDER — ONDANSETRON HCL 4 MG PO TABS: 4.0000 mg | ORAL_TABLET | Freq: Four times a day (QID) | ORAL | Status: DC | PRN

## 2018-07-15 MED ORDER — OXYCODONE HCL 5 MG/5ML PO SOLN: 5.0000 mg | Freq: Once | ORAL | Status: DC | PRN

## 2018-07-15 MED ORDER — ROCURONIUM BROMIDE 50 MG/5ML IV SOSY
PREFILLED_SYRINGE | INTRAVENOUS | Status: AC
  Filled 2018-07-15: qty 5

## 2018-07-15 MED ORDER — ADULT MULTIVITAMIN W/MINERALS CH
1.0000 | ORAL_TABLET | Freq: Every day | ORAL | Status: DC
  Administered 2018-07-15: 1 via ORAL
  Filled 2018-07-15: qty 1

## 2018-07-15 MED ORDER — BISACODYL 10 MG RE SUPP: 10.0000 mg | Freq: Every day | RECTAL | Status: DC | PRN

## 2018-07-15 MED ORDER — PROPOFOL 10 MG/ML IV BOLUS
INTRAVENOUS | Status: DC | PRN
  Administered 2018-07-15: 50 mg via INTRAVENOUS
  Administered 2018-07-15: 200 mg via INTRAVENOUS

## 2018-07-15 MED ORDER — SUCCINYLCHOLINE CHLORIDE 200 MG/10ML IV SOSY
PREFILLED_SYRINGE | INTRAVENOUS | Status: DC | PRN
  Administered 2018-07-15: 120 mg via INTRAVENOUS

## 2018-07-15 MED ORDER — CEFAZOLIN SODIUM-DEXTROSE 2-4 GM/100ML-% IV SOLN
2.0000 g | Freq: Four times a day (QID) | INTRAVENOUS | Status: AC
  Administered 2018-07-15 – 2018-07-16 (×3): 2 g via INTRAVENOUS
  Filled 2018-07-15 (×3): qty 100

## 2018-07-15 MED ORDER — METHOCARBAMOL 1000 MG/10ML IJ SOLN
500.0000 mg | Freq: Four times a day (QID) | INTRAVENOUS | Status: DC | PRN
  Filled 2018-07-15: qty 5

## 2018-07-15 MED ORDER — MIDAZOLAM HCL 2 MG/2ML IJ SOLN
INTRAMUSCULAR | Status: AC
  Administered 2018-07-15: 2 mg via INTRAVENOUS
  Filled 2018-07-15: qty 2

## 2018-07-15 MED ORDER — OXYCODONE HCL 5 MG PO TABS: 5.0000 mg | ORAL_TABLET | Freq: Once | ORAL | Status: DC | PRN

## 2018-07-15 MED ORDER — MIDAZOLAM HCL 2 MG/2ML IJ SOLN
2.0000 mg | Freq: Once | INTRAMUSCULAR | Status: AC
  Administered 2018-07-15: 2 mg via INTRAVENOUS

## 2018-07-15 MED ORDER — METOCLOPRAMIDE HCL 5 MG/ML IJ SOLN: 5.0000 mg | Freq: Three times a day (TID) | INTRAMUSCULAR | Status: DC | PRN

## 2018-07-15 MED ORDER — FLUTICASONE PROPIONATE 50 MCG/ACT NA SUSP
1.0000 | Freq: Every day | NASAL | Status: DC | PRN
  Filled 2018-07-15: qty 16

## 2018-07-15 MED ORDER — BUPIVACAINE-EPINEPHRINE (PF) 0.25% -1:200000 IJ SOLN
INTRAMUSCULAR | Status: AC
  Filled 2018-07-15: qty 30

## 2018-07-15 MED ORDER — HEMOSTATIC AGENTS (NO CHARGE) OPTIME
TOPICAL | Status: DC | PRN
  Administered 2018-07-15: 1 via TOPICAL

## 2018-07-15 MED ORDER — HYDROMORPHONE HCL 1 MG/ML IJ SOLN: 0.5000 mg | INTRAMUSCULAR | Status: DC | PRN

## 2018-07-15 MED ORDER — SODIUM CHLORIDE 0.9 % IV SOLN
INTRAVENOUS | Status: DC | PRN
  Administered 2018-07-15: 15:00:00 via INTRAVENOUS
  Administered 2018-07-15: 40 ug/min via INTRAVENOUS

## 2018-07-15 MED ORDER — OXYCODONE-ACETAMINOPHEN 5-325 MG PO TABS: 1.0000 | ORAL_TABLET | ORAL | 0 refills | Status: DC | PRN

## 2018-07-15 SURGICAL SUPPLY — 71 items
BIT DRILL 5/64X5 DISP (BIT) ×3 IMPLANT
BLADE SAW SAG 73X25 THK (BLADE) ×2
BLADE SAW SGTL 73X25 THK (BLADE) ×1 IMPLANT
BODY UNITE ANATOMIC SZ12 (Miscellaneous) ×2 IMPLANT
CEMENT HV SMART SET (Cement) ×2 IMPLANT
CLOSURE WOUND 1/2 X4 (GAUZE/BANDAGES/DRESSINGS) ×1
COVER SURGICAL LIGHT HANDLE (MISCELLANEOUS) ×3 IMPLANT
COVER WAND RF STERILE (DRAPES) ×3 IMPLANT
DRAPE INCISE IOBAN 66X45 STRL (DRAPES) ×3 IMPLANT
DRAPE ORTHO SPLIT 77X108 STRL (DRAPES) ×6
DRAPE SURG ORHT 6 SPLT 77X108 (DRAPES) ×2 IMPLANT
DRAPE U-SHAPE 47X51 STRL (DRAPES) ×3 IMPLANT
DRSG ADAPTIC 3X8 NADH LF (GAUZE/BANDAGES/DRESSINGS) ×3 IMPLANT
DRSG PAD ABDOMINAL 8X10 ST (GAUZE/BANDAGES/DRESSINGS) ×6 IMPLANT
DURAPREP 26ML APPLICATOR (WOUND CARE) ×3 IMPLANT
ELECT BLADE 4.0 EZ CLEAN MEGAD (MISCELLANEOUS) ×3
ELECT NDL TIP 2.8 STRL (NEEDLE) ×1 IMPLANT
ELECT NEEDLE TIP 2.8 STRL (NEEDLE) ×3 IMPLANT
ELECT REM PT RETURN 9FT ADLT (ELECTROSURGICAL) ×3
ELECTRODE BLDE 4.0 EZ CLN MEGD (MISCELLANEOUS) ×1 IMPLANT
ELECTRODE REM PT RTRN 9FT ADLT (ELECTROSURGICAL) ×1 IMPLANT
GAUZE SPONGE 4X4 12PLY STRL (GAUZE/BANDAGES/DRESSINGS) ×3 IMPLANT
GAUZE SPONGE 4X4 12PLY STRL LF (GAUZE/BANDAGES/DRESSINGS) ×2 IMPLANT
GLENOID ANCHOR PEG CROSSLK 48 (Orthopedic Implant) ×2 IMPLANT
GLOVE BIOGEL PI ORTHO PRO 7.5 (GLOVE) ×2
GLOVE BIOGEL PI ORTHO PRO SZ8 (GLOVE) ×2
GLOVE ORTHO TXT STRL SZ7.5 (GLOVE) ×3 IMPLANT
GLOVE PI ORTHO PRO STRL 7.5 (GLOVE) ×1 IMPLANT
GLOVE PI ORTHO PRO STRL SZ8 (GLOVE) ×1 IMPLANT
GLOVE SURG ORTHO 8.5 STRL (GLOVE) ×3 IMPLANT
GOWN STRL REUS W/ TWL XL LVL3 (GOWN DISPOSABLE) ×2 IMPLANT
GOWN STRL REUS W/TWL XL LVL3 (GOWN DISPOSABLE) ×6
HEAD HUMERAL ECC SZ 48X21 (Head) ×2 IMPLANT
KIT BASIN OR (CUSTOM PROCEDURE TRAY) ×3 IMPLANT
KIT TURNOVER KIT B (KITS) ×3 IMPLANT
MANIFOLD NEPTUNE II (INSTRUMENTS) ×3 IMPLANT
NDL 1/2 CIR MAYO (NEEDLE) ×1 IMPLANT
NDL HYPO 25GX1X1/2 BEV (NEEDLE) ×1 IMPLANT
NDL SUT 6 .5 CRC .975X.05 MAYO (NEEDLE) ×1 IMPLANT
NEEDLE 1/2 CIR MAYO (NEEDLE) ×3 IMPLANT
NEEDLE HYPO 25GX1X1/2 BEV (NEEDLE) ×3 IMPLANT
NEEDLE MAYO TAPER (NEEDLE) ×3
NS IRRIG 1000ML POUR BTL (IV SOLUTION) ×3 IMPLANT
PACK SHOULDER (CUSTOM PROCEDURE TRAY) ×3 IMPLANT
PAD ABD 8X10 STRL (GAUZE/BANDAGES/DRESSINGS) ×2 IMPLANT
PAD ARMBOARD 7.5X6 YLW CONV (MISCELLANEOUS) ×6 IMPLANT
PIN METAGLENE 2.5 (PIN) ×2 IMPLANT
SLING ARM FOAM STRAP LRG (SOFTGOODS) IMPLANT
SLING ARM IMMOBILIZER LRG (SOFTGOODS) ×2 IMPLANT
SMARTMIX MINI TOWER (MISCELLANEOUS) ×3
SPONGE LAP 18X18 X RAY DECT (DISPOSABLE) ×3 IMPLANT
SPONGE LAP 4X18 RFD (DISPOSABLE) ×3 IMPLANT
SPONGE SURGIFOAM ABS GEL SZ50 (HEMOSTASIS) IMPLANT
STEM UNITE SZ12 (Stem) ×2 IMPLANT
STRIP CLOSURE SKIN 1/2X4 (GAUZE/BANDAGES/DRESSINGS) ×2 IMPLANT
SUCTION FRAZIER HANDLE 10FR (MISCELLANEOUS) ×2
SUCTION TUBE FRAZIER 10FR DISP (MISCELLANEOUS) ×1 IMPLANT
SUT FIBERWIRE #2 38 T-5 BLUE (SUTURE) ×6
SUT MNCRL AB 4-0 PS2 18 (SUTURE) ×3 IMPLANT
SUT VIC AB 0 CT1 27 (SUTURE) ×3
SUT VIC AB 0 CT1 27XBRD ANBCTR (SUTURE) ×1 IMPLANT
SUT VIC AB 0 CT2 27 (SUTURE) ×3 IMPLANT
SUT VIC AB 2-0 CT1 27 (SUTURE) ×3
SUT VIC AB 2-0 CT1 TAPERPNT 27 (SUTURE) ×1 IMPLANT
SUT VICRYL AB 2 0 TIES (SUTURE) IMPLANT
SUTURE FIBERWR #2 38 T-5 BLUE (SUTURE) ×2 IMPLANT
SYR CONTROL 10ML LL (SYRINGE) ×3 IMPLANT
TOWEL OR 17X24 6PK STRL BLUE (TOWEL DISPOSABLE) ×3 IMPLANT
TOWEL OR 17X26 10 PK STRL BLUE (TOWEL DISPOSABLE) ×3 IMPLANT
TOWER SMARTMIX MINI (MISCELLANEOUS) ×1 IMPLANT
YANKAUER SUCT BULB TIP NO VENT (SUCTIONS) IMPLANT

## 2018-07-15 NOTE — Anesthesia Postprocedure Evaluation (Signed)
Anesthesia Post Note  Patient: [name]  Procedure(s) Performed: LEFT SHOULDER ANATOMIC TOTAL SHOULDER ARTHROPLASTY (Left )     Patient location during evaluation: PACU Anesthesia Type: General Level of consciousness: awake and alert and oriented Pain management: pain level controlled Vital Signs Assessment: post-procedure vital signs reviewed and stable Respiratory status: spontaneous breathing, nonlabored ventilation, respiratory function stable, patient connected to nasal cannula oxygen and patient connected to face mask oxygen Cardiovascular status: blood pressure returned to baseline and stable Postop Assessment: no apparent nausea or vomiting Anesthetic complications: no    Last Vitals:  Vitals:   07/15/18 1615 07/15/18 1630  BP:    Pulse: 91 88  Resp: (!) 22 18  Temp:    SpO2: 97% 99%    Last Pain:  Vitals:   07/15/18 1630  PainSc: 0-No pain                 Terran Hollenkamp A.

## 2018-07-15 NOTE — Anesthesia Preprocedure Evaluation (Addendum)
Anesthesia Evaluation  Patient identified by MRN, date of birth, ID band  Reviewed: Allergy & Precautions, NPO status , Patient's Chart, lab work & pertinent test results  History of Anesthesia Complications (+) PONVNegative for: history of anesthetic complications  Airway Mallampati: III  TM Distance: >3 FB Neck ROM: Full    Dental no notable dental hx.    Pulmonary sleep apnea and Continuous Positive Airway Pressure Ventilation , former smoker,    Pulmonary exam normal        Cardiovascular hypertension, Pt. on medications + DVT  Normal cardiovascular exam     Neuro/Psych negative neurological ROS  negative psych ROS   GI/Hepatic GERD  ,(+)     substance abuse  , Ulcerative colitis   Endo/Other  negative endocrine ROS  Renal/GU negative Renal ROS  negative genitourinary   Musculoskeletal  (+) Arthritis , narcotic dependent  Abdominal (+) + obese,   Peds  Hematology negative hematology ROS (+)   Anesthesia Other Findings 67 yo M for L TSA - PONV, OSA on CPAP, HTN, h/o DVT, chronic pain on fentanyl patch, ulcerative colitis  Reproductive/Obstetrics                            Anesthesia Physical Anesthesia Plan  ASA: III  Anesthesia Plan: General   Post-op Pain Management: GA combined w/ Regional for post-op pain   Induction: Intravenous  PONV Risk Score and Plan: 3 and Ondansetron, Dexamethasone, Midazolam and Treatment may vary due to age or medical condition  Airway Management Planned: Oral ETT  Additional Equipment: None  Intra-op Plan:   Post-operative Plan: Extubation in OR  Informed Consent: I have reviewed the patients History and Physical, chart, labs and discussed the procedure including the risks, benefits and alternatives for the proposed anesthesia with the patient or authorized representative who has indicated his/her understanding and acceptance.   Dental  advisory given  Plan Discussed with:   Anesthesia Plan Comments:        Anesthesia Quick Evaluation

## 2018-07-15 NOTE — Op Note (Signed)
NAME: [name], [name] MEDICAL RECORD BF:[id number] ACCOUNT [id number] DATE OF BIRTH:11/29/1950 FACILITY: [facility] LOCATION: [facility] PHYSICIAN:STEVEN Orlena Sheldon, MD  OPERATIVE REPORT  DATE OF PROCEDURE:  07/15/2018  PREOPERATIVE DIAGNOSIS:  Left shoulder end-stage osteoarthritis.  POSTOPERATIVE DIAGNOSIS:  Left shoulder end-stage osteoarthritis.  PROCEDURE PERFORMED:  Left anatomic total shoulder arthroplasty using DePuy Global Unite system.  ATTENDING SURGEON:  Esmond Plants, MD  ASSISTANT:  Darol Destine, [place], who was scrubbed during the entire procedure and necessary for satisfactory completion of surgery.  ANESTHESIA:  General anesthesia was used plus interscalene block.  ESTIMATED BLOOD LOSS:  150 mL.  FLUID REPLACEMENT:  1500 mL crystalloid.  INSTRUMENT COUNTS:  Correct.  COMPLICATIONS:  No complications.  ANTIBIOTICS:  Perioperative antibiotics were given.  INDICATIONS:  The patient is a 67 year old male with worsening left shoulder pain and dysfunction secondary to end-stage osteoarthritis.  The patient has had prior right anatomic total shoulder replacement and done well with that.  He presents now for  his left shoulder, which continues to bother him despite conservative management.  The patient presents with severe pain including rest pain and night pain and would like to move forward with shoulder replacement.  Informed consent obtained.  DESCRIPTION OF PROCEDURE:  After an adequate level of anesthesia was achieved, the patient was positioned in modified beach chair position.  Left shoulder correctly identified and sterilely prepped and draped in the usual manner.  Time-out called,  verifying correct patient, correct site.  We entered the shoulder using standard deltopectoral approach starting at the coracoid process extending down to the anterior humerus.  Dissection down through subcutaneous tissues using the Bovie.  We identified  cephalic vein, took it laterally  with the deltoid, pectoralis taken medially.  Conjoined tendon identified and retracted medially.  Deep retractors were placed.  We tenodesed the biceps in situ with 0 Vicryl figure-of-eight suture x2 incorporating that  pec tendon insertion for soft tissue tenodesis.  We then released the subscapularis subperiosteally off the lesser tuberosity and placed #2 FiberWire suture in a modified Mason-Allen suture technique x3 in the free edge of the tendon for repair at the  end.  We then released the inferior capsule progressively externally rotating the humerus.  We then placed a T-handled Crego elevator over the top of the humeral articular cartilage of the humeral head.  There was a full-thickness cartilage loss noted in  the glenohumeral joint as we exposed the joint.  Once we had the T-handled Crego over the top protecting the rotator cuff, we placed a Crego elevator and reverse Hohmann medially to protect the medial soft tissues.  We then placed the elbow at the  patient's side and externally rotated the shoulder approximately 30 degrees and performed anterior, posterior 30 degree cut with the oscillating saw so we cut the shoulder in 30 degrees of retroversion.  Once we had that head cut, we removed excess  osteophytes with a rongeur.  We then subluxed the humerus posteriorly and then did our glenoid exposure.  We did a 360 degree capsular labral excision.  We were careful to protect the axillary nerve.  We found the socket completely devoid of cartilage.   We found our center point for our reaming pin and we placed a guide pin using the 48 mm guide.  Once we had that tendon proper position, we reamed with the low profile 48 reamer and then did our peripheral hand reaming.  We then drilled our central peg  hole and our three peripheral  holes using the guide referencing off the 6 o'clock position in the inferior scapular neck.  Once we had that done, we placed our 48 standard APG glenoid trial into position  and impacted that.  It seated perfectly.  We then  removed the trial and irrigated thoroughly.  We then placed Gelfoam soaked in thrombin in the three peripheral holes to dry those holes in preparation for cementing.  We then used a vacuum mixing cement and then cemented the three peripheral holes with a  small cement gun.  Once we had the cement in position we impacted the 48 APG glenoid into position.  It seated well.  We held until the cement hardened.  We then went ahead and irrigated and inspected for any loose cement which there was none.  At this  point, we brought our humerus back out of the wound and completed our humeral preparation with the punch first after we reamed up to a size 12.  We then went ahead and used our size 10 and then a 12 broach and then impacted the trial size 12 metaphysis  into position in 30 degrees retroversion.  We used the alignment rod to verify that.  We then selected a 48, initially 18, then 21 eccentric humeral head component to give [place] the best fit and best coverage on the bone cut.  We reduced the shoulder, had  nice soft tissue balancing.  It felt like the 21 was perfect.  We retrieved all the trial components, placed drill holes in lesser tuberosity and placed #2 FiberWire suture for repair of the subscapularis.  We used available bone graft from the head and  used an impaction grafting technique to impact the HA coated Porocoat stem into position.  So this was a size 12 body 12 metaphysis with the Global Unite system by DePuy and impacted that in 30 degrees of retroversion.  Once it was fully seated, we  selected our 48/21 eccentric and then dialed that eccentricity posterior superiorly to give the best coverage and fit.  We impacted that in position.  We reduced the shoulder again.  We were very happy with soft tissue balancing.  We irrigated and ranged  the shoulder well to make sure there was no impinging lesions, a finger sweep to make sure there were no  additional loose bodies and then we went ahead and repaired the subscapularis anatomically back to bone with sutures through bone and also with  rotator interval repair.  Once everything was repaired, we ranged the shoulder again.  We had about 30 degrees of external rotation with no gapping whatsoever and full forward elevation with no significant tension on the repair site.  We also noted there  to be excellent balance to that humeral head component within the glenoid once I pushed that posteriorly, it would go ahead and pop right back in place there.  So it definitely wanted to be reduced.  At this point, we were happy with our repair.  We  closed the deltopectoral interval with 0 Vicryl suture followed by 2-0 Vicryl for subcutaneous closure and 4-0 Monocryl for skin.  Steri-Strips applied followed by sterile dressing.  The patient tolerated surgery well.  TN/NUANCE  D:07/15/2018 T:07/15/2018 JOB:[id number]

## 2018-07-15 NOTE — Discharge Instructions (Signed)
Ice to the shoulder constantly.  Keep the incision covered and clean and dry for one week, then ok to get it wet in the shower.  Do exercise as instructed several times per day.  DO NOT reach behind your back or push up out of a chair with the operative arm.  Use a sling while you are up and around for comfort, may remove while seated.  Keep pillow propped behind the operative elbow.  Follow up with Dr Veverly Fells in two weeks in the office, call [phone] for appt

## 2018-07-15 NOTE — Interval H&P Note (Signed)
History and Physical Interval Note:  07/15/2018 12:33 PM  [name]  has presented today for surgery, with the diagnosis of Left shoulder end stage osteoarthritis  The various methods of treatment have been discussed with the patient and family. After consideration of risks, benefits and other options for treatment, the patient has consented to  Procedure(s): LEFT SHOULDER ANATOMIC TOTAL SHOULDER ARTHROPLASTY (Left) as a surgical intervention .  The patient's history has been reviewed, patient examined, no change in status, stable for surgery.  I have reviewed the patient's chart and labs.  Questions were answered to the patient's satisfaction.     Isyss Espinal,STEVEN R

## 2018-07-15 NOTE — Anesthesia Procedure Notes (Signed)
Procedure Name: Intubation Date/Time: 07/15/2018 12:53 PM Performed by: Myna Bright, CRNA Pre-anesthesia Checklist: Patient identified, Emergency Drugs available, Suction available and Patient being monitored Patient Re-evaluated:Patient Re-evaluated prior to induction Oxygen Delivery Method: Circle system utilized Preoxygenation: Pre-oxygenation with 100% oxygen Induction Type: IV induction Ventilation: Mask ventilation without difficulty Laryngoscope Size: Mac and 4 Grade View: Grade II Tube type: Oral Tube size: 7.5 mm Number of attempts: 1 Airway Equipment and Method: Stylet Placement Confirmation: ETT inserted through vocal cords under direct vision,  positive ETCO2 and breath sounds checked- equal and bilateral Secured at: 22 cm Tube secured with: Tape Dental Injury: Teeth and Oropharynx as per pre-operative assessment

## 2018-07-15 NOTE — Transfer of Care (Signed)
Immediate Anesthesia Transfer of Care Note  Patient: [name]  Procedure(s) Performed: LEFT SHOULDER ANATOMIC TOTAL SHOULDER ARTHROPLASTY (Left )  Patient Location: PACU  Anesthesia Type:GA combined with regional for post-op pain  Level of Consciousness: drowsy  Airway & Oxygen Therapy: Patient Spontanous Breathing and Patient connected to face mask oxygen  Post-op Assessment: Report given to RN and Post -op Vital signs reviewed and stable  Post vital signs: Reviewed and stable  Last Vitals:  Vitals Value Taken Time  BP 104/71 07/15/2018  3:55 PM  Temp    Pulse 100 07/15/2018  3:59 PM  Resp 22 07/15/2018  3:59 PM  SpO2 88 % 07/15/2018  3:59 PM  Vitals shown include unvalidated device data.  Last Pain:  Vitals:   07/15/18 1130  PainSc: 5       Patients Stated Pain Goal: 5 (30/86/57 8469)  Complications: No apparent anesthesia complications

## 2018-07-15 NOTE — Brief Op Note (Signed)
07/15/2018  3:49 PM  PATIENT:  [name]  67 y.o. male  PRE-OPERATIVE DIAGNOSIS:  Left shoulder end stage osteoarthritis  POST-OPERATIVE DIAGNOSIS:  Left shoulder end stage osteoarthritis  PROCEDURE:  Procedure(s): LEFT SHOULDER ANATOMIC TOTAL SHOULDER ARTHROPLASTY (Left) [employer] with APG glenoid  SURGEON:  Surgeon(s) and Role:    [name], MD - Primary  PHYSICIAN ASSISTANT:   ASSISTANTS: Ventura Bruns, PA-C  ANESTHESIA:   regional and general  EBL:  100 mL   BLOOD ADMINISTERED:none  DRAINS: none   LOCAL MEDICATIONS USED:  MARCAINE     SPECIMEN:  No Specimen  DISPOSITION OF SPECIMEN:  N/A  COUNTS:  YES  TOURNIQUET:  * No tourniquets in log *  DICTATION: .Other Dictation: Dictation Number [id number]  PLAN OF CARE: Admit to inpatient   PATIENT DISPOSITION:  PACU - hemodynamically stable.   Delay start of Pharmacological VTE agent (>24hrs) due to surgical blood loss or risk of bleeding: not applicable

## 2018-07-16 LAB — BASIC METABOLIC PANEL
Anion gap: 11 (ref 5–15)
BUN: 16 mg/dL (ref 8–23)
CO2: 22 mmol/L (ref 22–32)
CREATININE: 1.17 mg/dL (ref 0.61–1.24)
Calcium: 8.6 mg/dL — ABNORMAL LOW (ref 8.9–10.3)
Chloride: 105 mmol/L (ref 98–111)
GFR calc Af Amer: >60 mL/min (ref >60)
GFR calc non Af Amer: >60 mL/min (ref >60)
Glucose, Bld: 137 mg/dL — ABNORMAL HIGH (ref 70–99)
Potassium: 4.6 mmol/L (ref 3.5–5.1)
Sodium: 138 mmol/L (ref 135–145)

## 2018-07-16 LAB — HEMOGLOBIN AND HEMATOCRIT, BLOOD
HEMATOCRIT: 38.9 % — AB (ref 39.0–52.0)
Hemoglobin: 12.7 g/dL — ABNORMAL LOW (ref 13.0–17.0)

## 2018-07-16 NOTE — Discharge Summary (Signed)
Orthopedic Discharge Summary        Physician Discharge Summary  Patient ID: [name] MRN: [id number] DOB/AGE: [birth year] 67 y.o.  Admit date: 07/15/2018 Discharge date: 07/16/2018   Procedures:  Procedure(s) (LRB): LEFT SHOULDER ANATOMIC TOTAL SHOULDER ARTHROPLASTY (Left)  Attending Physician:  Dr. Esmond Plants  Admission Diagnoses:   Left shoulder end staged OA  Discharge Diagnoses:  Left shoulder end staged OA   Past Medical History:  Diagnosis Date  . Arthritis    oa and ddd  . Back pain, chronic    pt states he has 3 herniated disks--uses fentanyl patch for pain  . Blood transfusion without reported diagnosis 1950   rH negative"at birth only"  . BPH (benign prostatic hyperplasia)    frequent urination, not able to hold urine well  . 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 Cataracts, both eyes    worst on left  . Colon polyps   . Complication of anesthesia    pt had spinal for a knee replacement--"woke up" during surgery--and sick after surgery  . Depression   . Diverticulosis   . DVT (deep venous thrombosis) ([place]) 2001 or 2002   right leg-required hospitalization x 8 days  . DVT (deep venous thrombosis) ([place]) 1996   right  . Glaucoma    both eyes  . H/O hiatal hernia   . Hearing impaired person, bilateral    bilateral  . Hemorrhoid   . History of IBS   . History of kidney stones    x 1-passed  . Hyperlipidemia 2012  . Hypertension   . Lumbago   . Lumbar post-laminectomy syndrome   . PONV (postoperative nausea and vomiting)   . Sleep apnea    pt uses cpap - setting of 16  . Ulcer 1966  . Ulcerative colitis ([place])   . Wears glasses   . Wears hearing aid     PCP: [name], MD   Discharged Condition: good  Hospital Course:  Patient underwent the above stated procedure on 07/15/2018. Patient tolerated the procedure well and brought to the recovery room in good condition and subsequently to the floor. Patient had  an uncomplicated hospital course and was stable for discharge.   Disposition: Discharge disposition: 01-Home or Self Care      with follow up in 2 weeks   Follow-up Information    [name], MD In 2 weeks.   Specialty:  Orthopedic Surgery Why:  [phone] Contact information: [address] [place] [address] [phone]           Discharge Instructions    Call MD / Call 911   Complete by:  As directed    If you experience chest pain or shortness of breath, CALL 911 and be transported to the hospital emergency room.  If you develope a fever above 101 F, pus (white drainage) or increased drainage or redness at the wound, or calf pain, call your surgeon's office.   Constipation Prevention   Complete by:  As directed    Drink plenty of fluids.  Prune juice may be helpful.  You may use a stool softener, such as Colace (over the counter) 100 mg twice a day.  Use MiraLax (over the counter) for constipation as needed.   Diet - low sodium heart healthy   Complete by:  As directed    Driving restrictions   Complete by:  As directed    No driving  for 2 weeks   Increase activity slowly as tolerated   Complete by:  As directed       Allergies as of 07/16/2018      Reactions   Adhesive [tape] Other (See Comments)   UNSPECIFIED REACTION    Other Other (See Comments)   "Adhesives"Pt allergic to some tapes--pt request that we only use paper tape!!      Medication List    TAKE these medications   acetaminophen 500 MG tablet Commonly known as:  TYLENOL Take 1,000 mg by mouth every 6 (six) hours as needed.   albuterol 108 (90 Base) MCG/ACT inhaler Commonly known as:  PROVENTIL HFA;VENTOLIN HFA Inhale 2 puffs into the lungs every 4 (four) hours as needed for wheezing or shortness of breath.   aspirin 81 MG tablet Take 81 mg by mouth at bedtime.   atorvastatin 40 MG tablet Commonly known as:  LIPITOR TAKE 1 TABLET BY MOUTH ONCE DAILY   CALCIUM +  D PO Take 1 tablet by mouth at bedtime.   cetirizine 10 MG tablet Commonly known as:  ZYRTEC Take 10 mg by mouth at bedtime.   fentaNYL 25 MCG/HR patch Commonly known as:  [place] - dosed mcg/hr Place 1 patch onto the skin every 3 (three) days.   Fish Oil 500 MG Caps Take 500 mg by mouth at bedtime.   fluticasone 50 MCG/ACT nasal spray Commonly known as:  FLONASE Place 1 spray into both nostrils daily as needed for allergies.   lisinopril 20 MG tablet Commonly known as:  PRINIVIL,ZESTRIL TAKE 1 TABLET BY MOUTH ONCE DAILY What changed:    how much to take  how to take this  when to take this   magnesium oxide 400 MG tablet Commonly known as:  MAG-OX Take 400 mg by mouth at bedtime.   methocarbamol 500 MG tablet Commonly known as:  ROBAXIN Take 1 tablet (500 mg total) by mouth 3 (three) times daily as needed.   multivitamin with minerals Tabs tablet Take 1 tablet by mouth at bedtime.   oxyCODONE-acetaminophen 5-325 MG tablet Commonly known as:  PERCOCET Take 1-2 tablets by mouth every 4 (four) hours as needed for moderate pain or severe pain.   PAZEO 0.7 % Soln Generic drug:  Olopatadine HCl Place 1 drop into both eyes daily.   traZODone 100 MG tablet Commonly known as:  DESYREL Take 100 mg by mouth at bedtime.   vortioxetine HBr 10 MG Tabs tablet Commonly known as:  TRINTELLIX Take 1 tablet (10 mg total) by mouth daily. What changed:  when to take this   XELPROS 0.005 % Emul Generic drug:  Latanoprost Place 1 drop into both eyes at bedtime.         Signed: Augustin Schooling 07/16/2018, 8:02 AM  [facility] is now [facility] [address] [place], [place], [place] [address] Phone: [place]

## 2018-07-16 NOTE — Progress Notes (Signed)
Orthopedics Progress Note  Subjective: PAtient reports feeling good this AM. Hoping for discharge today  Objective:  Vitals:   07/16/18 0312 07/16/18 0626  BP: (!) 88/56 (!) 100/58  Pulse: 62 63  Resp: 13 10  Temp:    SpO2: 97% 98%    General: Awake and alert  Musculoskeletal: left shoulder incision clean and dry and intact, dressing changed Neurovascularly intact  Lab Results  Component Value Date   WBC 5.9 07/07/2018   HGB 12.7 (L) 07/16/2018   HCT 38.9 (L) 07/16/2018   MCV 93.1 07/07/2018   PLT 189 07/07/2018       Component Value Date/Time   NA 138 07/16/2018 0318   K 4.6 07/16/2018 0318   CL 105 07/16/2018 0318   CO2 22 07/16/2018 0318   GLUCOSE 137 (H) 07/16/2018 0318   BUN 16 07/16/2018 0318   CREATININE 1.17 07/16/2018 0318   CREATININE 1.13 04/07/2018 0815   CALCIUM 8.6 (L) 07/16/2018 0318   GFRNONAA >60 07/16/2018 0318   GFRNONAA 83 04/06/2017 0825   GFRAA >60 07/16/2018 0318   GFRAA >89 04/06/2017 0825    Lab Results  Component Value Date   INR 0.98 11/09/2013    Assessment/Plan: POD #1 s/p Procedure(s): LEFT SHOULDER ANATOMIC TOTAL SHOULDER ARTHROPLASTY Discharge to home after therapy. Follow up in the office in two weeks  Doran Heater. Veverly Fells, MD 07/16/2018 7:59 AM

## 2018-07-16 NOTE — Progress Notes (Signed)
[name] to be D/C'd home per MD order. Discussed with the patient and wife and all questions fully answered. VVS, Skin clean, dry and intact without evidence of skin break down, no evidence of skin tears noted. Bipap removed, oxygen sats remained >93% on room air. IV catheter discontinued intact. Site without signs and symptoms of complications. Dressing and pressure applied.  An After Visit Summary was printed and given to the patient.  Patient escorted via [place], and D/C home via private auto.  Melonie [place]  07/16/2018 11:04 AM

## 2018-07-16 NOTE — Evaluation (Signed)
Occupational Therapy Evaluation Patient Details Name: [name] MRN: [id number] DOB: 1951-03-05 Today's Date: 07/16/2018    History of Present Illness Pt is a 67 y.o. male s/p L total shoulder arthroplasty after complaining of left shoulder pain with end stage arthritic changes and failed conservative tx. PMH: arthritis, chronic back pain, HTN, back sx, R shoulder sx in june 2019.    Clinical Impression   PTA patient independent.  Admitted for above and limited by decreased functional use of L surgical UE and pain.  Educated on sling mgmt and wear schedule, precautions, exercises, ADl compensatory techniques, recommendations, and safety. Patient and spouse verbalize understanding of education. Pt requires mod assist for UB/LB ADLs, min assist for bed mobility, supervision for transfers and mobility.  He will have spouses assist at dc and she has demonstrated donning sling with independence, requires assist with exercises of hand/wrist/elbow due to nerve block and spouse can assist initally. Patient and spouse have no further questions.  At this time, no further OT needs have been identified and pt can follow up per surgeons recommendations.     Follow Up Recommendations  No OT follow up;Follow surgeon's recommendation for DC plan and follow-up therapies;Supervision/Assistance - 24 hour    Equipment Recommendations  None recommended by OT    Recommendations for Other Services       Precautions / Restrictions Precautions Precautions: Shoulder Type of Shoulder Precautions: active protocol for ADLs: AROM H/W/E, AROM/PROM shoulder FF 90, abduction 60, ER 30  Shoulder Interventions: For comfort;Shoulder sling/immobilizer(sleeping ) Precaution Booklet Issued: Yes (comment) Precaution Comments: reviewed precautions with patinet and spouse  Required Braces or Orthoses: Sling Restrictions Weight Bearing Restrictions: Yes LUE Weight Bearing: Non weight bearing      Mobility Bed  Mobility Overal bed mobility: Needs Assistance Bed Mobility: Supine to Sit     Supine to sit: Min assist     General bed mobility comments: min assist for trunk support ascending to L side  Transfers Overall transfer level: Needs assistance Equipment used: None Transfers: Sit to/from Stand Sit to Stand: Supervision         General transfer comment: supervision for safety    Balance Overall balance assessment: Mild deficits observed, not formally tested                                         ADL either performed or assessed with clinical judgement   ADL Overall ADL's : Needs assistance/impaired Eating/Feeding: Set up;Sitting   Grooming: Set up;Sitting   Upper Body Bathing: Minimal assistance;Sitting   Lower Body Bathing: Moderate assistance;Sit to/from stand   Upper Body Dressing : Moderate assistance;Sitting   Lower Body Dressing: Moderate assistance;Sit to/from stand   Toilet Transfer: Supervision/safety;Ambulation Toilet Transfer Details (indicate cue type and reason): simulated in room Toileting- Clothing Manipulation and Hygiene: Supervision/safety;Sit to/from stand       Functional mobility during ADLs: Supervision/safety General ADL Comments: Patient requires some assist with ADLs due to decreased functional use of L UE, spouse present and able to assist.  Reviewed precautions and safety.     Vision Baseline Vision/History: Wears glasses Wears Glasses: At all times Patient Visual Report: No change from baseline Vision Assessment?: No apparent visual deficits     Perception     Praxis      Pertinent Vitals/Pain Pain Assessment: Faces Faces Pain Scale: Hurts a little bit Pain Location: L  UE  Pain Descriptors / Indicators: Discomfort;Operative site guarding Pain Intervention(s): Limited activity within patient's tolerance;Monitored during session     Hand Dominance Right   Extremity/Trunk Assessment Upper Extremity  Assessment Upper Extremity Assessment: LUE deficits/detail LUE Deficits / Details: s/p shoulder surgery, nerve block intact and limited AROM but PROM WFL hand, wrist elbow  LUE Sensation: (nerve block intact ) LUE Coordination: decreased fine motor;decreased gross motor   Lower Extremity Assessment Lower Extremity Assessment: Overall WFL for tasks assessed       Communication Communication Communication: HOH   Cognition Arousal/Alertness: Awake/alert Behavior During Therapy: WFL for tasks assessed/performed Overall Cognitive Status: Within Functional Limits for tasks assessed                                     General Comments  spouse present and supportive    Exercises Exercises: Shoulder Shoulder Exercises Elbow Flexion: PROM;Left;5 reps;Seated Elbow Extension: PROM;Left;5 reps;Seated Wrist Flexion: PROM;Left;5 reps;Seated Wrist Extension: PROM;Left;5 reps;Seated Digit Composite Flexion: PROM;Left;5 reps;Seated Composite Extension: PROM;Left;5 reps;Seated   Shoulder Instructions Shoulder Instructions Donning/doffing shirt without moving shoulder: Minimal assistance;Caregiver independent with task Method for sponge bathing under operated UE: Minimal assistance;Caregiver independent with task Donning/doffing sling/immobilizer: Caregiver independent with task Correct positioning of sling/immobilizer: Caregiver independent with task ROM for elbow, wrist and digits of operated UE: Maximal assistance;Caregiver independent with task Sling wearing schedule (on at all times/off for ADL's): Supervision/safety Proper positioning of operated UE when showering: Supervision/safety Positioning of UE while sleeping: Supervision/safety;Caregiver independent with task    Home Living Family/patient expects to be discharged to:: Private residence Living Arrangements: Spouse/significant other Available Help at Discharge: Family;Available 24 hours/day Type of Home:  House Home Access: Stairs to enter [employer] of Steps: 3 Entrance Stairs-Rails: Can reach both;Right;Left Home Layout: One level     Bathroom Shower/Tub: Occupational psychologist: Handicapped height     Home Equipment: Environmental consultant - 2 wheels;Walker - 4 wheels;Crutches;Bedside commode          Prior Functioning/Environment Level of Independence: Independent                 OT Problem List: Decreased range of motion;Decreased strength;Decreased activity tolerance;Impaired balance (sitting and/or standing);Decreased knowledge of use of DME or AE;Decreased knowledge of precautions;Impaired UE functional use;Pain;Decreased coordination      OT Treatment/Interventions:      OT Goals(Current goals can be found in the care plan section) Acute Rehab OT Goals Patient Stated Goal: home today OT Goal Formulation: With patient  OT Frequency:     Barriers to D/C:            Co-evaluation              AM-PAC OT "6 Clicks" Daily Activity     Outcome Measure Help from another person eating meals?: None Help from another person taking care of personal grooming?: None Help from another person toileting, which includes using toliet, bedpan, or urinal?: A Little Help from another person bathing (including washing, rinsing, drying)?: A Lot Help from another person to put on and taking off regular upper body clothing?: A Lot Help from another person to put on and taking off regular lower body clothing?: A Lot 6 Click Score: 17   End of Session Equipment Utilized During Treatment: Other (comment)(sling) Nurse Communication: Mobility status  Activity Tolerance: Patient tolerated treatment well Patient left: with call bell/phone within reach;with  family/visitor present;Other (comment)(seated EOB )  OT Visit Diagnosis: Pain Pain - Right/Left: Left Pain - part of body: Shoulder                Time: 8937-3749 OT Time Calculation (min): 22 min Charges:  OT  General Charges $OT Visit: 1 Visit OT Evaluation $OT Eval Moderate Complexity: [place], OT Acute Rehabilitation Services Pager [phone] Office [phone]   Delight Stare 07/16/2018, 10:01 AM

## 2018-07-18 ENCOUNTER — Observation Stay (HOSPITAL_COMMUNITY): Payer: Medicare Other

## 2018-07-18 ENCOUNTER — Other Ambulatory Visit: Payer: Self-pay

## 2018-07-18 ENCOUNTER — Encounter (HOSPITAL_COMMUNITY): Payer: Self-pay | Admitting: Orthopedic Surgery

## 2018-07-18 ENCOUNTER — Observation Stay (HOSPITAL_COMMUNITY)
Admission: EM | Admit: 2018-07-18 | Discharge: 2018-07-20 | Disposition: A | Discharge status: Home / Self Care | Payer: Medicare Other | Attending: Internal Medicine | Admitting: Internal Medicine

## 2018-07-18 DIAGNOSIS — T7840XA Allergy, unspecified, initial encounter: Principal | ICD-10-CM | POA: Insufficient documentation

## 2018-07-18 DIAGNOSIS — N492 Inflammatory disorders of scrotum: Secondary | ICD-10-CM | POA: Diagnosis not present

## 2018-07-18 DIAGNOSIS — N4822 Cellulitis of corpus cavernosum and penis: Secondary | ICD-10-CM | POA: Diagnosis not present

## 2018-07-18 DIAGNOSIS — Z79899 Other long term (current) drug therapy: Secondary | ICD-10-CM | POA: Diagnosis not present

## 2018-07-18 DIAGNOSIS — X58XXXA Exposure to other specified factors, initial encounter: Secondary | ICD-10-CM | POA: Diagnosis not present

## 2018-07-18 DIAGNOSIS — Z9989 Dependence on other enabling machines and devices: Secondary | ICD-10-CM | POA: Diagnosis not present

## 2018-07-18 DIAGNOSIS — Z96653 Presence of artificial knee joint, bilateral: Secondary | ICD-10-CM | POA: Diagnosis not present

## 2018-07-18 DIAGNOSIS — N179 Acute kidney failure, unspecified: Secondary | ICD-10-CM | POA: Insufficient documentation

## 2018-07-18 DIAGNOSIS — Z8249 Family history of ischemic heart disease and other diseases of the circulatory system: Secondary | ICD-10-CM | POA: Diagnosis not present

## 2018-07-18 DIAGNOSIS — Z86718 Personal history of other venous thrombosis and embolism: Secondary | ICD-10-CM | POA: Insufficient documentation

## 2018-07-18 DIAGNOSIS — I10 Essential (primary) hypertension: Secondary | ICD-10-CM | POA: Diagnosis not present

## 2018-07-18 DIAGNOSIS — G4733 Obstructive sleep apnea (adult) (pediatric): Secondary | ICD-10-CM | POA: Diagnosis not present

## 2018-07-18 DIAGNOSIS — I4891 Unspecified atrial fibrillation: Secondary | ICD-10-CM | POA: Diagnosis not present

## 2018-07-18 DIAGNOSIS — K219 Gastro-esophageal reflux disease without esophagitis: Secondary | ICD-10-CM | POA: Diagnosis not present

## 2018-07-18 DIAGNOSIS — F329 Major depressive disorder, single episode, unspecified: Secondary | ICD-10-CM | POA: Diagnosis not present

## 2018-07-18 DIAGNOSIS — Z87891 Personal history of nicotine dependence: Secondary | ICD-10-CM | POA: Diagnosis not present

## 2018-07-18 DIAGNOSIS — R05 Cough: Secondary | ICD-10-CM | POA: Diagnosis not present

## 2018-07-18 DIAGNOSIS — L0390 Cellulitis, unspecified: Secondary | ICD-10-CM | POA: Diagnosis present

## 2018-07-18 DIAGNOSIS — Z96612 Presence of left artificial shoulder joint: Secondary | ICD-10-CM | POA: Insufficient documentation

## 2018-07-18 DIAGNOSIS — R1013 Epigastric pain: Secondary | ICD-10-CM | POA: Diagnosis present

## 2018-07-18 DIAGNOSIS — R1909 Other intra-abdominal and pelvic swelling, mass and lump: Secondary | ICD-10-CM | POA: Diagnosis present

## 2018-07-18 DIAGNOSIS — N4829 Other inflammatory disorders of penis: Secondary | ICD-10-CM

## 2018-07-18 DIAGNOSIS — Z7982 Long term (current) use of aspirin: Secondary | ICD-10-CM | POA: Insufficient documentation

## 2018-07-18 DIAGNOSIS — E785 Hyperlipidemia, unspecified: Secondary | ICD-10-CM | POA: Insufficient documentation

## 2018-07-18 DIAGNOSIS — Z85828 Personal history of other malignant neoplasm of skin: Secondary | ICD-10-CM | POA: Insufficient documentation

## 2018-07-18 DIAGNOSIS — R059 Cough, unspecified: Secondary | ICD-10-CM

## 2018-07-18 DIAGNOSIS — Z96611 Presence of right artificial shoulder joint: Secondary | ICD-10-CM | POA: Insufficient documentation

## 2018-07-18 LAB — URINALYSIS, ROUTINE W REFLEX MICROSCOPIC
Bilirubin Urine: NEGATIVE
Glucose, UA: 50 mg/dL — AB
Hgb urine dipstick: NEGATIVE
Ketones, ur: 20 mg/dL — AB
Leukocytes, UA: NEGATIVE
NITRITE: NEGATIVE
Protein, ur: NEGATIVE mg/dL
Specific Gravity, Urine: 1.015 (ref 1.005–1.030)
pH: 5 (ref 5.0–8.0)

## 2018-07-18 LAB — BASIC METABOLIC PANEL
Anion gap: 11 (ref 5–15)
BUN: 13 mg/dL (ref 8–23)
CO2: 26 mmol/L (ref 22–32)
Calcium: 9.1 mg/dL (ref 8.9–10.3)
Chloride: 103 mmol/L (ref 98–111)
Creatinine, Ser: 1.01 mg/dL (ref 0.61–1.24)
GFR calc Af Amer: >60 mL/min (ref >60)
GFR calc non Af Amer: >60 mL/min (ref >60)
Glucose, Bld: 134 mg/dL — ABNORMAL HIGH (ref 70–99)
Potassium: 3.7 mmol/L (ref 3.5–5.1)
Sodium: 140 mmol/L (ref 135–145)

## 2018-07-18 LAB — I-STAT VENOUS BLOOD GAS, ED
Acid-base deficit: 1 mmol/L (ref 0.0–2.0)
Bicarbonate: 25.7 mmol/L (ref 20.0–28.0)
O2 Saturation: 49 %
TCO2: 27 mmol/L (ref 22–32)
pCO2, Ven: 47.2 mmHg (ref 44.0–60.0)
pH, Ven: 7.343 (ref 7.250–7.430)
pO2, Ven: 28 mmHg — CL (ref 32.0–45.0)

## 2018-07-18 LAB — CBC WITH DIFFERENTIAL/PLATELET
Abs Immature Granulocytes: 0.04 10*3/uL (ref 0.00–0.07)
BASOS ABS: 0 10*3/uL (ref 0.0–0.1)
Basophils Relative: 1 %
Eosinophils Absolute: 0.4 10*3/uL (ref 0.0–0.5)
Eosinophils Relative: 4 %
HCT: 40.3 % (ref 39.0–52.0)
Hemoglobin: 13.7 g/dL (ref 13.0–17.0)
Immature Granulocytes: 1 %
LYMPHS PCT: 18 %
Lymphs Abs: 1.5 10*3/uL (ref 0.7–4.0)
MCH: 31.1 pg (ref 26.0–34.0)
MCHC: 34 g/dL (ref 30.0–36.0)
MCV: 91.6 fL (ref 80.0–100.0)
Monocytes Absolute: 1.4 10*3/uL — ABNORMAL HIGH (ref 0.1–1.0)
Monocytes Relative: 17 %
NEUTROS ABS: 4.9 10*3/uL (ref 1.7–7.7)
Neutrophils Relative %: 59 %
Platelets: 180 10*3/uL (ref 150–400)
RBC: 4.4 MIL/uL (ref 4.22–5.81)
RDW: 12 % (ref 11.5–15.5)
WBC: 8.2 10*3/uL (ref 4.0–10.5)
nRBC: 0 % (ref 0.0–0.2)

## 2018-07-18 LAB — LACTIC ACID, PLASMA: LACTIC ACID, VENOUS: 1.4 mmol/L (ref 0.5–1.9)

## 2018-07-18 LAB — TROPONIN I

## 2018-07-18 MED ORDER — SENNA 8.6 MG PO TABS
1.0000 | ORAL_TABLET | Freq: Two times a day (BID) | ORAL | Status: DC
  Administered 2018-07-19 – 2018-07-20 (×4): 8.6 mg via ORAL
  Filled 2018-07-18 (×4): qty 1

## 2018-07-18 MED ORDER — ACETAMINOPHEN 650 MG RE SUPP: 650.0000 mg | Freq: Four times a day (QID) | RECTAL | Status: DC | PRN

## 2018-07-18 MED ORDER — ONDANSETRON HCL 4 MG/2ML IJ SOLN: 4.0000 mg | Freq: Four times a day (QID) | INTRAMUSCULAR | Status: DC | PRN

## 2018-07-18 MED ORDER — ACETAMINOPHEN 325 MG PO TABS: 650.0000 mg | ORAL_TABLET | Freq: Four times a day (QID) | ORAL | Status: DC | PRN

## 2018-07-18 MED ORDER — OLOPATADINE HCL 0.1 % OP SOLN
1.0000 [drp] | Freq: Every day | OPHTHALMIC | Status: DC
  Administered 2018-07-19: 1 [drp] via OPHTHALMIC
  Filled 2018-07-18: qty 5

## 2018-07-18 MED ORDER — POLYETHYLENE GLYCOL 3350 17 G PO PACK
17.0000 g | PACK | Freq: Every day | ORAL | Status: DC | PRN
  Administered 2018-07-20: 17 g via ORAL
  Filled 2018-07-18: qty 1

## 2018-07-18 MED ORDER — ASPIRIN EC 81 MG PO TBEC
81.0000 mg | DELAYED_RELEASE_TABLET | Freq: Every day | ORAL | Status: DC
  Administered 2018-07-19 (×2): 81 mg via ORAL
  Filled 2018-07-18 (×2): qty 1

## 2018-07-18 MED ORDER — VORTIOXETINE HBR 10 MG PO TABS
10.0000 mg | ORAL_TABLET | Freq: Every day | ORAL | Status: DC
  Administered 2018-07-19 (×2): 10 mg via ORAL
  Filled 2018-07-18 (×2): qty 1

## 2018-07-18 MED ORDER — DILTIAZEM LOAD VIA INFUSION
15.0000 mg | Freq: Once | INTRAVENOUS | Status: DC
  Filled 2018-07-18: qty 15

## 2018-07-18 MED ORDER — GUAIFENESIN ER 600 MG PO TB12
600.0000 mg | ORAL_TABLET | Freq: Two times a day (BID) | ORAL | Status: DC
  Administered 2018-07-19 – 2018-07-20 (×4): 600 mg via ORAL
  Filled 2018-07-18 (×4): qty 1

## 2018-07-18 MED ORDER — HEPARIN (PORCINE) 25000 UT/250ML-% IV SOLN
1700.0000 [IU]/h | INTRAVENOUS | Status: AC
  Administered 2018-07-18: 1200 [IU]/h via INTRAVENOUS
  Administered 2018-07-19: 1700 [IU]/h via INTRAVENOUS
  Filled 2018-07-18 (×2): qty 250

## 2018-07-18 MED ORDER — SODIUM CHLORIDE 0.9 % IV SOLN
INTRAVENOUS | Status: DC
  Administered 2018-07-19: via INTRAVENOUS

## 2018-07-18 MED ORDER — SODIUM CHLORIDE 0.9 % IV BOLUS
500.0000 mL | Freq: Once | INTRAVENOUS | Status: AC
  Administered 2018-07-18: 500 mL via INTRAVENOUS

## 2018-07-18 MED ORDER — LORATADINE 10 MG PO TABS
10.0000 mg | ORAL_TABLET | Freq: Every day | ORAL | Status: DC
  Administered 2018-07-19 – 2018-07-20 (×2): 10 mg via ORAL
  Filled 2018-07-18 (×2): qty 1

## 2018-07-18 MED ORDER — LISINOPRIL 10 MG PO TABS
20.0000 mg | ORAL_TABLET | Freq: Every day | ORAL | Status: DC
  Filled 2018-07-18: qty 1

## 2018-07-18 MED ORDER — ATORVASTATIN CALCIUM 40 MG PO TABS
40.0000 mg | ORAL_TABLET | Freq: Every day | ORAL | Status: DC
  Administered 2018-07-19 (×2): 40 mg via ORAL
  Filled 2018-07-18 (×2): qty 1

## 2018-07-18 MED ORDER — LATANOPROST 0.005 % OP SOLN
1.0000 [drp] | Freq: Every day | OPHTHALMIC | Status: DC
  Administered 2018-07-19: 1 [drp] via OPHTHALMIC
  Filled 2018-07-18: qty 2.5

## 2018-07-18 MED ORDER — SODIUM CHLORIDE 0.9 % IV SOLN
100.0000 mg | Freq: Once | INTRAVENOUS | Status: AC
  Administered 2018-07-18: 100 mg via INTRAVENOUS
  Filled 2018-07-18: qty 100

## 2018-07-18 MED ORDER — TRAZODONE HCL 100 MG PO TABS
100.0000 mg | ORAL_TABLET | Freq: Every day | ORAL | Status: DC
  Administered 2018-07-19 (×2): 100 mg via ORAL
  Filled 2018-07-18: qty 1
  Filled 2018-07-18: qty 2

## 2018-07-18 MED ORDER — METHYLPREDNISOLONE SODIUM SUCC 125 MG IJ SOLR
125.0000 mg | Freq: Once | INTRAMUSCULAR | Status: AC
  Administered 2018-07-18: 125 mg via INTRAVENOUS
  Filled 2018-07-18: qty 2

## 2018-07-18 MED ORDER — BISACODYL 10 MG RE SUPP: 10.0000 mg | Freq: Every day | RECTAL | Status: DC | PRN

## 2018-07-18 MED ORDER — LEVALBUTEROL HCL 0.63 MG/3ML IN NEBU: 0.6300 mg | INHALATION_SOLUTION | Freq: Four times a day (QID) | RESPIRATORY_TRACT | Status: DC | PRN

## 2018-07-18 MED ORDER — DILTIAZEM HCL 100 MG IV SOLR
5.0000 mg/h | INTRAVENOUS | Status: DC
  Filled 2018-07-18 (×5): qty 100

## 2018-07-18 MED ORDER — OXYCODONE-ACETAMINOPHEN 5-325 MG PO TABS: 1.0000 | ORAL_TABLET | ORAL | Status: DC | PRN

## 2018-07-18 MED ORDER — FENTANYL 25 MCG/HR TD PT72
25.0000 ug | MEDICATED_PATCH | TRANSDERMAL | Status: DC
  Administered 2018-07-19: 25 ug via TRANSDERMAL
  Filled 2018-07-18: qty 1

## 2018-07-18 MED ORDER — FAMOTIDINE IN NACL 20-0.9 MG/50ML-% IV SOLN
20.0000 mg | Freq: Once | INTRAVENOUS | Status: AC
  Administered 2018-07-18: 20 mg via INTRAVENOUS
  Filled 2018-07-18: qty 50

## 2018-07-18 MED ORDER — ONDANSETRON HCL 4 MG PO TABS: 4.0000 mg | ORAL_TABLET | Freq: Four times a day (QID) | ORAL | Status: DC | PRN

## 2018-07-18 MED ORDER — DIPHENHYDRAMINE HCL 50 MG/ML IJ SOLN
12.5000 mg | Freq: Once | INTRAMUSCULAR | Status: AC
  Administered 2018-07-18: 12.5 mg via INTRAVENOUS
  Filled 2018-07-18: qty 1

## 2018-07-18 MED ORDER — OXYCODONE-ACETAMINOPHEN 5-325 MG PO TABS
1.0000 | ORAL_TABLET | Freq: Once | ORAL | Status: AC
  Administered 2018-07-18: 1 via ORAL
  Filled 2018-07-18: qty 1

## 2018-07-18 MED FILL — Thrombin (Recombinant) For Soln 5000 Unit: CUTANEOUS | Qty: 5000 | Status: AC

## 2018-07-18 NOTE — ED Notes (Signed)
EKG completed and exported, waiting call back from provider

## 2018-07-18 NOTE — ED Notes (Signed)
Went in to start Cardizem gtt, pt noted to be in a NSR at at rate of 80. Drip not started, provider paged and waiting a call back

## 2018-07-18 NOTE — ED Triage Notes (Signed)
Pt had a shoulder surgery Friday, had a condom cath placed Friday night. Pt states it started swelling sat/sun and has gotten worse. He is still able to urinate but states it is red and swollen.

## 2018-07-18 NOTE — ED Notes (Signed)
Secure message sent to Dr. Roel Cluck.  Pt didn't bring his home pain medications with him that were due at 7pm and is requesting it.

## 2018-07-18 NOTE — ED Provider Notes (Addendum)
[facility] EMERGENCY DEPARTMENT Provider Note   CSN: [id number] Arrival date & time: 07/18/18  1543     History   Chief Complaint Chief Complaint  Patient presents with  . Groin Swelling    HPI [name] is a 67 y.o. male.  Erythema and slight tenderness in scrotum and penis since Friday.  Symptoms originally started when a condom catheter was placed after left shoulder surgery.  Patient stated that he had a burning sensation at that time.  Symptoms of worsened over the weekend.  No fever, sweats, chills.  He is not diabetic.  Patient is able to urinate.  Severity is moderate.  Patient makes symptoms worse.     Past Medical History:  Diagnosis Date  . Arthritis    oa and ddd  . Back pain, chronic    pt states he has 3 herniated disks--uses fentanyl patch for pain  . Blood transfusion without reported diagnosis 1950   rH negative"at birth only"  . BPH (benign prostatic hyperplasia)    frequent urination, not able to hold urine well  . 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 Cataracts, both eyes    worst on left  . Colon polyps   . Complication of anesthesia    pt had spinal for a knee replacement--"woke up" during surgery--and sick after surgery  . Depression   . Diverticulosis   . DVT (deep venous thrombosis) ([place]) 2001 or 2002   right leg-required hospitalization x 8 days  . DVT (deep venous thrombosis) ([place]) 1996   right  . Glaucoma    both eyes  . H/O hiatal hernia   . Hearing impaired person, bilateral    bilateral  . Hemorrhoid   . History of IBS   . History of kidney stones    x 1-passed  . Hyperlipidemia 2012  . Hypertension   . Lumbago   . Lumbar post-laminectomy syndrome   . PONV (postoperative nausea and vomiting)   . Sleep apnea    pt uses cpap - setting of 16  . Ulcer 1966  . Ulcerative colitis ([place])   . Wears glasses   . Wears hearing aid     Patient Active Problem List   Diagnosis Date Noted  .  H/O total shoulder replacement, left 07/15/2018  . S/P shoulder replacement, right 01/28/2018  . Robotic XI TAPP  bilateral inguinal hernia repair Dec 2017 07/31/2016  . S/P bilateral inguinal herniorrhaphy 07/31/2016  . Lumbar post-laminectomy syndrome   . 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 OA (osteoarthritis) of knee 11/17/2013  . OSA (obstructive sleep apnea) 12/05/2012  . GERD 12/23/2007  . RLQ PAIN 12/23/2007  . PERSONAL HX COLONIC POLYPS 12/23/2007    Past Surgical History:  Procedure Laterality Date  . APPENDECTOMY  1993  . BACK SURGERY  2008    bone spur removed   . bilateral carpal tunnel release 2009    . cervical fusion 2011--pt has good range of motion neck    . CHOLECYSTECTOMY  1994  . INSERTION OF MESH Bilateral 07/31/2016   Procedure: INSERTION OF MESH;  Surgeon: [name], MD;  Location: [facility];  Service: General;  Laterality: Bilateral;  . JOINT REPLACEMENT Bilateral    2001 left total knee and 1985 right total knee  . MASS EXCISION Left 01/04/2014   Procedure: LEFT MIDDLE FINGER MASS EXCISION WITH NAILBED REPAIR AND ADVANCEMENT FLAP;  Surgeon: [name], MD;  Location: [facility];  Service: Orthopedics;  Laterality: Left;  . mass removed from 2nd finger Left aug 2014   squamous carcinoma  . nissen fundoplication 1194    . PROSTATE SURGERY  2014   TURP  . right wrist fusion 12/12 - pt wearing brace on the wrist-not started physical therapy yet Right    fusion prevent limited ROM to right wrist  . SPINE SURGERY  2009   cervical spine fusion  . thyroid nodule     removed  . TOTAL KNEE REVISION Right 11/17/2013   Procedure: RIGHT KNEE POLYETHYLENE REVISION, bone graft;  Surgeon: [name], MD;  Location: [facility];  Service: Orthopedics;  Laterality: Right;  . TOTAL SHOULDER ARTHROPLASTY Right 01/28/2018   Procedure: RIGHT TOTAL SHOULDER ARTHROPLASTY;  Surgeon: [name], MD;  Location: [facility];  Service: Orthopedics;   Laterality: Right;  . TOTAL SHOULDER ARTHROPLASTY Left 07/15/2018   Procedure: LEFT SHOULDER ANATOMIC TOTAL SHOULDER ARTHROPLASTY;  Surgeon: [name], MD;  Location: [facility];  Service: Orthopedics;  Laterality: Left;  . TRANSURETHRAL RESECTION OF PROSTATE  10/09/2011   Procedure: TRANSURETHRAL RESECTION OF THE PROSTATE WITH GYRUS INSTRUMENTS;  Surgeon: Fredricka Bonine, MD;  Location: [facility];  Service: Urology;  Laterality: N/A;  . uvvvp    . XI ROBOTIC ASSISTED INGUINAL HERNIA REPAIR WITH MESH Bilateral 07/31/2016   Procedure: XI ROBOTIC BILATERAL INGUINAL HERNIA REPAIR WITH MESH;  Surgeon: [name], MD;  Location: [facility];  Service: General;  Laterality: Bilateral;        Home Medications    Prior to Admission medications   Medication Sig Start Date End Date Taking? Authorizing Provider  acetaminophen (TYLENOL) 500 MG tablet Take 1,000 mg by mouth every 6 (six) hours as needed.    [provider]  albuterol (PROVENTIL HFA;VENTOLIN HFA) 108 (90 Base) MCG/ACT inhaler Inhale 2 puffs into the lungs every 4 (four) hours as needed for wheezing or shortness of breath. 08/25/17   Alycia Rossetti, MD  aspirin 81 MG tablet Take 81 mg by mouth at bedtime.     [provider]  atorvastatin (LIPITOR) 40 MG tablet TAKE 1 TABLET BY MOUTH ONCE DAILY 03/24/18   [name], MD  Calcium Carbonate-Vitamin D (CALCIUM + D PO) Take 1 tablet by mouth at bedtime.     [provider]  cetirizine (ZYRTEC) 10 MG tablet Take 10 mg by mouth at bedtime.    [provider]  fentaNYL (DURAGESIC - DOSED MCG/HR) 25 MCG/HR Place 1 patch onto the skin every 3 (three) days.     [provider]  fluticasone (FLONASE) 50 MCG/ACT nasal spray Place 1 spray into both nostrils daily as needed for allergies.     [provider]  Latanoprost (XELPROS) 0.005 % EMUL Place 1 drop into both eyes at bedtime.    [provider]  lisinopril (PRINIVIL,ZESTRIL)  20 MG tablet TAKE 1 TABLET BY MOUTH ONCE DAILY Patient taking differently: TAKE 1 TABLET BY MOUTH ONCE DAILY AT BEDTIME 10/22/17   [name], MD  magnesium oxide (MAG-OX) 400 MG tablet Take 400 mg by mouth at bedtime.     [provider]  methocarbamol (ROBAXIN) 500 MG tablet Take 1 tablet (500 mg total) by mouth 3 (three) times daily as needed. 07/15/18   [name], MD  Multiple Vitamin (MULTIVITAMIN WITH MINERALS) TABS tablet Take 1 tablet by mouth at bedtime.     [provider]  Olopatadine HCl (PAZEO) 0.7 %  SOLN Place 1 drop into both eyes daily.    [provider]  Omega-3 Fatty Acids (FISH OIL) 500 MG CAPS Take 500 mg by mouth at bedtime.     [provider]  oxyCODONE-acetaminophen (PERCOCET) 5-325 MG tablet Take 1-2 tablets by mouth every 4 (four) hours as needed for moderate pain or severe pain. 07/15/18 07/15/19  [name], MD  traZODone (DESYREL) 100 MG tablet Take 100 mg by mouth at bedtime.  09/30/12   [provider]  vortioxetine HBr (TRINTELLIX) 10 MG TABS Take 1 tablet (10 mg total) by mouth daily. Patient taking differently: Take 10 mg by mouth at bedtime.  06/17/16   [name], MD    Family History Family History  Problem Relation Age of Onset  . Colon cancer Father   . Bone cancer Father        deceased  . Cancer Father   . Diabetes Father   . Prostate cancer Father   . Arthritis Mother   . Hearing loss Mother   . Hyperlipidemia Mother   . Hypertension Mother   . Deep vein thrombosis Mother   . Diabetes Sister   . Hearing loss Maternal Grandmother   . Diabetes Paternal Grandfather     Social History Social History   Tobacco Use  . Smoking status: Former Smoker    Packs/day: 1.00    Years: 19.00    Pack years: 19.00    Types: Cigarettes    Last attempt to quit: 08/04/1983    Years since quitting: 34.9  . Smokeless tobacco: Never Used  . Tobacco comment: quit smoking 1985  Substance Use  Topics  . Alcohol use: Yes    Alcohol/week: 6.0 standard drinks    Types: 6 Cans of beer per week    Comment: maybe 2 or 3 beers per week  . Drug use: No     Allergies   Adhesive [tape] and Other   Review of Systems Review of Systems  All other systems reviewed and are negative.    Physical Exam Updated Vital Signs BP (!) 121/106   Pulse 94   Temp 99.2 F (37.3 C)   Resp 19   Ht 5' 7"  (1.702 m)   Wt 104.3 kg   SpO2 97%   BMI 36.02 kg/m   Physical Exam Vitals signs and nursing note reviewed.  Constitutional:      Appearance: He is well-developed.  HENT:     Head: Normocephalic and atraumatic.  Eyes:     Conjunctiva/sclera: Conjunctivae normal.  Neck:     Musculoskeletal: Neck supple.  Cardiovascular:     Rate and Rhythm: Normal rate and regular rhythm.  Pulmonary:     Effort: Pulmonary effort is normal.     Breath sounds: Normal breath sounds.  Abdominal:     General: Bowel sounds are normal.     Palpations: Abdomen is soft.  Genitourinary:    Comments: Genitourinary exam: Scrotum is erythematous and slightly tender with pustules on the inferior aspect.  Shaft of penis and foreskin are erythematous and swollen Musculoskeletal: Normal range of motion.  Skin:    General: Skin is warm and dry.  Neurological:     Mental Status: He is alert and oriented to person, place, and time.  Psychiatric:        Behavior: Behavior normal.      ED Treatments / Results  Labs (all labs ordered are listed, but only abnormal results are displayed) Labs Reviewed  CBC WITH DIFFERENTIAL/PLATELET - Abnormal; Notable for the following components:      Result Value   Monocytes Absolute 1.4 (*)    All other components within normal limits  BASIC METABOLIC PANEL - Abnormal; Notable for the following components:   Glucose, Bld 134 (*)    All other components within normal limits    EKG None  Radiology No results found.  Procedures Procedures (including critical  care time)  Medications Ordered in ED Medications  doxycycline (VIBRAMYCIN) 100 mg in sodium chloride 0.9 % 250 mL IVPB (100 mg Intravenous New Bag/Given 07/18/18 1659)  sodium chloride 0.9 % bolus 500 mL (0 mLs Intravenous Stopped 07/18/18 1713)  methylPREDNISolone sodium succinate (SOLU-MEDROL) 125 mg/2 mL injection 125 mg (125 mg Intravenous Given 07/18/18 1640)  diphenhydrAMINE (BENADRYL) injection 12.5 mg (12.5 mg Intravenous Given 07/18/18 1640)  famotidine (PEPCID) IVPB 20 mg premix (0 mg Intravenous Stopped 07/18/18 1712)     Initial Impression / Assessment and Plan / ED Course  I have reviewed the triage vital signs and the nursing notes.  Pertinent labs & imaging results that were available during my care of the patient were reviewed by me and considered in my medical decision making (see chart for details).     Patient presents with concerns of erythema and edema in the penis and scrotum after a condom catheter was placed on Friday.  Physical exam reveals a combination of inflammation and infection.  White count normal.  Glucose minimally elevated.  IV doxycycline, IV Solu-Medrol, IV Benadryl, IV Pepcid administered.  Admit to general medicine.  Final Clinical Impressions(s) / ED Diagnoses   Final diagnoses:  Cellulitis, scrotum  Cellulitis, penis  Inflammation of penis    ED Discharge Orders    None       Nat Christen, MD 07/18/18 1751    Nat Christen, MD 07/18/18 [phone]

## 2018-07-18 NOTE — H&P (Addendum)
LIONARDO [name] QPR:[id number] DOB: 1951/02/17 DOA: 07/18/2018     PCP: [name], MD   Outpatient Specialists:    Cardiology H. Wilhemena Durie Dr. Veverly Fells Patient arrived to ER on 07/18/18 at 1543  Patient coming from: home Lives  With family    Chief Complaint:  Chief Complaint  Patient presents with  . Groin Swelling    HPI: [name] is a 67 y.o. 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  While hospitalized patient noted some redness and swelling of the penis he had condom cath in place and that was bothering him somewhat since he came home he noted more swelling and redness now  involving his scrotum as well no fever or chills no urinary retention he was using condom cath he reports burning sensation did not take any medications to make it better  the burning started only after nurse cleaned the area up.  The area that was cleaned was all burning not just under the condom cath he has every time he has surgery he brakes down with blisters where the tape has been aplied   No chest pain not short of breath but have been diaphoretic all day He has been coughing for the past few days with low grade fever 2 days ago.   He took some tylenol and it improved Reports decreased PO intake today no lighheadedness No easy bleeding no melena  no blood per rectum No prior hx of CVA or A.fib  Regarding pertinent Chronic problems: History of back pain and shoulder pain followed by orthopedics status post recent repair of left shoulder History of diabetes With hyperlipidemia on Lipitor chronic pain on fentanyl  patch History  of hypertension on lisinopril  Remote hx of stress test done by Daneen Schick  Hx of DVT in left leg 2004 in setting of injury  While in ER: Given recent exposure to condom cath allergic reaction could not be ruled out he received Solu-Medrol 125 IV Benadryl, IV Pepcid administered in emergency department but also felt possibility of infection therefore was given a dose of doxycycline  The following Work up has been ordered so far:  Orders Placed This Encounter  Procedures  . CBC with Differential  . Basic metabolic panel  . Consult to hospitalist     Following Medications were ordered in ER: Medications  oxyCODONE-acetaminophen (PERCOCET/ROXICET) 5-325 MG per tablet 1 tablet (has no administration in time range)  sodium chloride 0.9 % bolus 500 mL (0 mLs Intravenous Stopped 07/18/18 1713)  doxycycline (VIBRAMYCIN) 100 mg in sodium chloride 0.9 % 250 mL IVPB (0 mg Intravenous Stopped 07/18/18 1859)  methylPREDNISolone sodium succinate (SOLU-MEDROL) 125 mg/2 mL injection 125 mg (125 mg Intravenous Given 07/18/18 1640)  diphenhydrAMINE (BENADRYL) injection 12.5 mg (12.5 mg Intravenous Given 07/18/18 1640)  famotidine (PEPCID) IVPB 20 mg premix (0 mg Intravenous Stopped 07/18/18 1712)    Significant initial  Findings: Abnormal Labs Reviewed  CBC WITH DIFFERENTIAL/PLATELET - Abnormal; Notable for the following components:      Result Value   Monocytes Absolute 1.4 (*)    All other components within normal limits  BASIC METABOLIC  PANEL - Abnormal; Notable for the following components:   Glucose, Bld 134 (*)    All other components within normal limits     Lactic Acid, Venous No results found for: LATICACIDVEN  Na 140 K 3.7  Cr  Stable,  Lab Results  Component Value Date   CREATININE 1.01 07/18/2018   CREATININE 1.17 07/16/2018   CREATININE 1.11 07/07/2018    WBC  8.2  HG/HCT  stable,      Component Value Date/Time   HGB 13.7 07/18/2018 1645   HCT 40.3  07/18/2018 1645      UA   ordered    ECG:   HR 112 A.fib/a.flatter QTC 439     ED Triage Vitals  Enc Vitals Group     BP 07/18/18 1607 (!) 121/106     Pulse Rate 07/18/18 1607 94     Resp 07/18/18 1607 19     Temp 07/18/18 1607 99.2 F (37.3 C)     Temp src --      SpO2 07/18/18 1607 97 %     Weight 07/18/18 1659 230 lb (104.3 kg)     Height 07/18/18 1659 5' 7"  (1.702 m)     Head Circumference --      Peak Flow --      Pain Score 07/18/18 1608 0     Pain Loc --      Pain Edu? --      Excl. in [place]? --   TMAX(24)@       Latest  Blood pressure (!) 121/106, pulse 94, temperature 99.2 F (37.3 C), resp. rate 19, height 5' 7"  (1.702 m), weight 104.3 kg, SpO2 97 %.     Hospitalist was called for admission for a.fib w RVR   Review of Systems:    Pertinent positives include: Scrotal swelling and pain  Constitutional:  No weight loss, night sweats, Fevers, chills, fatigue, weight loss  HEENT:  No headaches, Difficulty swallowing,Tooth/dental problems,Sore throat,  No sneezing, itching, ear ache, nasal congestion, post nasal drip,  Cardio-vascular:  No chest pain, Orthopnea, PND, anasarca, dizziness, palpitations.no Bilateral lower extremity swelling  GI:  No heartburn, indigestion, abdominal pain, nausea, vomiting, diarrhea, change in bowel habits, loss of appetite, melena, blood in stool, hematemesis Resp:  no shortness of breath at rest. No dyspnea on exertion, No excess mucus, no productive cough, No non-productive cough, No coughing up of blood.No change in color of mucus.No wheezing. Skin:  no rash or lesions. No jaundice GU:  no dysuria, change in color of urine, no urgency or frequency. No straining to urinate.  No flank pain.  Musculoskeletal:  No joint pain or no joint swelling. No decreased range of motion. No back pain.  Psych:  No change in mood or affect. No depression or anxiety. No memory loss.  Neuro: no localizing neurological complaints, no  tingling, no weakness, no double vision, no gait abnormality, no slurred speech, no confusion  All systems reviewed and apart from [name] all are negative  Past Medical History:   Past Medical History:  Diagnosis Date  . Arthritis    oa and ddd  . Back pain, chronic    pt states he has 3 herniated disks--uses fentanyl patch for pain  . Blood transfusion without reported diagnosis 1950   rH negative"at birth only"  . BPH (benign prostatic hyperplasia)    frequent urination, not able to hold urine well  . 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  Cataracts, both eyes    worst on left  . Colon polyps   . Complication of anesthesia    pt had spinal for a knee replacement--"woke up" during surgery--and sick after surgery  . Depression   . Diverticulosis   . DVT (deep venous thrombosis) ([place]) 2001 or 2002   right leg-required hospitalization x 8 days  . DVT (deep venous thrombosis) ([place]) 1996   right  . Glaucoma    both eyes  . H/O hiatal hernia   . Hearing impaired person, bilateral    bilateral  . Hemorrhoid   . History of IBS   . History of kidney stones    x 1-passed  . Hyperlipidemia 2012  . Hypertension   . Lumbago   . Lumbar post-laminectomy syndrome   . PONV (postoperative nausea and vomiting)   . Sleep apnea    pt uses cpap - setting of 16  . Ulcer 1966  . Ulcerative colitis ([place])   . Wears glasses   . Wears hearing aid      Past Surgical History:  Procedure Laterality Date  . APPENDECTOMY  1993  . BACK SURGERY  2008    bone spur removed   . bilateral carpal tunnel release 2009    . cervical fusion 2011--pt has good range of motion neck    . CHOLECYSTECTOMY  1994  . INSERTION OF MESH Bilateral 07/31/2016   Procedure: INSERTION OF MESH;  Surgeon: [name], MD;  Location: [facility];  Service: General;  Laterality: Bilateral;  . JOINT REPLACEMENT Bilateral    2001 left total knee and 1985 right total knee  . MASS EXCISION Left 01/04/2014    Procedure: LEFT MIDDLE FINGER MASS EXCISION WITH NAILBED REPAIR AND ADVANCEMENT FLAP;  Surgeon: [name], MD;  Location: [facility];  Service: Orthopedics;  Laterality: Left;  . mass removed from 2nd finger Left aug 2014   squamous carcinoma  . nissen fundoplication [id number]    . PROSTATE SURGERY  2014   TURP  . right wrist fusion 12/12 - pt wearing brace on the wrist-not started physical therapy yet Right    fusion prevent limited ROM to right wrist  . SPINE SURGERY  2009   cervical spine fusion  . thyroid nodule     removed  . TOTAL KNEE REVISION Right 11/17/2013   Procedure: RIGHT KNEE POLYETHYLENE REVISION, bone graft;  Surgeon: [name], MD;  Location: [facility];  Service: Orthopedics;  Laterality: Right;  . TOTAL SHOULDER ARTHROPLASTY Right 01/28/2018   Procedure: RIGHT TOTAL SHOULDER ARTHROPLASTY;  Surgeon: [name], MD;  Location: [facility];  Service: Orthopedics;  Laterality: Right;  . TOTAL SHOULDER ARTHROPLASTY Left 07/15/2018   Procedure: LEFT SHOULDER ANATOMIC TOTAL SHOULDER ARTHROPLASTY;  Surgeon: [name], MD;  Location: [facility];  Service: Orthopedics;  Laterality: Left;  . TRANSURETHRAL RESECTION OF PROSTATE  10/09/2011   Procedure: TRANSURETHRAL RESECTION OF THE PROSTATE WITH GYRUS INSTRUMENTS;  Surgeon: Fredricka Bonine, MD;  Location: [facility];  Service: Urology;  Laterality: N/A;  . uvvvp    . XI ROBOTIC ASSISTED INGUINAL HERNIA REPAIR WITH MESH Bilateral 07/31/2016   Procedure: XI ROBOTIC BILATERAL INGUINAL HERNIA REPAIR WITH MESH;  Surgeon: [name], MD;  Location: [facility];  Service: General;  Laterality: Bilateral;    Social History:  Ambulatory   independently      reports that he quit smoking about 34 years ago. His smoking use included cigarettes. He has a 19.00 pack-year smoking  history. He has never used smokeless tobacco. He reports current alcohol use of about 6.0 standard drinks of alcohol per week. He reports that he does not  use drugs.   Family History:   Family History  Problem Relation Age of Onset  . Colon cancer Father   . Bone cancer Father        deceased  . Cancer Father   . Diabetes Father   . Prostate cancer Father   . Arthritis Mother   . Hearing loss Mother   . Hyperlipidemia Mother   . Hypertension Mother   . Deep vein thrombosis Mother   . Diabetes Sister   . Hearing loss Maternal Grandmother   . Diabetes Paternal Grandfather     Allergies: Allergies  Allergen Reactions  . Adhesive [Tape] Other (See Comments)    UNSPECIFIED REACTION   . Other Other (See Comments)    "Adhesives"Pt allergic to some tapes--pt request that we only use paper tape!!     Prior to Admission medications   Medication Sig Start Date End Date Taking? Authorizing Provider  acetaminophen (TYLENOL) 500 MG tablet Take 1,000 mg by mouth every 6 (six) hours as needed.    [provider]  albuterol (PROVENTIL HFA;VENTOLIN HFA) 108 (90 Base) MCG/ACT inhaler Inhale 2 puffs into the lungs every 4 (four) hours as needed for wheezing or shortness of breath. 08/25/17   Alycia Rossetti, MD  aspirin 81 MG tablet Take 81 mg by mouth at bedtime.     [provider]  atorvastatin (LIPITOR) 40 MG tablet TAKE 1 TABLET BY MOUTH ONCE DAILY 03/24/18   [name], MD  Calcium Carbonate-Vitamin D (CALCIUM + D PO) Take 1 tablet by mouth at bedtime.     [provider]  cetirizine (ZYRTEC) 10 MG tablet Take 10 mg by mouth at bedtime.    [provider]  fentaNYL (DURAGESIC - DOSED MCG/HR) 25 MCG/HR Place 1 patch onto the skin every 3 (three) days.     [provider]  fluticasone (FLONASE) 50 MCG/ACT nasal spray Place 1 spray into both nostrils daily as needed for allergies.     [provider]  Latanoprost (XELPROS) 0.005 % EMUL Place 1 drop into both eyes at bedtime.    [provider]  lisinopril (PRINIVIL,ZESTRIL) 20 MG tablet TAKE 1 TABLET BY MOUTH ONCE  DAILY Patient taking differently: TAKE 1 TABLET BY MOUTH ONCE DAILY AT BEDTIME 10/22/17   [name], MD  magnesium oxide (MAG-OX) 400 MG tablet Take 400 mg by mouth at bedtime.     [provider]  methocarbamol (ROBAXIN) 500 MG tablet Take 1 tablet (500 mg total) by mouth 3 (three) times daily as needed. 07/15/18   [name], MD  Multiple Vitamin (MULTIVITAMIN WITH MINERALS) TABS tablet Take 1 tablet by mouth at bedtime.     [provider]  Olopatadine HCl (PAZEO) 0.7 % SOLN Place 1 drop into both eyes daily.    [provider]  Omega-3 Fatty Acids (FISH OIL) 500 MG CAPS Take 500 mg by mouth at bedtime.     [provider]  oxyCODONE-acetaminophen (PERCOCET) 5-325 MG tablet Take 1-2 tablets by mouth every 4 (four) hours as needed for moderate pain or severe pain. 07/15/18 07/15/19  [name], MD  traZODone (DESYREL) 100 MG tablet Take 100 mg by mouth at bedtime.  09/30/12   [provider]  vortioxetine HBr (TRINTELLIX) 10 MG TABS Take 1 tablet (10 mg  total) by mouth daily. Patient taking differently: Take 10 mg by mouth at bedtime.  06/17/16   [name], MD   Physical Exam: Blood pressure (!) 121/106, pulse 94, temperature 99.2 F (37.3 C), resp. rate 19, height 5' 7"  (1.702 m), weight 104.3 kg, SpO2 97 %. 1. General:  in No Acute distress  well  -appearing 2. Psychological: Alert and  Oriented 3. Head/ENT:    Dry Mucous Membranes                          Head Non traumatic, neck supple                            Poor Dentition 4. SKIN:   decreased Skin turgor,  Skin clean Dry and intact no rash 5. Heart: irregular rate and rhythm no  Murmur, no Rub or gallop 6. Lungs:  Clear to auscultation bilaterally, no wheezes or crackles   7. Abdomen: Soft,  non-tender, Non distended  obese  bowel sounds present 8. Lower extremities: no clubbing, cyanosis, or edema 9. Neurologically Grossly intact, moving all 4 extremities equally   10. MSK: Normal range of motion   LABS:     Recent Labs  Lab 07/16/18 0318 07/18/18 1645  WBC  --  8.2  NEUTROABS  --  4.9  HGB 12.7* 13.7  HCT 38.9* 40.3  MCV  --  91.6  PLT  --  292   Basic Metabolic Panel: Recent Labs  Lab 07/16/18 0318 07/18/18 1645  NA 138 140  K 4.6 3.7  CL 105 103  CO2 22 26  GLUCOSE 137* 134*  BUN 16 13  CREATININE 1.17 1.01  CALCIUM 8.6* 9.1     No results for input(s): AST, ALT, ALKPHOS, BILITOT, PROT, ALBUMIN in the last 168 hours. No results for input(s): LIPASE, AMYLASE in the last 168 hours. No results for input(s): AMMONIA in the last 168 hours.    HbA1C: No results for input(s): HGBA1C in the last 72 hours. CBG: No results for input(s): GLUCAP in the last 168 hours.    Urine analysis:    Cultures:    Component Value Date/Time   SDES URINE, CLEAN CATCH 09/09/2014 1226   SPECREQUEST Normal 09/09/2014 1226   CULT  09/09/2014 1226    PROTEUS MIRABILIS Performed at [facility] 09/11/2014 FINAL 09/09/2014 1226     Radiological Exams on Admission:   Chart has been reviewed    Assessment/Plan  67 y.o. male with medical history significant of chronic back pain, BPH, depression, history of DVT, glaucoma, hyperlipidemia, history of kidney stones, sleep apnea on CPAP, ulcerative colitis Admitted for new onset a.fib w RVR and allergic reaction of the scrotum  Present on Admission: . Atrial fibrillation with RVR (HCC) -  - Admit to step down initial plan was for  Cardizem drip but while in Er pt converted back to Sinus rhythm       CHA2D-VASC score 2       Will start on  Heparin,  Spoke with Orthopedics on Call ok with heparin      Check TSH      Cycle cardiac enzymes      Obtain ECHO      Cardiology consult in AM emailed  . OSA (obstructive sleep apnea) - CPAP ordered . GERD - stable cont home medicaitons . Essential hypertension - hold  lisinopril for tonight as will start diltiazem drip .  Hyperlipidemia - continue home medications . Allergic reaction - supportive treatment, in ER already got a dose of solumedrol may benefit from topical steroid cream doubt infection given history But will look for any changes, hold off on antibiotics for now  Respiratory illness - CXR wnl Check respiratory panel, prn xopenex    Other plan as per orders.  DVT prophylaxis:  SCD      Code Status:  FULL CODE  as per patient    I had personally discussed CODE STATUS with patient and family   Family Communication:   Family at  Bedside  plan of care was discussed with   Wife   Disposition Plan:       To home once workup is complete and patient is stable                                        Consults called: emailed cardiology Notified orthopedics that pt has been admitted   Admission status:    Obs    Level of care      medical floor       Toy Baker 07/18/2018, 10:28 PM    [facility]  Pager [phone]   after 2 AM please page floor coverage PA If 7AM-7PM, please contact the day team taking care of the patient  [email or web address]  Password [email or web address]

## 2018-07-18 NOTE — Progress Notes (Signed)
ANTICOAGULATION CONSULT NOTE - Initial Consult  Pharmacy Consult for Heparin Indication: atrial fibrillation  Allergies  Allergen Reactions  . Adhesive [Tape] Other (See Comments)    Causes blisters - pls use paper tape    Patient Measurements: Height: 5' 7"  (170.2 cm) Weight: 230 lb (104.3 kg) IBW/kg (Calculated) : 66.1 Heparin Dosing Weight: 89.1 kg  Vital Signs: Temp: 99.2 F (37.3 C) (12/16 1607) Temp Source: Oral (12/16 2008) BP: 142/102 (12/16 2008) Pulse Rate: 96 (12/16 2008)  Labs: Recent Labs    07/16/18 0318 07/18/18 1645  HGB 12.7* 13.7  HCT 38.9* 40.3  PLT  --  180  CREATININE 1.17 1.01    Estimated Creatinine Clearance: 79.5 mL/min (by C-G formula based on SCr of 1.01 mg/dL).   Medical History: Past Medical History:  Diagnosis Date  . Arthritis    oa and ddd  . Back pain, chronic    pt states he has 3 herniated disks--uses fentanyl patch for pain  . Blood transfusion without reported diagnosis 1950   rH negative"at birth only"  . BPH (benign prostatic hyperplasia)    frequent urination, not able to hold urine well  . 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 Cataracts, both eyes    worst on left  . Colon polyps   . Complication of anesthesia    pt had spinal for a knee replacement--"woke up" during surgery--and sick after surgery  . Depression   . Diverticulosis   . DVT (deep venous thrombosis) ([place]) 2001 or 2002   right leg-required hospitalization x 8 days  . DVT (deep venous thrombosis) ([place]) 1996   right  . Glaucoma    both eyes  . H/O hiatal hernia   . Hearing impaired person, bilateral    bilateral  . Hemorrhoid   . History of IBS   . History of kidney stones    x 1-passed  . Hyperlipidemia 2012  . Hypertension   . Lumbago   . Lumbar post-laminectomy syndrome   . PONV (postoperative nausea and vomiting)   . Sleep apnea    pt uses cpap - setting of 16  . Ulcer 1966  . Ulcerative colitis ([place])   . Wears  glasses   . Wears hearing aid     Assessment: 50 YOM who presented to the [facility] on [facility]/16 with scrotal pain/swelling after requiring a condom cath for L-shoulder surgery on 12/14. Work up also revealed Afib with RVR and pharmacy was consulted to start Heparin for anticoagulation - no bolus requested per MD due to the recent shoulder surgery.  Baseline CBC wnl  Goal of Therapy:  Heparin level 0.3-0.7 units/ml Monitor platelets by anticoagulation protocol: Yes   Plan:  - Start Heparin at 1200 units/hr (12 ml/hr) - Daily HL, CBC - Will continue to monitor for any signs/symptoms of bleeding and will follow up with heparin level in 6 hours   Thank you for allowing pharmacy to be a part of this patient's care.  Alycia Rossetti, PharmD, BCPS Clinical Pharmacist Please check AMION for all [place] numbers 07/18/2018 9:03 PM

## 2018-07-18 NOTE — ED Notes (Signed)
ED Provider at bedside. 

## 2018-07-18 NOTE — Progress Notes (Signed)
Pt has 25 mcg Fentanyl patch on right chest. Placed last night per pt

## 2018-07-19 ENCOUNTER — Ambulatory Visit (HOSPITAL_BASED_OUTPATIENT_CLINIC_OR_DEPARTMENT_OTHER): Payer: Medicare Other

## 2018-07-19 ENCOUNTER — Encounter (HOSPITAL_COMMUNITY): Payer: Self-pay | Admitting: *Deleted

## 2018-07-19 ENCOUNTER — Other Ambulatory Visit: Payer: Self-pay | Admitting: Student

## 2018-07-19 DIAGNOSIS — I4891 Unspecified atrial fibrillation: Secondary | ICD-10-CM

## 2018-07-19 DIAGNOSIS — I10 Essential (primary) hypertension: Secondary | ICD-10-CM | POA: Diagnosis not present

## 2018-07-19 DIAGNOSIS — I361 Nonrheumatic tricuspid (valve) insufficiency: Secondary | ICD-10-CM

## 2018-07-19 DIAGNOSIS — T7840XA Allergy, unspecified, initial encounter: Secondary | ICD-10-CM | POA: Diagnosis not present

## 2018-07-19 DIAGNOSIS — N4829 Other inflammatory disorders of penis: Secondary | ICD-10-CM

## 2018-07-19 DIAGNOSIS — T7840XD Allergy, unspecified, subsequent encounter: Secondary | ICD-10-CM | POA: Diagnosis not present

## 2018-07-19 LAB — HEPARIN LEVEL (UNFRACTIONATED)
Heparin Unfractionated: 0.11 IU/mL — ABNORMAL LOW (ref 0.30–0.70)
Heparin Unfractionated: 0.21 IU/mL — ABNORMAL LOW (ref 0.30–0.70)

## 2018-07-19 LAB — RESPIRATORY PANEL BY PCR
ADENOVIRUS-RVPPCR: NOT DETECTED
Bordetella pertussis: NOT DETECTED
CORONAVIRUS 229E-RVPPCR: NOT DETECTED
CORONAVIRUS NL63-RVPPCR: NOT DETECTED
Chlamydophila pneumoniae: NOT DETECTED
Coronavirus HKU1: NOT DETECTED
Coronavirus OC43: NOT DETECTED
Influenza A: NOT DETECTED
Influenza B: NOT DETECTED
Metapneumovirus: NOT DETECTED
Mycoplasma pneumoniae: NOT DETECTED
Parainfluenza Virus 1: NOT DETECTED
Parainfluenza Virus 2: NOT DETECTED
Parainfluenza Virus 3: NOT DETECTED
Parainfluenza Virus 4: NOT DETECTED
Respiratory Syncytial Virus: NOT DETECTED
Rhinovirus / Enterovirus: NOT DETECTED

## 2018-07-19 LAB — COMPREHENSIVE METABOLIC PANEL
ALT: 27 U/L (ref 0–44)
AST: 27 U/L (ref 15–41)
Albumin: 3.1 g/dL — ABNORMAL LOW (ref 3.5–5.0)
Alkaline Phosphatase: 50 U/L (ref 38–126)
Anion gap: 10 (ref 5–15)
BUN: 16 mg/dL (ref 8–23)
CALCIUM: 8.9 mg/dL (ref 8.9–10.3)
CO2: 23 mmol/L (ref 22–32)
Chloride: 106 mmol/L (ref 98–111)
Creatinine, Ser: 1.45 mg/dL — ABNORMAL HIGH (ref 0.61–1.24)
GFR calc Af Amer: 57 mL/min — ABNORMAL LOW (ref >60)
GFR calc non Af Amer: 49 mL/min — ABNORMAL LOW (ref >60)
Glucose, Bld: 221 mg/dL — ABNORMAL HIGH (ref 70–99)
Potassium: 4.9 mmol/L (ref 3.5–5.1)
Sodium: 139 mmol/L (ref 135–145)
Total Bilirubin: 1 mg/dL (ref 0.3–1.2)
Total Protein: 6.3 g/dL — ABNORMAL LOW (ref 6.5–8.1)

## 2018-07-19 LAB — HEMOGLOBIN A1C
Hgb A1c MFr Bld: 5.1 % (ref 4.8–5.6)
Mean Plasma Glucose: 99.67 mg/dL

## 2018-07-19 LAB — CBC
HCT: 37.7 % — ABNORMAL LOW (ref 39.0–52.0)
HCT: 34.4 % — ABNORMAL LOW (ref 39.0–52.0)
Hemoglobin: 12.6 g/dL — ABNORMAL LOW (ref 13.0–17.0)
Hemoglobin: 11.8 g/dL — ABNORMAL LOW (ref 13.0–17.0)
MCH: 31.1 pg (ref 26.0–34.0)
MCH: 31.2 pg (ref 26.0–34.0)
MCHC: 33.4 g/dL (ref 30.0–36.0)
MCHC: 34.3 g/dL (ref 30.0–36.0)
MCV: 93.1 fL (ref 80.0–100.0)
MCV: 91 fL (ref 80.0–100.0)
Platelets: 162 10*3/uL (ref 150–400)
Platelets: 177 10*3/uL (ref 150–400)
RBC: 4.05 MIL/uL — ABNORMAL LOW (ref 4.22–5.81)
RBC: 3.78 MIL/uL — ABNORMAL LOW (ref 4.22–5.81)
RDW: 12 % (ref 11.5–15.5)
RDW: 12.1 % (ref 11.5–15.5)
WBC: 5.2 10*3/uL (ref 4.0–10.5)
WBC: 8 10*3/uL (ref 4.0–10.5)
nRBC: 0 % (ref 0.0–0.2)
nRBC: 0 % (ref 0.0–0.2)

## 2018-07-19 LAB — HIV ANTIBODY (ROUTINE TESTING W REFLEX): HIV Screen 4th Generation wRfx: NONREACTIVE

## 2018-07-19 LAB — MAGNESIUM: Magnesium: 1.9 mg/dL (ref 1.7–2.4)

## 2018-07-19 LAB — PHOSPHORUS: Phosphorus: 2.1 mg/dL — ABNORMAL LOW (ref 2.5–4.6)

## 2018-07-19 LAB — LIPID PANEL
Cholesterol: 115 mg/dL (ref 0–200)
HDL: 38 mg/dL — ABNORMAL LOW (ref >40)
LDL Cholesterol: 67 mg/dL (ref 0–99)
Total CHOL/HDL Ratio: 3 RATIO
Triglycerides: 48 mg/dL (ref <150)
VLDL: 10 mg/dL (ref 0–40)

## 2018-07-19 LAB — TROPONIN I: Troponin I: <0.03 ng/mL (ref <0.03)

## 2018-07-19 LAB — ECHOCARDIOGRAM COMPLETE
Height: 67 in
WEIGHTICAEL: 3784 [oz_av]

## 2018-07-19 LAB — TSH: TSH: 0.83 u[IU]/mL (ref 0.350–4.500)

## 2018-07-19 MED ORDER — HEPARIN BOLUS VIA INFUSION
3000.0000 [IU] | Freq: Once | INTRAVENOUS | Status: DC
  Filled 2018-07-19: qty 3000

## 2018-07-19 MED ORDER — PERFLUTREN LIPID MICROSPHERE
1.0000 mL | INTRAVENOUS | Status: DC | PRN
  Administered 2018-07-19: 3 mL via INTRAVENOUS
  Filled 2018-07-19 (×2): qty 10

## 2018-07-19 MED ORDER — RIVAROXABAN 20 MG PO TABS
20.0000 mg | ORAL_TABLET | Freq: Every day | ORAL | Status: DC
  Administered 2018-07-19: 20 mg via ORAL
  Filled 2018-07-19: qty 1

## 2018-07-19 MED ORDER — ACETAMINOPHEN 325 MG PO TABS: 650.0000 mg | ORAL_TABLET | Freq: Four times a day (QID) | ORAL | Status: DC | PRN

## 2018-07-19 NOTE — Progress Notes (Signed)
ANTICOAGULATION CONSULT NOTE - Follow Up Consult  Pharmacy Consult for Heparin Indication: atrial fibrillation  Allergies  Allergen Reactions  . Adhesive [Tape] Other (See Comments)    Causes blisters - pls use paper tape    Patient Measurements: Height: 5' 7"  (170.2 cm) Weight: 236 lb 8 oz (107.3 kg)(scale A) IBW/kg (Calculated) : 66.1 Heparin Dosing Weight: 90 kg  Vital Signs: Temp: 97.5 F (36.4 C) (12/17 1200) Temp Source: Oral (12/17 1200) BP: 108/73 (12/17 1200) Pulse Rate: 79 (12/17 1200)  Labs: Recent Labs    07/18/18 1645 07/18/18 2106 07/19/18 0323 07/19/18 0802 07/19/18 1127  HGB 13.7  --  12.6* 11.8*  --   HCT 40.3  --  37.7* 34.4*  --   PLT 180  --  162 177  --   HEPARINUNFRC  --   --  0.11*  --  0.21*  CREATININE 1.01  --  1.45*  --   --   TROPONINI  --  <0.03 <0.03 <0.03  --     Estimated Creatinine Clearance: 56.2 mL/min (A) (by C-G formula based on SCr of 1.45 mg/dL (H)).  Assessment:  67 yr old male continues on IV heparin for atrial fibrillation. Hx recent shoulder surgery on 12/13.  No heparin boluses to be given.     Heparin level remains low (0.21) on 1500 units/hr.  Goal of Therapy:  Heparin level 0.3-0.7 units/ml Monitor platelets by anticoagulation protocol: Yes   Plan:   Increase heparin drip to 1700 units/hr.  Heparin level ~6 hrs after rate change.  Daily heparin level and CBC.  Arty Baumgartner, [place] Pager: [phone] or phone: [phone] 07/19/2018,1:33 PM

## 2018-07-19 NOTE — Progress Notes (Signed)
ANTICOAGULATION CONSULT NOTE - Follow Up Consult  Pharmacy Consult for Heparin Indication: atrial fibrillation  Allergies  Allergen Reactions  . Adhesive [Tape] Other (See Comments)    Causes blisters - pls use paper tape    Patient Measurements: Height: 5' 7"  (170.2 cm) Weight: 236 lb 8 oz (107.3 kg)(scale A) IBW/kg (Calculated) : 66.1 Heparin Dosing Weight: 90 kg  Vital Signs: Temp: 97.7 F (36.5 C) (12/17 1645) Temp Source: Oral (12/17 1645) BP: 127/80 (12/17 1645) Pulse Rate: 79 (12/17 1645)  Labs: Recent Labs    07/18/18 1645 07/18/18 2106 07/19/18 0323 07/19/18 0802 07/19/18 1127  HGB 13.7  --  12.6* 11.8*  --   HCT 40.3  --  37.7* 34.4*  --   PLT 180  --  162 177  --   HEPARINUNFRC  --   --  0.11*  --  0.21*  CREATININE 1.01  --  1.45*  --   --   TROPONINI  --  <0.03 <0.03 <0.03  --     Estimated Creatinine Clearance: 56.2 mL/min (A) (by C-G formula based on SCr of 1.45 mg/dL (H)).  Assessment:  67 yr old male continues on IV heparin for atrial fibrillation. Hx recent shoulder surgery on 12/13.  No heparin boluses to be given.     New orders to transition patient to xarelto this evening. Will stop heparin at time Xarelto to be given.   Goal of Therapy:  Heparin level 0.3-0.7 units/ml Monitor platelets by anticoagulation protocol: Yes   Plan:  D/c heparin Xarelto 40m tonight then daily wth supper  [name] HearingPharmD., BCPS Clinical Pharmacist 07/19/2018 5:44 PM

## 2018-07-19 NOTE — Consult Note (Addendum)
Cardiology Consult    Patient ID: [name] MRN: [id number], DOB/AGE: June 17, 1951   Admit date: 07/18/2018 Date of Consult: 07/19/2018  Primary Physician: [name], MD Primary Cardiologist: New Consult (Dr. Harrell Gave) Requesting Provider: Toy Baker, MD  Patient Profile    [name] is an obese 67 y.o. Caucasian male with a history of hypertension, hyperlipidemia, obstructive sleep apnea on CPAP, DVT in 2004 which required hospitalization, chronic back pain, BPH, s/p recent left total shoulder arthroplasty who is being seen today for the evaluation of atrial fibrillation at the request of Dr. Roel Cluck.  History of Present Illness    [name] is a 67 year old male with the above history. He has no known heart disease and has never seen a Cardiologist. He has had two stress tests which were reportedly normal (sounds like PCP ordered these as a screening test). He was recently admitted from 07/15/2018 to 07/16/2018 for a left total shoulder arthroplasty and tolerated the procedure well. He returned to the [facility] ED on 07/18/2018 for evaluation of penile and scrotal pain after a condom catheter was placed on 07/15/2018 for his shoulder surgery and has worsened over the last several days. Upon arrival to the ED, EKG showed atrial fibrillation with ventricular rate of 112 bpm. Before patient could be started on Cardizem drip, patient spontaneously cardioverted back into sinus rhythm. Cardiac enzymes were ordered and patient was started on IV Heparin. Cardiology was consulted.  Patient denies any palpitations and states he was unaware of when he was in atrial fibrillation. Patient reports recent URI symptoms with a productive cough and a mild sore throat over the last couple of days. He reports a mild fever of 100.6 after coming home from the hospital on 07/16/2018 which resolved after taking a Tylenol. On 07/17/2018, he reports having intermittent episode of  diaphoresis that would last hours at a time. He reports having episodes of diaphoresis about 1-2 times per week (often in the morning) but states they usually don't last as long. He denies any cardiac symptoms with this. No chest pain, shortness of breath, lightheadedness/dizziness, or nausea. He also denies any recent history of exertional symptoms.   Currently, patient is feeling well. Penile and scrotal swelling felt to be due to an allergic reaction and is now improving. Patient denies any episodes of diaphoresis since yesterday.   Patient reports previously tobacco use but quit over 30 years ago. He does have a family history of heart disease with multiple family members having a "heart attack."  Past Medical History   Past Medical History:  Diagnosis Date  . Arthritis    oa and ddd  . Back pain, chronic    pt states he has 3 herniated disks--uses fentanyl patch for pain  . Blood transfusion without reported diagnosis 1950   rH negative"at birth only"  . BPH (benign prostatic hyperplasia)    frequent urination, not able to hold urine well  . 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 Cataracts, both eyes    worst on left  . Colon polyps   . Complication of anesthesia    pt had spinal for a knee replacement--"woke up" during surgery--and sick after surgery  . Depression   . Diverticulosis   . DVT (deep venous thrombosis) ([place]) 2001 or 2002   right leg-required hospitalization x 8 days  . DVT (deep venous thrombosis) ([place]) 1996   right  . Glaucoma    both  eyes  . H/O hiatal hernia   . Hearing impaired person, bilateral    bilateral  . Hemorrhoid   . History of IBS   . History of kidney stones    x 1-passed  . Hyperlipidemia 2012  . Hypertension   . Lumbago   . Lumbar post-laminectomy syndrome   . PONV (postoperative nausea and vomiting)   . Sleep apnea    pt uses cpap - setting of 16  . Ulcer 1966  . Ulcerative colitis ([place])   . Wears glasses   . Wears  hearing aid     Past Surgical History:  Procedure Laterality Date  . APPENDECTOMY  1993  . BACK SURGERY  2008    bone spur removed   . bilateral carpal tunnel release 2009    . cervical fusion 2011--pt has good range of motion neck    . CHOLECYSTECTOMY  1994  . INSERTION OF MESH Bilateral 07/31/2016   Procedure: INSERTION OF MESH;  Surgeon: [name], MD;  Location: [facility];  Service: General;  Laterality: Bilateral;  . JOINT REPLACEMENT Bilateral    2001 left total knee and 1985 right total knee  . MASS EXCISION Left 01/04/2014   Procedure: LEFT MIDDLE FINGER MASS EXCISION WITH NAILBED REPAIR AND ADVANCEMENT FLAP;  Surgeon: [name], MD;  Location: [place];  Service: Orthopedics;  Laterality: Left;  . mass removed from 2nd finger Left aug 2014   squamous carcinoma  . nissen fundoplication 9528    . PROSTATE SURGERY  2014   TURP  . right wrist fusion 12/12 - pt wearing brace on the wrist-not started physical therapy yet Right    fusion prevent limited ROM to right wrist  . SPINE SURGERY  2009   cervical spine fusion  . thyroid nodule     removed  . TOTAL KNEE REVISION Right 11/17/2013   Procedure: RIGHT KNEE POLYETHYLENE REVISION, bone graft;  Surgeon: [name], MD;  Location: [facility];  Service: Orthopedics;  Laterality: Right;  . TOTAL SHOULDER ARTHROPLASTY Right 01/28/2018   Procedure: RIGHT TOTAL SHOULDER ARTHROPLASTY;  Surgeon: [name], MD;  Location: [place];  Service: Orthopedics;  Laterality: Right;  . TOTAL SHOULDER ARTHROPLASTY Left 07/15/2018   Procedure: LEFT SHOULDER ANATOMIC TOTAL SHOULDER ARTHROPLASTY;  Surgeon: [name], MD;  Location: [place];  Service: Orthopedics;  Laterality: Left;  . TRANSURETHRAL RESECTION OF PROSTATE  10/09/2011   Procedure: TRANSURETHRAL RESECTION OF THE PROSTATE WITH GYRUS INSTRUMENTS;  Surgeon: Fredricka Bonine, MD;  Location: [facility];  Service: Urology;  Laterality: N/A;  . uvvvp    . XI ROBOTIC  ASSISTED INGUINAL HERNIA REPAIR WITH MESH Bilateral 07/31/2016   Procedure: XI ROBOTIC BILATERAL INGUINAL HERNIA REPAIR WITH MESH;  Surgeon: [name], MD;  Location: [facility];  Service: General;  Laterality: Bilateral;     Allergies  Allergies  Allergen Reactions  . Adhesive [Tape] Other (See Comments)    Causes blisters - pls use paper tape    Inpatient Medications    . aspirin EC  81 mg Oral QHS  . atorvastatin  40 mg Oral QHS  . diltiazem  15 mg Intravenous Once  . fentaNYL  25 mcg Transdermal Q72H  . guaiFENesin  600 mg Oral BID  . latanoprost  1 drop Both Eyes QHS  . lisinopril  20 mg Oral QHS  . loratadine  10 mg Oral Daily  . olopatadine  1 drop Both Eyes Q1200  . senna  1  tablet Oral BID  . traZODone  100 mg Oral QHS  . vortioxetine HBr  10 mg Oral QHS    Family History    Family History  Problem Relation Age of Onset  . Colon cancer Father   . Bone cancer Father        deceased  . Cancer Father   . Diabetes Father   . Prostate cancer Father   . Arthritis Mother   . Hearing loss Mother   . Hyperlipidemia Mother   . Hypertension Mother   . Deep vein thrombosis Mother   . Diabetes Sister   . Hearing loss Maternal Grandmother   . Diabetes Paternal Grandfather    He indicated that his mother is alive. He indicated that his father is deceased. He indicated that his sister is alive. He indicated that his maternal grandmother is deceased. He indicated that his maternal grandfather is deceased. He indicated that his paternal grandmother is deceased. He indicated that his paternal grandfather is deceased.   Social History    Social History   Socioeconomic History  . Marital status: Single    Spouse name: Not on file  . Number of children: Not on file  . Years of education: Not on file  . Highest education level: Not on file  Occupational History  . Occupation: disabled  Social Needs  . Financial resource strain: Not on file  . Food insecurity:     Worry: Not on file    Inability: Not on file  . Transportation needs:    Medical: Not on file    Non-medical: Not on file  Tobacco Use  . Smoking status: Former Smoker    Packs/day: 1.00    Years: 19.00    Pack years: 19.00    Types: Cigarettes    Last attempt to quit: 08/04/1983    Years since quitting: 34.9  . Smokeless tobacco: Never Used  . Tobacco comment: quit smoking 1985  Substance and Sexual Activity  . Alcohol use: Yes    Alcohol/week: 6.0 standard drinks    Types: 6 Cans of beer per week    Comment: maybe 2 or 3 beers per week  . Drug use: No  . Sexual activity: Not Currently  Lifestyle  . Physical activity:    Days per week: Not on file    Minutes per session: Not on file  . Stress: Not on file  Relationships  . Social connections:    Talks on phone: Not on file    Gets together: Not on file    Attends religious service: Not on file    Active member of club or organization: Not on file    Attends meetings of clubs or organizations: Not on file    Relationship status: Not on file  . Intimate partner violence:    Fear of current or ex partner: Not on file    Emotionally abused: Not on file    Physically abused: Not on file    Forced sexual activity: Not on file  Other Topics Concern  . Not on file  Social History Narrative  . Not on file     Review of Systems    Review of Systems  Constitutional: Positive for diaphoresis and fever. Negative for chills.  HENT: Positive for sore throat. Negative for congestion.   Eyes: Negative for blurred vision and double vision.  Respiratory: Positive for cough and sputum production. Negative for hemoptysis and shortness of breath.   Cardiovascular: Negative for  chest pain, palpitations, orthopnea, leg swelling and PND.  Gastrointestinal: Positive for abdominal pain. Negative for nausea and vomiting.  Musculoskeletal: Negative for myalgias.  Neurological: Negative for dizziness and tingling.  Psychiatric/Behavioral:  Positive for substance abuse (alcohol; previous tobacco use).    Physical Exam    Blood pressure 119/73, pulse 90, temperature 97.9 F (36.6 C), temperature source Oral, resp. rate 17, height 5' 7"  (1.702 m), weight 107.3 kg, SpO2 99 %.  General: 67 y.o. male resting comfortably in no acute distress. Pleasant and cooperative. HEENT: Normal.  Neck: Supple. No carotid bruits. JVD difficult to assess due to body habitus. Lungs: No increased work of breathing. Clear to auscultation bilaterally. No wheezes, rhonchi, or rales. Heart: RRR. Distinct S1 and S2. No murmurs, gallops, or rubs.  Abdomen: Soft, obese, and non-tender to palpation. Bowel sounds present.  Extremities: No lower extremity edema. Wearing compression stockings. Radial and posterior tibial pulses 2+ and equal bilaterally. Neuro: Alert and oriented x3. No focal deficits. Moves all extremities spontaneously. Psych: Normal affect.  Labs    Troponin (Point of Care Test) No results for input(s): TROPIPOC in the last 72 hours. Recent Labs    07/18/18 2106 07/19/18 0323 07/19/18 0802  TROPONINI <0.03 <0.03 <0.03   Lab Results  Component Value Date   WBC 8.0 07/19/2018   HGB 11.8 (L) 07/19/2018   HCT 34.4 (L) 07/19/2018   MCV 91.0 07/19/2018   PLT 177 07/19/2018    Recent Labs  Lab 07/19/18 0323  NA 139  K 4.9  CL 106  CO2 23  BUN 16  CREATININE 1.45*  CALCIUM 8.9  PROT 6.3*  BILITOT 1.0  ALKPHOS 50  ALT 27  AST 27  GLUCOSE 221*   Lab Results  Component Value Date   CHOL 115 07/19/2018   HDL 38 (L) 07/19/2018   LDLCALC 67 07/19/2018   TRIG 48 07/19/2018   No results found for: [facility]   Radiology Studies    Dg Chest Port 1 View  Result Date: 07/18/2018 CLINICAL DATA:  Atrial fibrillation EXAM: PORTABLE CHEST 1 VIEW COMPARISON:  08/30/2017 FINDINGS: The heart size and mediastinal contours are within normal limits. Both lungs are clear. The visualized skeletal structures show prior bilateral  shoulder replacement and cervical spine fusion IMPRESSION: No active disease. Electronically Signed   By: Inez Catalina M.D.   On: 07/18/2018 21:53   Dg Shoulder Left Port  Result Date: 07/15/2018 CLINICAL DATA:  Left shoulder replacement. EXAM: LEFT SHOULDER - 1 VIEW COMPARISON:  None. FINDINGS: Left total shoulder arthroplasty. Components are well aligned. No acute fracture or dislocation. The acromioclavicular joint space is preserved. Expected postsurgical changes in the soft tissues about the shoulder. IMPRESSION: 1. Left total shoulder arthroplasty without evidence of acute postoperative complication. Electronically Signed   By: Titus Dubin M.D.   On: 07/15/2018 16:42    EKG     EKG: EKG was personally reviewed and demonstrates: Atrial fibrillation with ventricular rate of 112 bpm but no acute ischemic changes.   Telemetry: Telemetry was personally reviewed and demonstrates: Sinus rhythm with heart rate in the 70's to low 100's.  Cardiac Imaging    Echo pending.  Assessment & Plan    New Onset Atrial Fibrillation with RVR - Initial EKG showed atrial fibrillation with ventricular rate of 112 bpm. Patient spontaneously converted back to sinus rhythm in the ED before Cardizem drip could be started.  - Patient currently in sinus rhythm on telemetry.  - Troponin negative x3. -  Echo pending. - TSH normal. - Potassium 4.9. - Magnesium 1.9. - Heart rate is currently well controlled but could consider adding low-dose beta blocker.  - CHADSVASC = 3 (HTN, HLD, age). Currently on anticoagulation with Heparin Consider transitioning to NOAC if OK with Ortho. - Patient reports episodes of diaphoresis once or twice a week with no other cardiac symptoms. Consider Lexiscan to rule out ischemia. Also, consider outpatient monitor.   Hypertension - BP currently well controlled. Most recent BP 119/73.  - Continue Lisinopril 22m daily.   Hyperlipidemia - Continue Lipitor 868mdaily.    Signed, CaDarreld McleanPA-C 07/19/2018, 11:33 AM  For questions or updates, please contact   Please consult [email or web address] for contact info under Cardiology/STEMI.

## 2018-07-19 NOTE — ED Notes (Signed)
ED TO INPATIENT HANDOFF REPORT  Name/Age/Gender [name] 67 y.o. male  Code Status    Code Status Orders  (From admission, onward)         Start     Ordered   07/18/18 2310  Full code  Continuous     07/18/18 2309        Code Status History    Date Active Date Inactive Code Status Order ID Comments User Context   07/15/2018 1833 07/16/2018 1408 Full Code [id number]  [name], MD Inpatient   01/28/2018 1251 01/29/2018 1415 Full Code [id number]  [name], MD Inpatient   07/31/2016 1725 08/01/2016 1247 Full Code [id number]  [name], MD Inpatient   11/17/2013 1020 11/18/2013 1358 Full Code [id number]  [name], MD Inpatient      Home/SNF/Other Home  Chief Complaint swelling   Level of Care/Admitting Diagnosis ED Disposition    ED Disposition Condition Ward: [place] [[id number]]  Level of Care: Progressive [102]  I expect the patient will be discharged within 24 hours: No (not a candidate for 5C-Observation unit)  Diagnosis: Atrial fibrillation with RVR [facility]) [[id number]]  Admitting Physician: Toy Baker [[id number]]  Attending Physician: Toy Baker [[id number]]  PT Class (Do Not Modify): Observation [104]  PT Acc Code (Do Not Modify): Observation [10022]       Medical History Past Medical History:  Diagnosis Date  . Arthritis    oa and ddd  . Back pain, chronic    pt states he has 3 herniated disks--uses fentanyl patch for pain  . Blood transfusion without reported diagnosis 1950   rH negative"at birth only"  . BPH (benign prostatic hyperplasia)    frequent urination, not able to hold urine well  . 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 Cataracts, both eyes    worst on left  . Colon polyps   . Complication of anesthesia    pt had spinal for a knee replacement--"woke up" during surgery--and sick after surgery  . Depression   . Diverticulosis   . DVT (deep venous  thrombosis) ([place]) 2001 or 2002   right leg-required hospitalization x 8 days  . DVT (deep venous thrombosis) ([place]) 1996   right  . Glaucoma    both eyes  . H/O hiatal hernia   . Hearing impaired person, bilateral    bilateral  . Hemorrhoid   . History of IBS   . History of kidney stones    x 1-passed  . Hyperlipidemia 2012  . Hypertension   . Lumbago   . Lumbar post-laminectomy syndrome   . PONV (postoperative nausea and vomiting)   . Sleep apnea    pt uses cpap - setting of 16  . Ulcer 1966  . Ulcerative colitis ([place])   . Wears glasses   . Wears hearing aid     Allergies Allergies  Allergen Reactions  . Adhesive [Tape] Other (See Comments)    Causes blisters - pls use paper tape    IV Location/Drains/Wounds Patient Lines/Drains/Airways Status   Active Line/Drains/Airways    Name:   Placement date:   Placement time:   Site:   Days:   Peripheral IV 07/18/18 Left Antecubital   07/18/18    1637    Antecubital   1   Peripheral IV 07/18/18 Right;Posterior Wrist   07/18/18    2242    Wrist   1   Incision (  Closed) 07/15/18 Shoulder Left   07/15/18    1454     4          Labs/Imaging Results for orders placed or performed during the hospital encounter of 07/18/18 (from the past 48 hour(s))  CBC with Differential     Status: Abnormal   Collection Time: 07/18/18  4:45 PM  Result Value Ref Range   WBC 8.2 4.0 - 10.5 K/uL   RBC 4.40 4.22 - 5.81 MIL/uL   Hemoglobin 13.7 13.0 - 17.0 g/dL   HCT 40.3 39.0 - 52.0 %   MCV 91.6 80.0 - 100.0 fL   MCH 31.1 26.0 - 34.0 pg   MCHC 34.0 30.0 - 36.0 g/dL   RDW 12.0 11.5 - 15.5 %   Platelets 180 150 - 400 K/uL   nRBC 0.0 0.0 - 0.2 %   Neutrophils Relative % 59 %   Neutro Abs 4.9 1.7 - 7.7 K/uL   Lymphocytes Relative 18 %   Lymphs Abs 1.5 0.7 - 4.0 K/uL   Monocytes Relative 17 %   Monocytes Absolute 1.4 (H) 0.1 - 1.0 K/uL   Eosinophils Relative 4 %   Eosinophils Absolute 0.4 0.0 - 0.5 K/uL   Basophils Relative 1 %   Basophils  Absolute 0.0 0.0 - 0.1 K/uL   Immature Granulocytes 1 %   Abs Immature Granulocytes 0.04 0.00 - 0.07 K/uL    Comment: Performed at [facility], [address], [place], [place] [address]  Basic metabolic panel     Status: Abnormal   Collection Time: 07/18/18  4:45 PM  Result Value Ref Range   Sodium 140 135 - 145 mmol/L   Potassium 3.7 3.5 - 5.1 mmol/L   Chloride 103 98 - 111 mmol/L   CO2 26 22 - 32 mmol/L   Glucose, Bld 134 (H) 70 - 99 mg/dL   BUN 13 8 - 23 mg/dL   Creatinine, Ser 1.01 0.61 - 1.24 mg/dL   Calcium 9.1 8.9 - 10.3 mg/dL   GFR calc non Af Amer >60 >60 mL/min   GFR calc Af Amer >60 >60 mL/min   Anion gap 11 5 - 15    Comment: Performed at [facility] [address], [place], [place] [address]  Troponin I - Now Then Q6H     Status: None   Collection Time: 07/18/18  9:06 PM  Result Value Ref Range   Troponin I <0.03 <0.03 ng/mL    Comment: Performed at [facility] [address], [place], [place] [address]  Lactic acid, plasma     Status: None   Collection Time: 07/18/18  9:06 PM  Result Value Ref Range   Lactic Acid, Venous 1.4 0.5 - 1.9 mmol/L    Comment: Performed at [facility] [address] [place], [place] [address]  I-Stat venous blood gas, ED     Status: Abnormal   Collection Time: 07/18/18  9:17 PM  Result Value Ref Range   pH, Ven 7.343 7.250 - 7.430   pCO2, Ven 47.2 44.0 - 60.0 mmHg   pO2, Ven 28.0 (LL) 32.0 - 45.0 mmHg   Bicarbonate 25.7 20.0 - 28.0 mmol/L   TCO2 27 22 - 32 mmol/L   O2 Saturation 49.0 %   Acid-base deficit 1.0 0.0 - 2.0 mmol/L   Patient temperature HIDE    Sample type VENOUS    Comment NOTIFIED PHYSICIAN   Urinalysis, Routine w reflex microscopic  Status: Abnormal   Collection Time: 07/18/18 11:07 PM  Result Value Ref Range   Color, Urine YELLOW YELLOW   APPearance CLEAR CLEAR   Specific Gravity, Urine 1.015 1.005 - 1.030   pH 5.0 5.0 - 8.0   Glucose, UA 50 (A) NEGATIVE mg/dL   Hgb urine  dipstick NEGATIVE NEGATIVE   Bilirubin Urine NEGATIVE NEGATIVE   Ketones, ur 20 (A) NEGATIVE mg/dL   Protein, ur NEGATIVE NEGATIVE mg/dL   Nitrite NEGATIVE NEGATIVE   Leukocytes, UA NEGATIVE NEGATIVE    Comment: Performed at [facility] [address], [place], [place] [address]  Troponin I - Now Then Q6H     Status: None   Collection Time: 07/19/18  3:23 AM  Result Value Ref Range   Troponin I <0.03 <0.03 ng/mL    Comment: Performed at [facility] [address] [place], [place] [address]  Heparin level (unfractionated)     Status: Abnormal   Collection Time: 07/19/18  3:23 AM  Result Value Ref Range   Heparin Unfractionated 0.11 (L) 0.30 - 0.70 IU/mL    Comment: (NOTE) If heparin results are below expected values, and patient dosage has  been confirmed, suggest follow up testing of antithrombin III levels. Performed at [facility], [place] [address], [place], [place] [address]   TSH     Status: None   Collection Time: 07/19/18  3:23 AM  Result Value Ref Range   TSH 0.830 0.350 - 4.500 uIU/mL    Comment: Performed by a 3rd Generation assay with a functional sensitivity of <=0.01 uIU/mL. Performed at [facility], [place] [address], [place], [place] [address]   CBC     Status: Abnormal   Collection Time: 07/19/18  3:23 AM  Result Value Ref Range   WBC 5.2 4.0 - 10.5 K/uL   RBC 4.05 (L) 4.22 - 5.81 MIL/uL   Hemoglobin 12.6 (L) 13.0 - 17.0 g/dL   HCT 37.7 (L) 39.0 - 52.0 %   MCV 93.1 80.0 - 100.0 fL   MCH 31.1 26.0 - 34.0 pg   MCHC 33.4 30.0 - 36.0 g/dL   RDW 12.0 11.5 - 15.5 %   Platelets 162 150 - 400 K/uL   nRBC 0.0 0.0 - 0.2 %    Comment: Performed at [facility], [place] [address] [place], [place] [address]  Hemoglobin A1c     Status: None   Collection Time: 07/19/18  3:23 AM  Result Value Ref Range   Hgb A1c MFr Bld 5.1 4.8 - 5.6 %    Comment: (NOTE) Pre diabetes:          5.7%-6.4% Diabetes:              >6.4% Glycemic control for    <7.0% adults with diabetes    Mean Plasma Glucose 99.67 mg/dL    Comment: Performed at [facility] [address], [place], [place] [address]  Lipid panel     Status: Abnormal   Collection Time: 07/19/18  3:23 AM  Result Value Ref Range   Cholesterol 115 0 - 200 mg/dL   Triglycerides 48 <150 mg/dL   HDL 38 (L) >40 mg/dL   Total CHOL/HDL Ratio 3.0 RATIO   VLDL 10 0 - 40 mg/dL   LDL Cholesterol 67 0 - 99 mg/dL    Comment:        Total Cholesterol/HDL:CHD Risk Coronary Heart Disease Risk Table  Men   Women  1/2 Average Risk   3.4   3.3  Average Risk       5.0   4.4  2 X Average Risk   9.6   7.1  3 X Average Risk  23.4   11.0        Use the calculated Patient Ratio above and the CHD Risk Table to determine the patient's CHD Risk.        ATP III CLASSIFICATION (LDL):  <100     mg/dL   Optimal  100-129  mg/dL   Near or Above                    Optimal  130-159  mg/dL   Borderline  160-189  mg/dL   High  >190     mg/dL   Very High Performed at [facility] [address], [place], [place] [address]    Dg Chest Port 1 View  Result Date: 07/18/2018 CLINICAL DATA:  Atrial fibrillation EXAM: PORTABLE CHEST 1 VIEW COMPARISON:  08/30/2017 FINDINGS: The heart size and mediastinal contours are within normal limits. Both lungs are clear. The visualized skeletal structures show prior bilateral shoulder replacement and cervical spine fusion IMPRESSION: No active disease. Electronically Signed   By: Inez Catalina M.D.   On: 07/18/2018 21:53   None  Pending Labs Unresulted Labs (From admission, onward)    Start     Ordered   07/20/18 0500  Heparin level (unfractionated)  Daily,   R     07/18/18 2106   07/19/18 1200  Heparin level (unfractionated)  Once-Timed,   R     07/19/18 0421   07/19/18 0501  HIV antibody (Routine Testing)  Tomorrow morning,   R     07/19/18 0327   07/19/18 0500  Magnesium  Tomorrow morning,   R    Comments:  Call MD if <1.5     07/18/18 2309   07/19/18 0500  Phosphorus  Tomorrow morning,   R     07/18/18 2309   07/19/18 0500  Comprehensive metabolic panel  Once,   R    Comments:  Cal MD for K<3.5 or >5.0    07/18/18 2309   07/19/18 0500  CBC  Daily,   R     07/18/18 2110   07/18/18 2051  Respiratory Panel by PCR  (Respiratory virus panel with precautions)  Once,   R     07/18/18 2050   07/18/18 2049  Troponin I - Now Then Q6H  Now then every 6 hours,   R     07/18/18 2048   07/18/18 2049  Blood gas, venous  Once,   R     07/18/18 2048   07/18/18 2022  Urine Culture  Once,   R     07/18/18 2021          Vitals/Pain Today's Vitals   07/19/18 0258 07/19/18 0302 07/19/18 0315 07/19/18 0400  BP:  114/71  103/70  Pulse: 69 86 75 66  Resp: 14 19 13 13   Temp:      TempSrc:      SpO2: 92% 94% 94% 95%  Weight:      Height:      PainSc:        Isolation Precautions Droplet precaution  Medications Medications  diltiazem (CARDIZEM) 1 mg/mL load via infusion 15 mg (15 mg Intravenous Not Given 07/18/18 2244)  diltiazem (CARDIZEM) 100 mg in dextrose  5 % 100 mL (1 mg/mL) infusion (5 mg/hr Intravenous Not Given 07/18/18 2245)  aspirin EC tablet 81 mg (81 mg Oral Given 07/19/18 0012)  fentaNYL (DURAGESIC - dosed mcg/hr) patch 25 mcg (25 mcg Transdermal Patch Applied 07/19/18 0012)  oxyCODONE-acetaminophen (PERCOCET/ROXICET) 5-325 MG per tablet 1 tablet (has no administration in time range)  atorvastatin (LIPITOR) tablet 40 mg (40 mg Oral Given 07/19/18 0011)  lisinopril (PRINIVIL,ZESTRIL) tablet 20 mg (has no administration in time range)  traZODone (DESYREL) tablet 100 mg (100 mg Oral Given 07/19/18 0011)  vortioxetine HBr (TRINTELLIX) tablet 10 mg (10 mg Oral Given 07/19/18 0151)  loratadine (CLARITIN) tablet 10 mg (has no administration in time range)  latanoprost (XALATAN) 0.005 % ophthalmic solution 1 drop (1 drop Both Eyes Not Given 07/19/18 0200)  Olopatadine HCl 0.7 % SOLN 1 drop (has no  administration in time range)  acetaminophen (TYLENOL) tablet 650 mg (has no administration in time range)    Or  acetaminophen (TYLENOL) suppository 650 mg (has no administration in time range)  ondansetron (ZOFRAN) tablet 4 mg (has no administration in time range)    Or  ondansetron (ZOFRAN) injection 4 mg (has no administration in time range)  0.9 %  sodium chloride infusion ( Intravenous New Bag/Given 07/19/18 0017)  senna (SENOKOT) tablet 8.6 mg (8.6 mg Oral Given 07/19/18 0011)  polyethylene glycol (MIRALAX / GLYCOLAX) packet 17 g (has no administration in time range)  bisacodyl (DULCOLAX) suppository 10 mg (has no administration in time range)  levalbuterol (XOPENEX) nebulizer solution 0.63 mg (has no administration in time range)  guaiFENesin (MUCINEX) 12 hr tablet 600 mg (600 mg Oral Given 07/19/18 0011)  heparin ADULT infusion 100 units/mL (25000 units/285m sodium chloride 0.45%) (1,500 Units/hr Intravenous Rate/Dose Change 07/19/18 0424)  sodium chloride 0.9 % bolus 500 mL (0 mLs Intravenous Stopped 07/18/18 1713)  doxycycline (VIBRAMYCIN) 100 mg in sodium chloride 0.9 % 250 mL IVPB (0 mg Intravenous Stopped 07/18/18 1859)  methylPREDNISolone sodium succinate (SOLU-MEDROL) 125 mg/2 mL injection 125 mg (125 mg Intravenous Given 07/18/18 1640)  diphenhydrAMINE (BENADRYL) injection 12.5 mg (12.5 mg Intravenous Given 07/18/18 1640)  famotidine (PEPCID) IVPB 20 mg premix (0 mg Intravenous Stopped 07/18/18 1712)  oxyCODONE-acetaminophen (PERCOCET/ROXICET) 5-325 MG per tablet 1 tablet (1 tablet Oral Given 07/18/18 1934)  sodium chloride 0.9 % bolus 500 mL (0 mLs Intravenous Stopped 07/18/18 2206)    Mobility walks

## 2018-07-19 NOTE — Progress Notes (Signed)
[facility] TEAM 1 - Stepdown/ICU TEAM  [name]  IPJ:[id number] DOB: April 17, 1951 DOA: 07/18/2018 PCP: [name], MD    Brief Narrative:  67yo M w/ a hx of chronic back pain, HTN,  BPH, depression, DVT, glaucoma, hyperlipidemia, kidney stones, sleep apnea on CPAP, and ulcerative colitis who presented with penile and scrotal pain and swelling. He was recently hospitalized for a L shoulder arthroplasty and discharged on 14 December.  While hospitalized patient noted some redness and swelling of the penis related to a condom cath, and this worsened after he went home.   Subjective: The patient is resting comfortably in bed at the time of my exam.  He denies chest pain nausea vomiting or abdominal pain.  The irritation in his penis and scrotum has improved significantly.  He reports a market decrease in the swelling of his penis and scrotum.  Assessment & Plan:  Newly diagnosed Afib w/ RVR Spontaneously converted back to NSR - Cards has seen - to go home w/ a cardiac monitor for outpt f/u - TSH normal -to be transitioned to a DOAC prior to discharge  Allergic reaction of penis / scrotum Due to use of condom cath versus antiseptic used prior to placement of catheter -swelling resolved -moderate erythema persists -patient feels this is much improved  AKI crt has climbed since admit -recheck in a.m.  HTN Well-controlled at present  OSA Cont home CPAP regimen  HLD Continue home medical regimen  GERD  Recent L shoulder surgery Appears stable presently  DVT prophylaxis: heparin gtt Code Status: FULL CODE Family Communication: Spoke with wife and daughter at bedside Disposition Plan: Safe for transition to telemetry bed -probable discharge home 12/18 if remains in normal sinus rhythm  Consultants:  Cardiology   Antimicrobials:  none   Objective: Blood pressure 127/80, pulse 79, temperature 97.7 F (36.5 C), temperature source Oral, resp. rate (!) 24, height 5' 7"   (1.702 m), weight 107.3 kg, SpO2 100 %.  Intake/Output Summary (Last 24 hours) at 07/19/2018 1658 Last data filed at 07/19/2018 1647 Gross per 24 hour  Intake 1639.47 ml  Output 1130 ml  Net 509.47 ml   Filed Weights   07/18/18 1659 07/19/18 0544  Weight: 104.3 kg 107.3 kg    Examination: General: No acute respiratory distress Lungs: Clear to auscultation bilaterally without wheezes or crackles Cardiovascular: Regular rate and rhythm without murmur gallop or rub normal S1 and S2 Abdomen: Nontender, nondistended, soft, bowel sounds positive, no rebound, no ascites, no appreciable mass Extremities: No significant cyanosis, clubbing, or edema bilateral lower extremities GU: Diffuse erythema of penis and scrotum appreciable with no significant cutaneous lesions or edema  CBC: Recent Labs  Lab 07/18/18 1645 07/19/18 0323 07/19/18 0802  WBC 8.2 5.2 8.0  NEUTROABS 4.9  --   --   HGB 13.7 12.6* 11.8*  HCT 40.3 37.7* 34.4*  MCV 91.6 93.1 91.0  PLT 180 162 734   Basic Metabolic Panel: Recent Labs  Lab 07/16/18 0318 07/18/18 1645 07/19/18 0323 07/19/18 0802  NA 138 140 139  --   K 4.6 3.7 4.9  --   CL 105 103 106  --   CO2 22 26 23   --   GLUCOSE 137* 134* 221*  --   BUN 16 13 16   --   CREATININE 1.17 1.01 1.45*  --   CALCIUM 8.6* 9.1 8.9  --   MG  --   --   --  1.9  PHOS  --   --   --  2.1*   GFR: Estimated Creatinine Clearance: 56.2 mL/min (A) (by C-G formula based on SCr of 1.45 mg/dL (H)).  Liver Function Tests: Recent Labs  Lab 07/19/18 0323  AST 27  ALT 27  ALKPHOS 50  BILITOT 1.0  PROT 6.3*  ALBUMIN 3.1*    Cardiac Enzymes: Recent Labs  Lab 07/18/18 2106 07/19/18 0323 07/19/18 0802  TROPONINI <0.03 <0.03 <0.03    HbA1C: Hgb A1c MFr Bld  Date/Time Value Ref Range Status  07/19/2018 03:23 AM 5.1 4.8 - 5.6 % Final    Comment:    (NOTE) Pre diabetes:          5.7%-6.4% Diabetes:              >6.4% Glycemic control for   <7.0% adults with  diabetes      Recent Results (from the past 240 hour(s))  MRSA PCR Screening     Status: None   Collection Time: 07/15/18  7:59 PM  Result Value Ref Range Status   MRSA by PCR NEGATIVE NEGATIVE Final    Comment:        The GeneXpert MRSA Assay (FDA approved for NASAL specimens only), is one component of a comprehensive MRSA colonization surveillance program. It is not intended to diagnose MRSA infection nor to guide or monitor treatment for MRSA infections. Performed at [facility], [place] [address] [place], [place] [address]   Respiratory Panel by PCR     Status: None   Collection Time: 07/19/18 12:20 AM  Result Value Ref Range Status   Adenovirus NOT DETECTED NOT DETECTED Final   Coronavirus 229E NOT DETECTED NOT DETECTED Final   Coronavirus HKU1 NOT DETECTED NOT DETECTED Final   Coronavirus NL63 NOT DETECTED NOT DETECTED Final   Coronavirus OC43 NOT DETECTED NOT DETECTED Final   Metapneumovirus NOT DETECTED NOT DETECTED Final   Rhinovirus / Enterovirus NOT DETECTED NOT DETECTED Final   Influenza A NOT DETECTED NOT DETECTED Final   Influenza B NOT DETECTED NOT DETECTED Final   Parainfluenza Virus 1 NOT DETECTED NOT DETECTED Final   Parainfluenza Virus 2 NOT DETECTED NOT DETECTED Final   Parainfluenza Virus 3 NOT DETECTED NOT DETECTED Final   Parainfluenza Virus 4 NOT DETECTED NOT DETECTED Final   Respiratory Syncytial Virus NOT DETECTED NOT DETECTED Final   Bordetella pertussis NOT DETECTED NOT DETECTED Final   Chlamydophila pneumoniae NOT DETECTED NOT DETECTED Final   Mycoplasma pneumoniae NOT DETECTED NOT DETECTED Final    Comment: Performed at [facility], [place] [address], [place],  [address]     Scheduled Meds: . aspirin EC  81 mg Oral QHS  . atorvastatin  40 mg Oral QHS  . diltiazem  15 mg Intravenous Once  . fentaNYL  25 mcg Transdermal Q72H  . guaiFENesin  600 mg Oral BID  . latanoprost  1 drop Both Eyes QHS  . lisinopril  20 mg Oral QHS   . loratadine  10 mg Oral Daily  . olopatadine  1 drop Both Eyes Q1200  . senna  1 tablet Oral BID  . traZODone  100 mg Oral QHS  . vortioxetine HBr  10 mg Oral QHS   Continuous Infusions: . diltiazem (CARDIZEM) infusion    . heparin 1,700 Units/hr (07/19/18 1647)     LOS: 0 days   Cherene Altes, MD [facility] Office  [phone] Pager - Text Page per Amion  If 7PM-7AM, please contact night-coverage per Amion 07/19/2018, 4:58 PM

## 2018-07-19 NOTE — Progress Notes (Signed)
Attempted to give report to floor RN X 1

## 2018-07-19 NOTE — Progress Notes (Signed)
Pt wearing nasal mask with CPAP and tolerating well.

## 2018-07-19 NOTE — Progress Notes (Signed)
[facility] for Heparin Indication: atrial fibrillation  Allergies  Allergen Reactions  . Adhesive [Tape] Other (See Comments)    Causes blisters - pls use paper tape    Patient Measurements: Height: 5' 7"  (170.2 cm) Weight: 230 lb (104.3 kg) IBW/kg (Calculated) : 66.1 Heparin Dosing Weight: 89.1 kg  Vital Signs: Temp Source: Oral (12/16 2008) BP: 114/71 (12/17 0302) Pulse Rate: 86 (12/17 0302)  Labs: Recent Labs    07/18/18 1645 07/18/18 2106 07/19/18 0323  HGB 13.7  --  12.6*  HCT 40.3  --  37.7*  PLT 180  --  162  HEPARINUNFRC  --   --  0.11*  CREATININE 1.01  --   --   TROPONINI  --  <0.03  --     Estimated Creatinine Clearance: 79.5 mL/min (by C-G formula based on SCr of 1.01 mg/dL).  Assessment: 32 YOM who presented to the [facility] on 12/16 with scrotal pain/swelling after requiring a condom cath for L-shoulder surgery on 12/14. Work up also revealed Afib with RVR and pharmacy was consulted to start Heparin for anticoagulation - no bolus requested per MD due to the recent shoulder surgery.  Baseline CBC wnl Heparin level 0.11 units/ml   Goal of Therapy:  Heparin level 0.3-0.7 units/ml Monitor platelets by anticoagulation protocol: Yes   Plan:  - Increase Heparin to 1500 units/hr -Check heparin in ~ 6 hours - Daily HL, CBC - Will continue to monitor for any signs/symptoms of bleeding   Thank you for allowing pharmacy to be a part of this patient's care.  Excell Seltzer, PharmD Clinical Pharmacist  07/19/2018 4:16 AM

## 2018-07-19 NOTE — Progress Notes (Signed)
  Echocardiogram 2D Echocardiogram has been performed.  [name] G [name] 07/19/2018, 12:02 PM

## 2018-07-19 NOTE — ED Notes (Signed)
Pt personal fentanyl patch was removed and discarded in bin

## 2018-07-19 NOTE — Progress Notes (Signed)
RT set up CPAP and placed on patient. Patient tolerating well at this time. RT will monitor as needed.

## 2018-07-20 DIAGNOSIS — I10 Essential (primary) hypertension: Secondary | ICD-10-CM | POA: Diagnosis not present

## 2018-07-20 DIAGNOSIS — T7840XA Allergy, unspecified, initial encounter: Secondary | ICD-10-CM | POA: Diagnosis not present

## 2018-07-20 DIAGNOSIS — N492 Inflammatory disorders of scrotum: Secondary | ICD-10-CM | POA: Diagnosis not present

## 2018-07-20 DIAGNOSIS — I4891 Unspecified atrial fibrillation: Secondary | ICD-10-CM | POA: Diagnosis not present

## 2018-07-20 LAB — BASIC METABOLIC PANEL
ANION GAP: 9 (ref 5–15)
BUN: 18 mg/dL (ref 8–23)
CALCIUM: 9 mg/dL (ref 8.9–10.3)
CO2: 26 mmol/L (ref 22–32)
Chloride: 106 mmol/L (ref 98–111)
Creatinine, Ser: 1.05 mg/dL (ref 0.61–1.24)
GFR calc non Af Amer: >60 mL/min (ref >60)
Glucose, Bld: 122 mg/dL — ABNORMAL HIGH (ref 70–99)
Potassium: 4 mmol/L (ref 3.5–5.1)
Sodium: 141 mmol/L (ref 135–145)

## 2018-07-20 LAB — CBC
HCT: 33.8 % — ABNORMAL LOW (ref 39.0–52.0)
Hemoglobin: 11.5 g/dL — ABNORMAL LOW (ref 13.0–17.0)
MCH: 31.3 pg (ref 26.0–34.0)
MCHC: 34 g/dL (ref 30.0–36.0)
MCV: 91.8 fL (ref 80.0–100.0)
Platelets: 181 10*3/uL (ref 150–400)
RBC: 3.68 MIL/uL — AB (ref 4.22–5.81)
RDW: 12.2 % (ref 11.5–15.5)
WBC: 8.3 10*3/uL (ref 4.0–10.5)
nRBC: 0 % (ref 0.0–0.2)

## 2018-07-20 LAB — URINE CULTURE: Culture: <10000 — AB

## 2018-07-20 MED ORDER — RIVAROXABAN 20 MG PO TABS: 20.0000 mg | ORAL_TABLET | Freq: Every day | ORAL | 0 refills | Status: DC

## 2018-07-20 MED FILL — XARELTO 20 MG TABLET: 20 | 30 days supply | Qty: 30 | Fill #0

## 2018-07-20 NOTE — Care Management (Signed)
#    4.   S/W JAY @ [facility] # [phone]   1. XARELTO 20 MG  DAILY COVER- YES CO-PAY- $ 135.00 TIER- 3 DRUG PRIOR APPROVAL- NO  2. XARELTO  15 G BID COVER- YES CO-PAY- $ 135.00 TIER- 3 DRUG PRIOR APPROVAL- NO  NO DEDUCTIBLE  NO OUT-OF-POCKET  PREFERRED  PHARMACY : YES   [facility]  AND [facility]

## 2018-07-20 NOTE — Discharge Summary (Signed)
Physician Discharge Summary  [name] IPJ:[id number] DOB: April 07, 1951 DOA: 07/18/2018  PCP: [name], MD  Admit date: 07/18/2018 Discharge date: 07/21/2018  Admitted From: Home Disposition:  Home  Recommendations for Outpatient Follow-up:  1. Follow up with PCP in 1-2 weeks 2. Follow up with Cardiology as scheduled  Discharge Condition:Stable CODE STATUS:Full Diet recommendation: Heart healthy   Brief/Interim Summary: 67yo M w/ a hx of chronic back pain,HTN,BPH, depression, DVT, glaucoma, hyperlipidemia, kidney stones, sleep apnea on CPAP, and ulcerative colitis who presented withpenile and scrotal pain and swelling. He was recently hospitalized for a L shoulder arthroplasty and discharged on 14 December. While hospitalized patient noted some redness and swelling of the penis related to a condom cath, and this worsened after he went home  Discharge Diagnoses:  Active Problems:   GERD   OSA (obstructive sleep apnea)   H/O total shoulder replacement, left   Cellulitis   Essential hypertension   Hyperlipidemia   Allergic reaction   Atrial fibrillation with RVR (HCC)  Newly diagnosed Afib w/ RVR Spontaneously converted back to NSR - Cards has seen - to go home w/ a cardiac monitor for outpt f/u - TSH normal - Patient transitioned to a DOAC on discharge  Allergic reaction of penis / scrotum Due to use of condom cath versus antiseptic used prior to placement of catheter -swelling resolved -moderate erythema persists -patient feels this is much improved  AKI crt has climbed since admit, now improved  HTN Well-controlled at present  OSA Cont home CPAP regimen  HLD Continue home medical regimen  GERD  Recent L shoulder surgery Appears stable presently   Discharge Instructions   Allergies as of 07/20/2018      Reactions   Adhesive [tape] Other (See Comments)   Causes blisters - pls use paper tape      Medication List    STOP taking  these medications   aspirin EC 81 MG tablet   lisinopril 20 MG tablet Commonly known as:  PRINIVIL,ZESTRIL   methocarbamol 500 MG tablet Commonly known as:  ROBAXIN     TAKE these medications   acetaminophen 500 MG tablet Commonly known as:  TYLENOL Take 1,000 mg by mouth every 6 (six) hours as needed for headache (pain).   albuterol 108 (90 Base) MCG/ACT inhaler Commonly known as:  PROVENTIL HFA;VENTOLIN HFA Inhale 2 puffs into the lungs every 4 (four) hours as needed for wheezing or shortness of breath.   atorvastatin 40 MG tablet Commonly known as:  LIPITOR TAKE 1 TABLET BY MOUTH ONCE DAILY What changed:  when to take this   CALCIUM + D PO Take 1 tablet by mouth at bedtime.   cetirizine 10 MG tablet Commonly known as:  ZYRTEC Take 10 mg by mouth at bedtime.   fentaNYL 25 MCG/HR patch Commonly known as:  DURAGESIC - dosed mcg/hr Place 25 mcg onto the skin every 3 (three) days.   Fish Oil 500 MG Caps Take 500 mg by mouth at bedtime.   fluticasone 50 MCG/ACT nasal spray Commonly known as:  FLONASE Place 1 spray into both nostrils at bedtime as needed for allergies or rhinitis.   magnesium oxide 400 MG tablet Commonly known as:  MAG-OX Take 400 mg by mouth at bedtime.   multivitamin with minerals Tabs tablet Take 1 tablet by mouth at bedtime.   oxyCODONE-acetaminophen 5-325 MG tablet Commonly known as:  PERCOCET Take 1-2 tablets by mouth every 4 (four) hours as needed for moderate pain  or severe pain. What changed:  how much to take   PAZEO 0.7 % Soln Generic drug:  Olopatadine HCl Place 1 drop into both eyes daily at 12 noon.   PRESCRIPTION MEDICATION Inhale into the lungs at bedtime. CPAP   rivaroxaban 20 MG Tabs tablet Commonly known as:  XARELTO Take 1 tablet (20 mg total) by mouth daily with supper.   traZODone 50 MG tablet Commonly known as:  DESYREL Take 100 mg by mouth at bedtime.   vortioxetine HBr 10 MG Tabs tablet Commonly known as:   TRINTELLIX Take 1 tablet (10 mg total) by mouth daily. What changed:  when to take this   XELPROS 0.005 % Emul Generic drug:  Latanoprost Place 1 drop into both eyes at bedtime.      Follow-up Information    [facility] Follow up.   Specialty:  Cardiology Why:  You have an appointment scheduled on 08/02/2018 at 8:30am to get your cardiac monitor set up. Contact information: [address] [place]       [name], MD Follow up.   Specialty:  Cardiology Why:  You have a follow-up visit scheduled for 08/25/2018 at 8:40am.  Contact information: [address] [place] [address] [phone]          Allergies  Allergen Reactions  . Adhesive [Tape] Other (See Comments)    Causes blisters - pls use paper tape    Consultations:  Cardiology  Procedures/Studies: Dg Chest Port 1 View  Result Date: 07/18/2018 CLINICAL DATA:  Atrial fibrillation EXAM: PORTABLE CHEST 1 VIEW COMPARISON:  08/30/2017 FINDINGS: The heart size and mediastinal contours are within normal limits. Both lungs are clear. The visualized skeletal structures show prior bilateral shoulder replacement and cervical spine fusion IMPRESSION: No active disease. Electronically Signed   By: Inez Catalina M.D.   On: 07/18/2018 21:53   Dg Shoulder Left Port  Result Date: 07/15/2018 CLINICAL DATA:  Left shoulder replacement. EXAM: LEFT SHOULDER - 1 VIEW COMPARISON:  None. FINDINGS: Left total shoulder arthroplasty. Components are well aligned. No acute fracture or dislocation. The acromioclavicular joint space is preserved. Expected postsurgical changes in the soft tissues about the shoulder. IMPRESSION: 1. Left total shoulder arthroplasty without evidence of acute postoperative complication. Electronically Signed   By: Titus Dubin M.D.   On: 07/15/2018 16:42    Subjective: Eager to go home  Discharge  Exam: Vitals:   07/20/18 0527 07/20/18 0741  BP: 115/74 (!) 141/84  Pulse: 64 70  Resp: 16   Temp: 98.5 F (36.9 C) 98.5 F (36.9 C)  SpO2: 95% 96%   Vitals:   07/19/18 1645 07/19/18 2122 07/20/18 0527 07/20/18 0741  BP: 127/80 126/78 115/74 (!) 141/84  Pulse: 79 75 64 70  Resp: (!) 24 18 16    Temp: 97.7 F (36.5 C) 98.8 F (37.1 C) 98.5 F (36.9 C) 98.5 F (36.9 C)  TempSrc: Oral Oral Oral Oral  SpO2: 100% 95% 95% 96%  Weight:      Height:        General: Pt is alert, awake, not in acute distress Cardiovascular: RRR, S1/S2 +, no rubs, no gallops Respiratory: CTA bilaterally, no wheezing, no rhonchi Abdominal: Soft, NT, ND, bowel sounds + Extremities: no edema, no cyanosis   The results of significant diagnostics from this hospitalization (including imaging, microbiology, ancillary and laboratory) are listed below for reference.     Microbiology: Recent Results (from the  past 240 hour(s))  MRSA PCR Screening     Status: None   Collection Time: 07/15/18  7:59 PM  Result Value Ref Range Status   MRSA by PCR NEGATIVE NEGATIVE Final    Comment:        The GeneXpert MRSA Assay (FDA approved for NASAL specimens only), is one component of a comprehensive MRSA colonization surveillance program. It is not intended to diagnose MRSA infection nor to guide or monitor treatment for MRSA infections. Performed at [facility], [place] [address] [place], [place] [address]   Urine Culture     Status: Abnormal   Collection Time: 07/18/18 11:07 PM  Result Value Ref Range Status   Specimen Description URINE, CLEAN CATCH  Final   Special Requests NONE  Final   Culture (A)  Final    <10,000 COLONIES/mL INSIGNIFICANT GROWTH Performed at [facility], [place] [address], [place], [place] [address]    Report Status 07/20/2018 FINAL  Final  Respiratory Panel by PCR     Status: None   Collection Time: 07/19/18 12:20 AM  Result Value Ref Range Status   Adenovirus NOT  DETECTED NOT DETECTED Final   Coronavirus 229E NOT DETECTED NOT DETECTED Final   Coronavirus HKU1 NOT DETECTED NOT DETECTED Final   Coronavirus NL63 NOT DETECTED NOT DETECTED Final   Coronavirus OC43 NOT DETECTED NOT DETECTED Final   Metapneumovirus NOT DETECTED NOT DETECTED Final   Rhinovirus / Enterovirus NOT DETECTED NOT DETECTED Final   Influenza A NOT DETECTED NOT DETECTED Final   Influenza B NOT DETECTED NOT DETECTED Final   Parainfluenza Virus 1 NOT DETECTED NOT DETECTED Final   Parainfluenza Virus 2 NOT DETECTED NOT DETECTED Final   Parainfluenza Virus 3 NOT DETECTED NOT DETECTED Final   Parainfluenza Virus 4 NOT DETECTED NOT DETECTED Final   Respiratory Syncytial Virus NOT DETECTED NOT DETECTED Final   Bordetella pertussis NOT DETECTED NOT DETECTED Final   Chlamydophila pneumoniae NOT DETECTED NOT DETECTED Final   Mycoplasma pneumoniae NOT DETECTED NOT DETECTED Final    Comment: Performed at [facility], [place] [address], [place], [place] [address]     Labs: BNP (last 3 results) No results for input(s): BNP in the last 8760 hours. Basic Metabolic Panel: Recent Labs  Lab 07/16/18 0318 07/18/18 1645 07/19/18 0323 07/19/18 0802 07/20/18 0228  NA 138 140 139  --  141  K 4.6 3.7 4.9  --  4.0  CL 105 103 106  --  106  CO2 22 26 23   --  26  GLUCOSE 137* 134* 221*  --  122*  BUN 16 13 16   --  18  CREATININE 1.17 1.01 1.45*  --  1.05  CALCIUM 8.6* 9.1 8.9  --  9.0  MG  --   --   --  1.9  --   PHOS  --   --   --  2.1*  --    Liver Function Tests: Recent Labs  Lab 07/19/18 0323  AST 27  ALT 27  ALKPHOS 50  BILITOT 1.0  PROT 6.3*  ALBUMIN 3.1*   No results for input(s): LIPASE, AMYLASE in the last 168 hours. No results for input(s): AMMONIA in the last 168 hours. CBC: Recent Labs  Lab 07/16/18 0318 07/18/18 1645 07/19/18 0323 07/19/18 0802 07/20/18 0228  WBC  --  8.2 5.2 8.0 8.3  NEUTROABS  --  4.9  --   --   --   HGB  12.7* 13.7 12.6* 11.8* 11.5*   HCT 38.9* 40.3 37.7* 34.4* 33.8*  MCV  --  91.6 93.1 91.0 91.8  PLT  --  180 162 177 181   Cardiac Enzymes: Recent Labs  Lab 07/18/18 2106 07/19/18 0323 07/19/18 0802  TROPONINI <0.03 <0.03 <0.03   BNP: Invalid input(s): POCBNP CBG: No results for input(s): GLUCAP in the last 168 hours. D-Dimer No results for input(s): DDIMER in the last 72 hours. Hgb A1c Recent Labs    07/19/18 0323  HGBA1C 5.1   Lipid Profile Recent Labs    07/19/18 0323  CHOL 115  HDL 38*  LDLCALC 67  TRIG 48  CHOLHDL 3.0   Thyroid function studies Recent Labs    07/19/18 0323  TSH 0.830   Anemia work up No results for input(s): VITAMINB12, FOLATE, FERRITIN, TIBC, IRON, RETICCTPCT in the last 72 hours. Urinalysis    Component Value Date/Time   COLORURINE YELLOW 07/18/2018 2307   APPEARANCEUR CLEAR 07/18/2018 2307   LABSPEC 1.015 07/18/2018 2307   PHURINE 5.0 07/18/2018 2307   GLUCOSEU 50 (A) 07/18/2018 2307   HGBUR NEGATIVE 07/18/2018 2307   BILIRUBINUR NEGATIVE 07/18/2018 2307   KETONESUR 20 (A) 07/18/2018 2307   PROTEINUR NEGATIVE 07/18/2018 2307   UROBILINOGEN 1.0 09/09/2014 1226   NITRITE NEGATIVE 07/18/2018 2307   LEUKOCYTESUR NEGATIVE 07/18/2018 2307   Sepsis Labs Invalid input(s): PROCALCITONIN,  WBC,  LACTICIDVEN Microbiology Recent Results (from the past 240 hour(s))  MRSA PCR Screening     Status: None   Collection Time: 07/15/18  7:59 PM  Result Value Ref Range Status   MRSA by PCR NEGATIVE NEGATIVE Final    Comment:        The GeneXpert MRSA Assay (FDA approved for NASAL specimens only), is one component of a comprehensive MRSA colonization surveillance program. It is not intended to diagnose MRSA infection nor to guide or monitor treatment for MRSA infections. Performed at [facility], [place] [address], [place], [place] [address]   Urine Culture     Status: Abnormal   Collection Time: 07/18/18 11:07 PM  Result Value Ref Range Status   Specimen  Description URINE, CLEAN CATCH  Final   Special Requests NONE  Final   Culture (A)  Final    <10,000 COLONIES/mL INSIGNIFICANT GROWTH Performed at [facility], [place] [address] [place],  [address]    Report Status 07/20/2018 FINAL  Final  Respiratory Panel by PCR     Status: None   Collection Time: 07/19/18 12:20 AM  Result Value Ref Range Status   Adenovirus NOT DETECTED NOT DETECTED Final   Coronavirus 229E NOT DETECTED NOT DETECTED Final   Coronavirus HKU1 NOT DETECTED NOT DETECTED Final   Coronavirus NL63 NOT DETECTED NOT DETECTED Final   Coronavirus OC43 NOT DETECTED NOT DETECTED Final   Metapneumovirus NOT DETECTED NOT DETECTED Final   Rhinovirus / Enterovirus NOT DETECTED NOT DETECTED Final   Influenza A NOT DETECTED NOT DETECTED Final   Influenza B NOT DETECTED NOT DETECTED Final   Parainfluenza Virus 1 NOT DETECTED NOT DETECTED Final   Parainfluenza Virus 2 NOT DETECTED NOT DETECTED Final   Parainfluenza Virus 3 NOT DETECTED NOT DETECTED Final   Parainfluenza Virus 4 NOT DETECTED NOT DETECTED Final   Respiratory Syncytial Virus NOT DETECTED NOT DETECTED Final   Bordetella pertussis NOT DETECTED NOT DETECTED Final   Chlamydophila pneumoniae NOT DETECTED NOT DETECTED Final   Mycoplasma pneumoniae NOT DETECTED NOT DETECTED Final  Comment: Performed at [facility], [place] [address], [place], [place] [address]   Time spent: 30 min  SIGNED:   Marylu Lund, MD  [facility] 07/21/2018, 6:16 PM  If 7PM-7AM, please contact night-coverage

## 2018-07-20 NOTE — Progress Notes (Signed)
Progress Note  Patient Name: [name] Date of Encounter: 07/20/2018  Primary Cardiologist: Previously Dr. Tamala Julian  Subjective   Doing well this AM, no issues. Being discharged.  Inpatient Medications    Scheduled Meds: . aspirin EC  81 mg Oral QHS  . atorvastatin  40 mg Oral QHS  . fentaNYL  25 mcg Transdermal Q72H  . guaiFENesin  600 mg Oral BID  . latanoprost  1 drop Both Eyes QHS  . loratadine  10 mg Oral Daily  . olopatadine  1 drop Both Eyes Q1200  . rivaroxaban  20 mg Oral Q supper  . senna  1 tablet Oral BID  . traZODone  100 mg Oral QHS  . vortioxetine HBr  10 mg Oral QHS   Continuous Infusions:  PRN Meds: acetaminophen, bisacodyl, levalbuterol, ondansetron **OR** ondansetron (ZOFRAN) IV, oxyCODONE-acetaminophen, polyethylene glycol   Vital Signs    Vitals:   07/19/18 1645 07/19/18 2122 07/20/18 0527 07/20/18 0741  BP: 127/80 126/78 115/74 (!) 141/84  Pulse: 79 75 64 70  Resp: (!) 24 18 16    Temp: 97.7 F (36.5 C) 98.8 F (37.1 C) 98.5 F (36.9 C) 98.5 F (36.9 C)  TempSrc: Oral Oral Oral Oral  SpO2: 100% 95% 95% 96%  Weight:      Height:        Intake/Output Summary (Last 24 hours) at 07/20/2018 1125 Last data filed at 07/20/2018 0855 Gross per 24 hour  Intake 796.51 ml  Output 1530 ml  Net -733.49 ml   Filed Weights   07/18/18 1659 07/19/18 0544  Weight: 104.3 kg 107.3 kg    Telemetry    NSR- Personally Reviewed  ECG    No new since yesterday - Personally Reviewed  Physical Exam   GEN: No acute distress.   Neck: No JVD Cardiac: RRR, no murmurs, rubs, or gallops.  Respiratory: Clear to auscultation bilaterally. GI: Soft, nontender, non-distended  MS: No edema; No deformity. Left arm in sling. Neuro:  Nonfocal  Psych: Normal affect   Labs    Chemistry Recent Labs  Lab 07/18/18 1645 07/19/18 0323 07/20/18 0228  NA 140 139 141  K 3.7 4.9 4.0  CL 103 106 106  CO2 26 23 26   GLUCOSE 134* 221* 122*  BUN 13 16 18     CREATININE 1.01 1.45* 1.05  CALCIUM 9.1 8.9 9.0  PROT  --  6.3*  --   ALBUMIN  --  3.1*  --   AST  --  27  --   ALT  --  27  --   ALKPHOS  --  50  --   BILITOT  --  1.0  --   GFRNONAA >60 49* >60  GFRAA >60 57* >60  ANIONGAP 11 10 9      Hematology Recent Labs  Lab 07/19/18 0323 07/19/18 0802 07/20/18 0228  WBC 5.2 8.0 8.3  RBC 4.05* 3.78* 3.68*  HGB 12.6* 11.8* 11.5*  HCT 37.7* 34.4* 33.8*  MCV 93.1 91.0 91.8  MCH 31.1 31.2 31.3  MCHC 33.4 34.3 34.0  RDW 12.0 12.1 12.2  PLT 162 177 181    Cardiac Enzymes Recent Labs  Lab 07/18/18 2106 07/19/18 0323 07/19/18 0802  TROPONINI <0.03 <0.03 <0.03   No results for input(s): TROPIPOC in the last 168 hours.   BNPNo results for input(s): BNP, PROBNP in the last 168 hours.   DDimer No results for input(s): DDIMER in the last 168 hours.   Radiology  Dg Chest Port 1 View  Result Date: 07/18/2018 CLINICAL DATA:  Atrial fibrillation EXAM: PORTABLE CHEST 1 VIEW COMPARISON:  08/30/2017 FINDINGS: The heart size and mediastinal contours are within normal limits. Both lungs are clear. The visualized skeletal structures show prior bilateral shoulder replacement and cervical spine fusion IMPRESSION: No active disease. Electronically Signed   By: Inez Catalina M.D.   On: 07/18/2018 21:53    Cardiac Studies   Echo 07/19/18 - Left ventricle: The cavity size was normal. Systolic function was   normal. The estimated ejection fraction was in the range of 60%   to 65%. Wall motion was normal; there were no regional wall   motion abnormalities. Features are consistent with a pseudonormal   left ventricular filling pattern, with concomitant abnormal   relaxation and increased filling pressure (grade 2 diastolic   dysfunction). There was no evidence of elevated ventricular   filling pressure by Doppler parameters. - Aortic root: The aortic root was normal in size. - Left atrium: The atrium was moderately dilated. - Right ventricle:  The cavity size was normal. Wall thickness was   normal. Systolic function was normal. - Right atrium: The atrium was normal in size. - Tricuspid valve: There was mild regurgitation. - Pulmonary arteries: Systolic pressure was within the normal   range. - Inferior vena cava: The vessel was dilated. The respirophasic   diameter changes were blunted (< 50%), consistent with elevated   central venous pressure. - Pericardium, extracardiac: There was no pericardial effusion.  Patient Profile     67 y.o. male with a history of hypertension, hyperlipidemia, obstructive sleep apnea on CPAP, DVT in 2004 which required hospitalization, chronic back pain, BPH, s/p recent left total shoulder arthroplasty who is being followed for the evaluation of atrial fibrillation  Assessment & Plan    New Onset Atrial Fibrillation with RVR: doing well, no recurrence Summary: - Initial EKG showed atrial fibrillation with ventricular rate of 112 bpm. Patient spontaneously converted back to sinus rhythm in the ED before Cardizem drip could be started. Patient currently in sinus rhythm on telemetry.  - Troponin negative x3, echo with diastolic dysfunction, TSH and electrolytes normal. - CHADSVASC = 3 (HTN, HLD, age). Started on rivaroxaban - Will place outpatient monitor to see if his symptoms correlate to arrhythmia. If not, would consider outpatient lexiscan for further evaluation.   Hypertension - BP was elevated off of his home medication, was on lisinopril 20 mg daily. Would restart  Hyperlipidemia - Continue Lipitor 39m daily.   [facility] will sign off.   Medication Recommendations:  Rivaroxaban, lisinopril, atorvastatin, as noted Other recommendations (labs, testing, etc):  Scheduled for monitor placement Follow up as an outpatient:  We have arranged with our office  For questions or updates, please contact CClemonsPlease consult [email or web address] for contact info under      Signed, BBuford Dresser MD  07/20/2018, 11:25 AM

## 2018-07-20 NOTE — Progress Notes (Signed)
Pt has multiple episodes of O2 saturation drops below 89 throughout the night while on CPAP. On assessment pt was asymptomatic with no sign of distress.

## 2018-07-20 NOTE — Discharge Instructions (Signed)

## 2018-07-20 NOTE — Progress Notes (Signed)
patient in a stable condition, discharge education reviewed with patient and his spouse at bedside, they verbalized understanding,fentanyl 71mg patch remains  on the left side of patient's chest, iv removed, tele dc ccmd notified, patient belongings at bedside, patient to be transported home by his spouse.

## 2018-07-25 DIAGNOSIS — H401131 Primary open-angle glaucoma, bilateral, mild stage: Secondary | ICD-10-CM | POA: Diagnosis not present

## 2018-07-25 DIAGNOSIS — Z471 Aftercare following joint replacement surgery: Secondary | ICD-10-CM | POA: Diagnosis not present

## 2018-07-25 DIAGNOSIS — Z96612 Presence of left artificial shoulder joint: Secondary | ICD-10-CM | POA: Diagnosis not present

## 2018-07-29 ENCOUNTER — Ambulatory Visit (INDEPENDENT_AMBULATORY_CARE_PROVIDER_SITE_OTHER): Payer: Medicare Other | Admitting: Family Medicine

## 2018-07-29 ENCOUNTER — Encounter: Payer: Self-pay | Admitting: Family Medicine

## 2018-07-29 VITALS — BP 128/64 | HR 82 | Temp 98.2°F | Resp 14 | Ht 67.0 in | Wt 233.0 lb

## 2018-07-29 DIAGNOSIS — J40 Bronchitis, not specified as acute or chronic: Secondary | ICD-10-CM

## 2018-07-29 DIAGNOSIS — I480 Paroxysmal atrial fibrillation: Secondary | ICD-10-CM | POA: Diagnosis not present

## 2018-07-29 DIAGNOSIS — T6591XA Toxic effect of unspecified substance, accidental (unintentional), initial encounter: Secondary | ICD-10-CM

## 2018-07-29 MED ORDER — CLOTRIMAZOLE-BETAMETHASONE 1-0.05 % EX CREA: 1.0000 "application " | TOPICAL_CREAM | Freq: Two times a day (BID) | CUTANEOUS | 0 refills | Status: DC

## 2018-07-29 MED ORDER — DOXYCYCLINE HYCLATE 100 MG PO TABS: 100.0000 mg | ORAL_TABLET | Freq: Two times a day (BID) | ORAL | 0 refills | Status: DC

## 2018-07-29 NOTE — Progress Notes (Signed)
Subjective:    Patient ID: [name], male    DOB: September 11, 1950, 67 y.o.   MRN: [id number]  HPI Patient underwent surgery on his shoulder in December.  At that time, he was placed in a condom cath for the surgery.  An adhesive was used.  Patient is allergic to certain adhesive tape.  Shortly after discharge from the hospital, his scrotum and the area around his penis became extremely swollen and erythematous.  He went back to the emergency room.  While he was being admitted for possible cellulitis versus a topical allergic reaction, the patient was found to be in atrial fibrillation with rapid ventricular response.  He spontaneously converted back to normal sinus rhythm.  He was then discharged from the hospital on Xarelto for stroke prevention.  His aspirin and lisinopril was discontinued.  He was not given any medication for cellulitis in the scrotum.  He was not given any medication for possible allergic reaction to the skin of the scrotum.  He is here today complaining of persistent erythema and swelling and itching in the skin over his scrotum and around his penis.  The skin is erythematous.  However it is not warm.  Is not indurated.  There is no substantial swelling.  There is some mild erythema at the base of the penis.  There is no warmth or tenderness or pain.  It does itch.  I suspect that this is more likely an allergic reaction rather than cellulitis.  .  Patient also reports a cough for the last 3 weeks.  The cough is productive of yellow sputum.  He reports rhonchorous breath sound and chest congestion.  He also denies chest pain.  He denies shortness of breath.  He denies any fever.  Past Medical History:  Diagnosis Date  . Arthritis    oa and ddd  . Back pain, chronic    pt states he has 3 herniated disks--uses fentanyl patch for pain  . Blood transfusion without reported diagnosis 1950   rH negative"at birth only"  . BPH (benign prostatic hyperplasia)    frequent urination,  not able to hold urine well  . 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 Cataracts, both eyes    worst on left  . Colon polyps   . Complication of anesthesia    pt had spinal for a knee replacement--"woke up" during surgery--and sick after surgery  . Depression   . Diverticulosis   . DVT (deep venous thrombosis) ([place]) 2001 or 2002   right leg-required hospitalization x 8 days  . DVT (deep venous thrombosis) ([place]) 1996   right  . Glaucoma    both eyes  . H/O hiatal hernia   . Hearing impaired person, bilateral    bilateral  . Hemorrhoid   . History of IBS   . History of kidney stones    x 1-passed  . Hyperlipidemia 2012  . Hypertension   . Lumbago   . Lumbar post-laminectomy syndrome   . PONV (postoperative nausea and vomiting)   . Sleep apnea    pt uses cpap - setting of 16  . Ulcer 1966  . Ulcerative colitis ([place])   . Wears glasses   . Wears hearing aid    Past Surgical History:  Procedure Laterality Date  . APPENDECTOMY  1993  . BACK SURGERY  2008    bone spur removed   . bilateral carpal tunnel release 2009    . cervical  fusion 2011--pt has good range of motion neck    . CHOLECYSTECTOMY  1994  . INSERTION OF MESH Bilateral 07/31/2016   Procedure: INSERTION OF MESH;  Surgeon: [name], MD;  Location: [facility];  Service: General;  Laterality: Bilateral;  . JOINT REPLACEMENT Bilateral    2001 left total knee and 1985 right total knee  . MASS EXCISION Left 01/04/2014   Procedure: LEFT MIDDLE FINGER MASS EXCISION WITH NAILBED REPAIR AND ADVANCEMENT FLAP;  Surgeon: [name], MD;  Location: [name];  Service: Orthopedics;  Laterality: Left;  . mass removed from 2nd finger Left aug 2014   squamous carcinoma  . nissen fundoplication 1610    . PROSTATE SURGERY  2014   TURP  . right wrist fusion 12/12 - pt wearing brace on the wrist-not started physical therapy yet Right    fusion prevent limited ROM to right wrist  . SPINE SURGERY   2009   cervical spine fusion  . thyroid nodule     removed  . TOTAL KNEE REVISION Right 11/17/2013   Procedure: RIGHT KNEE POLYETHYLENE REVISION, bone graft;  Surgeon: [name], MD;  Location: [facility];  Service: Orthopedics;  Laterality: Right;  . TOTAL SHOULDER ARTHROPLASTY Right 01/28/2018   Procedure: RIGHT TOTAL SHOULDER ARTHROPLASTY;  Surgeon: [name], MD;  Location: [facility];  Service: Orthopedics;  Laterality: Right;  . TOTAL SHOULDER ARTHROPLASTY Left 07/15/2018   Procedure: LEFT SHOULDER ANATOMIC TOTAL SHOULDER ARTHROPLASTY;  Surgeon: [name], MD;  Location: [facility];  Service: Orthopedics;  Laterality: Left;  . TRANSURETHRAL RESECTION OF PROSTATE  10/09/2011   Procedure: TRANSURETHRAL RESECTION OF THE PROSTATE WITH GYRUS INSTRUMENTS;  Surgeon: Fredricka Bonine, MD;  Location: [facility];  Service: Urology;  Laterality: N/A;  . uvvvp    . XI ROBOTIC ASSISTED INGUINAL HERNIA REPAIR WITH MESH Bilateral 07/31/2016   Procedure: XI ROBOTIC BILATERAL INGUINAL HERNIA REPAIR WITH MESH;  Surgeon: [name], MD;  Location: [facility];  Service: General;  Laterality: Bilateral;   Current Outpatient Medications on File Prior to Visit  Medication Sig Dispense Refill  . acetaminophen (TYLENOL) 500 MG tablet Take 1,000 mg by mouth every 6 (six) hours as needed for headache (pain).     Marland Kitchen albuterol (PROVENTIL HFA;VENTOLIN HFA) 108 (90 Base) MCG/ACT inhaler Inhale 2 puffs into the lungs every 4 (four) hours as needed for wheezing or shortness of breath. 1 Inhaler 0  . atorvastatin (LIPITOR) 40 MG tablet TAKE 1 TABLET BY MOUTH ONCE DAILY (Patient taking differently: Take 40 mg by mouth at bedtime. ) 90 tablet 3  . Calcium Carbonate-Vitamin D (CALCIUM + D PO) Take 1 tablet by mouth at bedtime.     . cetirizine (ZYRTEC) 10 MG tablet Take 10 mg by mouth at bedtime.    . fentaNYL (DURAGESIC - DOSED MCG/HR) 25 MCG/HR Place 25 mcg onto the skin every 3 (three) days.     . fluticasone (FLONASE) 50  MCG/ACT nasal spray Place 1 spray into both nostrils at bedtime as needed for allergies or rhinitis.     . Latanoprost (XELPROS) 0.005 % EMUL Place 1 drop into both eyes at bedtime.    . magnesium oxide (MAG-OX) 400 MG tablet Take 400 mg by mouth at bedtime.     . Multiple Vitamin (MULTIVITAMIN WITH MINERALS) TABS tablet Take 1 tablet by mouth at bedtime.     . Olopatadine HCl (PAZEO) 0.7 % SOLN Place 1 drop into both eyes daily at 12 noon.     Marland Kitchen  Omega-3 Fatty Acids (FISH OIL) 500 MG CAPS Take 500 mg by mouth at bedtime.     Marland Kitchen oxyCODONE-acetaminophen (PERCOCET) 5-325 MG tablet Take 1-2 tablets by mouth every 4 (four) hours as needed for moderate pain or severe pain. (Patient taking differently: Take 1 tablet by mouth every 4 (four) hours as needed for moderate pain or severe pain. ) 30 tablet 0  . PRESCRIPTION MEDICATION Inhale into the lungs at bedtime. CPAP    . rivaroxaban (XARELTO) 20 MG TABS tablet Take 1 tablet (20 mg total) by mouth daily with supper. 30 tablet 0  . traZODone (DESYREL) 50 MG tablet Take 100 mg by mouth at bedtime.     . vortioxetine HBr (TRINTELLIX) 10 MG TABS Take 1 tablet (10 mg total) by mouth daily. (Patient taking differently: Take 10 mg by mouth at bedtime. ) 14 tablet 0   No current facility-administered medications on file prior to visit.    Allergies  Allergen Reactions  . Adhesive [Tape] Other (See Comments)    Causes blisters - pls use paper tape   Social History   Socioeconomic History  . Marital status: Married    Spouse name: Not on file  . Number of children: Not on file  . Years of education: Not on file  . Highest education level: Not on file  Occupational History  . Occupation: disabled  Social Needs  . Financial resource strain: Not on file  . Food insecurity:    Worry: Not on file    Inability: Not on file  . Transportation needs:    Medical: Not on file    Non-medical: Not on file  Tobacco Use  . Smoking status: Former Smoker     Packs/day: 1.00    Years: 19.00    Pack years: 19.00    Types: Cigarettes    Last attempt to quit: 08/04/1983    Years since quitting: 35.0  . Smokeless tobacco: Never Used  . Tobacco comment: quit smoking 1985  Substance and Sexual Activity  . Alcohol use: Yes    Alcohol/week: 6.0 standard drinks    Types: 6 Cans of beer per week    Comment: maybe 2 or 3 beers per week  . Drug use: No  . Sexual activity: Not Currently  Lifestyle  . Physical activity:    Days per week: Not on file    Minutes per session: Not on file  . Stress: Not on file  Relationships  . Social connections:    Talks on phone: Not on file    Gets together: Not on file    Attends religious service: Not on file    Active member of club or organization: Not on file    Attends meetings of clubs or organizations: Not on file    Relationship status: Not on file  . Intimate partner violence:    Fear of current or ex partner: Not on file    Emotionally abused: Not on file    Physically abused: Not on file    Forced sexual activity: Not on file  Other Topics Concern  . Not on file  Social History Narrative  . Not on file   Family History  Problem Relation Age of Onset  . Colon cancer Father   . Bone cancer Father        deceased  . Cancer Father   . Diabetes Father   . Prostate cancer Father   . Arthritis Mother   . Hearing loss Mother   .  Hyperlipidemia Mother   . Hypertension Mother   . Deep vein thrombosis Mother   . Diabetes Sister   . Hearing loss Maternal Grandmother   . Diabetes Paternal Grandfather      Review of Systems  All other systems reviewed and are negative.      Objective:   Physical Exam  Constitutional: He is oriented to person, place, and time. He appears well-developed and well-nourished. No distress.  HENT:  Head: Normocephalic and atraumatic.  Right Ear: External ear normal.  Left Ear: External ear normal.  Nose: Nose normal.  Mouth/Throat: Oropharynx is clear and  moist. No oropharyngeal exudate.  Eyes: Pupils are equal, round, and reactive to light. Conjunctivae and EOM are normal. Right eye exhibits no discharge. Left eye exhibits no discharge. No scleral icterus.  Neck: Normal range of motion. Neck supple. No JVD present. No tracheal deviation present. No thyromegaly present.  Cardiovascular: Normal rate, regular rhythm, normal heart sounds and intact distal pulses. Exam reveals no gallop and no friction rub.  No murmur heard. Pulmonary/Chest: Effort normal. No stridor. No respiratory distress. He has no wheezes. He has rhonchi in the right lower field and the left lower field. He has no rales. He exhibits no tenderness.  Abdominal: Soft. Bowel sounds are normal. He exhibits no distension and no mass. There is no abdominal tenderness. There is no rebound and no guarding.  Genitourinary:        Musculoskeletal: Normal range of motion.        General: No tenderness, deformity or edema.  Lymphadenopathy:    He has no cervical adenopathy.  Neurological: He is alert and oriented to person, place, and time. He has normal reflexes. No cranial nerve deficit. He exhibits normal muscle tone. Coordination normal.  Skin: Skin is warm. No rash noted. He is not diaphoretic. No erythema. No pallor.  Psychiatric: He has a normal mood and affect. His behavior is normal. Judgment and thought content normal.  Vitals reviewed.         Assessment & Plan:  Allergic reaction to chemical substance, accidental or unintentional, initial encounter  Bronchitis  Paroxysmal atrial fibrillation ([place])   I believe the patient has a topical allergic reaction to the adhesive tape used for his condom cath.  Is better than it was in the hospital but is still persistent.  I recommended using Lotrisone cream for the topical steroid aspect to help control this.  Plus the steroid cream can be safely applied to intertriginous areas around the scrotum.  I do not believe this is  cellulitis.  Do this twice a day for 10 days and recheck immediately if worsening.  I believe the patient also has bronchitis and I will start him on doxycycline 100 mg p.o. twice daily for 10 days.  Currently the patient is in normal sinus rhythm.  His heart rate is well controlled.  His blood pressure is excellent.  Therefore I will not start the patient on metoprolol at the present time.  I will continue home on Xarelto for prevention of stroke.  He has an appointment to see his cardiologist.  He is being scheduled for a cardiac monitor for December 31 to monitor his heart rate to determine if this is a common recurrence that would require medication to help control his heart rate for if this was an isolated recurrence related to his recent surgery.  At the present time we will await the results of his cardiac consultation and cardiac monitor

## 2018-08-02 ENCOUNTER — Ambulatory Visit (INDEPENDENT_AMBULATORY_CARE_PROVIDER_SITE_OTHER): Payer: Medicare Other

## 2018-08-02 DIAGNOSIS — I4891 Unspecified atrial fibrillation: Secondary | ICD-10-CM | POA: Diagnosis not present

## 2018-08-06 ENCOUNTER — Telehealth: Payer: Self-pay | Admitting: Cardiology

## 2018-08-06 ENCOUNTER — Other Ambulatory Visit: Payer: Self-pay | Admitting: Cardiology

## 2018-08-06 MED ORDER — DILTIAZEM HCL 30 MG PO TABS: 30.0000 mg | ORAL_TABLET | Freq: Four times a day (QID) | ORAL | 3 refills | Status: DC | PRN

## 2018-08-06 MED ORDER — METOPROLOL TARTRATE 25 MG PO TABS: 25.0000 mg | ORAL_TABLET | Freq: Four times a day (QID) | ORAL | 3 refills | Status: DC | PRN

## 2018-08-06 NOTE — Progress Notes (Signed)
Interaction between cardizem and fentanyl. Cardizem dc'd- spoke to pharmacy. Will order metoprolol.

## 2018-08-06 NOTE — Telephone Encounter (Signed)
I received a call at 1:40 PM from [facility] to report that the patient had an auto triggered event of new onset A. fib at 180 bpm this morning at 10:50 AM.  Follow-up heart rates were in the 160s-170s.  I called the patient's cell phone with no answer.  I called his wife's cell phone and she told me that the patient was out with friends.  She says that at 1050 this morning he was feeling fine.  She says she can get in touch with them and he is actually out with an Designer, fashion/clothing.  She will have that person check his pulse and if it continues to be over 120 bpm or if he is having any symptoms she will have him come to the hospital.  I considered calling in some diltiazem for him.  His wife says that she will call and see how he is doing and let us know.

## 2018-08-06 NOTE — Telephone Encounter (Signed)
I called the patient and he is asymptomatic. Heart rate checked by his EMT friend as is around 100 bpm. After discussion with Dr. Harrell Gave, I have sent in Rx for diltiazem 30 mg prn HR >100. Reviewed use of medication and symptoms for pt to report. He verb understanding.

## 2018-08-09 DIAGNOSIS — H43813 Vitreous degeneration, bilateral: Secondary | ICD-10-CM | POA: Diagnosis not present

## 2018-08-09 DIAGNOSIS — H401131 Primary open-angle glaucoma, bilateral, mild stage: Secondary | ICD-10-CM | POA: Diagnosis not present

## 2018-08-09 DIAGNOSIS — H2513 Age-related nuclear cataract, bilateral: Secondary | ICD-10-CM | POA: Diagnosis not present

## 2018-08-09 DIAGNOSIS — H1712 Central corneal opacity, left eye: Secondary | ICD-10-CM | POA: Diagnosis not present

## 2018-08-15 ENCOUNTER — Telehealth: Payer: Self-pay | Admitting: Cardiology

## 2018-08-15 MED ORDER — RIVAROXABAN 20 MG PO TABS: 20.0000 mg | ORAL_TABLET | Freq: Every day | ORAL | 3 refills | Status: DC

## 2018-08-15 NOTE — Telephone Encounter (Signed)
New Message         *STAT* If patient is at the pharmacy, call can be transferred to refill team.   1. Which medications need to be refilled? (please list name of each medication and dose if known) Xarelto 20 mg  2. Which pharmacy/location (including street and city if local pharmacy) is medication to be sent to? [facility] [place], [place] - [address] [phone] (Phone) [phone] (Fax)     3. Do they need a 30 day or 90 day supply? [place]

## 2018-08-15 NOTE — Telephone Encounter (Signed)
Pt made aware that he can pick up refill of Xarelto at requested pharmacy

## 2018-08-24 DIAGNOSIS — Z4789 Encounter for other orthopedic aftercare: Secondary | ICD-10-CM | POA: Diagnosis not present

## 2018-08-25 ENCOUNTER — Ambulatory Visit (INDEPENDENT_AMBULATORY_CARE_PROVIDER_SITE_OTHER): Payer: Medicare Other | Admitting: Cardiology

## 2018-08-25 ENCOUNTER — Encounter: Payer: Self-pay | Admitting: Cardiology

## 2018-08-25 VITALS — BP 128/72 | HR 55 | Ht 67.0 in | Wt 238.0 lb

## 2018-08-25 DIAGNOSIS — I480 Paroxysmal atrial fibrillation: Secondary | ICD-10-CM | POA: Diagnosis not present

## 2018-08-25 DIAGNOSIS — E785 Hyperlipidemia, unspecified: Secondary | ICD-10-CM | POA: Diagnosis not present

## 2018-08-25 DIAGNOSIS — I10 Essential (primary) hypertension: Secondary | ICD-10-CM

## 2018-08-25 NOTE — Patient Instructions (Addendum)
Medication Instructions:   Your physician recommends that you continue on your current medications as directed. Please refer to the Current Medication list given to you today.   If you need a refill on your cardiac medications before your next appointment, please call your pharmacy.   Lab work: None at this time of visit   If you have labs (blood work) drawn today and your tests are completely normal, you will receive your results only by: Marland Kitchen MyChart Message (if you have MyChart) OR . A paper copy in the mail If you have any lab test that is abnormal or we need to change your treatment, we will call you to review the results.  Testing/Procedures:  None at this time of visit   Follow-Up:  At [facility], you and your health needs are our priority.  As part of our continuing mission to provide you with exceptional heart care, we have created designated Provider Care Teams.  These Care Teams include your primary Cardiologist (physician) and Advanced Practice Providers (APPs -  Physician Assistants and Nurse Practitioners) who all work together to provide you with the care you need, when you need it. You will need a follow up appointment in 6 months (July 2020).  Please call our office 2 months (May 2020) in advance to schedule this appointment.  You may see [name], MD or one of the following Advanced Practice Providers on your designated Care Team:   Rosaria Ferries, PA-C . Jory Sims, DNP, ANP  Any Other Special Instructions Will Be Listed Below (If Applicable).

## 2018-08-25 NOTE — Progress Notes (Signed)
Cardiology Office Note:    Date:  08/25/2018   ID:  [name], DOB 1951-04-10, MRN [id number]  PCP:  [name] Frizzle, MD  Cardiologist:  [name] Dresser, MD PhD  Referring MD: [name] Frizzle, MD   CC: post hospital follow up  History of Present Illness:    [name] is a 68 y.o. male with a hx of hypertension, hyperlipidemia, recent new atrial fibrillation with RVR who is seen in follow up for the evaluation and management of afib, hypertension, hyperlipidemia.  [name] was hospitalized in 40/9811 due to complications from urinary catheter placed during recent shoulder surgery, but he was also found to have new onset atrial fibrillation with RVR. He spontaneously converted to NSR. He was discharged on rivaroxaban.  Today: overall doing well. Does not notice when he is out of rhythm. Off of all BP agents except metoprolol. Has a log of multiple times/day BP, with most in the low 914N systolic. Had one aberrant high reading, but readings both shortly before and after were his normal range. Did not have any symptoms at the time. Has noticed occasional high heart rates on his fitbit, but they are not sustained. Believes he has only had to take the metoprolol once or twice since discharge. Tolerating anticoagulation, though it is expensive.  Spent time reviewing pathophysiology of atrial fibrillation, risk of stroke, rate vs. Rhythm control options. Still wearing monitor, has some sensitivity to the adhesive but not very bothersome.  Past Medical History:  Diagnosis Date  . Arthritis    oa and ddd  . Back pain, chronic    pt states he has 3 herniated disks--uses fentanyl patch for pain  . Blood transfusion without reported diagnosis 1950   rH negative"at birth only"  . BPH (benign prostatic hyperplasia)    frequent urination, not able to hold urine well  . 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 Cataracts, both eyes    worst on left  . Colon polyps    . Complication of anesthesia    pt had spinal for a knee replacement--"woke up" during surgery--and sick after surgery  . Depression   . Diverticulosis   . DVT (deep venous thrombosis) ([place]) 2001 or 2002   right leg-required hospitalization x 8 days  . DVT (deep venous thrombosis) ([place]) 1996   right  . Glaucoma    both eyes  . H/O hiatal hernia   . Hearing impaired person, bilateral    bilateral  . Hemorrhoid   . History of IBS   . History of kidney stones    x 1-passed  . Hyperlipidemia 2012  . Hypertension   . Lumbago   . Lumbar post-laminectomy syndrome   . PONV (postoperative nausea and vomiting)   . Sleep apnea    pt uses cpap - setting of 16  . Ulcer 1966  . Ulcerative colitis ([place])   . Wears glasses   . Wears hearing aid     Past Surgical History:  Procedure Laterality Date  . APPENDECTOMY  1993  . BACK SURGERY  2008    bone spur removed   . bilateral carpal tunnel release 2009    . cervical fusion 2011--pt has good range of motion neck    . CHOLECYSTECTOMY  1994  . INSERTION OF MESH Bilateral 07/31/2016   Procedure: INSERTION OF MESH;  Surgeon: [name], MD;  Location: [facility];  Service: General;  Laterality: Bilateral;  . JOINT REPLACEMENT  Bilateral    2001 left total knee and 1985 right total knee  . MASS EXCISION Left 01/04/2014   Procedure: LEFT MIDDLE FINGER MASS EXCISION WITH NAILBED REPAIR AND ADVANCEMENT FLAP;  Surgeon: [name], MD;  Location: [facility];  Service: Orthopedics;  Laterality: Left;  . mass removed from 2nd finger Left aug 2014   squamous carcinoma  . nissen fundoplication 4818    . PROSTATE SURGERY  2014   TURP  . right wrist fusion 12/12 - pt wearing brace on the wrist-not started physical therapy yet Right    fusion prevent limited ROM to right wrist  . SPINE SURGERY  2009   cervical spine fusion  . thyroid nodule     removed  . TOTAL KNEE REVISION Right 11/17/2013   Procedure: RIGHT KNEE POLYETHYLENE  REVISION, bone graft;  Surgeon: [name], MD;  Location: [facility];  Service: Orthopedics;  Laterality: Right;  . TOTAL SHOULDER ARTHROPLASTY Right 01/28/2018   Procedure: RIGHT TOTAL SHOULDER ARTHROPLASTY;  Surgeon: [name], MD;  Location: [place];  Service: Orthopedics;  Laterality: Right;  . TOTAL SHOULDER ARTHROPLASTY Left 07/15/2018   Procedure: LEFT SHOULDER ANATOMIC TOTAL SHOULDER ARTHROPLASTY;  Surgeon: [name], MD;  Location: [place];  Service: Orthopedics;  Laterality: Left;  . TRANSURETHRAL RESECTION OF PROSTATE  10/09/2011   Procedure: TRANSURETHRAL RESECTION OF THE PROSTATE WITH GYRUS INSTRUMENTS;  Surgeon: Fredricka Bonine, MD;  Location: [facility];  Service: Urology;  Laterality: N/A;  . uvvvp    . XI ROBOTIC ASSISTED INGUINAL HERNIA REPAIR WITH MESH Bilateral 07/31/2016   Procedure: XI ROBOTIC BILATERAL INGUINAL HERNIA REPAIR WITH MESH;  Surgeon: [name], MD;  Location: [facility];  Service: General;  Laterality: Bilateral;    Current Medications: Current Outpatient Medications on File Prior to Visit  Medication Sig  . acetaminophen (TYLENOL) 500 MG tablet Take 1,000 mg by mouth every 6 (six) hours as needed for headache (pain).   Marland Kitchen albuterol (PROVENTIL HFA;VENTOLIN HFA) 108 (90 Base) MCG/ACT inhaler Inhale 2 puffs into the lungs every 4 (four) hours as needed for wheezing or shortness of breath.  Marland Kitchen atorvastatin (LIPITOR) 40 MG tablet TAKE 1 TABLET BY MOUTH ONCE DAILY (Patient taking differently: Take 40 mg by mouth at bedtime. )  . Calcium Carbonate-Vitamin D (CALCIUM + D PO) Take 1 tablet by mouth at bedtime.   . fentaNYL (DURAGESIC - DOSED MCG/HR) 25 MCG/HR Place 25 mcg onto the skin every 3 (three) days.   . fluticasone (FLONASE) 50 MCG/ACT nasal spray Place 1 spray into both nostrils at bedtime as needed for allergies or rhinitis.   . Latanoprost (XELPROS) 0.005 % EMUL Place 1 drop into both eyes at bedtime.  . magnesium oxide (MAG-OX) 400 MG tablet Take 400  mg by mouth at bedtime.   . metoprolol tartrate (LOPRESSOR) 25 MG tablet Take 1 tablet (25 mg total) by mouth 4 (four) times daily as needed (heart rate greater than 100).  . Multiple Vitamin (MULTIVITAMIN WITH MINERALS) TABS tablet Take 1 tablet by mouth at bedtime.   . Olopatadine HCl (PAZEO) 0.7 % SOLN Place 1 drop into both eyes daily at 12 noon.   . Omega-3 Fatty Acids (FISH OIL) 500 MG CAPS Take 500 mg by mouth at bedtime.   Marland Kitchen PRESCRIPTION MEDICATION Inhale into the lungs at bedtime. CPAP  . rivaroxaban (XARELTO) 20 MG TABS tablet Take 1 tablet (20 mg total) by mouth daily with supper for 30 days.  . traZODone (  DESYREL) 50 MG tablet Take 100 mg by mouth at bedtime.   . vortioxetine HBr (TRINTELLIX) 10 MG TABS Take 1 tablet (10 mg total) by mouth daily. (Patient taking differently: Take 10 mg by mouth at bedtime. )   No current facility-administered medications on file prior to visit.      Allergies:   Adhesive [tape]   Social History   Socioeconomic History  . Marital status: Married    Spouse name: Not on file  . Number of children: Not on file  . Years of education: Not on file  . Highest education level: Not on file  Occupational History  . Occupation: disabled  Social Needs  . Financial resource strain: Not on file  . Food insecurity:    Worry: Not on file    Inability: Not on file  . Transportation needs:    Medical: Not on file    Non-medical: Not on file  Tobacco Use  . Smoking status: Former Smoker    Packs/day: 1.00    Years: 19.00    Pack years: 19.00    Types: Cigarettes    Last attempt to quit: 08/04/1983    Years since quitting: 35.0  . Smokeless tobacco: Never Used  . Tobacco comment: quit smoking 1985  Substance and Sexual Activity  . Alcohol use: Yes    Alcohol/week: 6.0 standard drinks    Types: 6 Cans of beer per week    Comment: maybe 2 or 3 beers per week  . Drug use: No  . Sexual activity: Not Currently  Lifestyle  . Physical activity:     Days per week: Not on file    Minutes per session: Not on file  . Stress: Not on file  Relationships  . Social connections:    Talks on phone: Not on file    Gets together: Not on file    Attends religious service: Not on file    Active member of club or organization: Not on file    Attends meetings of clubs or organizations: Not on file    Relationship status: Not on file  Other Topics Concern  . Not on file  Social History Narrative  . Not on file     Family History: The patient's family history includes Arthritis in his mother; Bone cancer in his father; Cancer in his father; Colon cancer in his father; Deep vein thrombosis in his mother; Diabetes in his father, paternal grandfather, and sister; Hearing loss in his maternal grandmother and mother; Hyperlipidemia in his mother; Hypertension in his mother; Prostate cancer in his father.  ROS:   Please see the history of present illness.  Additional pertinent ROS:  Constitutional: Negative for chills, fever, night sweats, unintentional weight loss  HENT: Negative for ear pain and hearing loss.   Eyes: Negative for loss of vision and eye pain.  Respiratory: Negative for cough, sputum, shortness of breath, wheezing.   Cardiovascular: Negative for chest pain, palpitations, PND, orthopnea, lower extremity edema and claudication.  Gastrointestinal: Negative for abdominal pain, melena, and hematochezia.  Genitourinary: Negative for dysuria and hematuria.  Musculoskeletal: Negative for falls and myalgias.  Skin: Negative for itching and rash.  Neurological: Negative for focal weakness, focal sensory changes and loss of consciousness.  Endo/Heme/Allergies: Does not bruise/bleed easily.    EKGs/Labs/Other Studies Reviewed:    The following studies were reviewed today: Echo 07/19/18 - Left ventricle: The cavity size was normal. Systolic function was   normal. The estimated ejection fraction was  in the range of 60%   to 65%. Wall  motion was normal; there were no regional wall   motion abnormalities. Features are consistent with a pseudonormal   left ventricular filling pattern, with concomitant abnormal   relaxation and increased filling pressure (grade 2 diastolic   dysfunction). There was no evidence of elevated ventricular   filling pressure by Doppler parameters. - Aortic root: The aortic root was normal in size. - Left atrium: The atrium was moderately dilated. - Right ventricle: The cavity size was normal. Wall thickness was   normal. Systolic function was normal. - Right atrium: The atrium was normal in size. - Tricuspid valve: There was mild regurgitation. - Pulmonary arteries: Systolic pressure was within the normal   range. - Inferior vena cava: The vessel was dilated. The respirophasic   diameter changes were blunted (< 50%), consistent with elevated   central venous pressure. - Pericardium, extracardiac: There was no pericardial effusion.  MPI 2007--I cannot see complete results  EKG:  EKG is personally reviewed.  The ekg ordered today demonstrates sinus bradycardia at 55 bpm.  Recent Labs: 07/19/2018: ALT 27; Magnesium 1.9; TSH 0.830 07/20/2018: BUN 18; Creatinine, Ser 1.05; Hemoglobin 11.5; Platelets 181; Potassium 4.0; Sodium 141  Recent Lipid Panel    Component Value Date/Time   CHOL 115 07/19/2018 0323   TRIG 48 07/19/2018 0323   HDL 38 (L) 07/19/2018 0323   CHOLHDL 3.0 07/19/2018 0323   VLDL 10 07/19/2018 0323   LDLCALC 67 07/19/2018 0323   LDLCALC 80 04/07/2018 0815    Physical Exam:    VS:  BP 128/72   Pulse (!) 55   Ht 5' 7"  (1.702 m)   Wt 238 lb (108 kg)   BMI 37.28 kg/m     Wt Readings from Last 3 Encounters:  08/25/18 238 lb (108 kg)  07/29/18 233 lb (105.7 kg)  07/19/18 236 lb 8 oz (107.3 kg)     GEN: Well nourished, well developed in no acute distress HEENT: Normal NECK: No JVD; No carotid bruits LYMPHATICS: No lymphadenopathy CARDIAC: regular rhythm, normal  S1 and S2, no murmurs, rubs, gallops. Radial and DP pulses 2+ bilaterally. RESPIRATORY:  Clear to auscultation without rales, wheezing or rhonchi  ABDOMEN: Soft, non-tender, non-distended MUSCULOSKELETAL:  No edema; No deformity  SKIN: Warm and dry NEUROLOGIC:  Alert and oriented x 3 PSYCHIATRIC:  Normal affect   ASSESSMENT:    1. Paroxysmal atrial fibrillation (HCC)   2. Essential hypertension   3. Hyperlipidemia, unspecified hyperlipidemia type    PLAN:    1. Atrial fibrillation, with RVR in hospital and spontaneous conversion: -CHA2DS2/VAS Stroke Risk Points=3 (HTN, HLD, age)  -on rivaroxaban for anticoagulation -currently wearing outpatient monitor to determine heart rate range, burden of arrhythmia to guide medication strategy -asymptomatic -discussed use of PRN medication. Plan is to use if HR remains sustained >110 bpm for more than several minutes, as it will take at least 20 minutes for oral medication to take effect. Transient increases in HR are ok.   2. Hypertension: now low BP, off all meds except PRN metoprolol -if elevates in the future, did well on lisinopril  3. Hyperlipidemia  -continue atorvastatin  4. Lifestyle recommendations -recommend heart healthy/Mediterranean diet, with whole grains, fruits, vegetable, fish, lean meats, nuts, and olive oil. Limit salt. -recommend moderate walking, 3-5 times/week for 30-50 minutes each session. Aim for at least 150 minutes.week. Goal should be pace of 3 miles/hours, or walking 1.5 miles in 30 minutes -recommend  avoidance of tobacco products. Avoid excess alcohol.  Plan for follow up: 6 mos or sooner PRN  TIME SPENT WITH PATIENT: 25 minutes of direct patient care. More than 50% of that time was spent on coordination of care and counseling regarding etiology and management of atrial fibrillation.  [name] Dresser, MD, PhD [place]  [facility]   Medication Adjustments/Labs and Tests Ordered: Current  medicines are reviewed at length with the patient today.  Concerns regarding medicines are outlined above.  Orders Placed This Encounter  Procedures  . EKG 12-Lead   No orders of the defined types were placed in this encounter.   Patient Instructions  Medication Instructions:   Your physician recommends that you continue on your current medications as directed. Please refer to the Current Medication list given to you today.   If you need a refill on your cardiac medications before your next appointment, please call your pharmacy.   Lab work: None at this time of visit   If you have labs (blood work) drawn today and your tests are completely normal, you will receive your results only by: Marland Kitchen MyChart Message (if you have MyChart) OR . A paper copy in the mail If you have any lab test that is abnormal or we need to change your treatment, we will call you to review the results.  Testing/Procedures:  None at this time of visit   Follow-Up:  At [facility], you and your health needs are our priority.  As part of our continuing mission to provide you with exceptional heart care, we have created designated Provider Care Teams.  These Care Teams include your primary Cardiologist (physician) and Advanced Practice Providers (APPs -  Physician Assistants and Nurse Practitioners) who all work together to provide you with the care you need, when you need it. You will need a follow up appointment in 6 months (July 2020).  Please call our office 2 months (May 2020) in advance to schedule this appointment.  You may see [name] Dresser, MD or one of the following Advanced Practice Providers on your designated Care Team:   Rosaria Ferries, PA-C . Jory Sims, DNP, ANP  Any Other Special Instructions Will Be Listed Below (If Applicable).       Signed, [name] Dresser, MD PhD 08/25/2018 12:56 PM    [facility]

## 2018-08-26 ENCOUNTER — Telehealth: Payer: Self-pay

## 2018-08-26 NOTE — Telephone Encounter (Signed)
Xarelto patient assistance form faxed on 08/26/18.

## 2018-09-01 DIAGNOSIS — I4891 Unspecified atrial fibrillation: Secondary | ICD-10-CM | POA: Diagnosis not present

## 2018-09-08 DIAGNOSIS — B079 Viral wart, unspecified: Secondary | ICD-10-CM | POA: Diagnosis not present

## 2018-09-08 DIAGNOSIS — M25842 Other specified joint disorders, left hand: Secondary | ICD-10-CM | POA: Diagnosis not present

## 2018-09-08 DIAGNOSIS — L608 Other nail disorders: Secondary | ICD-10-CM | POA: Diagnosis not present

## 2018-09-13 ENCOUNTER — Telehealth: Payer: Self-pay

## 2018-09-13 NOTE — Telephone Encounter (Signed)
   [facility] Pre-operative Risk Assessment    Request for surgical clearance:  1. What type of surgery is being performed? Left middle finger DI level amputation  2. When is this surgery scheduled? 10/14/18  3. What type of clearance is required (medical clearance vs. Pharmacy clearance to hold med vs. Both)? Both  4. Are there any medications that need to be held prior to surgery and how long? Hold Xarelto 3 days prior  5. Practice name and name of physician performing surgery? [facility]   Dr.Gramig  6. What is your office phone number [phone]  Attn: Judeen Hammans   7.   What is your office fax number [phone]  8.   Anesthesia type   Local with IV sedation   Kathyrn Lass 09/13/2018, 9:41 AM  _________________________________________________________________   (provider comments below)

## 2018-09-13 NOTE — Telephone Encounter (Signed)
Clinical pharmacist to review Xarelto 

## 2018-09-14 ENCOUNTER — Ambulatory Visit (INDEPENDENT_AMBULATORY_CARE_PROVIDER_SITE_OTHER): Payer: Medicare Other | Admitting: Pulmonary Disease

## 2018-09-14 ENCOUNTER — Encounter: Payer: Self-pay | Admitting: Pulmonary Disease

## 2018-09-14 VITALS — BP 136/80 | HR 78 | Ht 67.0 in | Wt 240.0 lb

## 2018-09-14 DIAGNOSIS — G4733 Obstructive sleep apnea (adult) (pediatric): Secondary | ICD-10-CM

## 2018-09-14 DIAGNOSIS — E669 Obesity, unspecified: Secondary | ICD-10-CM | POA: Diagnosis not present

## 2018-09-14 DIAGNOSIS — G4730 Sleep apnea, unspecified: Secondary | ICD-10-CM

## 2018-09-14 DIAGNOSIS — Z9989 Dependence on other enabling machines and devices: Secondary | ICD-10-CM | POA: Diagnosis not present

## 2018-09-14 DIAGNOSIS — R05 Cough: Secondary | ICD-10-CM

## 2018-09-14 DIAGNOSIS — R059 Cough, unspecified: Secondary | ICD-10-CM

## 2018-09-14 LAB — POCT EXHALED NITRIC OXIDE: FeNO level (ppb): 24

## 2018-09-14 MED ORDER — FLUTICASONE FUROATE 100 MCG/ACT IN AEPB: 1.0000 | INHALATION_SPRAY | Freq: Every day | RESPIRATORY_TRACT | 0 refills | Status: DC

## 2018-09-14 MED ORDER — FLUTICASONE FUROATE 100 MCG/ACT IN AEPB: 1.0000 | INHALATION_SPRAY | Freq: Every day | RESPIRATORY_TRACT | 5 refills | Status: DC

## 2018-09-14 NOTE — Addendum Note (Signed)
Addended by: Annie Paras D on: 09/14/2018 10:39 AM   Modules accepted: Orders

## 2018-09-14 NOTE — Patient Instructions (Signed)
Flonase 1 spray in each nostril nightly Arnuity 1 puff daily, and rinse mouth after each use Albuterol two puffs every 6 hours as needed for cough, wheeze, or chest congestion  Follow up in 2 weeks with Dr. Halford Chessman or Nurse Practitioner

## 2018-09-14 NOTE — Telephone Encounter (Signed)
   Primary Cardiologist: [name], MD  Chart reviewed as part of pre-operative protocol coverage. Patient was contacted 09/14/2018 in reference to pre-operative risk assessment for pending surgery as outlined below.  [name] was last seen on 08/25/2018 by Dr. Harrell Gave.  Since that day, [name] has done well without chest pain or shortness of breath.  Therefore, based on ACC/AHA guidelines, the patient would be at acceptable risk for the planned procedure without further cardiovascular testing.   I will route this recommendation to the requesting party via Epic fax function and remove from pre-op pool.  Please call with questions. Per our clinical pharmacist, patient can hold Xarelto for 3 days prior to procedure.  [place], [place] 09/14/2018, 4:23 PM

## 2018-09-14 NOTE — Progress Notes (Signed)
[facility]  Chief Complaint  Patient presents with  . Follow-up    pt has stuffy nose, cough w/ yellow mucus, couldn't wear CPAP last night due to these symptoms; otherwise uses CPAP, no issues except having issues getting supplies with his DME [facility]) company; needs supplies and mask    Constitutional:  BP 136/80 (BP Location: Right Arm, Cuff Size: Normal)   Pulse 78   Ht 5' 7"  (1.702 m)   Wt 240 lb (108.9 kg)   SpO2 98%   BMI 37.59 kg/m   Past Medical History:  HTN, A fib, HLD, GERD, Ulcerative colitis, Back pain, Nephrolithiasis, IBS, HH, Glaucoma, DVT 2001 and 2002, Diverticulosis, Depression, Colon polyp, Cataracts, Squamous cell skin cancer, BPH, OA  Brief Summary:  [name] is a 68 y.o. male former smoker with obstructive sleep apnea.  Was in hospital in December for new onset a fib.  This happened after he had shoulder surgery and had trouble using a urinal.  He had a condom catheter on and then developed an allergic reaction to this with penis/scrotal swelling.    Around this time he got bronchitis.  He is still having a cough with green sputum.  Was getting some wheeze also.  Not having fever, or chest pain.  Does have some sinus congestion and ear congestion.  Has albuterol, and feels some relief when using this.  Had trouble using CPAP last night due to sinus congestion.  Otherwise CPAP working well.  Has trouble getting supplies from DME.   Physical Exam:   Appearance - well kempt   ENMT - clear nasal mucosa, midline nasal  septum, no oral exudates, no LAN, trachea midline  Respiratory - normal chest wall, normal respiratory effort, no accessory muscle use, no wheeze/rales  CV - s1s2 regular rate and rhythm, no murmurs, no peripheral edema, radial pulses symmetric  GI - soft, non tender, no masses  Lymph - no adenopathy noted in neck and axillary areas  MSK - normal gait  Ext - no cyanosis, clubbing, or joint  inflammation noted  Skin - no rashes, lesions, or ulcers  Neuro - normal strength, oriented x 3  Psych - normal mood and affect    Assessment/Plan:   Obstructive sleep apnea. - he is compliant with therapy and reports benefit - continue auto CPAP - will have his DME arrange for new mask and supplies  Nasal congestion. - he can use flonase and prn Vicks  Cough. - likely from asthmatic bronchitis after recent respiratory infection - don't think he needs prednisone or ABx at this time - will add arnuity 100 one puff daily - continue prn albuterol - if symptoms persist, then will need full PFTs and CXR  Obesity. - discussed importance of weight loss   Patient Instructions  Flonase 1 spray in each nostril nightly Arnuity 1 puff daily, and rinse mouth after each use Albuterol two puffs every 6 hours as needed for cough, wheeze, or chest congestion  Follow up in 2 weeks with Dr. Halford Chessman or Nurse Practitioner    Chesley Mires, MD [place] Pager: [phone] 09/14/2018, 10:34 AM  Flow Sheet     Pulmonary tests:  FeNO 09/14/18 >> 24 Spirometry 09/14/18 >> FEV1 2.4 (83%), FEV1% 83  Sleep tests:  PSG 12/21/91 >> AHI 56 Auto CPAP 08/14/18 to 09/12/18 >> used on 30 of 30 nights with average 7 hrs 26 min.  Average AHI 5.8 with median CPAP 11 and  95 th percentile CPAP 13 cm H2O.  Cardiac tests:  Echo 07/19/18 >> EF 60 to 65%, grade 2 DD   Medications:   Allergies as of 09/14/2018      Reactions   Adhesive [tape] Other (See Comments)   Causes blisters - pls use paper tape      Medication List       Accurate as of September 14, 2018 10:34 AM. Always use your most recent med list.        acetaminophen 500 MG tablet Commonly known as:  TYLENOL Take 1,000 mg by mouth every 6 (six) hours as needed for headache (pain).   albuterol 108 (90 Base) MCG/ACT inhaler Commonly known as:  PROVENTIL HFA;VENTOLIN HFA Inhale 2 puffs into the lungs every 4  (four) hours as needed for wheezing or shortness of breath.   atorvastatin 40 MG tablet Commonly known as:  LIPITOR TAKE 1 TABLET BY MOUTH ONCE DAILY   CALCIUM + D PO Take 1 tablet by mouth at bedtime.   fentaNYL 25 MCG/HR Commonly known as:  DURAGESIC Place 25 mcg onto the skin every 3 (three) days.   Fish Oil 500 MG Caps Take 500 mg by mouth at bedtime.   fluticasone 50 MCG/ACT nasal spray Commonly known as:  FLONASE Place 1 spray into both nostrils at bedtime as needed for allergies or rhinitis.   Fluticasone Furoate 100 MCG/ACT Aepb Commonly known as:  ARNUITY ELLIPTA Inhale 1 puff into the lungs daily.   magnesium oxide 400 MG tablet Commonly known as:  MAG-OX Take 400 mg by mouth at bedtime.   metoprolol tartrate 25 MG tablet Commonly known as:  LOPRESSOR Take 1 tablet (25 mg total) by mouth 4 (four) times daily as needed (heart rate greater than 100).   multivitamin with minerals Tabs tablet Take 1 tablet by mouth at bedtime.   PAZEO 0.7 % Soln Generic drug:  Olopatadine HCl Place 1 drop into both eyes daily at 12 noon.   PRESCRIPTION MEDICATION Inhale into the lungs at bedtime. CPAP   rivaroxaban 20 MG Tabs tablet Commonly known as:  XARELTO Take 1 tablet (20 mg total) by mouth daily with supper for 30 days.   traZODone 50 MG tablet Commonly known as:  DESYREL Take 100 mg by mouth at bedtime.   vortioxetine HBr 10 MG Tabs tablet Commonly known as:  TRINTELLIX Take 1 tablet (10 mg total) by mouth daily.   XELPROS 0.005 % Emul Generic drug:  Latanoprost Place 1 drop into both eyes at bedtime.       Past Surgical History:  He  has a past surgical history that includes right wrist fusion 12/12 - pt wearing brace on the wrist-not started physical therapy yet (Right); cervical fusion 2011--pt has good range of motion neck; bilateral carpal tunnel release 2009; Back surgery (2008 ); nissen fundoplication 0277; Cholecystectomy (1994); Appendectomy (1993);  Transurethral resection of prostate (10/09/2011); Prostate surgery (2014); Spine surgery (2009); mass removed from 2nd finger (Left, aug 2014); uvvvp; Total knee revision (Right, 11/17/2013); Mass excision (Left, 01/04/2014); Joint replacement (Bilateral); XI Robotic assisted inguinal hernia repair with mesh (Bilateral, 07/31/2016); Insertion of mesh (Bilateral, 07/31/2016); Total shoulder arthroplasty (Right, 01/28/2018); thyroid nodule; and Total shoulder arthroplasty (Left, 07/15/2018).  Family History:  His family history includes Arthritis in his mother; Bone cancer in his father; Cancer in his father; Colon cancer in his father; Deep vein thrombosis in his mother; Diabetes in his father, paternal grandfather, and sister; Hearing loss in his  maternal grandmother and mother; Hyperlipidemia in his mother; Hypertension in his mother; Prostate cancer in his father.  Social History:  He  reports that he quit smoking about 35 years ago. His smoking use included cigarettes. He has a 19.00 pack-year smoking history. He has never used smokeless tobacco. He reports current alcohol use of about 6.0 standard drinks of alcohol per week. He reports that he does not use drugs.

## 2018-09-14 NOTE — Telephone Encounter (Signed)
Patient with diagnosis of Afib on xarelto for anticoagulation.  Patient also has a past hx of DVT many years ago.  Procedure: Left middle finger DI level amputation Date of procedure: 10/14/18  CHADS2-VASc score of  2 (CHF, HTN, AGE, DM2, stroke/tia x 2, CAD, AGE, male) Of note, patient does have a history of DVT (1996 and 2001 or 2002). He was only recently put on anticoagulation for new onset afib. No records available as to if these DVT were provoked or unprovoked.  Per office protocol, patient can hold Xarelto for 3 days prior to procedure.

## 2018-09-20 NOTE — Telephone Encounter (Signed)
Contacted patient assistance to check the status of application. Was informed by Rep that application is still pending but will send it off today to be processed. Left message for pt to call back to update.

## 2018-09-20 NOTE — Telephone Encounter (Signed)
Pt updated with status.

## 2018-09-26 NOTE — Telephone Encounter (Signed)
Informed pt that we received a denial letter for patient assistance. Pt states he can not afford Xarleto monthly and asking what he need to do further. Will route to MD.

## 2018-09-26 NOTE — Telephone Encounter (Signed)
If he would prefer, we can arrange for him to establish with our coumadin clinic and use that to manage stroke risk with atrial fibrillation. It will involve blood checks initially more frequently, then usually monthly after that. We can also see if apixaban is more affordable for him, though it is usually similar to xarelto. Thank you.

## 2018-09-26 NOTE — Telephone Encounter (Signed)
Pt updated with Dr. Judeth Cornfield recommendation. Pt states he would like to think about it first and will call back.

## 2018-09-27 NOTE — Progress Notes (Signed)
@Patient  ID: [name], male    DOB: 1950-09-04, 68 y.o.   MRN: [id number]  Chief Complaint  Patient presents with  . Follow-up    Arnuity is helping, not much cough    Referring provider: [name], MD  HPI: 68 year old male, former smoker. PMH significant for OSA, afib, HTN, GERD. Patient of Dr. Halford Chessman, seen recently on 09/14/18. Treated for asthmatic bronchitis after recent respiratory infection. Added Arnuity 100 daily. No abx or prednisone. Needs 2 week follow-up, if symptoms persist recommended PFTs and CXR.   09/28/2018 Patient presents today for 2 week follow-up. Feels his breathing has improved from recently added Arnuity. Cough has mostly resolved. Still has some occasional wheezing and sinus congestion. Using flonase.   Spirometry 09/14/18- FVC 2.9 (74%), FEV1 2.4 (83%), ratio 83 CXR December 2019- No active disease  Allergies  Allergen Reactions  . Adhesive [Tape] Other (See Comments)    Causes blisters - pls use paper tape    Immunization History  Administered Date(s) Administered  . Influenza Split 07/04/2012, 03/03/2013  . Influenza,inj,Quad PF,6+ Mos 05/03/2014, 04/30/2015, 04/03/2016, 04/08/2017, 04/11/2018  . Pneumococcal Conjugate-13 05/03/2014  . Pneumococcal Polysaccharide-23 08/03/2008, 04/03/2016  . Tdap 08/03/2010  . Zoster 08/03/2010    Past Medical History:  Diagnosis Date  . Arthritis    oa and ddd  . Back pain, chronic    pt states he has 3 herniated disks--uses fentanyl patch for pain  . Blood transfusion without reported diagnosis 1950   rH negative"at birth only"  . BPH (benign prostatic hyperplasia)    frequent urination, not able to hold urine well  . 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 Cataracts, both eyes    worst on left  . Colon polyps   . Complication of anesthesia    pt had spinal for a knee replacement--"woke up" during surgery--and sick after surgery  . Depression   . Diverticulosis   . DVT  (deep venous thrombosis) ([place]) 2001 or 2002   right leg-required hospitalization x 8 days  . DVT (deep venous thrombosis) ([place]) 1996   right  . Glaucoma    both eyes  . H/O hiatal hernia   . Hearing impaired person, bilateral    bilateral  . Hemorrhoid   . History of IBS   . History of kidney stones    x 1-passed  . Hyperlipidemia 2012  . Hypertension   . Lumbago   . Lumbar post-laminectomy syndrome   . PONV (postoperative nausea and vomiting)   . Sleep apnea    pt uses cpap - setting of 16  . Ulcer 1966  . Ulcerative colitis ([place])   . Wears glasses   . Wears hearing aid     Tobacco History: Social History   Tobacco Use  Smoking Status Former Smoker  . Packs/day: 1.00  . Years: 19.00  . Pack years: 19.00  . Types: Cigarettes  . Last attempt to quit: 08/04/1983  . Years since quitting: 35.1  Smokeless Tobacco Never Used  Tobacco Comment   quit smoking 1985   Counseling given: Not Answered Comment: quit smoking 1985   Outpatient Medications Prior to Visit  Medication Sig Dispense Refill  . acetaminophen (TYLENOL) 500 MG tablet Take 1,000 mg by mouth every 6 (six) hours as needed for headache (pain).     Marland Kitchen albuterol (PROVENTIL HFA;VENTOLIN HFA) 108 (90 Base) MCG/ACT inhaler Inhale 2 puffs into the lungs every 4 (four) hours  as needed for wheezing or shortness of breath. 1 Inhaler 0  . atorvastatin (LIPITOR) 40 MG tablet TAKE 1 TABLET BY MOUTH ONCE DAILY (Patient taking differently: Take 40 mg by mouth at bedtime. ) 90 tablet 3  . Calcium Carbonate-Vitamin D (CALCIUM + D PO) Take 1 tablet by mouth at bedtime.     . fentaNYL (DURAGESIC - DOSED MCG/HR) 25 MCG/HR Place 25 mcg onto the skin every 3 (three) days.     . fluticasone (FLONASE) 50 MCG/ACT nasal spray Place 1 spray into both nostrils at bedtime as needed for allergies or rhinitis.     . Latanoprost (XELPROS) 0.005 % EMUL Place 1 drop into both eyes at bedtime.    . magnesium oxide (MAG-OX) 400 MG tablet Take 400  mg by mouth at bedtime.     . metoprolol tartrate (LOPRESSOR) 25 MG tablet Take 1 tablet (25 mg total) by mouth 4 (four) times daily as needed (heart rate greater than 100). 180 tablet 3  . Multiple Vitamin (MULTIVITAMIN WITH MINERALS) TABS tablet Take 1 tablet by mouth at bedtime.     . Olopatadine HCl (PAZEO) 0.7 % SOLN Place 1 drop into both eyes daily at 12 noon.     . Omega-3 Fatty Acids (FISH OIL) 500 MG CAPS Take 500 mg by mouth at bedtime.     Marland Kitchen PRESCRIPTION MEDICATION Inhale into the lungs at bedtime. CPAP    . traZODone (DESYREL) 50 MG tablet Take 100 mg by mouth at bedtime.     . vortioxetine HBr (TRINTELLIX) 10 MG TABS Take 1 tablet (10 mg total) by mouth daily. (Patient taking differently: Take 10 mg by mouth at bedtime. ) 14 tablet 0  . Fluticasone Furoate (ARNUITY ELLIPTA) 100 MCG/ACT AEPB Inhale 1 puff into the lungs daily. 30 each 5  . Fluticasone Furoate (ARNUITY ELLIPTA) 100 MCG/ACT AEPB Inhale 1 puff into the lungs daily. 30 each 0  . rivaroxaban (XARELTO) 20 MG TABS tablet Take 1 tablet (20 mg total) by mouth daily with supper for 30 days. 90 tablet 3   No facility-administered medications prior to visit.     Review of Systems  Review of Systems  Constitutional: Negative.   Respiratory: Positive for wheezing. Negative for cough and shortness of breath.   Cardiovascular: Negative.     Physical Exam  BP 118/76 (BP Location: Left Arm, Cuff Size: Normal)   Pulse 76   Ht 5' 7"  (1.702 m)   Wt 238 lb 9.6 oz (108.2 kg)   SpO2 94%   BMI 37.37 kg/m  Physical Exam Constitutional:      General: He is not in acute distress.    Appearance: He is well-developed. He is obese. He is not ill-appearing.  HENT:     Head: Normocephalic and atraumatic.     Right Ear: Tympanic membrane normal.     Left Ear: Tympanic membrane normal.     Nose: Nose normal.     Mouth/Throat:     Mouth: Mucous membranes are moist.     Pharynx: Oropharynx is clear.  Eyes:     Pupils: Pupils are  equal, round, and reactive to light.  Neck:     Musculoskeletal: Normal range of motion and neck supple.  Cardiovascular:     Rate and Rhythm: Normal rate and regular rhythm.     Heart sounds: Normal heart sounds.  Pulmonary:     Effort: Pulmonary effort is normal. No respiratory distress.     Breath sounds: Normal  breath sounds. No wheezing.     Comments: CTA Abdominal:     Comments: Large abd, dist  Skin:    General: Skin is warm and dry.     Findings: No erythema or rash.  Neurological:     Mental Status: He is alert and oriented to person, place, and time.  Psychiatric:        Behavior: Behavior normal.        Judgment: Judgment normal.      Lab Results:  CBC    Component Value Date/Time   WBC 8.3 07/20/2018 0228   RBC 3.68 (L) 07/20/2018 0228   HGB 11.5 (L) 07/20/2018 0228   HCT 33.8 (L) 07/20/2018 0228   PLT 181 07/20/2018 0228   MCV 91.8 07/20/2018 0228   MCH 31.3 07/20/2018 0228   MCHC 34.0 07/20/2018 0228   RDW 12.2 07/20/2018 0228   LYMPHSABS 1.5 07/18/2018 1645   MONOABS 1.4 (H) 07/18/2018 1645   EOSABS 0.4 07/18/2018 1645   BASOSABS 0.0 07/18/2018 1645    BMET    Component Value Date/Time   NA 141 07/20/2018 0228   K 4.0 07/20/2018 0228   CL 106 07/20/2018 0228   CO2 26 07/20/2018 0228   GLUCOSE 122 (H) 07/20/2018 0228   BUN 18 07/20/2018 0228   CREATININE 1.05 07/20/2018 0228   CREATININE 1.13 04/07/2018 0815   CALCIUM 9.0 07/20/2018 0228   GFRNONAA >60 07/20/2018 0228   GFRNONAA 83 04/06/2017 0825   GFRAA >60 07/20/2018 0228   GFRAA >89 04/06/2017 0825    BNP No results found for: BNP  ProBNP No results found for: PROBNP  Imaging: No results found.   Assessment & Plan:   Cough - Resolved - Continue Arnutiy daily as needed - Follow up if symptoms worsen   OSA (obstructive sleep apnea) - Annual follow up   Nasal congestion - Continue Flonase nasal spray   Martyn Ehrich, NP 09/28/2018

## 2018-09-28 ENCOUNTER — Ambulatory Visit (INDEPENDENT_AMBULATORY_CARE_PROVIDER_SITE_OTHER): Payer: Medicare Other | Admitting: Primary Care

## 2018-09-28 ENCOUNTER — Encounter: Payer: Self-pay | Admitting: Primary Care

## 2018-09-28 VITALS — BP 118/76 | HR 76 | Ht 67.0 in | Wt 238.6 lb

## 2018-09-28 DIAGNOSIS — R0981 Nasal congestion: Secondary | ICD-10-CM | POA: Diagnosis not present

## 2018-09-28 DIAGNOSIS — R05 Cough: Secondary | ICD-10-CM

## 2018-09-28 DIAGNOSIS — G4733 Obstructive sleep apnea (adult) (pediatric): Secondary | ICD-10-CM | POA: Diagnosis not present

## 2018-09-28 DIAGNOSIS — R059 Cough, unspecified: Secondary | ICD-10-CM | POA: Insufficient documentation

## 2018-09-28 MED ORDER — FLUTICASONE FUROATE 100 MCG/ACT IN AEPB: 1.0000 | INHALATION_SPRAY | Freq: Every day | RESPIRATORY_TRACT | 5 refills | Status: DC

## 2018-09-28 MED ORDER — FLUTICASONE FUROATE 100 MCG/ACT IN AEPB: 1.0000 | INHALATION_SPRAY | Freq: Every day | RESPIRATORY_TRACT | 0 refills | Status: DC

## 2018-09-28 NOTE — Assessment & Plan Note (Signed)
-   Continue Flonase nasal spray

## 2018-09-28 NOTE — Progress Notes (Signed)
Reviewed and agree with assessment/plan.   Brandi Armato, MD [facility] 07/29/2016, 12:24 PM Pager:  [phone]  

## 2018-09-28 NOTE — Patient Instructions (Addendum)
Continue Arnuity - take 1 puff daily every day   Follow up in 1  Year with Dr. Halford Chessman for OSA  Return sooner if breathing worsens or experiencing increased wheezing

## 2018-09-28 NOTE — Assessment & Plan Note (Signed)
Annual followup

## 2018-09-28 NOTE — Assessment & Plan Note (Signed)
-   Resolved - Continue Arnutiy daily as needed - Follow up if symptoms worsen

## 2018-10-05 DIAGNOSIS — Z4789 Encounter for other orthopedic aftercare: Secondary | ICD-10-CM | POA: Diagnosis not present

## 2018-10-14 ENCOUNTER — Other Ambulatory Visit: Payer: Self-pay | Admitting: Orthopedic Surgery

## 2018-10-14 DIAGNOSIS — L03012 Cellulitis of left finger: Secondary | ICD-10-CM | POA: Diagnosis not present

## 2018-10-14 DIAGNOSIS — D0462 Carcinoma in situ of skin of left upper limb, including shoulder: Secondary | ICD-10-CM | POA: Diagnosis not present

## 2018-10-27 DIAGNOSIS — M79642 Pain in left hand: Secondary | ICD-10-CM | POA: Diagnosis not present

## 2018-11-16 DIAGNOSIS — M65311 Trigger thumb, right thumb: Secondary | ICD-10-CM | POA: Diagnosis not present

## 2018-11-16 DIAGNOSIS — M65321 Trigger finger, right index finger: Secondary | ICD-10-CM | POA: Diagnosis not present

## 2018-11-16 DIAGNOSIS — Z4789 Encounter for other orthopedic aftercare: Secondary | ICD-10-CM | POA: Diagnosis not present

## 2018-11-16 DIAGNOSIS — S63521A Sprain of radiocarpal joint of right wrist, initial encounter: Secondary | ICD-10-CM | POA: Diagnosis not present

## 2019-02-24 ENCOUNTER — Telehealth: Payer: Self-pay | Admitting: Cardiology

## 2019-02-24 NOTE — Telephone Encounter (Signed)
I left a message for pt to call and let me know if he can do a Virtual Visit on 02-27-19 with Dr Harrell Gave or if he wants to reschedule for another day for an Office Visit.

## 2019-02-27 ENCOUNTER — Ambulatory Visit: Payer: Medicare Other | Admitting: Cardiology

## 2019-03-06 DIAGNOSIS — H401131 Primary open-angle glaucoma, bilateral, mild stage: Secondary | ICD-10-CM | POA: Diagnosis not present

## 2019-03-15 ENCOUNTER — Other Ambulatory Visit: Payer: Self-pay | Admitting: Family Medicine

## 2019-04-05 ENCOUNTER — Encounter: Payer: Self-pay | Admitting: Cardiology

## 2019-04-05 ENCOUNTER — Ambulatory Visit (INDEPENDENT_AMBULATORY_CARE_PROVIDER_SITE_OTHER): Payer: Medicare Other | Admitting: Cardiology

## 2019-04-05 ENCOUNTER — Other Ambulatory Visit: Payer: Self-pay

## 2019-04-05 VITALS — BP 130/74 | HR 61 | Temp 97.5°F | Ht 67.0 in | Wt 232.4 lb

## 2019-04-05 DIAGNOSIS — Z7182 Exercise counseling: Secondary | ICD-10-CM

## 2019-04-05 DIAGNOSIS — Z713 Dietary counseling and surveillance: Secondary | ICD-10-CM | POA: Diagnosis not present

## 2019-04-05 DIAGNOSIS — R072 Precordial pain: Secondary | ICD-10-CM | POA: Diagnosis not present

## 2019-04-05 DIAGNOSIS — E7800 Pure hypercholesterolemia, unspecified: Secondary | ICD-10-CM

## 2019-04-05 DIAGNOSIS — I10 Essential (primary) hypertension: Secondary | ICD-10-CM

## 2019-04-05 DIAGNOSIS — I480 Paroxysmal atrial fibrillation: Secondary | ICD-10-CM | POA: Diagnosis not present

## 2019-04-05 DIAGNOSIS — Z01812 Encounter for preprocedural laboratory examination: Secondary | ICD-10-CM

## 2019-04-05 NOTE — Patient Instructions (Addendum)
Medication Instructions:  Your Physician recommend you continue on your current medication as directed.    If you need a refill on your cardiac medications before your next appointment, please call your pharmacy.   Lab work: Your physician recommends that you return for lab work 1 week prior to procedure (BMP).  If you have labs (blood work) drawn today and your tests are completely normal, you will receive your results only by: Marland Kitchen MyChart Message (if you have MyChart) OR . A paper copy in the mail If you have any lab test that is abnormal or we need to change your treatment, we will call you to review the results.  Testing/Procedures: Your physician has requested that you have cardiac CT. Cardiac computed tomography (CT) is a painless test that uses an x-ray machine to take clear, detailed pictures of your heart. For further information please visit [email or web address][facility][email or web address]. Please follow instruction sheet as given.    Follow-Up: At [facility], you and your health needs are our priority.  As part of our continuing mission to provide you with exceptional heart care, we have created designated Provider Care Teams.  These Care Teams include your primary Cardiologist (physician) and Advanced Practice Providers (APPs -  Physician Assistants and Nurse Practitioners) who all work together to provide you with the care you need, when you need it. You will need a follow up appointment in 6 months.  Please call our office 2 months in advance to schedule this appointment.  You may see [name], MD or one of the following Advanced Practice Providers on your designated Care Team:   Rosaria Ferries, PA-C . Jory Sims, DNP, ANP  Your cardiac CT will be scheduled at one of the below locations:   [facility] [address] [place], [place] [address] [phone]  Please follow these instructions carefully (unless otherwise directed):  Hold all erectile dysfunction  medications at least 48 hours prior to test.  On the Night Before the Test: . Be sure to Drink plenty of water. . Do not consume any caffeinated/decaffeinated beverages or chocolate 12 hours prior to your test. . Do not take any antihistamines 12 hours prior to your test.  On the Day of the Test: . Drink plenty of water. Do not drink any water within one hour of the test. . Do not eat any food 4 hours prior to the test. . You may take your regular medications prior to the test.  . Take metoprolol (Lopressor) two hours prior to test.        After the Test: . Drink plenty of water. . After receiving IV contrast, you may experience a mild flushed feeling. This is normal. . On occasion, you may experience a mild rash up to 24 hours after the test. This is not dangerous. If this occurs, you can take Benadryl 25 mg and increase your fluid intake. . If you experience trouble breathing, this can be serious. If it is severe call 911 IMMEDIATELY. If it is mild, please call our office. . If you take any of these medications: Glipizide/Metformin, Avandament, Glucavance, please do not take 48 hours after completing test.    Please contact the cardiac imaging nurse navigator should you have any questions/concerns Marchia Bond, RN Navigator Cardiac [facility] [phone] Office  [phone] Cell

## 2019-04-05 NOTE — Progress Notes (Signed)
Cardiology Office Note:    Date:  04/05/2019   ID:  [name], DOB 1951/05/30, MRN [id number]  PCP:  [name] Frizzle, MD  Cardiologist:  [name], MD PhD  Referring MD: [name] Frizzle, MD   CC: followup  History of Present Illness:    [name] is a 68 y.o. male with a hx of hypertension, hyperlipidemia, prior atrial fibrillation with RVR who is seen in follow up for the evaluation and management of afib, hypertension, hyperlipidemia.  [name] was hospitalized in [id number]/6599 due to complications from urinary catheter placed during recent shoulder surgery, but he was also found to have new onset atrial fibrillation with RVR. He spontaneously converted to NSR. He was discharged on rivaroxaban.  Today: feels lightheaded/dizzy when he bends down and moves his head up quickly. Not new. No syncope. Feels like the room is spinning. Discussed difference between BPPV and positional/orthostatic symptoms, what to watch for.  Has been having chest pain, left sided, mid chest. Not often, about twice a month, lasts about 5 minutes. Mild, goes away on its own. Usually sitting down, not exertional. Sometimes after he eats. Hasn't noticed if it is better with belching or drinking something. Not like prior severe pain he had in the past. No associated symptoms.  Has chronic sweats, not changed. Drinks a lot of fluid, has been working outside in the 90 degree heat.   Can't tell when he is in or out of rhythm. HR usually 60s, sometimes has high rates when he is sleeping >100 bpm on his fitness tracker. Even when he is sweaty and working hard it usually is 85 bpm. Has sleep apnea, uses CPAP and has for years.   Bps have been well controlled, 110s/60s.   Denies shortness of breath at rest or with normal exertion. No PND, orthopnea, LE edema or unexpected weight gain. No syncope or palpitations.  Due for colonoscopy, discussed that we usually hold the blood thinner with this. He has  a history of polyps.  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   Cataracts, both eyes    worst on left   Colon polyps    Complication of anesthesia    pt had spinal for a knee replacement--"woke up" during surgery--and sick after surgery   Depression    Diverticulosis    DVT (deep venous thrombosis) ([place]) 2001 or 2002   right leg-required hospitalization x 8 days   DVT (deep venous thrombosis) ([place]) 1996   right   Glaucoma    both eyes   H/O hiatal hernia    Hearing impaired person, bilateral    bilateral   Hemorrhoid    History of IBS    History of kidney stones    x 1-passed   Hyperlipidemia 2012   Hypertension    Lumbago    Lumbar post-laminectomy syndrome    PONV (postoperative nausea and vomiting)    Sleep apnea    pt uses cpap - setting of 16   Ulcer 1966   Ulcerative colitis ([place])    Wears glasses  Wears hearing aid     Past Surgical History:  Procedure Laterality Date   [place]  2008    bone spur removed    bilateral carpal tunnel release 2009     cervical fusion 2011--pt has good range of motion neck     CHOLECYSTECTOMY  1994   INSERTION OF MESH Bilateral 07/31/2016   Procedure: INSERTION OF MESH;  Surgeon: [name], MD;  Location: [facility];  Service: General;  Laterality: Bilateral;   JOINT REPLACEMENT Bilateral    2001 left total knee and 1985 right total knee   MASS EXCISION Left 01/04/2014   Procedure: LEFT MIDDLE FINGER MASS EXCISION WITH NAILBED REPAIR AND ADVANCEMENT FLAP;  Surgeon: [name], MD;  Location: [facility];  Service:  Orthopedics;  Laterality: Left;   mass removed from 2nd finger Left aug 2014   squamous carcinoma   nissen fundoplication 0350     PROSTATE SURGERY  2014   TURP   right wrist fusion 12/12 - pt wearing brace on the wrist-not started physical therapy yet Right    fusion prevent limited ROM to right wrist   SPINE SURGERY  2009   cervical spine fusion   thyroid nodule     removed   TOTAL KNEE REVISION Right 11/17/2013   Procedure: RIGHT KNEE POLYETHYLENE REVISION, bone graft;  Surgeon: [name], MD;  Location: [facility];  Service: Orthopedics;  Laterality: Right;   TOTAL SHOULDER ARTHROPLASTY Right 01/28/2018   Procedure: RIGHT TOTAL SHOULDER ARTHROPLASTY;  Surgeon: [name], MD;  Location: [facility];  Service: Orthopedics;  Laterality: Right;   TOTAL SHOULDER ARTHROPLASTY Left 07/15/2018   Procedure: LEFT SHOULDER ANATOMIC TOTAL SHOULDER ARTHROPLASTY;  Surgeon: [name], MD;  Location: [facility];  Service: Orthopedics;  Laterality: Left;   TRANSURETHRAL RESECTION OF PROSTATE  10/09/2011   Procedure: TRANSURETHRAL RESECTION OF THE PROSTATE WITH GYRUS INSTRUMENTS;  Surgeon: Fredricka Bonine, MD;  Location: [facility];  Service: Urology;  Laterality: N/A;   uvvvp     XI ROBOTIC ASSISTED INGUINAL HERNIA REPAIR WITH MESH Bilateral 07/31/2016   Procedure: XI ROBOTIC BILATERAL INGUINAL HERNIA REPAIR WITH MESH;  Surgeon: [name], MD;  Location: [facility];  Service: General;  Laterality: Bilateral;    Current Medications: Current Outpatient Medications on File Prior to Visit  Medication Sig   acetaminophen (TYLENOL) 500 MG tablet Take 1,000 mg by mouth every 6 (six) hours as needed for headache (pain).    albuterol (PROVENTIL HFA;VENTOLIN HFA) 108 (90 Base) MCG/ACT inhaler Inhale 2 puffs into the lungs every 4 (four) hours as needed for wheezing or shortness of breath.   atorvastatin (LIPITOR) 40 MG tablet Take 1 tablet by mouth once daily   Calcium Carbonate-Vitamin D  (CALCIUM + D PO) Take 1 tablet by mouth at bedtime.    fentaNYL (DURAGESIC - DOSED MCG/HR) 25 MCG/HR Place 25 mcg onto the skin every 3 (three) days.    fluticasone (FLONASE) 50 MCG/ACT nasal spray Place 1 spray into both nostrils at bedtime as needed for allergies or rhinitis.    Fluticasone Furoate (ARNUITY ELLIPTA) 100 MCG/ACT AEPB Inhale 1 puff into the lungs daily.   Fluticasone Furoate (ARNUITY ELLIPTA) 100 MCG/ACT AEPB Inhale 1 puff into the lungs daily.   Latanoprost (XELPROS) 0.005 % EMUL Place 1 drop into both eyes at bedtime.   magnesium oxide (MAG-OX) 400 MG tablet Take 400 mg by mouth at bedtime.    metoprolol tartrate (LOPRESSOR)  25 MG tablet Take 1 tablet (25 mg total) by mouth 4 (four) times daily as needed (heart rate greater than 100).   Multiple Vitamin (MULTIVITAMIN WITH MINERALS) TABS tablet Take 1 tablet by mouth at bedtime.    Olopatadine HCl (PAZEO) 0.7 % SOLN Place 1 drop into both eyes daily at 12 noon.    Omega-3 Fatty Acids (FISH OIL) 500 MG CAPS Take 500 mg by mouth at bedtime.    PRESCRIPTION MEDICATION Inhale into the lungs at bedtime. CPAP   rivaroxaban (XARELTO) 20 MG TABS tablet Take 1 tablet (20 mg total) by mouth daily with supper for 30 days.   traZODone (DESYREL) 50 MG tablet Take 100 mg by mouth at bedtime.    vortioxetine HBr (TRINTELLIX) 10 MG TABS Take 1 tablet (10 mg total) by mouth daily. (Patient taking differently: Take 10 mg by mouth at bedtime. )   No current facility-administered medications on file prior to visit.      Allergies:   Adhesive [tape]   Social History   Socioeconomic History   Marital status: Married    Spouse name: Not on file   Number of children: Not on file   Years of education: Not on file   Highest education level: Not on file  Occupational History   Occupation: disabled  Social Needs   Financial resource strain: Not on file   Food insecurity    Worry: Not on file    Inability: Not on file    Transportation needs    Medical: Not on file    Non-medical: Not on file  Tobacco Use   Smoking status: Former Smoker    Packs/day: 1.00    Years: 19.00    Pack years: 19.00    Types: Cigarettes    Quit date: 08/04/1983    Years since quitting: 35.6   Smokeless tobacco: Never Used   Tobacco comment: quit smoking 1985  Substance and Sexual Activity   Alcohol use: Yes    Alcohol/week: 6.0 standard drinks    Types: 6 Cans of beer per week    Comment: maybe 2 or 3 beers per week   Drug use: No   Sexual activity: Not Currently  Lifestyle   Physical activity    Days per week: Not on file    Minutes per session: Not on file   Stress: Not on file  Relationships   Social connections    Talks on phone: Not on file    Gets together: Not on file    Attends religious service: Not on file    Active member of club or organization: Not on file    Attends meetings of clubs or organizations: Not on file    Relationship status: Not on file  Other Topics Concern   Not on file  Social History Narrative   Not on file     Family History: The patient's family history includes Arthritis in his mother; Bone cancer in his father; Cancer in his father; Colon cancer in his father; Deep vein thrombosis in his mother; Diabetes in his father, paternal grandfather, and sister; Hearing loss in his maternal grandmother and mother; Hyperlipidemia in his mother; Hypertension in his mother; Prostate cancer in his father.  ROS:   Please see the history of present illness.  Additional pertinent ROS: Constitutional: Negative for chills, fever, night sweats, unintentional weight loss  HENT: Negative for ear pain and hearing loss.   Eyes: Negative for loss of vision and eye pain.  Respiratory: Negative for cough, sputum, wheezing.   Cardiovascular: See HPI. Gastrointestinal: Negative for abdominal pain, melena, and hematochezia.  Genitourinary: Negative for dysuria and hematuria.  Musculoskeletal:  Negative for falls and myalgias.  Skin: Negative for itching and rash.  Neurological: Negative for focal weakness, focal sensory changes and loss of consciousness.  Endo/Heme/Allergies: Does bruise easily.  No significant bleeding.  EKGs/Labs/Other Studies Reviewed:    The following studies were reviewed today: Echo 07/19/18 - Left ventricle: The cavity size was normal. Systolic function was   normal. The estimated ejection fraction was in the range of 60%   to 65%. Wall motion was normal; there were no regional wall   motion abnormalities. Features are consistent with a pseudonormal   left ventricular filling pattern, with concomitant abnormal   relaxation and increased filling pressure (grade 2 diastolic   dysfunction). There was no evidence of elevated ventricular   filling pressure by Doppler parameters. - Aortic root: The aortic root was normal in size. - Left atrium: The atrium was moderately dilated. - Right ventricle: The cavity size was normal. Wall thickness was   normal. Systolic function was normal. - Right atrium: The atrium was normal in size. - Tricuspid valve: There was mild regurgitation. - Pulmonary arteries: Systolic pressure was within the normal   range. - Inferior vena cava: The vessel was dilated. The respirophasic   diameter changes were blunted (< 50%), consistent with elevated   central venous pressure. - Pericardium, extracardiac: There was no pericardial effusion.  MPI 2007--I cannot see complete results  EKG:  EKG is personally reviewed.  The ekg from 08/25/18 demonstrates sinus bradycardia at 55 bpm.  Recent Labs: 07/19/2018: ALT 27; Magnesium 1.9; TSH 0.830 07/20/2018: BUN 18; Creatinine, Ser 1.05; Hemoglobin 11.5; Platelets 181; Potassium 4.0; Sodium 141  Recent Lipid Panel    Component Value Date/Time   CHOL 115 07/19/2018 0323   TRIG 48 07/19/2018 0323   HDL 38 (L) 07/19/2018 0323   CHOLHDL 3.0 07/19/2018 0323   VLDL 10 07/19/2018 0323    LDLCALC 67 07/19/2018 0323   LDLCALC 80 04/07/2018 0815    Physical Exam:    VS:  BP 130/74    Pulse 61    Temp (!) 97.5 F (36.4 C) (Temporal)    Ht 5' 7"  (1.702 m)    Wt 232 lb 6.4 oz (105.4 kg)    SpO2 95%    BMI 36.40 kg/m     Wt Readings from Last 3 Encounters:  04/05/19 232 lb 6.4 oz (105.4 kg)  09/28/18 238 lb 9.6 oz (108.2 kg)  09/14/18 240 lb (108.9 kg)    GEN: Well nourished, well developed in no acute distress HEENT: Normal, moist mucous membranes NECK: No JVD CARDIAC: regular rhythm, normal S1 and S2, no murmurs, rubs, gallops.  VASCULAR: Radial and DP pulses 2+ bilaterally. No carotid bruits RESPIRATORY:  Clear to auscultation without rales, wheezing or rhonchi  ABDOMEN: Soft, non-tender, non-distended MUSCULOSKELETAL:  Ambulates independently SKIN: Warm and dry, no edema NEUROLOGIC:  Alert and oriented x 3. No focal neuro deficits noted. PSYCHIATRIC:  Normal affect   ASSESSMENT:    1. Pre-procedure lab exam   2. Precordial pain   3. Paroxysmal atrial fibrillation (HCC)   4. Essential hypertension   5. Pure hypercholesterolemia   6. Nutritional counseling   7. Exercise counseling    PLAN:    Chest discomfort: new, no known CAD, does have risk factors for CAD. Both typical and atypical components  We spent significant time today reviewing different parts of the cardiovascular system (electrical, vascular, functional, and valvular). We discussed how each of these systems can present with different symptoms. We reviewed that there are different ways we evaluate these symptoms with tests. We reviewed which tests I think are most appropriate given the symptoms, and we discussed risks/benefits and limitations of each of these tests. Please see summary below. We also discussed that if testing is unrevealing for a cardiac cause of the symptoms, there are many noncardiac causes as well that can contribute to symptoms. If the heart is ruled out, then I recommend returning  to PCP to discuss alternative diagnoses.  -discussed treadmill stress, nuclear stress/lexiscan, and CT coronary angiography. Discussed pros and cons of each, including but not limited to false positive/false negative risk, radiation risk, and risk of IV contrast dye. Based on shared decision making, decision was made to pursue CT coronary angiography. -instructed to take a dose of his PRN metoprolol 2 hours prior to test -counseled on need to get BMET one week prior to test -counseled on use of sublingual nitroglycerin and its importance to a good test  Atrial fibrillation, with RVR in hospital and spontaneous conversion: -CHA2DS2/VAS Stroke Risk Points=3 (HTN, HLD, age)  -on rivaroxaban for anticoagulation -regular rhythm today -asymptomatic -has instructions for PRN metoprolol   Hypertension: at goal on no meds -if elevates in the future, did well on lisinopril  Hyperlipidemia: last LDL 67 07/2018 -continue atorvastatin 40 mg  Lifestyle recommendations -recommend heart healthy/Mediterranean diet, with whole grains, fruits, vegetable, fish, lean meats, nuts, and olive oil. Limit salt. -recommend moderate walking, 3-5 times/week for 30-50 minutes each session. Aim for at least 150 minutes.week. Goal should be pace of 3 miles/hours, or walking 1.5 miles in 30 minutes -recommend avoidance of tobacco products. Avoid excess alcohol.  Plan for follow up: 6 mos or sooner PRN based on results of testing  [name], MD, PhD [place]   [facility]   Medication Adjustments/Labs and Tests Ordered: Current medicines are reviewed at length with the patient today.  Concerns regarding medicines are outlined above.  Orders Placed This Encounter  Procedures   CT CORONARY MORPH W/CTA COR W/SCORE W/CA W/CM &/OR WO/CM   CT CORONARY FRACTIONAL FLOW RESERVE DATA PREP   CT CORONARY FRACTIONAL FLOW RESERVE FLUID ANALYSIS   Basic metabolic panel   No orders of the defined types  were placed in this encounter.   Patient Instructions  Medication Instructions:  Your Physician recommend you continue on your current medication as directed.    If you need a refill on your cardiac medications before your next appointment, please call your pharmacy.   Lab work: Your physician recommends that you return for lab work 1 week prior to procedure (BMP).  If you have labs (blood work) drawn today and your tests are completely normal, you will receive your results only by:  [place] (if you have MyChart) OR  A paper copy in the mail If you have any lab test that is abnormal or we need to change your treatment, we will call you to review the results.  Testing/Procedures: Your physician has requested that you have cardiac CT. Cardiac computed tomography (CT) is a painless test that uses an x-ray machine to take clear, detailed pictures of your heart. For further information please visit [facility]uge[facility]iest[facility]tn. Please follow instruction sheet as given.    Follow-Up: At [facility], you and your health needs are our priority.  As  part of our continuing mission to provide you with exceptional heart care, we have created designated Provider Care Teams.  These Care Teams include your primary Cardiologist (physician) and Advanced Practice Providers (APPs -  Physician Assistants and Nurse Practitioners) who all work together to provide you with the care you need, when you need it. You will need a follow up appointment in 6 months.  Please call our office 2 months in advance to schedule this appointment.  You may see [name], MD or one of the following Advanced Practice Providers on your designated Care Team:   Rosaria Ferries, PA-C  Jory Sims, DNP, ANP  Your cardiac CT will be scheduled at one of the below locations:   [facility] [address] [place], [place] [address] [phone]  Please follow these instructions carefully (unless  otherwise directed):  Hold all erectile dysfunction medications at least 48 hours prior to test.  On the Night Before the Test:  Be sure to Drink plenty of water.  Do not consume any caffeinated/decaffeinated beverages or chocolate 12 hours prior to your test.  Do not take any antihistamines 12 hours prior to your test.  On the Day of the Test:  Drink plenty of water. Do not drink any water within one hour of the test.  Do not eat any food 4 hours prior to the test.  You may take your regular medications prior to the test.   Take metoprolol (Lopressor) two hours prior to test.        After the Test:  Drink plenty of water.  After receiving IV contrast, you may experience a mild flushed feeling. This is normal.  On occasion, you may experience a mild rash up to 24 hours after the test. This is not dangerous. If this occurs, you can take Benadryl 25 mg and increase your fluid intake.  If you experience trouble breathing, this can be serious. If it is severe call 911 IMMEDIATELY. If it is mild, please call our office.  If you take any of these medications: Glipizide/Metformin, Avandament, Glucavance, please do not take 48 hours after completing test.    Please contact the cardiac imaging nurse navigator should you have any questions/concerns Marchia Bond, RN Navigator Cardiac Imaging [facility] [phone] Office  [phone] Cell      Signed, [name], MD PhD 04/05/2019 11:50 AM    [place]

## 2019-04-06 ENCOUNTER — Ambulatory Visit (INDEPENDENT_AMBULATORY_CARE_PROVIDER_SITE_OTHER): Payer: Medicare Other

## 2019-04-06 DIAGNOSIS — Z23 Encounter for immunization: Secondary | ICD-10-CM | POA: Diagnosis not present

## 2019-04-06 NOTE — Progress Notes (Signed)
Patient came in to receive annual flu vaccine. Fluad was given in the left deltoid. Patient tolerated well. VIS given

## 2019-04-19 DIAGNOSIS — M65321 Trigger finger, right index finger: Secondary | ICD-10-CM | POA: Diagnosis not present

## 2019-04-19 DIAGNOSIS — M1812 Unilateral primary osteoarthritis of first carpometacarpal joint, left hand: Secondary | ICD-10-CM | POA: Diagnosis not present

## 2019-04-19 DIAGNOSIS — M79642 Pain in left hand: Secondary | ICD-10-CM | POA: Diagnosis not present

## 2019-04-19 DIAGNOSIS — M65311 Trigger thumb, right thumb: Secondary | ICD-10-CM | POA: Diagnosis not present

## 2019-04-19 DIAGNOSIS — M65322 Trigger finger, left index finger: Secondary | ICD-10-CM | POA: Diagnosis not present

## 2019-05-15 ENCOUNTER — Encounter: Payer: Self-pay | Admitting: Family Medicine

## 2019-05-15 ENCOUNTER — Other Ambulatory Visit: Payer: Self-pay

## 2019-05-15 ENCOUNTER — Ambulatory Visit (INDEPENDENT_AMBULATORY_CARE_PROVIDER_SITE_OTHER): Payer: Medicare Other | Admitting: Family Medicine

## 2019-05-15 VITALS — BP 128/82 | HR 68 | Temp 98.9°F | Resp 16 | Ht 67.0 in | Wt 230.0 lb

## 2019-05-15 DIAGNOSIS — Z5181 Encounter for therapeutic drug level monitoring: Secondary | ICD-10-CM | POA: Diagnosis not present

## 2019-05-15 DIAGNOSIS — L239 Allergic contact dermatitis, unspecified cause: Secondary | ICD-10-CM | POA: Diagnosis not present

## 2019-05-15 DIAGNOSIS — Z79899 Other long term (current) drug therapy: Secondary | ICD-10-CM | POA: Diagnosis not present

## 2019-05-15 MED ORDER — PREDNISONE 20 MG PO TABS: ORAL_TABLET | ORAL | 0 refills | Status: DC

## 2019-05-15 MED ORDER — TRIAMCINOLONE ACETONIDE 0.1 % EX CREA: 1.0000 "application " | TOPICAL_CREAM | Freq: Two times a day (BID) | CUTANEOUS | 0 refills | Status: DC

## 2019-05-15 NOTE — Patient Instructions (Signed)
Take the prednisone as prescribed  Use the topical cream Okay to use benadryl  F/U as previous for Physical

## 2019-05-15 NOTE — Progress Notes (Signed)
   Subjective:    Patient ID: [name], male    DOB: 05/13/1951, 68 y.o.   MRN: [id number]  Patient presents for Rash (x4 days- irritation in creases of arms at elbows- only used sunblock on arms while at beach- c/o itching)  Here with rash to bilateral antecubital fossa's that extends towards the upper arms.  He is at the beach he used both the suntan lotion that was all as well as sunscreen but he does recall that he put the sunscreen on his arms inner part as well as across his chest but did not use the lotion everywhere .  Inks that the sunscreen was actually expired.    He does have a breakout across the upper part of his chest as well though this is milder.Marland Kitchen  He did not use it on his back or abdomen his face area.  He has had significant itching since last Wednesday.  He has tried Benadryl cream this is not helped.  He is not feel sick no fever cough congestion.  No joint pain than his typical chronic pain.  He has not had any recent medication changes.   Review Of Systems:  GEN- denies fatigue, fever, weight loss,weakness, recent illness HEENT- denies eye drainage, change in vision, nasal discharge, CVS- denies chest pain, palpitations RESP- denies SOB, cough, wheeze ABD- denies N/V, change in stools, abd pain GU- denies dysuria, hematuria, dribbling, incontinence MSK- denies joint pain, muscle aches, injury Neuro- denies headache, dizziness, syncope, seizure activity       Objective:    BP 128/82   Pulse 68   Temp 98.9 F (37.2 C) (Oral)   Resp 16   Ht 5' 7"  (1.702 m)   Wt 230 lb (104.3 kg)   SpO2 90%   BMI 36.02 kg/m  GEN- NAD, alert and oriented x3 Neck- Supple, no LAD  CVS- RRR, no murmur RESP-CTAB Skin- erythematous small tiny coalesced blistering rash on inner anti cubital fossa ( allergic appearing) , also mild across upper chest No pustules EXT- No edema Pulses- Radial 2+        Assessment & Plan:      Problem List Items Addressed This Visit    None    Visit Diagnoses    Allergic dermatitis    -  Primary   Prednisone taper given, topical TAC ointment as well, no siign of superinfection      Note: This dictation was prepared with Dragon dictation along with smaller phrase technology. Any transcriptional errors that result from this process are unintentional.

## 2019-05-18 DIAGNOSIS — Z01812 Encounter for preprocedural laboratory examination: Secondary | ICD-10-CM | POA: Diagnosis not present

## 2019-05-18 LAB — BASIC METABOLIC PANEL
BUN/Creatinine Ratio: 17 (ref 10–24)
BUN: 17 mg/dL (ref 8–27)
CO2: 25 mmol/L (ref 20–29)
Calcium: 9.6 mg/dL (ref 8.6–10.2)
Chloride: 102 mmol/L (ref 96–106)
Creatinine, Ser: 0.98 mg/dL (ref 0.76–1.27)
GFR calc Af Amer: 90 mL/min/{1.73_m2} (ref >59)
GFR calc non Af Amer: 78 mL/min/{1.73_m2} (ref >59)
Glucose: 95 mg/dL (ref 65–99)
Potassium: 4 mmol/L (ref 3.5–5.2)
Sodium: 139 mmol/L (ref 134–144)

## 2019-05-24 ENCOUNTER — Telehealth (HOSPITAL_COMMUNITY): Payer: Self-pay | Admitting: Emergency Medicine

## 2019-05-24 NOTE — Telephone Encounter (Signed)
Reaching out to patient to offer assistance regarding upcoming cardiac imaging study; pt verbalizes understanding of appt date/time, parking situation and where to check in, pre-test NPO status and medications ordered, and verified current allergies; name and call back number provided for further questions should they arise Jamai Dolce RN Navigator Cardiac Imaging [facility] [facility] [phone] office [phone] cell 

## 2019-05-25 ENCOUNTER — Ambulatory Visit (HOSPITAL_COMMUNITY)
Admission: RE | Admit: 2019-05-25 | Discharge: 2019-05-25 | Disposition: A | Discharge status: Home / Self Care | Payer: Medicare Other | Source: Ambulatory Visit | Attending: Cardiology | Admitting: Cardiology

## 2019-05-25 ENCOUNTER — Other Ambulatory Visit: Payer: Self-pay

## 2019-05-25 ENCOUNTER — Ambulatory Visit (INDEPENDENT_AMBULATORY_CARE_PROVIDER_SITE_OTHER): Payer: Medicare Other | Admitting: Family Medicine

## 2019-05-25 ENCOUNTER — Encounter: Payer: Self-pay | Admitting: Family Medicine

## 2019-05-25 VITALS — BP 130/78 | HR 58 | Temp 97.3°F | Resp 18 | Ht 67.0 in | Wt 230.0 lb

## 2019-05-25 DIAGNOSIS — M5136 Other intervertebral disc degeneration, lumbar region: Secondary | ICD-10-CM | POA: Diagnosis not present

## 2019-05-25 DIAGNOSIS — Z125 Encounter for screening for malignant neoplasm of prostate: Secondary | ICD-10-CM

## 2019-05-25 DIAGNOSIS — I480 Paroxysmal atrial fibrillation: Secondary | ICD-10-CM

## 2019-05-25 DIAGNOSIS — Z96611 Presence of right artificial shoulder joint: Secondary | ICD-10-CM

## 2019-05-25 DIAGNOSIS — I10 Essential (primary) hypertension: Secondary | ICD-10-CM

## 2019-05-25 DIAGNOSIS — R5383 Other fatigue: Secondary | ICD-10-CM | POA: Diagnosis not present

## 2019-05-25 DIAGNOSIS — Z0000 Encounter for general adult medical examination without abnormal findings: Secondary | ICD-10-CM

## 2019-05-25 DIAGNOSIS — E7800 Pure hypercholesterolemia, unspecified: Secondary | ICD-10-CM

## 2019-05-25 DIAGNOSIS — Z0001 Encounter for general adult medical examination with abnormal findings: Secondary | ICD-10-CM

## 2019-05-25 DIAGNOSIS — R072 Precordial pain: Secondary | ICD-10-CM | POA: Insufficient documentation

## 2019-05-25 MED ORDER — NITROGLYCERIN 0.4 MG SL SUBL
SUBLINGUAL_TABLET | SUBLINGUAL | Status: AC
  Filled 2019-05-25: qty 2

## 2019-05-25 MED ORDER — IOHEXOL 350 MG/ML SOLN
100.0000 mL | Freq: Once | INTRAVENOUS | Status: AC | PRN
  Administered 2019-05-25: 14:00:00 100 mL via INTRAVENOUS

## 2019-05-25 MED ORDER — NITROGLYCERIN 0.4 MG SL SUBL
0.8000 mg | SUBLINGUAL_TABLET | Freq: Once | SUBLINGUAL | Status: AC
  Administered 2019-05-25: 0.8 mg via SUBLINGUAL

## 2019-05-25 NOTE — Progress Notes (Signed)
CT scan completed. Tolerated well. D/C home walking, awake and alert. In no distress. 

## 2019-05-25 NOTE — Progress Notes (Signed)
Subjective:    Patient ID: [name], male    DOB: [birth year], 68 y.o.   MRN: [id number]  HPI Patient is here today for complete physical exam.  Patient overall is doing very well.  He does report excessive sweating.  He denies any chest pain or shortness of breath associated with.  In fact he is trying to walk almost on a daily basis up to 10,000 steps and is not having any dyspnea on exertion.  However he states that he sweats profusely for no reason at times.  He also reports fatigue.  He denies any fevers.  The sweating is usually brought on by physical activity.  It sounds physiologic perhaps exacerbated by hormone changes within the body such as low testosterone or potentially medication.  His last colonoscopy was 2017.  This is good until 2027.  He still reports frequent diarrhea however this is a chronic problem and I suspect IBS.  He is not taking Imodium.  He is currently on treatment for depression.  He states that the Trintellix is working well.  He denies any falls or memory loss.  His immunizations are up-to-date.  He is due for prostate cancer screening.  He does have a remote history of smoking.  He quit more than 30 years ago.  He had a CT scan of the abdomen in 2016 that revealed no abdominal aortic aneurysm.  He does not meet criteria for lung cancer screening. Immunization History  Administered Date(s) Administered  . Fluad Quad(high Dose 65+) 04/06/2019  . Influenza Split 07/04/2012, 03/03/2013  . Influenza,inj,Quad PF,6+ Mos 05/03/2014, 04/30/2015, 04/03/2016, 04/08/2017, 04/11/2018  . Influenza,inj,quad, With Preservative 05/12/2017  . Pneumococcal Conjugate-13 05/03/2014  . Pneumococcal Polysaccharide-23 08/03/2008, 04/03/2016  . Tdap 08/03/2010  . Zoster 08/03/2010    Immunization History  Administered Date(s) Administered  . Fluad Quad(high Dose 65+) 04/06/2019  . Influenza Split 07/04/2012, 03/03/2013  . Influenza,inj,Quad PF,6+ Mos 05/03/2014, 04/30/2015,  04/03/2016, 04/08/2017, 04/11/2018  . Influenza,inj,quad, With Preservative 05/12/2017  . Pneumococcal Conjugate-13 05/03/2014  . Pneumococcal Polysaccharide-23 08/03/2008, 04/03/2016  . Tdap 08/03/2010  . Zoster 08/03/2010    Past Medical History:  Diagnosis Date  . Arthritis    oa and ddd  . Back pain, chronic    pt states he has 3 herniated disks--uses fentanyl patch for pain  . Blood transfusion without reported diagnosis 1950   rH negative"at birth only"  . BPH (benign prostatic hyperplasia)    frequent urination, not able to hold urine well  . 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 Cataracts, both eyes    worst on left  . Colon polyps   . Complication of anesthesia    pt had spinal for a knee replacement--"woke up" during surgery--and sick after surgery  . Depression   . Diverticulosis   . DVT (deep venous thrombosis) ([place]) 2001 or 2002   right leg-required hospitalization x 8 days  . DVT (deep venous thrombosis) ([place]) 1996   right  . Glaucoma    both eyes  . H/O hiatal hernia   . Hearing impaired person, bilateral    bilateral  . Hemorrhoid   . History of IBS   . History of kidney stones    x 1-passed  . Hyperlipidemia 2012  . Hypertension   . Lumbago   . Lumbar post-laminectomy syndrome   . PONV (postoperative nausea and vomiting)   . Sleep apnea    pt uses cpap - setting of  16  . Ulcer 1966  . Ulcerative colitis ([place])   . Wears glasses   . Wears hearing aid    Past Surgical History:  Procedure Laterality Date  . APPENDECTOMY  1993  . BACK SURGERY  2008    bone spur removed   . bilateral carpal tunnel release 2009    . cervical fusion 2011--pt has good range of motion neck    . CHOLECYSTECTOMY  1994  . INSERTION OF MESH Bilateral 07/31/2016   Procedure: INSERTION OF MESH;  Surgeon: [name], MD;  Location: [facility];  Service: General;  Laterality: Bilateral;  . JOINT REPLACEMENT Bilateral    2001 left total knee and 1985 right  total knee  . MASS EXCISION Left 01/04/2014   Procedure: LEFT MIDDLE FINGER MASS EXCISION WITH NAILBED REPAIR AND ADVANCEMENT FLAP;  Surgeon: [name], MD;  Location: [facility];  Service: Orthopedics;  Laterality: Left;  . mass removed from 2nd finger Left aug 2014   squamous carcinoma  . nissen fundoplication 1324    . PROSTATE SURGERY  2014   TURP  . right wrist fusion 12/12 - pt wearing brace on the wrist-not started physical therapy yet Right    fusion prevent limited ROM to right wrist  . SPINE SURGERY  2009   cervical spine fusion  . thyroid nodule     removed  . TOTAL KNEE REVISION Right 11/17/2013   Procedure: RIGHT KNEE POLYETHYLENE REVISION, bone graft;  Surgeon: [name], MD;  [facility];  Service: Orthopedics;  Laterality: Right;  . TOTAL SHOULDER ARTHROPLASTY Right 01/28/2018   Procedure: RIGHT TOTAL SHOULDER ARTHROPLASTY;  Surgeon: [name], MD;  Location: [facility];  Service: Orthopedics;  Laterality: Right;  . TOTAL SHOULDER ARTHROPLASTY Left 07/15/2018   Procedure: LEFT SHOULDER ANATOMIC TOTAL SHOULDER ARTHROPLASTY;  Surgeon: [name], MD;  Location: [facility];  Service: Orthopedics;  Laterality: Left;  . TRANSURETHRAL RESECTION OF PROSTATE  10/09/2011   Procedure: TRANSURETHRAL RESECTION OF THE PROSTATE WITH GYRUS INSTRUMENTS;  Surgeon: Fredricka Bonine, MD;  [facility];  Service: Urology;  Laterality: N/A;  . uvvvp    . XI ROBOTIC ASSISTED INGUINAL HERNIA REPAIR WITH MESH Bilateral 07/31/2016   Procedure: XI ROBOTIC BILATERAL INGUINAL HERNIA REPAIR WITH MESH;  Surgeon: [name], MD;  [facility];  Service: General;  Laterality: Bilateral;   Current Outpatient Medications on File Prior to Visit  Medication Sig Dispense Refill  . acetaminophen (TYLENOL) 500 MG tablet Take 1,000 mg by mouth every 6 (six) hours as needed for headache (pain).     Marland Kitchen albuterol (PROVENTIL HFA;VENTOLIN HFA) 108 (90 Base) MCG/ACT inhaler  Inhale 2 puffs into the lungs every 4 (four) hours as needed for wheezing or shortness of breath. 1 Inhaler 0  . atorvastatin (LIPITOR) 40 MG tablet Take 1 tablet by mouth once daily 90 tablet 3  . Calcium Carbonate-Vitamin D (CALCIUM + D PO) Take 1 tablet by mouth at bedtime.     . fentaNYL (DURAGESIC - DOSED MCG/HR) 25 MCG/HR Place 25 mcg onto the skin every 3 (three) days.     . fluticasone (FLONASE) 50 MCG/ACT nasal spray Place 1 spray into both nostrils at bedtime as needed for allergies or rhinitis.     . Fluticasone Furoate (ARNUITY ELLIPTA) 100 MCG/ACT AEPB Inhale 1 puff into the lungs daily. 30 each 5  . magnesium oxide (MAG-OX) 400 MG tablet Take 400 mg by mouth at bedtime.     Marland Kitchen  Multiple Vitamin (MULTIVITAMIN WITH MINERALS) TABS tablet Take 1 tablet by mouth at bedtime.     . Omega-3 Fatty Acids (FISH OIL) 500 MG CAPS Take 500 mg by mouth at bedtime.     Marland Kitchen PRESCRIPTION MEDICATION Inhale into the lungs at bedtime. CPAP    . rivaroxaban (XARELTO) 20 MG TABS tablet Take 1 tablet (20 mg total) by mouth daily with supper for 30 days. 90 tablet 3  . traZODone (DESYREL) 50 MG tablet Take 100 mg by mouth at bedtime.     . triamcinolone cream (KENALOG) 0.1 % Apply 1 application topically 2 (two) times daily. 30 g 0  . vortioxetine HBr (TRINTELLIX) 10 MG TABS Take 1 tablet (10 mg total) by mouth daily. (Patient taking differently: Take 10 mg by mouth at bedtime. ) 14 tablet 0   No current facility-administered medications on file prior to visit.    Allergies  Allergen Reactions  . Adhesive [Tape] Other (See Comments)    Causes blisters - pls use paper tape   Social History   Socioeconomic History  . Marital status: Married    Spouse name: Not on file  . Number of children: Not on file  . Years of education: Not on file  . Highest education level: Not on file  Occupational History  . Occupation: disabled  Social Needs  . Financial resource strain: Not on file  . Food insecurity     Worry: Not on file    Inability: Not on file  . Transportation needs    Medical: Not on file    Non-medical: Not on file  Tobacco Use  . Smoking status: Former Smoker    Packs/day: 1.00    Years: 19.00    Pack years: 19.00    Types: Cigarettes    Quit date: 08/04/1983    Years since quitting: 35.8  . Smokeless tobacco: Never Used  . Tobacco comment: quit smoking 1985  Substance and Sexual Activity  . Alcohol use: Yes    Alcohol/week: 6.0 standard drinks    Types: 6 Cans of beer per week    Comment: maybe 2 or 3 beers per week  . Drug use: No  . Sexual activity: Not Currently  Lifestyle  . Physical activity    Days per week: Not on file    Minutes per session: Not on file  . Stress: Not on file  Relationships  . Social Herbalist on phone: Not on file    Gets together: Not on file    Attends religious service: Not on file    Active member of club or organization: Not on file    Attends meetings of clubs or organizations: Not on file    Relationship status: Not on file  . Intimate partner violence    Fear of current or ex partner: Not on file    Emotionally abused: Not on file    Physically abused: Not on file    Forced sexual activity: Not on file  Other Topics Concern  . Not on file  Social History Narrative  . Not on file   Family History  Problem Relation Age of Onset  . Colon cancer Father   . Bone cancer Father        deceased  . Cancer Father   . Diabetes Father   . Prostate cancer Father   . Arthritis Mother   . Hearing loss Mother   . Hyperlipidemia Mother   . Hypertension Mother   .  Deep vein thrombosis Mother   . Diabetes Sister   . Hearing loss Maternal Grandmother   . Diabetes Paternal Grandfather      Review of Systems  All other systems reviewed and are negative.      Objective:   Physical Exam  Constitutional: He is oriented to person, place, and time. He appears well-developed and well-nourished. No distress.  HENT:   Head: Normocephalic and atraumatic.  Right Ear: External ear normal.  Left Ear: External ear normal.  Nose: Nose normal.  Mouth/Throat: Oropharynx is clear and moist. No oropharyngeal exudate.  Eyes: Pupils are equal, round, and reactive to light. Conjunctivae and EOM are normal. Right eye exhibits no discharge. Left eye exhibits no discharge. No scleral icterus.  Neck: Normal range of motion. Neck supple. No JVD present. No tracheal deviation present. No thyromegaly present.  Cardiovascular: Normal rate, regular rhythm, normal heart sounds and intact distal pulses. Exam reveals no gallop and no friction rub.  No murmur heard. Pulmonary/Chest: Effort normal and breath sounds normal. No stridor. No respiratory distress. He has no wheezes. He has no rales. He exhibits no tenderness.  Abdominal: Soft. Bowel sounds are normal. He exhibits no distension and no mass. There is no abdominal tenderness. There is no rebound and no guarding.  Musculoskeletal: Normal range of motion.        General: No tenderness, deformity or edema.  Lymphadenopathy:    He has no cervical adenopathy.  Neurological: He is alert and oriented to person, place, and time. He has normal reflexes. No cranial nerve deficit. He exhibits normal muscle tone. Coordination normal.  Skin: Skin is warm. No rash noted. He is not diaphoretic. No erythema. No pallor.  Psychiatric: He has a normal mood and affect. His behavior is normal. Judgment and thought content normal.  Vitals reviewed.         Assessment & Plan:  Routine general medical examination at a health care facility  Paroxysmal atrial fibrillation (HCC)  Benign essential HTN - Plan: Lipid panel, COMPLETE METABOLIC PANEL WITH GFR  Pure hypercholesterolemia  S/P shoulder replacement, right  Other fatigue - Plan: Testosterone, TSH, CBC with Differential/Platelet  Prostate cancer screening - Plan: PSA  Physical exam today is normal.  Blood pressure is  outstanding.  The patient denies any falls or memory loss.  His depression is controlled on Trintellix.  I will check a CBC, CMP, fasting lipid panel.  Ideally I like his LDL cholesterol to be below 100.  Given his sweating and fatigue I will check a testosterone level along with a TSH and a CBC.  However I believe this is most likely physiologic.  I will screen for prostate cancer with a PSA.  Colon cancer screening is up-to-date.  AAA screening is up-to-date.  Immunizations are up-to-date.  I did recommend Shingrix when available

## 2019-05-26 LAB — LIPID PANEL
Cholesterol: 148 mg/dL (ref <200)
HDL: 58 mg/dL (ref >=40)
LDL Cholesterol (Calc): 75 mg/dL (calc)
Non-HDL Cholesterol (Calc): 90 mg/dL (calc) (ref <130)
Total CHOL/HDL Ratio: 2.6 (calc) (ref <5.0)
Triglycerides: 72 mg/dL (ref <150)

## 2019-05-26 LAB — CBC WITH DIFFERENTIAL/PLATELET
Absolute Monocytes: 610 cells/uL (ref 200–950)
Basophils Absolute: 42 cells/uL (ref 0–200)
Basophils Relative: 0.8 %
Eosinophils Absolute: 228 cells/uL (ref 15–500)
Eosinophils Relative: 4.3 %
HCT: 46.8 % (ref 38.5–50.0)
Hemoglobin: 16 g/dL (ref 13.2–17.1)
Lymphs Abs: 1240 cells/uL (ref 850–3900)
MCH: 32.5 pg (ref 27.0–33.0)
MCHC: 34.2 g/dL (ref 32.0–36.0)
MCV: 95.1 fL (ref 80.0–100.0)
MPV: 10.2 fL (ref 7.5–12.5)
Monocytes Relative: 11.5 %
Neutro Abs: 3180 cells/uL (ref 1500–7800)
Neutrophils Relative %: 60 %
Platelets: 175 10*3/uL (ref 140–400)
RBC: 4.92 10*6/uL (ref 4.20–5.80)
RDW: 13 % (ref 11.0–15.0)
Total Lymphocyte: 23.4 %
WBC: 5.3 10*3/uL (ref 3.8–10.8)

## 2019-05-26 LAB — COMPLETE METABOLIC PANEL WITH GFR
AG Ratio: 1.7 (calc) (ref 1.0–2.5)
ALT: 38 U/L (ref 9–46)
AST: 25 U/L (ref 10–35)
Albumin: 4.3 g/dL (ref 3.6–5.1)
Alkaline phosphatase (APISO): 65 U/L (ref 35–144)
BUN: 14 mg/dL (ref 7–25)
CO2: 27 mmol/L (ref 20–32)
Calcium: 9.6 mg/dL (ref 8.6–10.3)
Chloride: 103 mmol/L (ref 98–110)
Creat: 0.96 mg/dL (ref 0.70–1.18)
GFR, Est African American: 92 mL/min/{1.73_m2} (ref >=60)
GFR, Est Non African American: 80 mL/min/{1.73_m2} (ref >=60)
Globulin: 2.5 g/dL (calc) (ref 1.9–3.7)
Glucose, Bld: 109 mg/dL — ABNORMAL HIGH (ref 65–99)
Potassium: 4.6 mmol/L (ref 3.5–5.3)
Sodium: 141 mmol/L (ref 135–146)
Total Bilirubin: 0.8 mg/dL (ref 0.2–1.2)
Total Protein: 6.8 g/dL (ref 6.1–8.1)

## 2019-05-26 LAB — TESTOSTERONE: Testosterone: 341 ng/dL (ref 250–827)

## 2019-05-26 LAB — PSA: PSA: 1.7 ng/mL (ref <=4.0)

## 2019-05-26 LAB — TSH: TSH: 3.48 mIU/L (ref 0.40–4.50)

## 2019-05-29 ENCOUNTER — Telehealth: Payer: Self-pay

## 2019-05-29 DIAGNOSIS — H401131 Primary open-angle glaucoma, bilateral, mild stage: Secondary | ICD-10-CM | POA: Diagnosis not present

## 2019-05-29 DIAGNOSIS — H43813 Vitreous degeneration, bilateral: Secondary | ICD-10-CM | POA: Diagnosis not present

## 2019-05-29 DIAGNOSIS — H2513 Age-related nuclear cataract, bilateral: Secondary | ICD-10-CM | POA: Diagnosis not present

## 2019-05-29 DIAGNOSIS — H1712 Central corneal opacity, left eye: Secondary | ICD-10-CM | POA: Diagnosis not present

## 2019-05-29 NOTE — Telephone Encounter (Signed)
Patient notified of clearance via MyChart

## 2019-05-29 NOTE — Telephone Encounter (Signed)
Can you please comment on anticoagulation for goniotomy?

## 2019-05-29 NOTE — Telephone Encounter (Signed)
Pt takes Xarelto for afib with CHADS2VASc score of 2 (age, HTN). Also with hx of DVT in 1996 and 2001. Started on Xarelto in 2019 after new dx of afib. Renal function is normal. Ok to hold Xarelto for 1-2 days prior to procedure if needed.

## 2019-05-29 NOTE — Telephone Encounter (Signed)
   Primary Cardiologist: [name], MD  Chart reviewed as part of pre-operative protocol coverage. Cataract extractions are recognized in guidelines as low risk surgeries that do not typically require specific preoperative testing.   Per our clinical pharmacist: Pt takes Xarelto for afib with CHADS2VASc score of 2 (age, HTN). Also with hx of DVT in 1996 and 2001. Started on Xarelto in 2019 after new dx of afib. Renal function is normal. Ok to hold Xarelto for 1-2 days prior to procedure if needed.  Therefore, given past medical history and time since last visit, based on ACC/AHA guidelines, [name] would be at acceptable risk for the planned procedure without further cardiovascular testing.   I will route this recommendation to the requesting party via Epic fax function and remove from pre-op pool.  Please call with questions.  [place], PA 05/29/2019, 4:54 PM

## 2019-05-29 NOTE — Telephone Encounter (Signed)
   [facility] Pre-operative Risk Assessment    Request for surgical clearance:  1. What type of surgery is being performed? Cataract Extraction OS [place] Goniotomy  2. When is this surgery scheduled? 07/26/2019  3. What type of clearance is required (medical clearance vs. Pharmacy clearance to hold med vs. Both)? Both  4. Are there any medications that need to be held prior to surgery and how long? Xarelto   5. Practice name and name of physician performing surgery? [facility]   6. What is your office phone number? [phone]   7.   What is your office fax number? [phone]  8.   Anesthesia type (None, local, MAC, general) ? MAC   [name] 05/29/2019, 4:07 PM  _________________________________________________________________   (provider comments below)

## 2019-06-07 DIAGNOSIS — M79642 Pain in left hand: Secondary | ICD-10-CM | POA: Diagnosis not present

## 2019-06-07 DIAGNOSIS — M25531 Pain in right wrist: Secondary | ICD-10-CM | POA: Diagnosis not present

## 2019-06-07 DIAGNOSIS — M65311 Trigger thumb, right thumb: Secondary | ICD-10-CM | POA: Diagnosis not present

## 2019-06-07 DIAGNOSIS — M65322 Trigger finger, left index finger: Secondary | ICD-10-CM | POA: Diagnosis not present

## 2019-06-07 DIAGNOSIS — M65321 Trigger finger, right index finger: Secondary | ICD-10-CM | POA: Diagnosis not present

## 2019-06-21 ENCOUNTER — Telehealth: Payer: Self-pay | Admitting: Family Medicine

## 2019-06-21 NOTE — Telephone Encounter (Signed)
Samples of  Trintellix 10 mg  were given to the patient, quantity 2, Lot Number [id number]  Samples of Trintellix 10 mg  were given to the patient, quantity 2 , Lot Number [id number]

## 2019-07-05 DIAGNOSIS — M5136 Other intervertebral disc degeneration, lumbar region: Secondary | ICD-10-CM | POA: Diagnosis not present

## 2019-07-05 DIAGNOSIS — M47896 Other spondylosis, lumbar region: Secondary | ICD-10-CM | POA: Diagnosis not present

## 2019-07-05 DIAGNOSIS — M5416 Radiculopathy, lumbar region: Secondary | ICD-10-CM | POA: Diagnosis not present

## 2019-07-06 DIAGNOSIS — H2512 Age-related nuclear cataract, left eye: Secondary | ICD-10-CM | POA: Diagnosis not present

## 2019-07-06 DIAGNOSIS — H401121 Primary open-angle glaucoma, left eye, mild stage: Secondary | ICD-10-CM | POA: Diagnosis not present

## 2019-07-10 ENCOUNTER — Other Ambulatory Visit: Payer: Self-pay | Admitting: Orthopaedic Surgery

## 2019-07-10 DIAGNOSIS — M5136 Other intervertebral disc degeneration, lumbar region: Secondary | ICD-10-CM

## 2019-07-13 DIAGNOSIS — Z96612 Presence of left artificial shoulder joint: Secondary | ICD-10-CM | POA: Diagnosis not present

## 2019-07-13 DIAGNOSIS — Z471 Aftercare following joint replacement surgery: Secondary | ICD-10-CM | POA: Diagnosis not present

## 2019-07-13 DIAGNOSIS — Z96611 Presence of right artificial shoulder joint: Secondary | ICD-10-CM | POA: Diagnosis not present

## 2019-07-31 ENCOUNTER — Encounter: Payer: Self-pay | Admitting: Family Medicine

## 2019-07-31 ENCOUNTER — Ambulatory Visit (INDEPENDENT_AMBULATORY_CARE_PROVIDER_SITE_OTHER): Payer: Medicare Other | Admitting: Family Medicine

## 2019-07-31 ENCOUNTER — Other Ambulatory Visit: Payer: Self-pay

## 2019-07-31 VITALS — BP 110/72 | HR 75 | Temp 98.7°F | Resp 16 | Ht 67.0 in | Wt 239.0 lb

## 2019-07-31 DIAGNOSIS — M6530 Trigger finger, unspecified finger: Secondary | ICD-10-CM | POA: Diagnosis not present

## 2019-07-31 NOTE — Progress Notes (Signed)
Subjective:    Patient ID: [name], male    DOB: 01-29-1951, 69 y.o.   MRN: [id number]  HPI Patient is here today reporting pain in his hand.  He has a trigger finger in his right thumb.  There is a locking palpable nodule felt on the palmar surface of the first MCP joint whenever he flexes and extends his thumb.  He also reports a locking sensation at the second MCP joint with flexion and extension of the index finger.  He reports pain with range of motion in these 2 fingers.  He also complains of pain in the PIP and DIP joints on both hands.  He is unable to take arthritis medication due to the Xarelto that he takes for paroxysmal atrial fibrillation.  He does take Tylenol however this provides some very little relief.  He is already on fentanyl 25 mcg daily for his back and he is hesitant to go up on this any further because of the dangers of taking this medication.  He has tried Voltaren cream without any benefit. Past Medical History:  Diagnosis Date  . Arthritis    oa and ddd  . Back pain, chronic    pt states he has 3 herniated disks--uses fentanyl patch for pain  . Blood transfusion without reported diagnosis 1950   rH negative"at birth only"  . BPH (benign prostatic hyperplasia)    frequent urination, not able to hold urine well  . 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 Cataracts, both eyes    worst on left  . Colon polyps   . Complication of anesthesia    pt had spinal for a knee replacement--"woke up" during surgery--and sick after surgery  . Depression   . Diverticulosis   . DVT (deep venous thrombosis) ([place]) 2001 or 2002   right leg-required hospitalization x 8 days  . DVT (deep venous thrombosis) ([place]) 1996   right  . Glaucoma    both eyes  . H/O hiatal hernia   . Hearing impaired person, bilateral    bilateral  . Hemorrhoid   . History of IBS   . History of kidney stones    x 1-passed  . Hyperlipidemia 2012  . Hypertension   . Lumbago   .  Lumbar post-laminectomy syndrome   . PONV (postoperative nausea and vomiting)   . Sleep apnea    pt uses cpap - setting of 16  . Ulcer 1966  . Ulcerative colitis ([place])   . Wears glasses   . Wears hearing aid    Past Surgical History:  Procedure Laterality Date  . APPENDECTOMY  1993  . BACK SURGERY  2008    bone spur removed   . bilateral carpal tunnel release 2009    . cervical fusion 2011--pt has good range of motion neck    . CHOLECYSTECTOMY  1994  . INSERTION OF MESH Bilateral 07/31/2016   Procedure: INSERTION OF MESH;  Surgeon: [name], MD;  [facility];  Service: General;  Laterality: Bilateral;  . JOINT REPLACEMENT Bilateral    2001 left total knee and 1985 right total knee  . MASS EXCISION Left 01/04/2014   Procedure: LEFT MIDDLE FINGER MASS EXCISION WITH NAILBED REPAIR AND ADVANCEMENT FLAP;  Surgeon: [name], MD;  Location: [facility];  Service: Orthopedics;  Laterality: Left;  . mass removed from 2nd finger Left aug 2014   squamous carcinoma  . nissen fundoplication 1308    .  PROSTATE SURGERY  2014   TURP  . right wrist fusion 12/12 - pt wearing brace on the wrist-not started physical therapy yet Right    fusion prevent limited ROM to right wrist  . SPINE SURGERY  2009   cervical spine fusion  . thyroid nodule     removed  . TOTAL KNEE REVISION Right 11/17/2013   Procedure: RIGHT KNEE POLYETHYLENE REVISION, bone graft;  Surgeon: [name], MD;  [facility];  Service: Orthopedics;  Laterality: Right;  . TOTAL SHOULDER ARTHROPLASTY Right 01/28/2018   Procedure: RIGHT TOTAL SHOULDER ARTHROPLASTY;  Surgeon: [name], MD;  Location: [facility];  Service: Orthopedics;  Laterality: Right;  . TOTAL SHOULDER ARTHROPLASTY Left 07/15/2018   Procedure: LEFT SHOULDER ANATOMIC TOTAL SHOULDER ARTHROPLASTY;  Surgeon: [name], MD;  Location: [facility];  Service: Orthopedics;  Laterality: Left;  . TRANSURETHRAL RESECTION OF PROSTATE  10/09/2011     Procedure: TRANSURETHRAL RESECTION OF THE PROSTATE WITH GYRUS INSTRUMENTS;  Surgeon: Fredricka Bonine, MD;  [facility];  Service: Urology;  Laterality: N/A;  . uvvvp    . XI ROBOTIC ASSISTED INGUINAL HERNIA REPAIR WITH MESH Bilateral 07/31/2016   Procedure: XI ROBOTIC BILATERAL INGUINAL HERNIA REPAIR WITH MESH;  Surgeon: [name], MD;  [facility];  Service: General;  Laterality: Bilateral;   Current Outpatient Medications on File Prior to Visit  Medication Sig Dispense Refill  . acetaminophen (TYLENOL) 500 MG tablet Take 1,000 mg by mouth every 6 (six) hours as needed for headache (pain).     Marland Kitchen albuterol (PROVENTIL HFA;VENTOLIN HFA) 108 (90 Base) MCG/ACT inhaler Inhale 2 puffs into the lungs every 4 (four) hours as needed for wheezing or shortness of breath. 1 Inhaler 0  . atorvastatin (LIPITOR) 40 MG tablet Take 1 tablet by mouth once daily 90 tablet 3  . Calcium Carbonate-Vitamin D (CALCIUM + D PO) Take 1 tablet by mouth at bedtime.     . fentaNYL (DURAGESIC - DOSED MCG/HR) 25 MCG/HR Place 25 mcg onto the skin every 3 (three) days.     . fluticasone (FLONASE) 50 MCG/ACT nasal spray Place 1 spray into both nostrils at bedtime as needed for allergies or rhinitis.     . Fluticasone Furoate (ARNUITY ELLIPTA) 100 MCG/ACT AEPB Inhale 1 puff into the lungs daily. 30 each 5  . magnesium oxide (MAG-OX) 400 MG tablet Take 400 mg by mouth at bedtime.     . Multiple Vitamin (MULTIVITAMIN WITH MINERALS) TABS tablet Take 1 tablet by mouth at bedtime.     . Omega-3 Fatty Acids (FISH OIL) 500 MG CAPS Take 500 mg by mouth at bedtime.     Marland Kitchen PRESCRIPTION MEDICATION Inhale into the lungs at bedtime. CPAP    . traZODone (DESYREL) 50 MG tablet Take 100 mg by mouth at bedtime.     . vortioxetine HBr (TRINTELLIX) 10 MG TABS Take 1 tablet (10 mg total) by mouth daily. (Patient taking differently: Take 10 mg by mouth at bedtime. ) 14 tablet 0  . rivaroxaban (XARELTO) 20 MG TABS tablet Take 1  tablet (20 mg total) by mouth daily with supper for 30 days. 90 tablet 3   No current facility-administered medications on file prior to visit.   Allergies  Allergen Reactions  . Adhesive [Tape] Other (See Comments)    Causes blisters - pls use paper tape   Social History   Socioeconomic History  . Marital status: Married    Spouse name: Not on file  .  Number of children: Not on file  . Years of education: Not on file  . Highest education level: Not on file  Occupational History  . Occupation: disabled  Tobacco Use  . Smoking status: Former Smoker    Packs/day: 1.00    Years: 19.00    Pack years: 19.00    Types: Cigarettes    Quit date: 08/04/1983    Years since quitting: 36.0  . Smokeless tobacco: Never Used  . Tobacco comment: quit smoking 1985  Substance and Sexual Activity  . Alcohol use: Yes    Alcohol/week: 6.0 standard drinks    Types: 6 Cans of beer per week    Comment: maybe 2 or 3 beers per week  . Drug use: No  . Sexual activity: Not Currently  Other Topics Concern  . Not on file  Social History Narrative  . Not on file   Social Determinants of Health   Financial Resource Strain:   . Difficulty of Paying Living Expenses: Not on file  Food Insecurity:   . Worried About Charity fundraiser in the Last Year: Not on file  . Ran Out of Food in the Last Year: Not on file  Transportation Needs:   . Lack of Transportation (Medical): Not on file  . Lack of Transportation (Non-Medical): Not on file  Physical Activity:   . Days of Exercise per Week: Not on file  . Minutes of Exercise per Session: Not on file  Stress:   . Feeling of Stress : Not on file  Social Connections:   . Frequency of Communication with Friends and Family: Not on file  . Frequency of Social Gatherings with Friends and Family: Not on file  . Attends Religious Services: Not on file  . Active Member of Clubs or Organizations: Not on file  . Attends Archivist Meetings: Not on  file  . Marital Status: Not on file  Intimate Partner Violence:   . Fear of Current or Ex-Partner: Not on file  . Emotionally Abused: Not on file  . Physically Abused: Not on file  . Sexually Abused: Not on file      Review of Systems  All other systems reviewed and are negative.      Objective:   Physical Exam Vitals reviewed.  Constitutional:      Appearance: Normal appearance.  Cardiovascular:     Rate and Rhythm: Normal rate and regular rhythm.  Pulmonary:     Effort: Pulmonary effort is normal.     Breath sounds: Normal breath sounds.  Musculoskeletal:     Right hand: Tenderness and bony tenderness present. Decreased range of motion.     Left hand: Tenderness and bony tenderness present. Decreased range of motion.  Neurological:     Mental Status: He is alert.           Assessment & Plan:  Trigger finger of right hand, unspecified finger  Patient has a combination of trigger fingers in his first and second MCP joints of the right hand coupled with osteoarthritis.  He is unable to take NSAIDs due to his Xarelto.  Tylenol is not helping the pain.  He is already on narcotics in the form of fentanyl, topical NSAIDs have provided no relief, he has received several cortisone injections from an orthopedist without improvement.  Therefore I see no other option and proceeding with surgery as has been recommended by his orthopedic surgeon.  The patient simply wanted a second opinion.

## 2019-08-03 ENCOUNTER — Other Ambulatory Visit: Payer: Self-pay

## 2019-08-03 ENCOUNTER — Ambulatory Visit
Admission: RE | Admit: 2019-08-03 | Discharge: 2019-08-03 | Disposition: A | Discharge status: Home / Self Care | Payer: Medicare Other | Source: Ambulatory Visit | Attending: Orthopaedic Surgery | Admitting: Orthopaedic Surgery

## 2019-08-03 DIAGNOSIS — M5136 Other intervertebral disc degeneration, lumbar region: Secondary | ICD-10-CM

## 2019-08-29 ENCOUNTER — Telehealth: Payer: Self-pay | Admitting: *Deleted

## 2019-08-29 NOTE — Telephone Encounter (Signed)
   [facility] Pre-operative Risk Assessment    Request for surgical clearance:  1. What type of surgery is being performed? Cataract extraction OD w/ [place] goniotomy   2. When is this surgery scheduled? 09/28/19   3. What type of clearance is required (medical clearance vs. Pharmacy clearance to hold med vs. Both)? both  4. Are there any medications that need to be held prior to surgery and how long?coumadin   5. Practice name and name of physician performing surgery? [facility] Dr. Gemma Payor   6. What is your office phone number [phone]    7.   What is your office fax number [phone]  8.   Anesthesia type (None, local, MAC, general) ? MAC   Donelle Hise A Meline Russaw 08/29/2019, 4:51 PM  _________________________________________________________________

## 2019-08-30 NOTE — Telephone Encounter (Signed)
   Primary Cardiologist: [name], MD  Chart reviewed as part of pre-operative protocol coverage. Cataract extractions are recognized in guidelines as low risk surgeries that do not typically require specific preoperative testing or holding of blood thinner therapy. Therefore, given past medical history and time since last visit, based on ACC/AHA guidelines, LLEWELLYN CHOPLIN would be at acceptable risk for the planned procedure without further cardiovascular testing.   I will route this recommendation to the requesting party via Epic fax function and remove from pre-op pool.  Please call with questions.  [place], [place] 08/30/2019, 9:59 AM

## 2019-08-30 NOTE — Telephone Encounter (Signed)
Continue warfarin for cataract removal.

## 2019-08-31 NOTE — Telephone Encounter (Signed)
Appears procedure is more extensive than a standard cataract procedure.  Pt takes Xarelto for afib with CHADS2VASc score of 2 (age, HTN). Also with hx of DVT in 1996 and 2001. Started on Xarelto in 2019 after new dx of afib. Renal function is normal. Ok to hold Xarelto for 1dayprior to procedure if needed.

## 2019-08-31 NOTE — Telephone Encounter (Signed)
Holly from [employer] calling stating for the patient's previous cataracts surgery he was approved to be off his coumadin, but this time it was denied. She would like to know if he really does need to stay off it since it was approved for him to stop the first time.

## 2019-09-11 DIAGNOSIS — M65311 Trigger thumb, right thumb: Secondary | ICD-10-CM | POA: Diagnosis not present

## 2019-09-11 DIAGNOSIS — M65321 Trigger finger, right index finger: Secondary | ICD-10-CM | POA: Diagnosis not present

## 2019-09-11 DIAGNOSIS — Z4789 Encounter for other orthopedic aftercare: Secondary | ICD-10-CM | POA: Diagnosis not present

## 2019-09-14 DIAGNOSIS — Z23 Encounter for immunization: Secondary | ICD-10-CM | POA: Diagnosis not present

## 2019-09-15 ENCOUNTER — Other Ambulatory Visit: Payer: Self-pay | Admitting: Cardiology

## 2019-09-24 DIAGNOSIS — H2511 Age-related nuclear cataract, right eye: Secondary | ICD-10-CM | POA: Diagnosis not present

## 2019-09-26 ENCOUNTER — Other Ambulatory Visit: Payer: Self-pay

## 2019-09-26 ENCOUNTER — Ambulatory Visit (INDEPENDENT_AMBULATORY_CARE_PROVIDER_SITE_OTHER): Payer: Medicare Other

## 2019-09-26 ENCOUNTER — Ambulatory Visit (INDEPENDENT_AMBULATORY_CARE_PROVIDER_SITE_OTHER): Payer: Medicare Other | Admitting: Pulmonary Disease

## 2019-09-26 ENCOUNTER — Ambulatory Visit: Payer: Medicare Other | Admitting: Pulmonary Disease

## 2019-09-26 ENCOUNTER — Encounter: Payer: Self-pay | Admitting: Pulmonary Disease

## 2019-09-26 VITALS — BP 118/84 | HR 63 | Temp 98.2°F | Ht 67.0 in | Wt 242.8 lb

## 2019-09-26 DIAGNOSIS — Z87891 Personal history of nicotine dependence: Secondary | ICD-10-CM | POA: Diagnosis not present

## 2019-09-26 DIAGNOSIS — R0981 Nasal congestion: Secondary | ICD-10-CM

## 2019-09-26 DIAGNOSIS — G4733 Obstructive sleep apnea (adult) (pediatric): Secondary | ICD-10-CM | POA: Diagnosis not present

## 2019-09-26 NOTE — Assessment & Plan Note (Signed)
History of environmental allergies - trees / grasses Spring allergies  Previously managed by Dr. Donneta Romberg   Plan: Start nasal saline rinses 1-2 times daily Sample of nasal saline rinse provided today Continue Flonase Can continue starting a daily antihistamine specifically in spring months where patient typically has more nasal congestion

## 2019-09-26 NOTE — Assessment & Plan Note (Signed)
Plan: Continue CPAP therapy Follow-up in 1 year

## 2019-09-26 NOTE — Assessment & Plan Note (Signed)
Plan: Chest x-ray today 

## 2019-09-26 NOTE — Patient Instructions (Addendum)
You were seen today by Lauraine Rinne, NP  for:   1. OSA (obstructive sleep apnea)  We recommend that you continue using your CPAP daily >>>Keep up the hard work using your device >>> Goal should be wearing this for the entire night that you are sleeping, at least 4 to 6 hours  Remember:  . Do not drive or operate heavy machinery if tired or drowsy.  . Please notify the supply company and office if you are unable to use your device regularly due to missing supplies or machine being broken.  . Work on maintaining a healthy weight and following your recommended nutrition plan  . Maintain proper daily exercise and movement  . Maintaining proper use of your device can also help improve management of other chronic illnesses such as: Blood pressure, blood sugars, and weight management.   BiPAP/ CPAP Cleaning:  >>>Clean weekly, with Dawn soap, and bottle brush.  Set up to air dry. >>> Wipe mask out daily with wet wipe or towelette    2. Nasal congestion  Start nasal saline rinses 1-2 times daily  Can use Flonase after using nasal saline rinse  If you continue to have nasal congestion you can consider taking a daily antihistamine:  >>>choose one of: zyrtec, claritin, allegra, or xyzal  >>>these are over the counter medications  >>>can choose generic option  >>>take daily  >>>this medication helps with allergies, post nasal drip, and cough    Follow Up:    Return in about 1 year (around 09/25/2020), or if symptoms worsen or fail to improve, for Follow up with Dr. Halford Chessman, Follow up with Wyn Quaker FNP-C.   Please do your part to reduce the spread of COVID-19:      Reduce your risk of any infection  and COVID19 by using the similar precautions used for avoiding the common cold or flu:  Marland Kitchen Wash your hands often with soap and warm water for at least 20 seconds.  If soap and water are not readily available, use an alcohol-based hand sanitizer with at least 60% alcohol.  . If coughing or  sneezing, cover your mouth and nose by coughing or sneezing into the elbow areas of your shirt or coat, into a tissue or into your sleeve (not your hands). Langley Gauss A MASK when in public  . Avoid shaking hands with others and consider head nods or verbal greetings only. . Avoid touching your eyes, nose, or mouth with unwashed hands.  . Avoid close contact with people who are sick. . Avoid places or events with large numbers of people in one location, like concerts or sporting events. . If you have some symptoms but not all symptoms, continue to monitor at home and seek medical attention if your symptoms worsen. . If you are having a medical emergency, call 911.   [place] / e-Visit: [email or web address]         [facility]: [facility]: [phone]                   [facility]: [phone]     It is flu season:   >>> Best ways to protect herself from the flu: Receive the yearly flu vaccine, practice good hand hygiene washing with soap and also using hand sanitizer when available, eat a nutritious meals, get adequate rest, hydrate appropriately   Please contact the office if your symptoms worsen or  you have concerns that you are not improving.   Thank you for choosing [facility] for your healthcare, and for allowing [place] to partner with you on your healthcare journey. I am thankful to be able to provide care to you today.   Wyn Quaker FNP-C

## 2019-09-26 NOTE — Progress Notes (Signed)
@Patient  ID: [name], male    DOB: Oct 14, 1950, 69 y.o.   MRN: [id number]  No chief complaint on file.   Referring provider: [name], MD  HPI:  69 year old male former smoker followed in our office for obstructive sleep apnea and cough  PMH: GERD, osteoarthritis, hypertension, hyperlipidemia, A. fib Smoker/ Smoking History: Former smoker.  Quit 1985.  19-pack-year smoking history. Maintenance:  ? Arnuity  Pt of: Dr. Halford Chessman  09/26/2019  - Visit   69 year old male former smoker followed in our office for severe obstructive sleep apnea history of cough.  Patient's last spirometry on file from February/2020 was felt to be normal.  There is some restriction.  Patient does have a history of environmental allergies.  He reports that he was previously followed by Dr. Donneta Romberg.  He reports that he is allergies to trees as well as grasses and typically has increased nasal drainage when around strong perfumes.  He typically manages this with Flonase.  He reports that he does usually get spring allergies which he is currently dealing with right now.  He was previously taking Arnuity Ellipta.  He stopped taking this.  He does not remember why.  He is unsure if it was helping.  Patient is a former smoker.  Quit 1985.  19-pack-year smoking history.  Patient with known severe obstructive sleep apnea from 1993 sleep study.  CPAP compliance report today shows excellent compliance.  See compliance report listed below:  08/26/2019-09/24/2019-30 had a last 30 days use, all 30 those days greater than 4 hours, average usage 6 hours and 57 minutes, APAP setting 5-20, AHI 3.6  Questionaires / Pulmonary Flowsheets:   MMRC: mMRC Dyspnea Scale mMRC Score  09/26/2019 0   Tests:   05/25/2019-CBC with differential-eosinophils relative 4.3, eosinophils absolute 228  07/18/2018-chest x-ray-no active disease  09/14/2018-spirometry-FVC 2.9 (74% predicted), ratio 83, FEV1 2.4 (83% predicted)  Sleep  study 1993-severe obstructive sleep apnea-AHI 56  FENO:  No results found for: NITRICOXIDE  PFT: No flowsheet data found.  WALK:  No flowsheet data found.  Imaging: No results found.  Lab Results:  CBC    Component Value Date/Time   WBC 5.3 05/25/2019 0854   RBC 4.92 05/25/2019 0854   HGB 16.0 05/25/2019 0854   HCT 46.8 05/25/2019 0854   PLT 175 05/25/2019 0854   MCV 95.1 05/25/2019 0854   MCH 32.5 05/25/2019 0854   MCHC 34.2 05/25/2019 0854   RDW 13.0 05/25/2019 0854   LYMPHSABS 1,240 05/25/2019 0854   MONOABS 1.4 (H) 07/18/2018 1645   EOSABS 228 05/25/2019 0854   BASOSABS 42 05/25/2019 0854    BMET    Component Value Date/Time   NA 141 05/25/2019 0854   NA 139 05/18/2019 0958   K 4.6 05/25/2019 0854   CL 103 05/25/2019 0854   CO2 27 05/25/2019 0854   GLUCOSE 109 (H) 05/25/2019 0854   BUN 14 05/25/2019 0854   BUN 17 05/18/2019 0958   CREATININE 0.96 05/25/2019 0854   CALCIUM 9.6 05/25/2019 0854   GFRNONAA 80 05/25/2019 0854   GFRAA 92 05/25/2019 0854    BNP No results found for: BNP  ProBNP No results found for: PROBNP  Specialty Problems      Pulmonary Problems   OSA (obstructive sleep apnea)    NPSG 1993:  AHI 56/hr  Auto 12/2012:  Optimal pressure 16cm, mild increase in mask leak.       Cough   Nasal congestion  Allergies  Allergen Reactions  . Adhesive [Tape] Other (See Comments)    Causes blisters - pls use paper tape    Immunization History  Administered Date(s) Administered  . Fluad Quad(high Dose 65+) 04/06/2019  . Influenza Split 07/04/2012, 03/03/2013  . Influenza,inj,Quad PF,6+ Mos 05/03/2014, 04/30/2015, 04/03/2016, 04/08/2017, 04/11/2018  . Influenza,inj,quad, With Preservative 05/12/2017  . Pneumococcal Conjugate-13 05/03/2014  . Pneumococcal Polysaccharide-23 08/03/2008, 04/03/2016  . Tdap 08/03/2010  . Zoster 08/03/2010    Past Medical History:  Diagnosis Date  . Arthritis    oa and ddd  . Back pain,  chronic    pt states he has 3 herniated disks--uses fentanyl patch for pain  . Blood transfusion without reported diagnosis 1950   rH negative"at birth only"  . BPH (benign prostatic hyperplasia)    frequent urination, not able to hold urine well  . 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 Cataracts, both eyes    worst on left  . Colon polyps   . Complication of anesthesia    pt had spinal for a knee replacement--"woke up" during surgery--and sick after surgery  . Depression   . Diverticulosis   . DVT (deep venous thrombosis) ([place]) 2001 or 2002   right leg-required hospitalization x 8 days  . DVT (deep venous thrombosis) ([place]) 1996   right  . Glaucoma    both eyes  . H/O hiatal hernia   . Hearing impaired person, bilateral    bilateral  . Hemorrhoid   . History of IBS   . History of kidney stones    x 1-passed  . Hyperlipidemia 2012  . Hypertension   . Lumbago   . Lumbar post-laminectomy syndrome   . PONV (postoperative nausea and vomiting)   . Sleep apnea    pt uses cpap - setting of 16  . Ulcer 1966  . Ulcerative colitis ([place])   . Wears glasses   . Wears hearing aid     Tobacco History: Social History   Tobacco Use  Smoking Status Former Smoker  . Packs/day: 1.00  . Years: 19.00  . Pack years: 19.00  . Types: Cigarettes  . Quit date: 08/04/1983  . Years since quitting: 36.1  Smokeless Tobacco Never Used  Tobacco Comment   quit smoking 1985   Counseling given: Not Answered Comment: quit smoking 1985   Continue to not smoke  Outpatient Encounter Medications as of 09/26/2019  Medication Sig  . acetaminophen (TYLENOL) 500 MG tablet Take 1,000 mg by mouth every 6 (six) hours as needed for headache (pain).   Marland Kitchen albuterol (PROVENTIL HFA;VENTOLIN HFA) 108 (90 Base) MCG/ACT inhaler Inhale 2 puffs into the lungs every 4 (four) hours as needed for wheezing or shortness of breath.  Marland Kitchen atorvastatin (LIPITOR) 40 MG tablet Take 1 tablet by mouth once daily   . Calcium Carbonate-Vitamin D (CALCIUM + D PO) Take 1 tablet by mouth at bedtime.   . fentaNYL (DURAGESIC - DOSED MCG/HR) 25 MCG/HR Place 25 mcg onto the skin every 3 (three) days.   . fluticasone (FLONASE) 50 MCG/ACT nasal spray Place 1 spray into both nostrils at bedtime as needed for allergies or rhinitis.   . magnesium oxide (MAG-OX) 400 MG tablet Take 400 mg by mouth at bedtime.   . Multiple Vitamin (MULTIVITAMIN WITH MINERALS) TABS tablet Take 1 tablet by mouth at bedtime.   . Omega-3 Fatty Acids (FISH OIL) 500 MG CAPS Take 500 mg by mouth at bedtime.   Marland Kitchen  PRESCRIPTION MEDICATION Inhale into the lungs at bedtime. CPAP  . traZODone (DESYREL) 50 MG tablet Take 100 mg by mouth at bedtime.   . vortioxetine HBr (TRINTELLIX) 10 MG TABS Take 1 tablet (10 mg total) by mouth daily. (Patient taking differently: Take 10 mg by mouth at bedtime. )  . XARELTO 20 MG TABS tablet TAKE 1 TABLET BY MOUTH ONCE DAILY WITH SUPPER  . [DISCONTINUED] Fluticasone Furoate (ARNUITY ELLIPTA) 100 MCG/ACT AEPB Inhale 1 puff into the lungs daily.   No facility-administered encounter medications on file as of 09/26/2019.     Review of Systems  Review of Systems  Constitutional: Negative for activity change, chills, fatigue, fever and unexpected weight change.  HENT: Positive for rhinorrhea. Negative for postnasal drip, sinus pressure, sinus pain and sore throat.   Eyes: Negative.   Respiratory: Negative for cough, shortness of breath and wheezing.   Cardiovascular: Negative for chest pain and palpitations.  Gastrointestinal: Negative for constipation, diarrhea, nausea and vomiting.  Endocrine: Negative.   Genitourinary: Negative.   Musculoskeletal: Negative.   Skin: Negative.   Neurological: Negative for dizziness and headaches.  Psychiatric/Behavioral: Negative.  Negative for dysphoric mood. The patient is not nervous/anxious.   All other systems reviewed and are negative.    Physical Exam  BP 118/84 (BP  Location: Left Arm, Patient Position: Sitting, Cuff Size: Normal)   Pulse 63   Temp 98.2 F (36.8 C) (Temporal)   Ht 5' 7"  (1.702 m)   Wt 242 lb 12.8 oz (110.1 kg)   SpO2 94% Comment: on RA  BMI 38.03 kg/m   Wt Readings from Last 5 Encounters:  09/26/19 242 lb 12.8 oz (110.1 kg)  07/31/19 239 lb (108.4 kg)  05/25/19 230 lb (104.3 kg)  05/15/19 230 lb (104.3 kg)  04/05/19 232 lb 6.4 oz (105.4 kg)    BMI Readings from Last 5 Encounters:  09/26/19 38.03 kg/m  07/31/19 37.43 kg/m  05/25/19 36.02 kg/m  05/15/19 36.02 kg/m  04/05/19 36.40 kg/m     Physical Exam Vitals and nursing note reviewed.  Constitutional:      General: He is not in acute distress.    Appearance: Normal appearance. He is obese.  HENT:     Head: Normocephalic and atraumatic.     Right Ear: Hearing and external ear normal.     Left Ear: Hearing and external ear normal.     Nose: Rhinorrhea present. No mucosal edema.     Right Turbinates: Not enlarged.     Left Turbinates: Not enlarged.     Mouth/Throat:     Mouth: Mucous membranes are dry.     Pharynx: Oropharynx is clear. No oropharyngeal exudate.     Comments: Mallampati 2 Eyes:     Pupils: Pupils are equal, round, and reactive to light.  Cardiovascular:     Rate and Rhythm: Normal rate and regular rhythm.     Pulses: Normal pulses.     Heart sounds: Normal heart sounds. No murmur.  Pulmonary:     Effort: Pulmonary effort is normal.     Breath sounds: Normal breath sounds. No decreased breath sounds, wheezing or rales.  Musculoskeletal:     Cervical back: Normal range of motion.     Right lower leg: No edema.     Left lower leg: No edema.  Lymphadenopathy:     Cervical: No cervical adenopathy.  Skin:    General: Skin is warm and dry.     Capillary Refill: Capillary refill takes  less than 2 seconds.     Findings: No erythema or rash.  Neurological:     General: No focal deficit present.     Mental Status: He is alert and oriented to  person, place, and time.     Motor: No weakness.     Coordination: Coordination normal.     Gait: Gait is intact. Gait normal.  Psychiatric:        Mood and Affect: Mood normal.        Behavior: Behavior normal. Behavior is cooperative.        Thought Content: Thought content normal.        Judgment: Judgment normal.       Assessment & Plan:   OSA (obstructive sleep apnea) Plan: Continue CPAP therapy Follow-up in 1 year  Nasal congestion History of environmental allergies - trees / grasses Spring allergies  Previously managed by Dr. Donneta Romberg   Plan: Start nasal saline rinses 1-2 times daily Sample of nasal saline rinse provided today Continue Flonase Can continue starting a daily antihistamine specifically in spring months where patient typically has more nasal congestion  Former smoker Plan: Chest x-ray today    Return in about 1 year (around 09/25/2020), or if symptoms worsen or fail to improve, for Follow up with Dr. Halford Chessman, Follow up with Wyn Quaker FNP-C.   Lauraine Rinne, NP 09/26/2019   This appointment required 28 minutes of patient care (this includes precharting, chart review, review of results, face-to-face care, etc.).

## 2019-09-27 NOTE — Progress Notes (Signed)
Reviewed and agree with assessment/plan.   Akira Perusse, MD [facility] 07/29/2016, 12:24 PM Pager:  [phone]  

## 2019-09-28 DIAGNOSIS — H2511 Age-related nuclear cataract, right eye: Secondary | ICD-10-CM | POA: Diagnosis not present

## 2019-09-28 DIAGNOSIS — H401111 Primary open-angle glaucoma, right eye, mild stage: Secondary | ICD-10-CM | POA: Diagnosis not present

## 2019-10-03 ENCOUNTER — Ambulatory Visit (INDEPENDENT_AMBULATORY_CARE_PROVIDER_SITE_OTHER): Payer: Medicare Other | Admitting: Cardiology

## 2019-10-03 ENCOUNTER — Other Ambulatory Visit: Payer: Self-pay

## 2019-10-03 ENCOUNTER — Encounter: Payer: Self-pay | Admitting: Cardiology

## 2019-10-03 VITALS — BP 136/72 | HR 63 | Ht 67.0 in | Wt 241.4 lb

## 2019-10-03 DIAGNOSIS — I480 Paroxysmal atrial fibrillation: Secondary | ICD-10-CM

## 2019-10-03 DIAGNOSIS — I2510 Atherosclerotic heart disease of native coronary artery without angina pectoris: Secondary | ICD-10-CM | POA: Diagnosis not present

## 2019-10-03 DIAGNOSIS — Z7189 Other specified counseling: Secondary | ICD-10-CM

## 2019-10-03 DIAGNOSIS — I10 Essential (primary) hypertension: Secondary | ICD-10-CM | POA: Diagnosis not present

## 2019-10-03 DIAGNOSIS — E7800 Pure hypercholesterolemia, unspecified: Secondary | ICD-10-CM

## 2019-10-03 DIAGNOSIS — Z712 Person consulting for explanation of examination or test findings: Secondary | ICD-10-CM | POA: Diagnosis not present

## 2019-10-03 NOTE — Patient Instructions (Signed)

## 2019-10-08 ENCOUNTER — Encounter: Payer: Self-pay | Admitting: Cardiology

## 2019-10-08 DIAGNOSIS — I2510 Atherosclerotic heart disease of native coronary artery without angina pectoris: Secondary | ICD-10-CM | POA: Insufficient documentation

## 2019-10-08 NOTE — Progress Notes (Signed)
Cardiology Office Note:    Date:  10/03/2019   ID:  [name], DOB 1950-12-11, MRN [id number]  PCP:  [name], MD  Cardiologist:  [name], MD PhD  Referring MD: [name], MD   CC: followup  History of Present Illness:    [name] is a 69 y.o. male with a hx of hypertension, hyperlipidemia, prior atrial fibrillation with RVR who is seen in follow up for the evaluation and management of afib, hypertension, hyperlipidemia.  [name] was hospitalized in 26/9485 due to complications from urinary catheter placed during recent shoulder surgery, but he was also found to have new onset atrial fibrillation with RVR. He spontaneously converted to NSR. He was discharged on rivaroxaban.  Today: Doing well overall. Due for second Covid vaccine soon.   Reviewed CT results together today. Still has occasional mild burning/stinging chest pain, left sided. Still with occasional fast HR and sweats, not clear if it is afib but checks his heart rate and his rate is fast. Less frequently than before.  Denies shortness of breath at rest or with normal exertion. No PND, orthopnea, LE edema or unexpected weight gain. No syncope.  Past Medical History:  Diagnosis Date  . Arthritis    oa and ddd  . Back pain, chronic    pt states he has 3 herniated disks--uses fentanyl patch for pain  . Blood transfusion without reported diagnosis 1950   rH negative"at birth only"  . BPH (benign prostatic hyperplasia)    frequent urination, not able to hold urine well  . 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 Cataracts, both eyes    worst on left  . Colon polyps   . Complication of anesthesia    pt had spinal for a knee replacement--"woke up" during surgery--and sick after surgery  . Depression   . Diverticulosis   . DVT (deep venous thrombosis) ([place]) 2001 or 2002   right leg-required hospitalization x 8 days  . DVT (deep venous thrombosis) ([place]) 1996    right  . Glaucoma    both eyes  . H/O hiatal hernia   . Hearing impaired person, bilateral    bilateral  . Hemorrhoid   . History of IBS   . History of kidney stones    x 1-passed  . Hyperlipidemia 2012  . Hypertension   . Lumbago   . Lumbar post-laminectomy syndrome   . PONV (postoperative nausea and vomiting)   . Sleep apnea    pt uses cpap - setting of 16  . Ulcer 1966  . Ulcerative colitis ([place])   . Wears glasses   . Wears hearing aid     Past Surgical History:  Procedure Laterality Date  . APPENDECTOMY  1993  . BACK SURGERY  2008    bone spur removed   . bilateral carpal tunnel release 2009    . cervical fusion 2011--pt has good range of motion neck    . CHOLECYSTECTOMY  1994  . INSERTION OF MESH Bilateral 07/31/2016   Procedure: INSERTION OF MESH;  Surgeon: [name], MD;  Location: [facility];  Service: General;  Laterality: Bilateral;  . JOINT REPLACEMENT Bilateral    2001 left total knee and 1985 right total knee  . MASS EXCISION Left 01/04/2014   Procedure: LEFT MIDDLE FINGER MASS EXCISION WITH NAILBED REPAIR AND ADVANCEMENT FLAP;  Surgeon: [name], MD;  Location: [place];  Service: Orthopedics;  Laterality: Left;  .  mass removed from 2nd finger Left aug 2014   squamous carcinoma  . nissen fundoplication 8299    . PROSTATE SURGERY  2014   TURP  . right wrist fusion 12/12 - pt wearing brace on the wrist-not started physical therapy yet Right    fusion prevent limited ROM to right wrist  . SPINE SURGERY  2009   cervical spine fusion  . thyroid nodule     removed  . TOTAL KNEE REVISION Right 11/17/2013   Procedure: RIGHT KNEE POLYETHYLENE REVISION, bone graft;  Surgeon: [name], MD;  Location: [facility];  Service: Orthopedics;  Laterality: Right;  . TOTAL SHOULDER ARTHROPLASTY Right 01/28/2018   Procedure: RIGHT TOTAL SHOULDER ARTHROPLASTY;  Surgeon: [name], MD;  Location: [place];  Service: Orthopedics;  Laterality: Right;  .  TOTAL SHOULDER ARTHROPLASTY Left 07/15/2018   Procedure: LEFT SHOULDER ANATOMIC TOTAL SHOULDER ARTHROPLASTY;  Surgeon: [name], MD;  Location: [place];  Service: Orthopedics;  Laterality: Left;  . TRANSURETHRAL RESECTION OF PROSTATE  10/09/2011   Procedure: TRANSURETHRAL RESECTION OF THE PROSTATE WITH GYRUS INSTRUMENTS;  Surgeon: Fredricka Bonine, MD;  Location: [facility];  Service: Urology;  Laterality: N/A;  . uvvvp    . XI ROBOTIC ASSISTED INGUINAL HERNIA REPAIR WITH MESH Bilateral 07/31/2016   Procedure: XI ROBOTIC BILATERAL INGUINAL HERNIA REPAIR WITH MESH;  Surgeon: [name], MD;  Location: [facility];  Service: General;  Laterality: Bilateral;    Current Medications: Current Outpatient Medications on File Prior to Visit  Medication Sig  . acetaminophen (TYLENOL) 500 MG tablet Take 1,000 mg by mouth every 6 (six) hours as needed for headache (pain).   Marland Kitchen albuterol (PROVENTIL HFA;VENTOLIN HFA) 108 (90 Base) MCG/ACT inhaler Inhale 2 puffs into the lungs every 4 (four) hours as needed for wheezing or shortness of breath.  Marland Kitchen atorvastatin (LIPITOR) 40 MG tablet Take 1 tablet by mouth once daily  . Calcium Carbonate-Vitamin D (CALCIUM + D PO) Take 1 tablet by mouth at bedtime.   . fentaNYL (DURAGESIC - DOSED MCG/HR) 25 MCG/HR Place 25 mcg onto the skin every 3 (three) days.   . fluticasone (FLONASE) 50 MCG/ACT nasal spray Place 1 spray into both nostrils at bedtime as needed for allergies or rhinitis.   . magnesium oxide (MAG-OX) 400 MG tablet Take 400 mg by mouth at bedtime.   . Multiple Vitamin (MULTIVITAMIN WITH MINERALS) TABS tablet Take 1 tablet by mouth at bedtime.   . Omega-3 Fatty Acids (FISH OIL) 500 MG CAPS Take 500 mg by mouth at bedtime.   Marland Kitchen PRESCRIPTION MEDICATION Inhale into the lungs at bedtime. CPAP  . traZODone (DESYREL) 50 MG tablet Take 100 mg by mouth at bedtime.   . vortioxetine HBr (TRINTELLIX) 10 MG TABS Take 1 tablet (10 mg total) by mouth daily. (Patient taking  differently: Take 10 mg by mouth at bedtime. )  . XARELTO 20 MG TABS tablet TAKE 1 TABLET BY MOUTH ONCE DAILY WITH SUPPER   No current facility-administered medications on file prior to visit.     Allergies:   Adhesive [tape]   Social History   Tobacco Use  . Smoking status: Former Smoker    Packs/day: 1.00    Years: 19.00    Pack years: 19.00    Types: Cigarettes    Quit date: 08/04/1983    Years since quitting: 36.1  . Smokeless tobacco: Never Used  . Tobacco comment: quit smoking 1985  Substance Use Topics  . Alcohol use: Yes  Alcohol/week: 6.0 standard drinks    Types: 6 Cans of beer per week    Comment: maybe 2 or 3 beers per week  . Drug use: No    Family History: The patient's family history includes Arthritis in his mother; Bone cancer in his father; Cancer in his father; Colon cancer in his father; Deep vein thrombosis in his mother; Diabetes in his father, paternal grandfather, and sister; Hearing loss in his maternal grandmother and mother; Hyperlipidemia in his mother; Hypertension in his mother; Prostate cancer in his father.  ROS:   Please see the history of present illness.  Additional pertinent ROS otherwise negative.   EKGs/Labs/Other Studies Reviewed:    The following studies were reviewed today: CT cardiac 05/25/19 FINDINGS: Coronary calcium score: The patient's coronary artery calcium score is 236, which places the patient in the 59 percentile.  Coronary arteries: Normal coronary origins.  Right dominance.  Right Coronary Artery: Dominant, large caliber vessel, gives rise to PDA and PLB. Proximal RCA has mixed calcified and noncalcified plaque with no more than 1-24% stenosis. Mid and distal RC has scattered mixed plaque but no significant stenosis.  Left Main Coronary Artery: Trivial calcified plaque, no significant stenosis.  Left Anterior Descending Coronary Artery: Scattered diffuse mixed calcified and noncalcified plaque in proximal  LAD, with no more than 1-24% stenosis. Gives rise to 1 small and 1 medium caliber diagonal branches. Mid to distal LAD without significant plaque or stenosis, but caliber becomes small after D2 branch. Distal LAD wraps LV apex.  Left Circumflex Artery: Proximal LCX has mixed calcified and noncalcified plaque with no more than 1-24% stenosis. Gives rise to 2 large caliber OM branches.  Aorta: Normal size, 34 mm at the mid ascending aorta (level of the PA bifurcation) measured double oblique. Mild calcifications. No dissection.  Aortic Valve: No calcifications. Trileaflet.  Other findings:  Normal pulmonary vein drainage into the left atrium.  Normal left atrial appendage without a thrombus.  Normal size of the pulmonary artery.  IMPRESSION: 1.  Scattered nonobstructive CAD, CADRADS = 1.  2. Coronary calcium score of 236. This was 59th percentile for age and sex matched control.  3. Normal coronary origin with right dominance.  Monitor 08/02/18 ~21 days of data recorded on [employer] monitor. Patient had a min HR of 50 bpm, max HR of 181 bpm, and avg HR of 70 bpm. Predominant underlying rhythm was Sinus Rhythm. There was one episode of rapid atrial fibrillation on 08/06/2018 with a rate of 181 bpm. Total time in atrial fibrillation was 2 hrs 33 min, longest event was 13 minutes. AF occurred 38 time(s) with HR range of 75 - 181; Total AF Burden 1%  No VT, SVT, high degree block, or pauses noted. Isolated atrial and ventricular ectopy was rare (<1%). There were 16 symptomatic and 22 asymptomatic triggered events. All symptomatic events were either normal sinus rhythm, sinus tachycardia, or sinus rhythm with a PAC.  Echo 07/19/18 - Left ventricle: The cavity size was normal. Systolic function was   normal. The estimated ejection fraction was in the range of 60%   to 65%. Wall motion was normal; there were no regional wall   motion abnormalities. Features are consistent with a  pseudonormal   left ventricular filling pattern, with concomitant abnormal   relaxation and increased filling pressure (grade 2 diastolic   dysfunction). There was no evidence of elevated ventricular   filling pressure by Doppler parameters. - Aortic root: The aortic root was normal in  size. - Left atrium: The atrium was moderately dilated. - Right ventricle: The cavity size was normal. Wall thickness was   normal. Systolic function was normal. - Right atrium: The atrium was normal in size. - Tricuspid valve: There was mild regurgitation. - Pulmonary arteries: Systolic pressure was within the normal   range. - Inferior vena cava: The vessel was dilated. The respirophasic   diameter changes were blunted (< 50%), consistent with elevated   central venous pressure. - Pericardium, extracardiac: There was no pericardial effusion.  MPI 2007--I cannot see complete results  EKG:  EKG is personally reviewed.  The ekg from 08/25/18 demonstrates sinus bradycardia at 55 bpm.  Recent Labs: 05/25/2019: ALT 38; BUN 14; Creat 0.96; Hemoglobin 16.0; Platelets 175; Potassium 4.6; Sodium 141; TSH 3.48  Recent Lipid Panel    Component Value Date/Time   CHOL 148 05/25/2019 0854   TRIG 72 05/25/2019 0854   HDL 58 05/25/2019 0854   CHOLHDL 2.6 05/25/2019 0854   VLDL 10 07/19/2018 0323   LDLCALC 75 05/25/2019 0854    Physical Exam:    VS:  BP 136/72   Pulse 63   Ht 5' 7"  (1.702 m)   Wt 241 lb 6.4 oz (109.5 kg)   SpO2 91%   BMI 37.81 kg/m     Wt Readings from Last 3 Encounters:  10/03/19 241 lb 6.4 oz (109.5 kg)  09/26/19 242 lb 12.8 oz (110.1 kg)  07/31/19 239 lb (108.4 kg)    GEN: Well nourished, well developed in no acute distress HEENT: Normal, moist mucous membranes NECK: No JVD CARDIAC: regular rhythm, normal S1 and S2, no rubs or gallops. No murmur. VASCULAR: Radial and DP pulses 2+ bilaterally. No carotid bruits RESPIRATORY:  Clear to auscultation without rales, wheezing or  rhonchi  ABDOMEN: Soft, non-tender, non-distended MUSCULOSKELETAL:  Ambulates independently SKIN: Warm and dry, no edema NEUROLOGIC:  Alert and oriented x 3. No focal neuro deficits noted. PSYCHIATRIC:  Normal affect   ASSESSMENT:    1. Nonocclusive coronary atherosclerosis of native coronary artery   2. Paroxysmal atrial fibrillation (HCC)   3. Encounter to discuss test results   4. Pure hypercholesterolemia   5. Essential hypertension   6. Cardiac risk counseling   7. Counseling on health promotion and disease prevention    PLAN:    Nonobstructive CAD: based on results of CT cardiac for chest pain -on atorvastatin 40 mg daily -no aspirin as he is on rivaroxaban -discussed results of CT together today  Atrial fibrillation, with RVR in hospital and spontaneous conversion: -CHA2DS2/VAS Stroke Risk Points=3 (HTN, HLD, age)  -on rivaroxaban for anticoagulation -regular rhythm today -asymptomatic -has instructions for PRN metoprolol   Hypertension: near goal of <130/80 on no medications -will work on lifestyle -if remains above goal in the future, did well on lisinopril, would restart  Hypercholesterolemia: reviewed lipids in [facility]. 05/25/19, Tchol 148, HDL 58, LDL 75, TG 72 -LDL goal <70 with nonobstructive CAD -continue atorvastatin 40 mg -working on lifestyle, as below  Lifestyle recommendations -recommend heart healthy/Mediterranean diet, with whole grains, fruits, vegetable, fish, lean meats, nuts, and olive oil. Limit salt. -recommend moderate walking, 3-5 times/week for 30-50 minutes each session. Aim for at least 150 minutes.week. Goal should be pace of 3 miles/hours, or walking 1.5 miles in 30 minutes -recommend avoidance of tobacco products. Avoid excess alcohol.  Plan for follow up: 6 mos or sooner as needed  [name], MD, PhD [place]  [facility]   Medication  Adjustments/Labs and Tests Ordered: Current medicines are reviewed at length with  the patient today.  Concerns regarding medicines are outlined above.  Orders Placed This Encounter  Procedures  . EKG 12-Lead   No orders of the defined types were placed in this encounter.   Patient Instructions  Medication Instructions:  Your Physician recommend you continue on your current medication as directed.    *If you need a refill on your cardiac medications before your next appointment, please call your pharmacy*   Lab Work: None   Testing/Procedures: None   Follow-Up: At [facility], you and your health needs are our priority.  As part of our continuing mission to provide you with exceptional heart care, we have created designated Provider Care Teams.  These Care Teams include your primary Cardiologist (physician) and Advanced Practice Providers (APPs -  Physician Assistants and Nurse Practitioners) who all work together to provide you with the care you need, when you need it.  We recommend signing up for the patient portal called "MyChart".  Sign up information is provided on this After Visit Summary.  MyChart is used to connect with patients for Virtual Visits (Telemedicine).  Patients are able to view lab/test results, encounter notes, upcoming appointments, etc.  Non-urgent messages can be sent to your provider as well.   To learn more about what you can do with MyChart, go to [email or web address].    Your next appointment:   6 month(s)  The format for your next appointment:   In Person  Provider:   [name], MD     Signed, [name], MD PhD 10/03/2019  [place]

## 2019-10-13 DIAGNOSIS — Z23 Encounter for immunization: Secondary | ICD-10-CM | POA: Diagnosis not present

## 2019-11-09 DIAGNOSIS — G4700 Insomnia, unspecified: Secondary | ICD-10-CM | POA: Diagnosis not present

## 2019-12-01 ENCOUNTER — Telehealth: Payer: Self-pay | Admitting: Family Medicine

## 2019-12-01 NOTE — Chronic Care Management (AMB) (Signed)
  Chronic Care Management   Note  12/01/2019 Name: [name] MRN: [id number] DOB: [birth year]  [name] is a 69 y.o. year old male who is a primary care patient of [name], MD. I reached out to [name] by phone today in response to a referral sent by Mr. [name]'s PCP, [name], MD.   [name] was given information about Chronic Care Management services today including:  1. CCM service includes personalized support from designated clinical staff supervised by his physician, including individualized plan of care and coordination with other care providers 2. 24/7 contact phone [name] for assistance for urgent and routine care needs. 3. Service will only be billed when office clinical staff spend 20 minutes or more in a month to coordinate care. 4. Only one practitioner may furnish and bill the service in a calendar month. 5. The patient may stop CCM services at any time (effective at the end of the month) by phone call to the office staff.   Patient agreed to services and verbal consent obtained.   Follow up plan:   [place]

## 2019-12-06 NOTE — Chronic Care Management (AMB) (Addendum)
Chronic Care Management Pharmacy  Name: AVERILL PONS  MRN: [id number] DOB: 1950/09/26   Chief Complaint/ HPI  [name],  69 y.o. , male presents for their Initial CCM visit with the clinical pharmacist via telephone.  PCP : [name], MD  Their chronic conditions include: hypertension, GERD, hyperlipidemia, and afib.  Office Visits:  07/30/2020 ([facility]) - trigger finger in right hand, unable to take NSAIDs due to Xarelto.  Topicals provide no relief, recommend surgery  Consult Visit:  10/03/2019 Harrell Gave, Cardiology) - goal BP < 130/80, mentioned restarting lisinopril if patient remains above goal patient did well, LDL goal < 70 w/ CAD.  Working on diet with whole grains, fruits, fish, lean meats, recommended walking 3-5 times weekly.  09/26/19 Warner Mccreedy, Pulmonology) - seen for OSA, was on [place] but stopped taking for unknown cause.  Recommended one antihistamine for nasal congestion.  Medications: Outpatient Encounter Medications as of 12/08/2019  Medication Sig   acetaminophen (TYLENOL) 500 MG tablet Take 1,000 mg by mouth every 6 (six) hours as needed for headache (pain).    albuterol (PROVENTIL HFA;VENTOLIN HFA) 108 (90 Base) MCG/ACT inhaler Inhale 2 puffs into the lungs every 4 (four) hours as needed for wheezing or shortness of breath.   atorvastatin (LIPITOR) 40 MG tablet Take 1 tablet by mouth once daily   Calcium Carbonate-Vitamin D (CALCIUM + D PO) Take 1 tablet by mouth at bedtime.    fentaNYL (DURAGESIC - DOSED MCG/HR) 25 MCG/HR Place 25 mcg onto the skin every 3 (three) days.    fluticasone (FLONASE) 50 MCG/ACT nasal spray Place 1 spray into both nostrils at bedtime as needed for allergies or rhinitis.    magnesium oxide (MAG-OX) 400 MG tablet Take 400 mg by mouth at bedtime.    Multiple Vitamin (MULTIVITAMIN WITH MINERALS) TABS tablet Take 1 tablet by mouth at bedtime.    Omega-3 Fatty Acids (FISH OIL) 500 MG CAPS Take 500 mg by mouth at  bedtime.    PRESCRIPTION MEDICATION Inhale into the lungs at bedtime. CPAP   traZODone (DESYREL) 50 MG tablet Take 100 mg by mouth at bedtime.    vortioxetine HBr (TRINTELLIX) 10 MG TABS Take 1 tablet (10 mg total) by mouth daily. (Patient taking differently: Take 10 mg by mouth at bedtime. )   XARELTO 20 MG TABS tablet TAKE 1 TABLET BY MOUTH ONCE DAILY WITH SUPPER   No facility-administered encounter medications on file as of 12/08/2019.     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 Current antihypertensive regimen: none Previous antihypertensives tried: lisinopril Last practice recorded BP readings:  BP Readings from Last 3 Encounters:  10/03/19 136/72  09/26/19 118/84  07/31/19 110/72   Current home BP readings: 106/71 today in office via wrist device   Pharmacist Clinical Goal(s):  Over the next 6 months, patient will work with PharmD and providers to optimize antihypertensive regimen  Interventions: Inter-disciplinary care team collaboration (see longitudinal plan of care) Comprehensive medication review performed; medication list updated in the electronic medical record.  Continue to monitor blood  pressure at least once daily and report any consistent readings > 130/80 to PharmD or PCP.  Patient Self Care Activities:  Patient will continue to check BP at least once daily , document, and provide at future appointments Patient will focus on medication adherence by using pill box. Over the next 6 months patient is to report any consistent blood pressure readings > 130/90 to PharmD or PCP.  Initial goal documentation       High Cholesterol - LDL < 70       CARE PLAN ENTRY (see longitudinal plan of care for additional care plan information)  Current Barriers:  Uncontrolled hyperlipidemia, complicated by hypertension, afib, and GERD. Current antihyperlipidemic regimen: atorvastatin 35m Previous antihyperlipidemic medications tried: none Most recent lipid panel:     Component Value Date/Time   CHOL 148 05/25/2019 0854   TRIG 72 05/25/2019 0854   HDL 58 05/25/2019 0854   CHOLHDL 2.6 05/25/2019 0854   VLDL 10 07/19/2018 0323   LDLCALC 75 05/25/2019 0854   ASCVD risk enhancing conditions: age >>30 DM, HTN, CKD, CHF, current smoker 10-year ASCVD risk score: 16.1%  Pharmacist Clinical Goal(s):  Over the next 6 months, patient will work with PharmD and providers towards optimized antihyperlipidemic therapy  Interventions: Comprehensive medication review performed; medication list updated in electronic medical record.  Inter-disciplinary care team collaboration (see longitudinal plan of care) Schedule annual physical with lipid panel, due in October.  Patient Self Care Activities:  Patient will focus on medication adherence by using a pill box. Over the next 6 months patient is to schedule appointment for physical with Dr. PDennard Schaumannto get updated lipid panel to evaluate current medication therapy.  Initial goal documentation          Hypertension    Office blood pressures are  BP Readings from Last 3 Encounters:  10/03/19 136/72  09/26/19 118/84  07/31/19 110/72    Patient has failed these meds in the past: lisinopril did well was just d/c  Patient checks BP at home daily using wrist monitor.   Patient home BP readings are ranging: Did not have log but BP today using wrist monitor he was wearing was 106/71.  Patient is currently controlled on the following medications: none   We discussed calling PharmD or PCP if BP is consistently elevated above 130/90.   Plan  Continue  to monitor at home and record in log.  If ever elevated will restart lisinopril pending approval from Dr. PDennard Schaumann    AFIB   Patient is currently rate controlled prn with metoprolol tartrate 26mgiven to him by cardiology.   Patient has failed these meds in past: none Patient is currently controlled on the following medications: metoprolol tartrate 2585mrn   We discussed:  He has not had to take metoprolol yet as when he goes into afib it usually resolves on its own.  Has clear direction from cardiology on when to go to ER regarding signs and symptoms of atrial fibrilation.  Plan  Continue current medications   Hyperlipidemia   Lipid Panel     Component Value Date/Time   CHOL 148 05/25/2019 0854   TRIG 72 05/25/2019 0854   HDL 58 05/25/2019 0854   CHOLHDL 2.6 05/25/2019 0854   VLDL 10 07/19/2018 0323   LDLCALC 75 05/25/2019 0854     The 10-year ASCVD risk score (Goff DC Jr., et al., 2013) is: 16.1%   Values used to calculate the score:     Age: [age]  Sex: Male     Is Non-Hispanic African American: No     Diabetic: No     Tobacco smoker: No     Systolic Blood Pressure: 115 mmHg     Is BP treated: No     HDL Cholesterol: 58 mg/dL     Total Cholesterol: 148 mg/dL   Patient has failed these meds in past: none Patient is currently controlled on the following medications: atorvastatin 59m  No adverse effects reported with medication.  Patient due for lipid panel at next physical in October.   Plan  Continue current medications.  Will assess efficacy after yearly physical in October.  GERD    Patient has failed these meds in past: none Patient is currently controlled on the following medications: none  We discussed:  Patient had surgical procedure done putting metal in stomach for acid reflux symptoms and has not had symptoms since.  He does not require medication for treatment of this issue.  Plan  Continue current maintenance plan and food  avoidances.   Vaccines   Reviewed and discussed patient's vaccination history.    Immunization History  Administered Date(s) Administered   Fluad Quad(high Dose 65+) 04/06/2019   Influenza Split 07/04/2012, 03/03/2013   Influenza,inj,Quad PF,6+ Mos 05/03/2014, 04/30/2015, 04/03/2016, 04/08/2017, 04/11/2018   Influenza,inj,quad, With Preservative 05/12/2017   Pneumococcal Conjugate-13 05/03/2014   Pneumococcal Polysaccharide-23 08/03/2008, 04/03/2016   Tdap 08/03/2010   Zoster 08/03/2010    Plan  Recommended patient receive Shingrix vaccine in pharmacy.      Medication Management   Miscellaneous medications: Trintellix 167m fentanyl 2525mhr, trazadone 52m57m is checking into the cost of Trintellix as he was getting samples from Dr. PickDennard Schaumann this.  Will have prescription called into pharmacy to check price.  If medication is too expensive for the patient will try and switch to Lexapro d/t cost. OTC's: Tylenol 500mg49mlcium + Vit D, magnesium oxide 400mg,5m Patient currently uses [employer] [facility] #  [phone] reports using pill box method to organize medications and promote adherence. Patient denies missed doses of medication.   ChristBeverly MilchmD Clinical Pharmacist [place] [phone] collaborated with the care management provider regarding care management and care coordination activities outlined in this encounter and have reviewed this encounter including documentation in the note and care plan. I am certifying that I agree with the content of this note and encounter as supervising physician.

## 2019-12-07 ENCOUNTER — Other Ambulatory Visit: Payer: Self-pay | Admitting: Family Medicine

## 2019-12-07 DIAGNOSIS — I10 Essential (primary) hypertension: Secondary | ICD-10-CM

## 2019-12-07 DIAGNOSIS — I480 Paroxysmal atrial fibrillation: Secondary | ICD-10-CM

## 2019-12-07 DIAGNOSIS — E7800 Pure hypercholesterolemia, unspecified: Secondary | ICD-10-CM

## 2019-12-08 ENCOUNTER — Other Ambulatory Visit: Payer: Self-pay

## 2019-12-08 ENCOUNTER — Ambulatory Visit: Payer: Medicare Other | Admitting: Pharmacist

## 2019-12-08 DIAGNOSIS — E785 Hyperlipidemia, unspecified: Secondary | ICD-10-CM

## 2019-12-08 DIAGNOSIS — I10 Essential (primary) hypertension: Secondary | ICD-10-CM

## 2019-12-08 NOTE — Patient Instructions (Addendum)
Thank you for meeting with me today!  I look forward to working with you to help you meet all of your healthcare goals and answer any questions you may have.  Feel free to contact me anytime!   Visit Information  Goals Addressed            This Visit's Progress   . High Blood Pressure - < 130/90       CARE PLAN ENTRY (see longitudinal plan of care for additional care plan information)  Current Barriers:  . Controlled hypertension, complicated by hyperlipidemia, afib, and GERD. . Current antihypertensive regimen: none . Previous antihypertensives tried: lisinopril . Last practice recorded BP readings:  BP Readings from Last 3 Encounters:  10/03/19 136/72  09/26/19 118/84  07/31/19 110/72   . Current home BP readings: 106/71 today in office via wrist device   Pharmacist Clinical Goal(s):  Marland Kitchen Over the next 6 months, patient will work with PharmD and providers to optimize antihypertensive regimen  Interventions: . Inter-disciplinary care team collaboration (see longitudinal plan of care) . Comprehensive medication review performed; medication list updated in the electronic medical record.  . Continue to monitor blood pressure at least once daily and report any consistent readings > 130/80 to PharmD or PCP.  Patient Self Care Activities:  . Patient will continue to check BP at least once daily , document, and provide at future appointments . Patient will focus on medication adherence by using pill box. . Over the next 6 months patient is to report any consistent blood pressure readings > 130/90 to PharmD or PCP.  Initial goal documentation     . High Cholesterol - LDL < 70       CARE PLAN ENTRY (see longitudinal plan of care for additional care plan information)  Current Barriers:  . Uncontrolled hyperlipidemia, complicated by hypertension, afib, and GERD. . Current antihyperlipidemic regimen: atorvastatin 58m . Previous antihyperlipidemic medications tried: none . Most  recent lipid panel:     Component Value Date/Time   CHOL 148 05/25/2019 0854   TRIG 72 05/25/2019 0854   HDL 58 05/25/2019 0854   CHOLHDL 2.6 05/25/2019 0854   VLDL 10 07/19/2018 0323   LDLCALC 75 05/25/2019 0854 .   .Marland KitchenASCVD risk enhancing conditions: age >>3 DM, HTN, CKD, CHF, current smoker . 10-year ASCVD risk score: 16.1%  Pharmacist Clinical Goal(s):  .Marland KitchenOver the next 6 months, patient will work with PharmD and providers towards optimized antihyperlipidemic therapy  Interventions: . Comprehensive medication review performed; medication list updated in electronic medical record.  .[facility] team collaboration (see longitudinal plan of care) . Schedule annual physical with lipid panel, due in October.  Patient Self Care Activities:  . Patient will focus on medication adherence by using a pill box. . Over the next 6 months patient is to schedule appointment for physical with Dr. PDennard Schaumannto get updated lipid panel to evaluate current medication therapy.  Initial goal documentation        Mr. BBunwas given information about Chronic Care Management services today including:  1. CCM service includes personalized support from designated clinical staff supervised by his physician, including individualized plan of care and coordination with other care providers 2. 24/7 contact phone numbers for assistance for urgent and routine care needs. 3. Standard insurance, coinsurance, copays and deductibles apply for chronic care management only during months in which we provide at least 20 minutes of these services. Most insurances cover these services at 100%, however patients  may be responsible for any copay, coinsurance and/or deductible if applicable. This service may help you avoid the need for more expensive face-to-face services. 4. Only one practitioner may furnish and bill the service in a calendar month. 5. The patient may stop CCM services at any time (effective at  the end of the month) by phone call to the office staff.  Patient agreed to services and verbal consent obtained.   The patient verbalized understanding of instructions provided today and agreed to receive a mailed copy of patient instruction and/or educational materials. Telephone follow up appointment with pharmacy team member scheduled for: 06/11/2020 @ 8:30 AM  Thank you for meeting with me today!  I look forward to working with you to help you meet all of your healthcare goals and answer any questions you may have.  Feel free to contact me anytime!   Preventing High Cholesterol Cholesterol is a white, waxy substance similar to fat that the human body needs to help build cells. The liver makes all the cholesterol that a person's body needs. Having high cholesterol (hypercholesterolemia) increases a person's risk for heart disease and stroke. Extra (excess) cholesterol comes from the food the person eats. High cholesterol can often be prevented with diet and lifestyle changes. If you already have high cholesterol, you can control it with diet and lifestyle changes and with medicine. How can high cholesterol affect me? If you have high cholesterol, deposits (plaques) may build up on the walls of your arteries. The arteries are the blood vessels that carry blood away from your heart. Plaques make the arteries narrower and stiffer. This can limit or block blood flow and cause blood clots to form. Blood clots:  Are tiny balls of cells that form in your blood.  Can move to the heart or brain, causing a heart attack or stroke. Plaques in arteries greatly increase your risk for heart attack and stroke.Making diet and lifestyle changes can reduce your risk for these conditions that may threaten your life. What can increase my risk? This condition is more likely to develop in people who:  Eat foods that are high in saturated fat or cholesterol. Saturated fat is mostly found in: ? Foods that contain  animal fat, such as red meat and some dairy products. ? Certain fatty foods made from plants, such as tropical oils.  Are overweight.  Are not getting enough exercise.  Have a family history of high cholesterol. What actions can I take to prevent this? Nutrition   Eat less saturated fat.  Avoid trans fats (partially hydrogenated oils). These are often found in margarine and in some baked goods, fried foods, and snacks bought in packages.  Avoid precooked or cured meat, such as sausages or meat loaves.  Avoid foods and drinks that have added sugars.  Eat more fruits, vegetables, and whole grains.  Choose healthy sources of protein, such as fish, poultry, lean cuts of red meat, beans, peas, lentils, and nuts.  Choose healthy sources of fat, such as: ? Nuts. ? Vegetable oils, especially olive oil. ? Fish that have healthy fats (omega-3 fatty acids), such as mackerel or salmon. The items listed above may not be a complete list of recommended foods and beverages. Contact a dietitian for more information. Lifestyle  Lose weight if you are overweight. Losing 5-10 lb (2.3-4.5 kg) can help prevent or control high cholesterol. It can also lower your risk for diabetes and high blood pressure. Ask your health care provider to help you with  a diet and exercise plan to lose weight safely.  Do not use any products that contain nicotine or tobacco, such as cigarettes, e-cigarettes, and chewing tobacco. If you need help quitting, ask your health care provider.  Limit your alcohol intake. ? Do not drink alcohol if:  Your health care provider tells you not to drink.  You are pregnant, may be pregnant, or are planning to become pregnant. ? If you drink alcohol:  Limit how much you use to:  0-1 drink a day for women.  0-2 drinks a day for men.  Be aware of how much alcohol is in your drink. In the [place] one drink equals one 12 oz bottle of beer (355 mL), one 5 oz glass of wine (148 mL),  or one 1 oz glass of hard liquor (44 mL). Activity   Get enough exercise. Each week, do at least 150 minutes of exercise that takes a medium level of effort (moderate-intensity exercise). ? This is exercise that:  Makes your heart beat faster and makes you breathe harder than usual.  Allows you to still be able to talk. ? You could exercise in short sessions several times a day or longer sessions a few times a week. For example, on 5 days each week, you could walk fast or ride your bike 3 times a day for 10 minutes each time.  Do exercises as told by your health care provider. Medicines  In addition to diet and lifestyle changes, your health care provider may recommend medicines to help lower cholesterol. This may be a medicine to lower the amount of cholesterol your liver makes. You may need medicine if: ? Diet and lifestyle changes do not lower your cholesterol enough. ? You have high cholesterol and other risk factors for heart disease or stroke.  Take over-the-counter and prescription medicines only as told by your health care provider. General information  Manage your risk factors for high cholesterol. Talk with your health care provider about all your risk factors and how to lower your risk.  Manage other conditions that you have, such as diabetes or high blood pressure (hypertension).  Have blood tests to check your cholesterol levels at regular points in time as told by your health care provider.  Keep all follow-up visits as told by your health care provider. This is important. Where to find more information  American Heart Association: www.heart.org  National Heart, Lung, and Blood Institute: https://wilson-eaton.com/ Summary  High cholesterol increases your risk for heart disease and stroke. By keeping your cholesterol level low, you can reduce your risk for these conditions.  High cholesterol can often be prevented with diet and lifestyle changes.  Work with your health  care provider to manage your risk factors, and have your blood tested regularly. This information is not intended to replace advice given to you by your health care provider. Make sure you discuss any questions you have with your health care provider. Document Revised: 11/11/2018 Document Reviewed: 03/28/2016 Elsevier Patient Education  2020 [employer].

## 2019-12-11 ENCOUNTER — Other Ambulatory Visit: Payer: Self-pay | Admitting: Family Medicine

## 2019-12-11 MED ORDER — VORTIOXETINE HBR 10 MG PO TABS: 10.0000 mg | ORAL_TABLET | Freq: Every day | ORAL | 5 refills | Status: DC

## 2019-12-11 MED ORDER — ALBUTEROL SULFATE HFA 108 (90 BASE) MCG/ACT IN AERS: 2.0000 | INHALATION_SPRAY | RESPIRATORY_TRACT | 2 refills | Status: AC | PRN

## 2019-12-25 ENCOUNTER — Other Ambulatory Visit: Payer: Self-pay | Admitting: Cardiology

## 2019-12-25 NOTE — Telephone Encounter (Signed)
Rx request sent to pharmacy.  

## 2019-12-27 ENCOUNTER — Ambulatory Visit: Payer: Self-pay | Admitting: Pharmacist

## 2019-12-27 NOTE — Chronic Care Management (AMB) (Addendum)
  Chronic Care Management   Follow Up Note   12/27/2019 Name: [name] MRN: [id number] DOB: [birth year]  Referred by: [name] Frizzle, MD Reason for referral : No chief complaint on file.   [name] is a 69 y.o. year old male who is a primary care patient of Pickard, Cammie Mcgee, MD. The CCM team was consulted for assistance with chronic disease management and care coordination needs.    Review of patient status, including review of consultants reports, relevant laboratory and other test results, and collaboration with appropriate care team members and the patient's provider was performed as part of comprehensive patient evaluation and provision of chronic care management services.    SDOH (Social Determinants of Health) assessments performed: No See Care Plan activities for detailed interventions related to [name])      Outpatient Encounter Medications as of 12/27/2019  Medication Sig   acetaminophen (TYLENOL) 500 MG tablet Take 1,000 mg by mouth every 6 (six) hours as needed for headache (pain).    albuterol (VENTOLIN HFA) 108 (90 Base) MCG/ACT inhaler Inhale 2 puffs into the lungs every 4 (four) hours as needed for wheezing or shortness of breath.   atorvastatin (LIPITOR) 40 MG tablet Take 1 tablet by mouth once daily   Calcium Carbonate-Vitamin D (CALCIUM + D PO) Take 1 tablet by mouth at bedtime.    fentaNYL (DURAGESIC - DOSED MCG/HR) 25 MCG/HR Place 25 mcg onto the skin every 3 (three) days.    fluticasone (FLONASE) 50 MCG/ACT nasal spray Place 1 spray into both nostrils at bedtime as needed for allergies or rhinitis.    magnesium oxide (MAG-OX) 400 MG tablet Take 400 mg by mouth at bedtime.    Multiple Vitamin (MULTIVITAMIN WITH MINERALS) TABS tablet Take 1 tablet by mouth at bedtime.    Omega-3 Fatty Acids (FISH OIL) 500 MG CAPS Take 500 mg by mouth at bedtime.    PRESCRIPTION MEDICATION Inhale into the lungs at bedtime. CPAP   traZODone (DESYREL) 50 MG tablet Take 100 mg  by mouth at bedtime.    vortioxetine HBr (TRINTELLIX) 10 MG TABS tablet Take 1 tablet (10 mg total) by mouth at bedtime.   XARELTO 20 MG TABS tablet TAKE 1 TABLET BY MOUTH ONCE DAILY WITH SUPPER   No facility-administered encounter medications on file as of 12/27/2019.     Objective:   Called patient back to let him know that I will be taking over his Trintellix samples since [place] left.  We tried to call in prescription to his pharmacy and it was very expensive.  She provided me with the phone number of the rep to get in touch with whenever [name] needs more samples.   Plan:   [name] is to contact me with a weeks notice to get samples for him and I will contact [name] the rep to bring some out to the practice.  Currently has plenty on hand.  The patient has been provided with contact information for the care management team and has been advised to call with any health related questions or concerns.    Beverly Milch, PharmD Clinical Pharmacist [place] [phone]  I have collaborated with the care management provider regarding care management and care coordination activities outlined in this encounter and have reviewed this encounter including documentation in the note and care plan. I am certifying that I agree with the content of this note and encounter as supervising physician.

## 2020-01-11 ENCOUNTER — Ambulatory Visit: Payer: Self-pay | Admitting: Pharmacist

## 2020-01-11 NOTE — Progress Notes (Addendum)
  Chronic Care Management   Follow Up Note   01/11/2020 Name: JORDYNN PERRIER MRN: [id number] DOB: 1951-05-19  Referred by: [name], MD Reason for referral : No chief complaint on file.   DANIELA HERNAN is a 69 y.o. year old male who is a primary care patient of Pickard, Cammie Mcgee, MD. The CCM team was consulted for assistance with chronic disease management and care coordination needs.    Review of patient status, including review of consultants reports, relevant laboratory and other test results, and collaboration with appropriate care team members and the patient's provider was performed as part of comprehensive patient evaluation and provision of chronic care management services.    SDOH (Social Determinants of Health) assessments performed: No See Care Plan activities for detailed interventions related to [facility])      Outpatient Encounter Medications as of 01/11/2020  Medication Sig   acetaminophen (TYLENOL) 500 MG tablet Take 1,000 mg by mouth every 6 (six) hours as needed for headache (pain).    albuterol (VENTOLIN HFA) 108 (90 Base) MCG/ACT inhaler Inhale 2 puffs into the lungs every 4 (four) hours as needed for wheezing or shortness of breath.   atorvastatin (LIPITOR) 40 MG tablet Take 1 tablet by mouth once daily   Calcium Carbonate-Vitamin D (CALCIUM + D PO) Take 1 tablet by mouth at bedtime.    fentaNYL (DURAGESIC - DOSED MCG/HR) 25 MCG/HR Place 25 mcg onto the skin every 3 (three) days.    fluticasone (FLONASE) 50 MCG/ACT nasal spray Place 1 spray into both nostrils at bedtime as needed for allergies or rhinitis.    magnesium oxide (MAG-OX) 400 MG tablet Take 400 mg by mouth at bedtime.    Multiple Vitamin (MULTIVITAMIN WITH MINERALS) TABS tablet Take 1 tablet by mouth at bedtime.    Omega-3 Fatty Acids (FISH OIL) 500 MG CAPS Take 500 mg by mouth at bedtime.    PRESCRIPTION MEDICATION Inhale into the lungs at bedtime. CPAP   traZODone (DESYREL) 50 MG tablet Take 100 mg  by mouth at bedtime.    vortioxetine HBr (TRINTELLIX) 10 MG TABS tablet Take 1 tablet (10 mg total) by mouth at bedtime.   XARELTO 20 MG TABS tablet TAKE 1 TABLET BY MOUTH ONCE DAILY WITH SUPPER   No facility-administered encounter medications on file as of 01/11/2020.     Objective:   Called patient to inform him the [employer] rep dropped off his samples.  Also filled out PAP for [employer] and left up front for patient to attach proof of income.  Will try to send off once all information completed as rep was worried she may not be able to continue to bring samples.  Plan:   Patient to come pick up samples and PAP application and attach necessary information.  The patient has been provided with contact information for the care management team and has been advised to call with any health related questions or concerns.    Beverly Milch, PharmD Clinical Pharmacist [place] [phone]  I have collaborated with the care management provider regarding care management and care coordination activities outlined in this encounter and have reviewed this encounter including documentation in the note and care plan. I am certifying that I agree with the content of this note and encounter as supervising physician.

## 2020-02-15 ENCOUNTER — Telehealth: Payer: Self-pay | Admitting: *Deleted

## 2020-02-15 NOTE — Telephone Encounter (Signed)
A detailed message was left,re: his follow up visit. °

## 2020-03-12 ENCOUNTER — Other Ambulatory Visit: Payer: Self-pay | Admitting: Family Medicine

## 2020-03-28 ENCOUNTER — Other Ambulatory Visit: Payer: Self-pay | Admitting: Cardiology

## 2020-03-29 NOTE — Telephone Encounter (Signed)
A.Fib male 69yo 109.5kg Scr = 0.96 CrCl > 100 ml/min

## 2020-04-02 ENCOUNTER — Telehealth: Payer: Self-pay | Admitting: Cardiology

## 2020-04-02 NOTE — Telephone Encounter (Signed)
Patient calling the office for samples of medication:   1.  What medication and dosage are you requesting samples for? Xarelto   2.  Are you currently out of this medication? Out patient will be in town for wife's apt today at 1 please call patient.

## 2020-04-02 NOTE — Telephone Encounter (Addendum)
Xarelto 20 mg # 3 Lot # [id number] EXP 2/23 Advised patient, verbalized understanding

## 2020-04-12 ENCOUNTER — Other Ambulatory Visit: Payer: Self-pay

## 2020-04-12 ENCOUNTER — Ambulatory Visit (INDEPENDENT_AMBULATORY_CARE_PROVIDER_SITE_OTHER): Payer: Medicare Other | Admitting: Cardiology

## 2020-04-12 ENCOUNTER — Encounter: Payer: Self-pay | Admitting: Cardiology

## 2020-04-12 VITALS — BP 122/66 | HR 67 | Ht 67.0 in | Wt 232.4 lb

## 2020-04-12 DIAGNOSIS — H8110 Benign paroxysmal vertigo, unspecified ear: Secondary | ICD-10-CM | POA: Diagnosis not present

## 2020-04-12 DIAGNOSIS — Z7189 Other specified counseling: Secondary | ICD-10-CM

## 2020-04-12 DIAGNOSIS — I480 Paroxysmal atrial fibrillation: Secondary | ICD-10-CM

## 2020-04-12 DIAGNOSIS — E7800 Pure hypercholesterolemia, unspecified: Secondary | ICD-10-CM | POA: Diagnosis not present

## 2020-04-12 DIAGNOSIS — I10 Essential (primary) hypertension: Secondary | ICD-10-CM | POA: Diagnosis not present

## 2020-04-12 DIAGNOSIS — I2510 Atherosclerotic heart disease of native coronary artery without angina pectoris: Secondary | ICD-10-CM

## 2020-04-12 NOTE — Progress Notes (Signed)
Cardiology Office Note:    Date:  04/12/2020   ID:  [name], DOB 1950/08/06, MRN [id number]  PCP:  [name], MD  Cardiologist:  [name], MD PhD  Referring MD: [name], MD   CC: followup  History of Present Illness:    [name] is a 69 y.o. male with a hx of nonobstructive CAD, hypertension, hyperlipidemia, prior atrial fibrillation with RVR who is seen in follow up for the evaluation and management of afib, hypertension, hyperlipidemia.  [name] was hospitalized in [id number] due to complications from urinary catheter placed during recent shoulder surgery, but he was also found to have new onset atrial fibrillation with RVR. He spontaneously converted to NSR. He was discharged on rivaroxaban.  Today: Has been having vertigo. Notices when he turns his head in bed or when he moves while working on the car. Also noted while turning head while fishing. Feels like room is moving around him. Discussed BPPV today.  Has only had 1-2 episodes of sweating recently. Rare left sided chest discomfort, comes and goes. Uses [employer] routinely. Reviewed CT results again.  Xarelto is $400/month. Doesn't want to go back on coumadin. Discussed alternative DOAC. Will ask to see if PharmD has any additional suggestion.  Will have labs drawn with upcoming annual physical.  Denies shortness of breath at rest or with normal exertion. No PND, orthopnea, LE edema or unexpected weight gain. No syncope or palpitations.  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   Cataracts, both eyes    worst on left   Colon polyps     Complication of anesthesia    pt had spinal for a knee replacement--"woke up" during surgery--and sick after surgery   Depression    Diverticulosis    DVT (deep venous thrombosis) ([place]) 2001 or 2002   right leg-required hospitalization x 8 days   DVT (deep venous thrombosis) ([place]) 1996   right   Glaucoma    both eyes   H/O hiatal hernia    Hearing impaired person, bilateral    bilateral   Hemorrhoid    History of IBS    History of kidney stones    x 1-passed   Hyperlipidemia 2012   Hypertension    Lumbago    Lumbar post-laminectomy syndrome    PONV (postoperative nausea and vomiting)    Sleep apnea    pt uses cpap - setting of 16   Ulcer 1966   Ulcerative colitis ([place])    Wears glasses    Wears hearing aid     Past Surgical History:  Procedure Laterality Date   APPENDECTOMY  1993   BACK SURGERY  2008    bone spur removed    bilateral carpal tunnel release 2009     cervical fusion 2011--pt has good range of motion neck     CHOLECYSTECTOMY  1994   INSERTION OF MESH Bilateral 07/31/2016   Procedure: INSERTION OF MESH;  Surgeon: [name], MD;  Location: [facility];  Service: General;  Laterality: Bilateral;   JOINT REPLACEMENT Bilateral  2001 left total knee and 1985 right total knee   MASS EXCISION Left 01/04/2014   Procedure: LEFT MIDDLE FINGER MASS EXCISION WITH NAILBED REPAIR AND ADVANCEMENT FLAP;  Surgeon: [name], MD;  Location: [facility];  Service: Orthopedics;  Laterality: Left;   mass removed from 2nd finger Left aug 2014   squamous carcinoma   nissen fundoplication 8466     PROSTATE SURGERY  2014   TURP   right wrist fusion 12/12 - pt wearing brace on the wrist-not started physical therapy yet Right    fusion prevent limited ROM to right wrist   SPINE SURGERY  2009   cervical spine fusion   thyroid nodule     removed   TOTAL KNEE REVISION Right 11/17/2013   Procedure: RIGHT KNEE POLYETHYLENE  REVISION, bone graft;  Surgeon: [name], MD;  Location: [facility];  Service: Orthopedics;  Laterality: Right;   TOTAL SHOULDER ARTHROPLASTY Right 01/28/2018   Procedure: RIGHT TOTAL SHOULDER ARTHROPLASTY;  Surgeon: [name], MD;  Location: [facility];  Service: Orthopedics;  Laterality: Right;   TOTAL SHOULDER ARTHROPLASTY Left 07/15/2018   Procedure: LEFT SHOULDER ANATOMIC TOTAL SHOULDER ARTHROPLASTY;  Surgeon: [name], MD;  Location: [facility];  Service: Orthopedics;  Laterality: Left;   TRANSURETHRAL RESECTION OF PROSTATE  10/09/2011   Procedure: TRANSURETHRAL RESECTION OF THE PROSTATE WITH GYRUS INSTRUMENTS;  Surgeon: Fredricka Bonine, MD;  Location: [facility];  Service: Urology;  Laterality: N/A;   uvvvp     XI ROBOTIC ASSISTED INGUINAL HERNIA REPAIR WITH MESH Bilateral 07/31/2016   Procedure: XI ROBOTIC BILATERAL INGUINAL HERNIA REPAIR WITH MESH;  Surgeon: [name], MD;  Location: [facility];  Service: General;  Laterality: Bilateral;    Current Medications: Current Outpatient Medications on File Prior to Visit  Medication Sig   albuterol (VENTOLIN HFA) 108 (90 Base) MCG/ACT inhaler Inhale 2 puffs into the lungs every 4 (four) hours as needed for wheezing or shortness of breath.   Calcium Carbonate-Vitamin D (CALCIUM + D PO) Take 1 tablet by mouth at bedtime.    fentaNYL (DURAGESIC - DOSED MCG/HR) 25 MCG/HR Place 25 mcg onto the skin every 3 (three) days.    fluticasone (FLONASE) 50 MCG/ACT nasal spray Place 1 spray into both nostrils at bedtime as needed for allergies or rhinitis.    magnesium oxide (MAG-OX) 400 MG tablet Take 400 mg by mouth at bedtime.    Multiple Vitamin (MULTIVITAMIN WITH MINERALS) TABS tablet Take 1 tablet by mouth at bedtime.    Omega-3 Fatty Acids (FISH OIL) 500 MG CAPS Take 500 mg by mouth at bedtime.    PRESCRIPTION MEDICATION Inhale into the lungs at bedtime. CPAP   traZODone (DESYREL) 50 MG tablet Take 100 mg by mouth at bedtime.     vortioxetine HBr (TRINTELLIX) 10 MG TABS tablet Take 1 tablet (10 mg total) by mouth at bedtime.   XARELTO 20 MG TABS tablet TAKE 1 TABLET BY MOUTH ONCE DAILY WITH SUPPER   acetaminophen (TYLENOL) 500 MG tablet Take 1,000 mg by mouth every 6 (six) hours as needed for headache (pain).    No current facility-administered medications on file prior to visit.     Allergies:   Adhesive [tape]   Social History   Tobacco Use   Smoking status: Former Smoker    Packs/day: 1.00    Years: 19.00    Pack years: 19.00    Types: Cigarettes    Quit date: 08/04/1983    Years since  quitting: 36.7   Smokeless tobacco: Never Used   Tobacco comment: quit smoking 1985  Vaping Use   Vaping Use: Never used  Substance Use Topics   Alcohol use: Yes    Alcohol/week: 6.0 standard drinks    Types: 6 Cans of beer per week    Comment: maybe 2 or 3 beers per week   Drug use: No    Family History: The patient's family history includes Arthritis in his mother; Bone cancer in his father; Cancer in his father; Colon cancer in his father; Deep vein thrombosis in his mother; Diabetes in his father, paternal grandfather, and sister; Hearing loss in his maternal grandmother and mother; Hyperlipidemia in his mother; Hypertension in his mother; Prostate cancer in his father.  ROS:   Please see the history of present illness.  Additional pertinent ROS otherwise negative.   EKGs/Labs/Other Studies Reviewed:    The following studies were reviewed today: CT cardiac 05/25/19 FINDINGS: Coronary calcium score: The patient's coronary artery calcium score is 236, which places the patient in the 59 percentile.  Coronary arteries: Normal coronary origins.  Right dominance.  Right Coronary Artery: Dominant, large caliber vessel, gives rise to PDA and PLB. Proximal RCA has mixed calcified and noncalcified plaque with no more than 1-24% stenosis. Mid and distal RC has scattered mixed plaque but no significant  stenosis.  Left Main Coronary Artery: Trivial calcified plaque, no significant stenosis.  Left Anterior Descending Coronary Artery: Scattered diffuse mixed calcified and noncalcified plaque in proximal LAD, with no more than 1-24% stenosis. Gives rise to 1 small and 1 medium caliber diagonal branches. Mid to distal LAD without significant plaque or stenosis, but caliber becomes small after D2 branch. Distal LAD wraps LV apex.  Left Circumflex Artery: Proximal LCX has mixed calcified and noncalcified plaque with no more than 1-24% stenosis. Gives rise to 2 large caliber OM branches.  Aorta: Normal size, 34 mm at the mid ascending aorta (level of the PA bifurcation) measured double oblique. Mild calcifications. No dissection.  Aortic Valve: No calcifications. Trileaflet.  Other findings:  Normal pulmonary vein drainage into the left atrium.  Normal left atrial appendage without a thrombus.  Normal size of the pulmonary artery.  IMPRESSION: 1.  Scattered nonobstructive CAD, CADRADS = 1.  2. Coronary calcium score of 236. This was 59th percentile for age and sex matched control.  3. Normal coronary origin with right dominance.  Monitor 08/02/18 ~21 days of data recorded on [employer] monitor. Patient had a min HR of 50 bpm, max HR of 181 bpm, and avg HR of 70 bpm. Predominant underlying rhythm was Sinus Rhythm. There was one episode of rapid atrial fibrillation on 08/06/2018 with a rate of 181 bpm. Total time in atrial fibrillation was 2 hrs 33 min, longest event was 13 minutes. AF occurred 38 time(s) with HR range of 75 - 181; Total AF Burden 1%  No VT, SVT, high degree block, or pauses noted. Isolated atrial and ventricular ectopy was rare (<1%). There were 16 symptomatic and 22 asymptomatic triggered events. All symptomatic events were either normal sinus rhythm, sinus tachycardia, or sinus rhythm with a PAC.  Echo 07/19/18 - Left ventricle: The cavity size was  normal. Systolic function was   normal. The estimated ejection fraction was in the range of 60%   to 65%. Wall motion was normal; there were no regional wall   motion abnormalities. Features are consistent with a pseudonormal   left ventricular filling pattern, with concomitant abnormal  relaxation and increased filling pressure (grade 2 diastolic   dysfunction). There was no evidence of elevated ventricular   filling pressure by Doppler parameters. - Aortic root: The aortic root was normal in size. - Left atrium: The atrium was moderately dilated. - Right ventricle: The cavity size was normal. Wall thickness was   normal. Systolic function was normal. - Right atrium: The atrium was normal in size. - Tricuspid valve: There was mild regurgitation. - Pulmonary arteries: Systolic pressure was within the normal   range. - Inferior vena cava: The vessel was dilated. The respirophasic   diameter changes were blunted (< 50%), consistent with elevated   central venous pressure. - Pericardium, extracardiac: There was no pericardial effusion.  MPI 2007--I cannot see complete results  EKG:  EKG is personally reviewed.  The ekg from 08/25/18 demonstrates sinus bradycardia at 55 bpm.  Recent Labs: 05/25/2019: ALT 38; BUN 14; Creat 0.96; Hemoglobin 16.0; Platelets 175; Potassium 4.6; Sodium 141; TSH 3.48  Recent Lipid Panel    Component Value Date/Time   CHOL 148 05/25/2019 0854   TRIG 72 05/25/2019 0854   HDL 58 05/25/2019 0854   CHOLHDL 2.6 05/25/2019 0854   VLDL 10 07/19/2018 0323   LDLCALC 75 05/25/2019 0854    Physical Exam:    VS:  BP 122/66    Pulse 67    Ht 5' 7"  (1.702 m)    Wt 232 lb 6.4 oz (105.4 kg)    SpO2 96%    BMI 36.40 kg/m     Wt Readings from Last 3 Encounters:  04/12/20 232 lb 6.4 oz (105.4 kg)  10/03/19 241 lb 6.4 oz (109.5 kg)  09/26/19 242 lb 12.8 oz (110.1 kg)    GEN: Well nourished, well developed in no acute distress HEENT: Normal, moist mucous  membranes NECK: No JVD CARDIAC: regular rhythm, normal S1 and S2, no rubs or gallops. No murmur. VASCULAR: Radial and DP pulses 2+ bilaterally. No carotid bruits RESPIRATORY:  Clear to auscultation without rales, wheezing or rhonchi  ABDOMEN: Soft, non-tender, non-distended MUSCULOSKELETAL:  Ambulates independently SKIN: Warm and dry, no edema NEUROLOGIC:  Alert and oriented x 3. No focal neuro deficits noted. PSYCHIATRIC:  Normal affect   ASSESSMENT:    1. Nonocclusive coronary atherosclerosis of native coronary artery   2. Benign paroxysmal positional vertigo, unspecified laterality   3. Paroxysmal atrial fibrillation (HCC)   4. Pure hypercholesterolemia   5. Essential hypertension   6. Cardiac risk counseling   7. Counseling on health promotion and disease prevention    PLAN:    Nonobstructive CAD: based on results of CT cardiac for chest pain -on atorvastatin 40 mg daily -no aspirin as he is on rivaroxaban -counseled on red flag warning signs that need immediate medical attention  Atrial fibrillation, with RVR in hospital and spontaneous conversion: -CHA2DS2/VAS Stroke Risk Points=3 (HTN, HLD, age)  -on rivaroxaban for anticoagulation. This is expensive, but he wants to avoid return to coumadin -regular rhythm today -asymptomatic -has instructions for PRN metoprolol   Hypertension: at goal of <130/80 on no medications -if elevates above goal in the future, did well on lisinopril, would restart  Hypercholesterolemia: reviewed lipids in [facility]. 05/25/19, Tchol 148, HDL 58, LDL 75, TG 72 -LDL goal <70 with nonobstructive CAD -continue atorvastatin 40 mg -working on lifestyle, as below -has upcoming labs with PCP for annual physical  Lifestyle recommendations -recommend heart healthy/Mediterranean diet, with whole grains, fruits, vegetable, fish, lean meats, nuts, and olive oil. Limit salt. -  recommend moderate walking, 3-5 times/week for 30-50 minutes each session. Aim for  at least 150 minutes.week. Goal should be pace of 3 miles/hours, or walking 1.5 miles in 30 minutes -recommend avoidance of tobacco products. Avoid excess alcohol.  Plan for follow up: 1 year or sooner as needed  [name], MD, PhD [place]   [facility]   Medication Adjustments/Labs and Tests Ordered: Current medicines are reviewed at length with the patient today.  Concerns regarding medicines are outlined above.  No orders of the defined types were placed in this encounter.  No orders of the defined types were placed in this encounter.   Patient Instructions  Medication Instructions:  Your Physician recommend you continue on your current medication as directed.    *If you need a refill on your cardiac medications before your next appointment, please call your pharmacy*   Lab Work: None ordered   Testing/Procedures: None ordered    Follow-Up: At [facility], you and your health needs are our priority.  As part of our continuing mission to provide you with exceptional heart care, we have created designated Provider Care Teams.  These Care Teams include your primary Cardiologist (physician) and Advanced Practice Providers (APPs -  Physician Assistants and Nurse Practitioners) who all work together to provide you with the care you need, when you need it.  We recommend signing up for the patient portal called "MyChart".  Sign up information is provided on this After Visit Summary.  MyChart is used to connect with patients for Virtual Visits (Telemedicine).  Patients are able to view lab/test results, encounter notes, upcoming appointments, etc.  Non-urgent messages can be sent to your provider as well.   To learn more about what you can do with MyChart, go to [email or web address].    Your next appointment:   1 year(s)  The format for your next appointment:   In Person  Provider:   [name], MD      Signed, [name], MD  PhD 04/12/2020  [place]

## 2020-04-12 NOTE — Patient Instructions (Signed)
Medication Instructions:  Your Physician recommend you continue on your current medication as directed.    *If you need a refill on your cardiac medications before your next appointment, please call your pharmacy*   Lab Work: None ordered   Testing/Procedures: None ordered    Follow-Up: At [facility], you and your health needs are our priority.  As part of our continuing mission to provide you with exceptional heart care, we have created designated Provider Care Teams.  These Care Teams include your primary Cardiologist (physician) and Advanced Practice Providers (APPs -  Physician Assistants and Nurse Practitioners) who all work together to provide you with the care you need, when you need it.  We recommend signing up for the patient portal called "MyChart".  Sign up information is provided on this After Visit Summary.  MyChart is used to connect with patients for Virtual Visits (Telemedicine).  Patients are able to view lab/test results, encounter notes, upcoming appointments, etc.  Non-urgent messages can be sent to your provider as well.   To learn more about what you can do with MyChart, go to [email or web address].    Your next appointment:   1 year(s)  The format for your next appointment:   In Person  Provider:   [name], MD

## 2020-04-23 ENCOUNTER — Other Ambulatory Visit: Payer: Self-pay | Admitting: Family Medicine

## 2020-05-01 ENCOUNTER — Other Ambulatory Visit: Payer: Self-pay

## 2020-05-01 ENCOUNTER — Ambulatory Visit (INDEPENDENT_AMBULATORY_CARE_PROVIDER_SITE_OTHER): Payer: Medicare Other

## 2020-05-01 DIAGNOSIS — Z23 Encounter for immunization: Secondary | ICD-10-CM

## 2020-05-06 ENCOUNTER — Encounter: Payer: Self-pay | Admitting: Family Medicine

## 2020-05-06 ENCOUNTER — Other Ambulatory Visit: Payer: Self-pay

## 2020-05-06 ENCOUNTER — Ambulatory Visit (INDEPENDENT_AMBULATORY_CARE_PROVIDER_SITE_OTHER): Payer: Medicare Other | Admitting: Family Medicine

## 2020-05-06 VITALS — BP 120/58 | HR 66 | Temp 98.0°F | Ht 67.0 in | Wt 234.0 lb

## 2020-05-06 DIAGNOSIS — H8110 Benign paroxysmal vertigo, unspecified ear: Secondary | ICD-10-CM

## 2020-05-06 DIAGNOSIS — I2510 Atherosclerotic heart disease of native coronary artery without angina pectoris: Secondary | ICD-10-CM | POA: Diagnosis not present

## 2020-05-06 MED ORDER — MECLIZINE HCL 25 MG PO TABS: 25.0000 mg | ORAL_TABLET | Freq: Three times a day (TID) | ORAL | 0 refills | Status: DC | PRN

## 2020-05-06 NOTE — Progress Notes (Signed)
Subjective:    Patient ID: [name], male    DOB: November 08, 1950, 69 y.o.   MRN: [id number]  HPI Patient is a very pleasant 69 year old Caucasian male who reports feeling dizzy for the last 3 weeks.  He states that if he lays flat on his back to work underneath his truck or if he turns on his side to lie in bed, the room will start to spin.  I will continue to span for a few minutes and then will subside spontaneously.  He feels nauseated when it happens.  It only occurs when he lies flat on his back or turns over in bed to sleep on his side.  He denies any vertigo if he holds still.  He denies any syncope or presyncope.  He denies any hearing loss or tinnitus or headaches. Past Medical History:  Diagnosis Date  . Arthritis    oa and ddd  . Back pain, chronic    pt states he has 3 herniated disks--uses fentanyl patch for pain  . Blood transfusion without reported diagnosis 1950   rH negative"at birth only"  . BPH (benign prostatic hyperplasia)    frequent urination, not able to hold urine well  . 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 Cataracts, both eyes    worst on left  . Colon polyps   . Complication of anesthesia    pt had spinal for a knee replacement--"woke up" during surgery--and sick after surgery  . Depression   . Diverticulosis   . DVT (deep venous thrombosis) ([place]) 2001 or 2002   right leg-required hospitalization x 8 days  . DVT (deep venous thrombosis) ([place]) 1996   right  . Glaucoma    both eyes  . H/O hiatal hernia   . Hearing impaired person, bilateral    bilateral  . Hemorrhoid   . History of IBS   . History of kidney stones    x 1-passed  . Hyperlipidemia 2012  . Hypertension   . Lumbago   . Lumbar post-laminectomy syndrome   . PONV (postoperative nausea and vomiting)   . Sleep apnea    pt uses cpap - setting of 16  . Ulcer 1966  . Ulcerative colitis ([place])   . Wears glasses   . Wears hearing aid    Past Surgical History:    Procedure Laterality Date  . APPENDECTOMY  1993  . BACK SURGERY  2008    bone spur removed   . bilateral carpal tunnel release 2009    . cervical fusion 2011--pt has good range of motion neck    . CHOLECYSTECTOMY  1994  . INSERTION OF MESH Bilateral 07/31/2016   Procedure: INSERTION OF MESH;  Surgeon: [name], MD;  Location: [facility];  Service: General;  Laterality: Bilateral;  . JOINT REPLACEMENT Bilateral    2001 left total knee and 1985 right total knee  . MASS EXCISION Left 01/04/2014   Procedure: LEFT MIDDLE FINGER MASS EXCISION WITH NAILBED REPAIR AND ADVANCEMENT FLAP;  Surgeon: [name], MD;  Location: [facility];  Service: Orthopedics;  Laterality: Left;  . mass removed from 2nd finger Left aug 2014   squamous carcinoma  . nissen fundoplication 8921    . PROSTATE SURGERY  2014   TURP  . right wrist fusion 12/12 - pt wearing brace on the wrist-not started physical therapy yet Right    fusion prevent limited ROM to right wrist  . SPINE SURGERY  2009   cervical spine fusion  . thyroid nodule     removed  . TOTAL KNEE REVISION Right 11/17/2013   Procedure: RIGHT KNEE POLYETHYLENE REVISION, bone graft;  Surgeon: [name], MD;  Location: [facility];  Service: Orthopedics;  Laterality: Right;  . TOTAL SHOULDER ARTHROPLASTY Right 01/28/2018   Procedure: RIGHT TOTAL SHOULDER ARTHROPLASTY;  Surgeon: [name], MD;  Location: [facility];  Service: Orthopedics;  Laterality: Right;  . TOTAL SHOULDER ARTHROPLASTY Left 07/15/2018   Procedure: LEFT SHOULDER ANATOMIC TOTAL SHOULDER ARTHROPLASTY;  Surgeon: [name], MD;  Location: [facility];  Service: Orthopedics;  Laterality: Left;  . TRANSURETHRAL RESECTION OF PROSTATE  10/09/2011   Procedure: TRANSURETHRAL RESECTION OF THE PROSTATE WITH GYRUS INSTRUMENTS;  Surgeon: Fredricka Bonine, MD;  Location: [facility];  Service: Urology;  Laterality: N/A;  . uvvvp    . XI ROBOTIC ASSISTED INGUINAL HERNIA REPAIR WITH MESH  Bilateral 07/31/2016   Procedure: XI ROBOTIC BILATERAL INGUINAL HERNIA REPAIR WITH MESH;  Surgeon: [name], MD;  Location: [facility];  Service: General;  Laterality: Bilateral;   Current Outpatient Medications on File Prior to Visit  Medication Sig Dispense Refill  . albuterol (VENTOLIN HFA) 108 (90 Base) MCG/ACT inhaler Inhale 2 puffs into the lungs every 4 (four) hours as needed for wheezing or shortness of breath. 18 g 2  . atorvastatin (LIPITOR) 40 MG tablet Take 1 tablet by mouth once daily 30 tablet 0  . Calcium Carbonate-Vitamin D (CALCIUM + D PO) Take 1 tablet by mouth at bedtime.     . fentaNYL (DURAGESIC - DOSED MCG/HR) 25 MCG/HR Place 25 mcg onto the skin every 3 (three) days.     . fluticasone (FLONASE) 50 MCG/ACT nasal spray Place 1 spray into both nostrils at bedtime as needed for allergies or rhinitis.     . magnesium oxide (MAG-OX) 400 MG tablet Take 400 mg by mouth at bedtime.     . Multiple Vitamin (MULTIVITAMIN WITH MINERALS) TABS tablet Take 1 tablet by mouth at bedtime.     . Omega-3 Fatty Acids (FISH OIL) 500 MG CAPS Take 500 mg by mouth at bedtime.     Marland Kitchen PRESCRIPTION MEDICATION Inhale into the lungs at bedtime. CPAP    . traZODone (DESYREL) 50 MG tablet Take 100 mg by mouth at bedtime.     . vortioxetine HBr (TRINTELLIX) 10 MG TABS tablet Take 1 tablet (10 mg total) by mouth at bedtime. 30 tablet 5  . XARELTO 20 MG TABS tablet TAKE 1 TABLET BY MOUTH ONCE DAILY WITH SUPPER 90 tablet 1   No current facility-administered medications on file prior to visit.   Allergies  Allergen Reactions  . Adhesive [Tape] Other (See Comments)    Causes blisters - pls use paper tape   Social History   Socioeconomic History  . Marital status: Married    Spouse name: Not on file  . Number of children: Not on file  . Years of education: Not on file  . Highest education level: Not on file  Occupational History  . Occupation: disabled  Tobacco Use  . Smoking status: Former  Smoker    Packs/day: 1.00    Years: 19.00    Pack years: 19.00    Types: Cigarettes    Quit date: 08/04/1983    Years since quitting: 36.7  . Smokeless tobacco: Never Used  . Tobacco comment: quit smoking 1985  Vaping Use  . Vaping Use: Never used  Substance and Sexual Activity  .  Alcohol use: Yes    Alcohol/week: 6.0 standard drinks    Types: 6 Cans of beer per week    Comment: maybe 2 or 3 beers per week  . Drug use: No  . Sexual activity: Not Currently  Other Topics Concern  . Not on file  Social History Narrative  . Not on file   Social Determinants of Health   Financial Resource Strain: Low Risk   . Difficulty of Paying Living Expenses: Not very hard  Food Insecurity:   . Worried About Charity fundraiser in the Last Year: Not on file  . Ran Out of Food in the Last Year: Not on file  Transportation Needs:   . Lack of Transportation (Medical): Not on file  . Lack of Transportation (Non-Medical): Not on file  Physical Activity:   . Days of Exercise per Week: Not on file  . Minutes of Exercise per Session: Not on file  Stress:   . Feeling of Stress : Not on file  Social Connections:   . Frequency of Communication with Friends and Family: Not on file  . Frequency of Social Gatherings with Friends and Family: Not on file  . Attends Religious Services: Not on file  . Active Member of Clubs or Organizations: Not on file  . Attends Archivist Meetings: Not on file  . Marital Status: Not on file  Intimate Partner Violence:   . Fear of Current or Ex-Partner: Not on file  . Emotionally Abused: Not on file  . Physically Abused: Not on file  . Sexually Abused: Not on file   Family History  Problem Relation Age of Onset  . Colon cancer Father   . Bone cancer Father        deceased  . Cancer Father   . Diabetes Father   . Prostate cancer Father   . Arthritis Mother   . Hearing loss Mother   . Hyperlipidemia Mother   . Hypertension Mother   . Deep vein  thrombosis Mother   . Diabetes Sister   . Hearing loss Maternal Grandmother   . Diabetes Paternal Grandfather      Review of Systems  Neurological: Positive for dizziness.  All other systems reviewed and are negative.      Objective:   Physical Exam Vitals reviewed.  Constitutional:      General: He is not in acute distress.    Appearance: He is well-developed. He is not diaphoretic.  HENT:     Head: Normocephalic and atraumatic.     Right Ear: External ear normal.     Left Ear: External ear normal.     Nose: Nose normal.     Mouth/Throat:     Pharynx: No oropharyngeal exudate.  Eyes:     General: No scleral icterus.       Right eye: No discharge.        Left eye: No discharge.     Conjunctiva/sclera: Conjunctivae normal.     Pupils: Pupils are equal, round, and reactive to light.  Neck:     Thyroid: No thyromegaly.     Vascular: No JVD.     Trachea: No tracheal deviation.  Cardiovascular:     Rate and Rhythm: Normal rate and regular rhythm.     Heart sounds: Normal heart sounds. No murmur heard.  No friction rub. No gallop.   Pulmonary:     Effort: Pulmonary effort is normal. No respiratory distress.     Breath sounds:  No stridor. No wheezing, rhonchi or rales.  Chest:     Chest wall: No tenderness.  Abdominal:     General: Bowel sounds are normal. There is no distension.     Palpations: Abdomen is soft. There is no mass.     Tenderness: There is no abdominal tenderness. There is no guarding or rebound.  Musculoskeletal:        General: No tenderness or deformity. Normal range of motion.     Cervical back: Normal range of motion and neck supple.  Lymphadenopathy:     Cervical: No cervical adenopathy.  Skin:    General: Skin is warm.     Coloration: Skin is not pale.     Findings: No erythema or rash.  Neurological:     Mental Status: He is alert and oriented to person, place, and time.     Cranial Nerves: No cranial nerve deficit.     Motor: No abnormal  muscle tone.     Coordination: Coordination normal.     Deep Tendon Reflexes: Reflexes are normal and symmetric.  Psychiatric:        Behavior: Behavior normal.        Thought Content: Thought content normal.        Judgment: Judgment normal.           Assessment & Plan:  Benign paroxysmal positional vertigo, unspecified laterality   Spent 15 minutes today with the patient explaining the causes of benign paroxysmal positional vertigo.  Recommended meclizine 25 mg every 8 hours as needed.  Anticipate spontaneous improvement over the next 2 to 3 weeks.  If persistent, refer for Epley maneuvers.

## 2020-05-07 ENCOUNTER — Other Ambulatory Visit: Payer: Self-pay | Admitting: Cardiology

## 2020-05-07 MED ORDER — RIVAROXABAN 20 MG PO TABS: ORAL_TABLET | ORAL | 11 refills | Status: DC

## 2020-05-07 NOTE — Telephone Encounter (Signed)
Patient calling the office for samples of medication:   1.  What medication and dosage are you requesting samples for? Xarelto  2.  Are you currently out of this medication?  He is needs 3 weeks supply if possible, pt is in the donut

## 2020-05-07 NOTE — Telephone Encounter (Signed)
Spoke with the patient's wife who states that they are leaving to go out of town next week so she is wondering if she could pick up a 3 week supply of xarelto to last the patient until he returns from vacation.

## 2020-05-07 NOTE — Telephone Encounter (Signed)
2 week sample given

## 2020-06-03 ENCOUNTER — Encounter: Payer: Self-pay | Admitting: Cardiology

## 2020-06-03 ENCOUNTER — Other Ambulatory Visit: Payer: Self-pay | Admitting: Family Medicine

## 2020-06-03 DIAGNOSIS — H401131 Primary open-angle glaucoma, bilateral, mild stage: Secondary | ICD-10-CM | POA: Diagnosis not present

## 2020-06-06 DIAGNOSIS — Z23 Encounter for immunization: Secondary | ICD-10-CM | POA: Diagnosis not present

## 2020-06-11 ENCOUNTER — Ambulatory Visit: Payer: Medicare Other | Admitting: Pharmacist

## 2020-06-11 DIAGNOSIS — I10 Essential (primary) hypertension: Secondary | ICD-10-CM

## 2020-06-11 DIAGNOSIS — E785 Hyperlipidemia, unspecified: Secondary | ICD-10-CM

## 2020-06-11 NOTE — Patient Instructions (Addendum)
Visit Information  Goals Addressed            This Visit's Progress    High Blood Pressure - < 130/90       CARE PLAN ENTRY (see longitudinal plan of care for additional care plan information)  Current Barriers:   Controlled hypertension, complicated by hyperlipidemia, afib, and GERD.  Current antihypertensive regimen: none  Previous antihypertensives tried: lisinopril  Last practice recorded BP readings:  BP Readings from Last 3 Encounters:  05/06/20 (!) 120/58  04/12/20 122/66  10/03/19 136/72    Current home BP readings: 109/70s average at home lately   Pharmacist Clinical Goal(s):   Over the next 6 months, patient will work with PharmD and providers to optimize antihypertensive regimen  Interventions:  Inter-disciplinary care team collaboration (see longitudinal plan of care)  Comprehensive medication review performed; medication list updated in the electronic medical record.   Continue to monitor blood pressure at least once daily and report any consistent readings > 130/80 to PharmD or PCP.  Patient Self Care Activities:   Patient will continue to check BP at least once daily , document, and provide at future appointments  Patient will focus on medication adherence by using pill box.  Over the next 6 months patient is to report any consistent blood pressure readings > 130/90 to PharmD or PCP.  Please see past updates related to this goal by clicking on the "Past Updates" button in the selected goal       High Cholesterol - LDL < 70       CARE PLAN ENTRY (see longitudinal plan of care for additional care plan information)  Current Barriers:   Uncontrolled hyperlipidemia, complicated by hypertension, afib, and GERD.  Current antihyperlipidemic regimen: atorvastatin 58m  Previous antihyperlipidemic medications tried: none  Most recent lipid panel:     Component Value Date/Time   CHOL 148 05/25/2019 0854   TRIG 72 05/25/2019 0854   HDL 58  05/25/2019 0854   CHOLHDL 2.6 05/25/2019 0854   VLDL 10 07/19/2018 0323   LDLCALC 75 05/25/2019 0854     ASCVD risk enhancing conditions: age >>79 DM, HTN, CKD, CHF, current smoker  10-year ASCVD risk score: 14.3%  Pharmacist Clinical Goal(s):   Over the next 6 months, patient will work with PharmD and providers towards optimized antihyperlipidemic therapy  Interventions:  Comprehensive medication review performed; medication list updated in electronic medical record.   Inter-disciplinary care team collaboration (see longitudinal plan of care)  Schedule annual physical with lipid panel ASAP.  Patient Self Care Activities:   Patient will focus on medication adherence by using a pill box.  Over the next 6 months patient is to schedule appointment for physical with Dr. PDennard Schaumannto get updated lipid panel to evaluate current medication therapy.  Please see past updates related to this goal by clicking on the "Past Updates" button in the selected goal         The patient verbalized understanding of instructions provided today and agreed to receive a mailed copy of patient instruction and/or educational materials.  Telephone follow up appointment with pharmacy team member scheduled for: 6 months  CBeverly Milch PharmD Clinical Pharmacist [facility] [phone]  High Cholesterol  High cholesterol is a condition in which the blood has high levels of a white, waxy, fat-like substance (cholesterol). The human body needs small amounts of cholesterol. The liver makes all the cholesterol that the body needs. Extra (excess) cholesterol comes from the food  that we eat. Cholesterol is carried from the liver by the blood through the blood vessels. If you have high cholesterol, deposits (plaques) may build up on the walls of your blood vessels (arteries). Plaques make the arteries narrower and stiffer. Cholesterol plaques increase your risk for heart attack and  stroke. Work with your health care provider to keep your cholesterol levels in a healthy range. What increases the risk? This condition is more likely to develop in people who:  Eat foods that are high in animal fat (saturated fat) or cholesterol.  Are overweight.  Are not getting enough exercise.  Have a family history of high cholesterol. What are the signs or symptoms? There are no symptoms of this condition. How is this diagnosed? This condition may be diagnosed from the results of a blood test.  If you are older than age 73, your health care provider may check your cholesterol every 4-6 years.  You may be checked more often if you already have high cholesterol or other risk factors for heart disease. The blood test for cholesterol measures:  "Bad" cholesterol (LDL cholesterol). This is the main type of cholesterol that causes heart disease. The desired level for LDL is less than 100.  "Good" cholesterol (HDL cholesterol). This type helps to protect against heart disease by cleaning the arteries and carrying the LDL away. The desired level for HDL is 60 or higher.  Triglycerides. These are fats that the body can store or burn for energy. The desired number for triglycerides is lower than 150.  Total cholesterol. This is a measure of the total amount of cholesterol in your blood, including LDL cholesterol, HDL cholesterol, and triglycerides. A healthy number is less than 200. How is this treated? This condition is treated with diet changes, lifestyle changes, and medicines. Diet changes  This may include eating more whole grains, fruits, vegetables, nuts, and fish.  This may also include cutting back on red meat and foods that have a lot of added sugar. Lifestyle changes  Changes may include getting at least 40 minutes of aerobic exercise 3 times a week. Aerobic exercises include walking, biking, and swimming. Aerobic exercise along with a healthy diet can help you maintain a  healthy weight.  Changes may also include quitting smoking. Medicines  Medicines are usually given if diet and lifestyle changes have failed to reduce your cholesterol to healthy levels.  Your health care provider may prescribe a statin medicine. Statin medicines have been shown to reduce cholesterol, which can reduce the risk of heart disease. Follow these instructions at home: Eating and drinking If told by your health care provider:  Eat chicken (without skin), fish, veal, shellfish, ground [place] breast, and round or loin cuts of red meat.  Do not eat fried foods or fatty meats, such as hot dogs and salami.  Eat plenty of fruits, such as apples.  Eat plenty of vegetables, such as broccoli, potatoes, and carrots.  Eat beans, peas, and lentils.  Eat grains such as barley, rice, couscous, and bulgur wheat.  Eat pasta without cream sauces.  Use skim or nonfat milk, and eat low-fat or nonfat yogurt and cheeses.  Do not eat or drink whole milk, cream, ice cream, egg yolks, or hard cheeses.  Do not eat stick margarine or tub margarines that contain trans fats (also called partially hydrogenated oils).  Do not eat saturated tropical oils, such as coconut oil and palm oil.  Do not eat cakes, cookies, crackers, or other baked goods  that contain trans fats.  General instructions  Exercise as directed by your health care provider. Increase your activity level with activities such as gardening, walking, and taking the stairs.  Take over-the-counter and prescription medicines only as told by your health care provider.  Do not use any products that contain nicotine or tobacco, such as cigarettes and e-cigarettes. If you need help quitting, ask your health care provider.  Keep all follow-up visits as told by your health care provider. This is important. Contact a health care provider if:  You are struggling to maintain a healthy diet or weight.  You need help to start on an  exercise program.  You need help to stop smoking. Get help right away if:  You have chest pain.  You have trouble breathing. This information is not intended to replace advice given to you by your health care provider. Make sure you discuss any questions you have with your health care provider. Document Revised: 07/23/2017 Document Reviewed: 01/18/2016 Elsevier Patient Education  [place].

## 2020-06-11 NOTE — Chronic Care Management (AMB) (Addendum)
Chronic Care Management   Follow Up Note   06/11/2020 Name: [name] MRN: [id number] DOB: 03-25-1951  Referred by: [name], MD Reason for referral : Chronic Care Management (Pharmd follow up visit)   [name] is a 69 y.o. year old male who is a primary care patient of Pickard, Cammie Mcgee, MD. The CCM team was consulted for assistance with chronic disease management and care coordination needs.    Review of patient status, including review of consultants reports, relevant laboratory and other test results, and collaboration with appropriate care team members and the patient's provider was performed as part of comprehensive patient evaluation and provision of chronic care management services.    SDOH (Social Determinants of Health) assessments performed: No See Care Plan activities for detailed interventions related to [facility])      Outpatient Encounter Medications as of 06/11/2020  Medication Sig   albuterol (VENTOLIN HFA) 108 (90 Base) MCG/ACT inhaler Inhale 2 puffs into the lungs every 4 (four) hours as needed for wheezing or shortness of breath.   atorvastatin (LIPITOR) 40 MG tablet Take 1 tablet by mouth once daily   Calcium Carbonate-Vitamin D (CALCIUM + D PO) Take 1 tablet by mouth at bedtime.    fentaNYL (DURAGESIC - DOSED MCG/HR) 25 MCG/HR Place 25 mcg onto the skin every 3 (three) days.    fluticasone (FLONASE) 50 MCG/ACT nasal spray Place 1 spray into both nostrils at bedtime as needed for allergies or rhinitis.    magnesium oxide (MAG-OX) 400 MG tablet Take 400 mg by mouth at bedtime.    meclizine (ANTIVERT) 25 MG tablet Take 1 tablet (25 mg total) by mouth 3 (three) times daily as needed for dizziness.   Multiple Vitamin (MULTIVITAMIN WITH MINERALS) TABS tablet Take 1 tablet by mouth at bedtime.    Omega-3 Fatty Acids (FISH OIL) 500 MG CAPS Take 500 mg by mouth at bedtime.    PRESCRIPTION MEDICATION Inhale into the lungs at bedtime. CPAP   rivaroxaban  (XARELTO) 20 MG TABS tablet TAKE 1 TABLET BY MOUTH ONCE DAILY WITH SUPPER   traZODone (DESYREL) 50 MG tablet Take 100 mg by mouth at bedtime.    vortioxetine HBr (TRINTELLIX) 10 MG TABS tablet Take 1 tablet (10 mg total) by mouth at bedtime.   No facility-administered encounter medications on file as of 06/11/2020.     Goals Addressed             This Visit's Progress    High Blood Pressure - < 130/90       CARE PLAN ENTRY (see longitudinal plan of care for additional care plan information)  Current Barriers:  Controlled hypertension, complicated by hyperlipidemia, afib, and GERD. Current antihypertensive regimen: none Previous antihypertensives tried: lisinopril Last practice recorded BP readings:  BP Readings from Last 3 Encounters:  05/06/20 (!) 120/58  04/12/20 122/66  10/03/19 136/72   Current home BP readings: 109/70s average at home lately   Pharmacist Clinical Goal(s):  Over the next 6 months, patient will work with PharmD and providers to optimize antihypertensive regimen  Interventions: Inter-disciplinary care team collaboration (see longitudinal plan of care) Comprehensive medication review performed; medication list updated in the electronic medical record.  Continue to monitor blood pressure at least once daily and report any consistent readings > 130/80 to PharmD or PCP.  Patient Self Care Activities:  Patient will continue to check BP at least once daily , document, and provide at future appointments Patient will focus on medication  adherence by using pill box. Over the next 6 months patient is to report any consistent blood pressure readings > 130/90 to PharmD or PCP.  Please see past updates related to this goal by clicking on the "Past Updates" button in the selected goal       High Cholesterol - LDL < 70       CARE PLAN ENTRY (see longitudinal plan of care for additional care plan information)  Current Barriers:  Uncontrolled hyperlipidemia,  complicated by hypertension, afib, and GERD. Current antihyperlipidemic regimen: atorvastatin 23m Previous antihyperlipidemic medications tried: none Most recent lipid panel:     Component Value Date/Time   CHOL 148 05/25/2019 0854   TRIG 72 05/25/2019 0854   HDL 58 05/25/2019 0854   CHOLHDL 2.6 05/25/2019 0854   VLDL 10 07/19/2018 0323   LDLCALC 75 05/25/2019 0854   ASCVD risk enhancing conditions: age >>36 DM, HTN, CKD, CHF, current smoker 10-year ASCVD risk score: 14.3%  Pharmacist Clinical Goal(s):  Over the next 6 months, patient will work with PharmD and providers towards optimized antihyperlipidemic therapy  Interventions: Comprehensive medication review performed; medication list updated in electronic medical record.  Inter-disciplinary care team collaboration (see longitudinal plan of care) Schedule annual physical with lipid panel ASAP.  Patient Self Care Activities:  Patient will focus on medication adherence by using a pill box. Over the next 6 months patient is to schedule appointment for physical with Dr. PDennard Schaumannto get updated lipid panel to evaluate current medication therapy.  Please see past updates related to this goal by clicking on the "Past Updates" button in the selected goal         Hypertension   BP goal is:  <130/80  Office blood pressures are  BP Readings from Last 3 Encounters:  05/06/20 (!) 120/58  04/12/20 122/66  10/03/19 136/72   Patient checks BP at home daily Patient home BP readings are ranging: 109/70s  Patient has failed these meds in the past: none noted Patient is currently controlled on the following medications:  No medications at this time  We discussed  Denies dizziness lately Still monitoring BP daily, denies headaches Active doing things around the house, no concerns at this time  Plan  Continue current medications     Hyperlipidemia   LDL goal < 70  Last lipids Lab Results  Component Value Date   CHOL  148 05/25/2019   HDL 58 05/25/2019   LDLCALC 75 05/25/2019   TRIG 72 05/25/2019   CHOLHDL 2.6 05/25/2019   Hepatic Function Latest Ref Rng & Units 05/25/2019 07/19/2018 04/07/2018  Total Protein 6.1 - 8.1 g/dL 6.8 6.3(L) 6.9  Albumin 3.5 - 5.0 g/dL - 3.1(L) -  AST 10 - 35 U/L 25 27 26   ALT 9 - 46 U/L 38 27 27  Alk Phosphatase 38 - 126 U/L - 50 -  Total Bilirubin 0.2 - 1.2 mg/dL 0.8 1.0 0.9  Bilirubin, Direct 0.0 - 0.3 mg/dL - - -     The 10-year ASCVD risk score (Mikey BussingDC Jr., et al., 2013) is: 14.3%   Values used to calculate the score:     Age: [age]years     Sex: Male     Is Non-Hispanic African American: No     Diabetic: No     Tobacco smoker: No     Systolic Blood Pressure: 1782mmHg     Is BP treated: No     HDL Cholesterol: 58 mg/dL  Total Cholesterol: 148 mg/dL   Patient has failed these meds in past: none noted Patient is currently controlled on the following medications:  Atorvastatin 107m  We discussed:   He has yet to have labs updated for this year, recommended he schedule a CPE with Dr. PDennard Schaumannto update lipid panel and other labwork LDL controlled at last visit Still taking medication as directed, denies myalgias  Plan  Continue current medications   CBeverly Milch PharmD Clinical Pharmacist [phone]  I have collaborated with the care management provider regarding care management and care coordination activities outlined in this encounter and have reviewed this encounter including documentation in the note and care plan. I am certifying that I agree with the content of this note and encounter as supervising physician.

## 2020-06-26 ENCOUNTER — Other Ambulatory Visit: Payer: Self-pay | Admitting: Family Medicine

## 2020-07-02 ENCOUNTER — Ambulatory Visit: Payer: Self-pay | Admitting: Pharmacist

## 2020-07-02 NOTE — Chronic Care Management (AMB) (Addendum)
  Chronic Care Management   Follow Up Note   07/02/2020 Name: [name] MRN: [id number] DOB: Apr 21, 1951  Referred by: [name], MD Reason for referral : No chief complaint on file.   [name] is a 69 y.o. year old male who is a primary care patient of Pickard, Cammie Mcgee, MD. The CCM team was consulted for assistance with chronic disease management and care coordination needs.    Review of patient status, including review of consultants reports, relevant laboratory and other test results, and collaboration with appropriate care team members and the patient's provider was performed as part of comprehensive patient evaluation and provision of chronic care management services.    SDOH (Social Determinants of Health) assessments performed: No See Care Plan activities for detailed interventions related to [facility])      Outpatient Encounter Medications as of 07/02/2020  Medication Sig   albuterol (VENTOLIN HFA) 108 (90 Base) MCG/ACT inhaler Inhale 2 puffs into the lungs every 4 (four) hours as needed for wheezing or shortness of breath.   atorvastatin (LIPITOR) 40 MG tablet Take 1 tablet by mouth once daily   Calcium Carbonate-Vitamin D (CALCIUM + D PO) Take 1 tablet by mouth at bedtime.    fentaNYL (DURAGESIC - DOSED MCG/HR) 25 MCG/HR Place 25 mcg onto the skin every 3 (three) days.    fluticasone (FLONASE) 50 MCG/ACT nasal spray Place 1 spray into both nostrils at bedtime as needed for allergies or rhinitis.    magnesium oxide (MAG-OX) 400 MG tablet Take 400 mg by mouth at bedtime.    meclizine (ANTIVERT) 25 MG tablet Take 1 tablet (25 mg total) by mouth 3 (three) times daily as needed for dizziness.   Multiple Vitamin (MULTIVITAMIN WITH MINERALS) TABS tablet Take 1 tablet by mouth at bedtime.    Omega-3 Fatty Acids (FISH OIL) 500 MG CAPS Take 500 mg by mouth at bedtime.    PRESCRIPTION MEDICATION Inhale into the lungs at bedtime. CPAP   rivaroxaban (XARELTO) 20 MG TABS  tablet TAKE 1 TABLET BY MOUTH ONCE DAILY WITH SUPPER   traZODone (DESYREL) 50 MG tablet Take 100 mg by mouth at bedtime.    vortioxetine HBr (TRINTELLIX) 10 MG TABS tablet Take 1 tablet (10 mg total) by mouth at bedtime.   No facility-administered encounter medications on file as of 07/02/2020.     Objective:  Chart review to analyze care gaps for medicare. Goals Addressed   None     There are no care plans to display for this patient. Care Gaps:  Met -  BP < 140/90 Non tobacco user Negative depression screen  No met -  No AWV performed  Plan:   The patient has been provided with contact information for the care management team and has been advised to call with any health related questions or concerns.    Beverly Milch, PharmD Clinical Pharmacist [place] [phone]  I have collaborated with the care management provider regarding care management and care coordination activities outlined in this encounter and have reviewed this encounter including documentation in the note and care plan. I am certifying that I agree with the content of this note and encounter as supervising physician.

## 2020-07-11 ENCOUNTER — Other Ambulatory Visit: Payer: Medicare Other

## 2020-07-11 ENCOUNTER — Other Ambulatory Visit: Payer: Self-pay

## 2020-07-11 DIAGNOSIS — E785 Hyperlipidemia, unspecified: Secondary | ICD-10-CM

## 2020-07-11 DIAGNOSIS — Z125 Encounter for screening for malignant neoplasm of prostate: Secondary | ICD-10-CM

## 2020-07-11 DIAGNOSIS — I10 Essential (primary) hypertension: Secondary | ICD-10-CM

## 2020-07-11 DIAGNOSIS — I480 Paroxysmal atrial fibrillation: Secondary | ICD-10-CM | POA: Diagnosis not present

## 2020-07-11 DIAGNOSIS — E7800 Pure hypercholesterolemia, unspecified: Secondary | ICD-10-CM

## 2020-07-15 DIAGNOSIS — Z79899 Other long term (current) drug therapy: Secondary | ICD-10-CM | POA: Diagnosis not present

## 2020-07-16 ENCOUNTER — Other Ambulatory Visit: Payer: Self-pay

## 2020-07-16 ENCOUNTER — Ambulatory Visit (INDEPENDENT_AMBULATORY_CARE_PROVIDER_SITE_OTHER): Payer: Medicare Other | Admitting: Family Medicine

## 2020-07-16 VITALS — BP 110/60 | HR 75 | Temp 98.4°F | Ht 67.0 in | Wt 235.0 lb

## 2020-07-16 DIAGNOSIS — I2510 Atherosclerotic heart disease of native coronary artery without angina pectoris: Secondary | ICD-10-CM

## 2020-07-16 DIAGNOSIS — I480 Paroxysmal atrial fibrillation: Secondary | ICD-10-CM | POA: Diagnosis not present

## 2020-07-16 DIAGNOSIS — E7800 Pure hypercholesterolemia, unspecified: Secondary | ICD-10-CM

## 2020-07-16 DIAGNOSIS — Z0001 Encounter for general adult medical examination with abnormal findings: Secondary | ICD-10-CM

## 2020-07-16 DIAGNOSIS — I10 Essential (primary) hypertension: Secondary | ICD-10-CM

## 2020-07-16 DIAGNOSIS — Z0000 Encounter for general adult medical examination without abnormal findings: Secondary | ICD-10-CM

## 2020-07-16 LAB — LIPID PANEL
Cholesterol: 165 mg/dL (ref <200)
HDL: 52 mg/dL (ref >=40)
LDL Cholesterol (Calc): 97 mg/dL (calc)
Non-HDL Cholesterol (Calc): 113 mg/dL (calc) (ref <130)
Total CHOL/HDL Ratio: 3.2 (calc) (ref <5.0)
Triglycerides: 74 mg/dL (ref <150)

## 2020-07-16 LAB — TSH: TSH: 3.9 mIU/L (ref 0.40–4.50)

## 2020-07-16 LAB — COMPLETE METABOLIC PANEL WITH GFR
AG Ratio: 1.7 (calc) (ref 1.0–2.5)
ALT: 28 U/L (ref 9–46)
AST: 23 U/L (ref 10–35)
Albumin: 4.4 g/dL (ref 3.6–5.1)
Alkaline phosphatase (APISO): 67 U/L (ref 35–144)
BUN: 16 mg/dL (ref 7–25)
CO2: 27 mmol/L (ref 20–32)
Calcium: 9.9 mg/dL (ref 8.6–10.3)
Chloride: 106 mmol/L (ref 98–110)
Creat: 1.12 mg/dL (ref 0.70–1.18)
GFR, Est African American: 76 mL/min/{1.73_m2} (ref >=60)
GFR, Est Non African American: 66 mL/min/{1.73_m2} (ref >=60)
Globulin: 2.6 g/dL (calc) (ref 1.9–3.7)
Glucose, Bld: 111 mg/dL — ABNORMAL HIGH (ref 65–99)
Potassium: 4.4 mmol/L (ref 3.5–5.3)
Sodium: 142 mmol/L (ref 135–146)
Total Bilirubin: 0.8 mg/dL (ref 0.2–1.2)
Total Protein: 7 g/dL (ref 6.1–8.1)

## 2020-07-16 LAB — CBC WITH DIFFERENTIAL/PLATELET
Absolute Monocytes: 625 cells/uL (ref 200–950)
Basophils Absolute: 58 cells/uL (ref 0–200)
Basophils Relative: 1.1 %
Eosinophils Absolute: 170 cells/uL (ref 15–500)
Eosinophils Relative: 3.2 %
HCT: 45.7 % (ref 38.5–50.0)
Hemoglobin: 15.8 g/dL (ref 13.2–17.1)
Lymphs Abs: 1516 cells/uL (ref 850–3900)
MCH: 32.8 pg (ref 27.0–33.0)
MCHC: 34.6 g/dL (ref 32.0–36.0)
MCV: 95 fL (ref 80.0–100.0)
MPV: 10.2 fL (ref 7.5–12.5)
Monocytes Relative: 11.8 %
Neutro Abs: 2931 cells/uL (ref 1500–7800)
Neutrophils Relative %: 55.3 %
Platelets: 169 10*3/uL (ref 140–400)
RBC: 4.81 10*6/uL (ref 4.20–5.80)
RDW: 12.2 % (ref 11.0–15.0)
Total Lymphocyte: 28.6 %
WBC: 5.3 10*3/uL (ref 3.8–10.8)

## 2020-07-16 LAB — TEST AUTHORIZATION

## 2020-07-16 LAB — TESTOSTERONE, FREE & TOTAL
Free Testosterone: 61.7 pg/mL (ref 30.0–135.0)
Testosterone, Total, LC-MS-MS: 513 ng/dL (ref 250–1100)

## 2020-07-16 LAB — PSA: PSA: 1.39 ng/mL (ref <=4.0)

## 2020-07-16 NOTE — Progress Notes (Signed)
Subjective:    Patient ID: [name], male    DOB: 1951/04/17, 69 y.o.   MRN: [id number]  HPI Patient is a very pleasant 69 year old Caucasian male here today for complete physical exam.  He denies any concerns.  Past medical history is significant for coronary artery disease, paroxysmal atrial fibrillation, hypertension and depression.  He also has chronic pain managed with a fentanyl pain patch through a pain clinic.  He denies any depression.  He states that he has not had any suicidal ideation or anhedonia.  He denies any memory loss.  He denies any falls.  He denies any chest pain shortness of breath or dyspnea on exertion.  Today he is in normal sinus rhythm.  He denies any palpitations or syncope or shortness of breath.  He is appropriately anticoagulated with Xarelto.  His last colonoscopy was in 2017 and is not due again until 2027.  He is due for a PSA.  His PSA was last checked 1 year ago and was 1.7.  His immunizations are up-to-date as listed below Immunization History  Administered Date(s) Administered  . Fluad Quad(high Dose 65+) 04/06/2019, 05/01/2020  . Influenza Split 07/04/2012, 03/03/2013  . Influenza,inj,Quad PF,6+ Mos 05/03/2014, 04/30/2015, 04/03/2016, 04/08/2017, 04/11/2018  . Influenza,inj,quad, With Preservative 05/12/2017  . Pneumococcal Conjugate-13 05/03/2014  . Pneumococcal Polysaccharide-23 08/03/2008, 04/03/2016  . Tdap 08/03/2010  . Zoster 08/03/2010    Immunization History  Administered Date(s) Administered  . Fluad Quad(high Dose 65+) 04/06/2019, 05/01/2020  . Influenza Split 07/04/2012, 03/03/2013  . Influenza,inj,Quad PF,6+ Mos 05/03/2014, 04/30/2015, 04/03/2016, 04/08/2017, 04/11/2018  . Influenza,inj,quad, With Preservative 05/12/2017  . Pneumococcal Conjugate-13 05/03/2014  . Pneumococcal Polysaccharide-23 08/03/2008, 04/03/2016  . Tdap 08/03/2010  . Zoster 08/03/2010    Past Medical History:  Diagnosis Date  . Arthritis    oa and ddd   . Back pain, chronic    pt states he has 3 herniated disks--uses fentanyl patch for pain  . Blood transfusion without reported diagnosis 1950   rH negative"at birth only"  . BPH (benign prostatic hyperplasia)    frequent urination, not able to hold urine well  . 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 Cataracts, both eyes    worst on left  . Colon polyps   . Complication of anesthesia    pt had spinal for a knee replacement--"woke up" during surgery--and sick after surgery  . Depression   . Diverticulosis   . DVT (deep venous thrombosis) ([place]) 2001 or 2002   right leg-required hospitalization x 8 days  . DVT (deep venous thrombosis) ([place]) 1996   right  . Glaucoma    both eyes  . H/O hiatal hernia   . Hearing impaired person, bilateral    bilateral  . Hemorrhoid   . History of IBS   . History of kidney stones    x 1-passed  . Hyperlipidemia 2012  . Hypertension   . Lumbago   . Lumbar post-laminectomy syndrome   . PONV (postoperative nausea and vomiting)   . Sleep apnea    pt uses cpap - setting of 16  . Ulcer 1966  . Ulcerative colitis ([place])   . Wears glasses   . Wears hearing aid    Past Surgical History:  Procedure Laterality Date  . APPENDECTOMY  1993  . BACK SURGERY  2008    bone spur removed   . bilateral carpal tunnel release 2009    . cervical fusion 2011--pt has  good range of motion neck    . CHOLECYSTECTOMY  1994  . EYE SURGERY N/A    Phreesia 07/13/2020  . INSERTION OF MESH Bilateral 07/31/2016   Procedure: INSERTION OF MESH;  Surgeon: [name], MD;  Location: [facility];  Service: General;  Laterality: Bilateral;  . JOINT REPLACEMENT Bilateral    2001 left total knee and 1985 right total knee  . MASS EXCISION Left 01/04/2014   Procedure: LEFT MIDDLE FINGER MASS EXCISION WITH NAILBED REPAIR AND ADVANCEMENT FLAP;  Surgeon: [name], MD;  Location: [place];  Service: Orthopedics;  Laterality: Left;  . mass removed  from 2nd finger Left aug 2014   squamous carcinoma  . nissen fundoplication [id number]    . PROSTATE SURGERY  2014   TURP  . right wrist fusion 12/12 - pt wearing brace on the wrist-not started physical therapy yet Right    fusion prevent limited ROM to right wrist  . SPINE SURGERY  2009   cervical spine fusion  . thyroid nodule     removed  . TOTAL KNEE REVISION Right 11/17/2013   Procedure: RIGHT KNEE POLYETHYLENE REVISION, bone graft;  Surgeon: [name], MD;  Location: [facility];  Service: Orthopedics;  Laterality: Right;  . TOTAL SHOULDER ARTHROPLASTY Right 01/28/2018   Procedure: RIGHT TOTAL SHOULDER ARTHROPLASTY;  Surgeon: [name], MD;  Location: [place];  Service: Orthopedics;  Laterality: Right;  . TOTAL SHOULDER ARTHROPLASTY Left 07/15/2018   Procedure: LEFT SHOULDER ANATOMIC TOTAL SHOULDER ARTHROPLASTY;  Surgeon: [name], MD;  Location: [place];  Service: Orthopedics;  Laterality: Left;  . TRANSURETHRAL RESECTION OF PROSTATE  10/09/2011   Procedure: TRANSURETHRAL RESECTION OF THE PROSTATE WITH GYRUS INSTRUMENTS;  Surgeon: Fredricka Bonine, MD;  Location: [facility];  Service: Urology;  Laterality: N/A;  . uvvvp    . XI ROBOTIC ASSISTED INGUINAL HERNIA REPAIR WITH MESH Bilateral 07/31/2016   Procedure: XI ROBOTIC BILATERAL INGUINAL HERNIA REPAIR WITH MESH;  Surgeon: [name], MD;  Location: [facility];  Service: General;  Laterality: Bilateral;   Current Outpatient Medications on File Prior to Visit  Medication Sig Dispense Refill  . albuterol (VENTOLIN HFA) 108 (90 Base) MCG/ACT inhaler Inhale 2 puffs into the lungs every 4 (four) hours as needed for wheezing or shortness of breath. 18 g 2  . atorvastatin (LIPITOR) 40 MG tablet Take 1 tablet by mouth once daily 30 tablet 0  . Calcium Carbonate-Vitamin D (CALCIUM + D PO) Take 1 tablet by mouth at bedtime.     . fentaNYL (DURAGESIC - DOSED MCG/HR) 25 MCG/HR Place 25 mcg onto the skin every 3 (three) days.     . fluticasone  (FLONASE) 50 MCG/ACT nasal spray Place 1 spray into both nostrils at bedtime as needed for allergies or rhinitis.     . magnesium oxide (MAG-OX) 400 MG tablet Take 400 mg by mouth at bedtime.     . meclizine (ANTIVERT) 25 MG tablet Take 1 tablet (25 mg total) by mouth 3 (three) times daily as needed for dizziness. 30 tablet 0  . Multiple Vitamin (MULTIVITAMIN WITH MINERALS) TABS tablet Take 1 tablet by mouth at bedtime.     . Omega-3 Fatty Acids (FISH OIL) 500 MG CAPS Take 500 mg by mouth at bedtime.     Marland Kitchen PRESCRIPTION MEDICATION Inhale into the lungs at bedtime. CPAP    . rivaroxaban (XARELTO) 20 MG TABS tablet TAKE 1 TABLET BY MOUTH ONCE DAILY WITH SUPPER 30 tablet 11  .  traZODone (DESYREL) 50 MG tablet Take 100 mg by mouth at bedtime.    . vortioxetine HBr (TRINTELLIX) 10 MG TABS tablet Take 1 tablet (10 mg total) by mouth at bedtime. 30 tablet 5   No current facility-administered medications on file prior to visit.   Allergies  Allergen Reactions  . Adhesive [Tape] Other (See Comments)    Causes blisters - pls use paper tape   Social History   Socioeconomic History  . Marital status: Married    Spouse name: Not on file  . Number of children: Not on file  . Years of education: Not on file  . Highest education level: Not on file  Occupational History  . Occupation: disabled  Tobacco Use  . Smoking status: Former Smoker    Packs/day: 1.00    Years: 19.00    Pack years: 19.00    Types: Cigarettes    Quit date: 08/04/1983    Years since quitting: 36.9  . Smokeless tobacco: Never Used  . Tobacco comment: quit smoking 1985  Vaping Use  . Vaping Use: Never used  Substance and Sexual Activity  . Alcohol use: Yes    Alcohol/week: 6.0 standard drinks    Types: 6 Cans of beer per week    Comment: maybe 2 or 3 beers per week  . Drug use: No  . Sexual activity: Not Currently  Other Topics Concern  . Not on file  Social History Narrative  . Not on file   Social Determinants of  Health   Financial Resource Strain: Low Risk   . Difficulty of Paying Living Expenses: Not very hard  Food Insecurity: Not on file  Transportation Needs: Not on file  Physical Activity: Not on file  Stress: Not on file  Social Connections: Not on file  Intimate Partner Violence: Not on file   Family History  Problem Relation Age of Onset  . Colon cancer Father   . Bone cancer Father        deceased  . Cancer Father   . Diabetes Father   . Prostate cancer Father   . Arthritis Mother   . Hearing loss Mother   . Hyperlipidemia Mother   . Hypertension Mother   . Deep vein thrombosis Mother   . Diabetes Sister   . Hearing loss Maternal Grandmother   . Diabetes Paternal Grandfather      Review of Systems  All other systems reviewed and are negative.      Objective:   Physical Exam Vitals reviewed.  Constitutional:      General: He is not in acute distress.    Appearance: He is well-developed. He is not diaphoretic.  HENT:     Head: Normocephalic and atraumatic.     Right Ear: External ear normal.     Left Ear: External ear normal.     Nose: Nose normal.     Mouth/Throat:     Pharynx: No oropharyngeal exudate.  Eyes:     General: No scleral icterus.       Right eye: No discharge.        Left eye: No discharge.     Conjunctiva/sclera: Conjunctivae normal.     Pupils: Pupils are equal, round, and reactive to light.  Neck:     Thyroid: No thyromegaly.     Vascular: No JVD.     Trachea: No tracheal deviation.  Cardiovascular:     Rate and Rhythm: Normal rate and regular rhythm.     Heart  sounds: Normal heart sounds. No murmur heard. No friction rub. No gallop.   Pulmonary:     Effort: Pulmonary effort is normal. No respiratory distress.     Breath sounds: Normal breath sounds. No stridor. No wheezing or rales.  Chest:     Chest wall: No tenderness.  Abdominal:     General: Bowel sounds are normal. There is no distension.     Palpations: Abdomen is soft. There  is no mass.     Tenderness: There is no abdominal tenderness. There is no guarding or rebound.  Musculoskeletal:        General: No tenderness or deformity. Normal range of motion.     Cervical back: Normal range of motion and neck supple.  Lymphadenopathy:     Cervical: No cervical adenopathy.  Skin:    General: Skin is warm.     Coloration: Skin is not pale.     Findings: No erythema or rash.  Neurological:     Mental Status: He is alert and oriented to person, place, and time.     Cranial Nerves: No cranial nerve deficit.     Motor: No abnormal muscle tone.     Coordination: Coordination normal.     Deep Tendon Reflexes: Reflexes are normal and symmetric.  Psychiatric:        Behavior: Behavior normal.        Thought Content: Thought content normal.        Judgment: Judgment normal.           Assessment & Plan:  Routine general medical examination at a health care facility  Benign essential HTN  Pure hypercholesterolemia  Nonocclusive coronary atherosclerosis of native coronary artery  Paroxysmal atrial fibrillation ([place])  Lab on 07/11/2020  Component Date Value Ref Range Status  . Cholesterol 07/11/2020 165  <200 mg/dL Final  . HDL 07/11/2020 52  > OR = 40 mg/dL Final  . Triglycerides 07/11/2020 74  <150 mg/dL Final  . LDL Cholesterol (Calc) 07/11/2020 97  mg/dL (calc) Final   Comment: Reference range: <100 . Desirable range <100 mg/dL for primary prevention;   <70 mg/dL for patients with CHD or diabetic patients  with > or = 2 CHD risk factors. Marland Kitchen LDL-C is now calculated using the Martin-Hopkins  calculation, which is a validated novel method providing  better accuracy than the Friedewald equation in the  estimation of LDL-C.  Cresenciano Genre et al. Annamaria Helling. 3557;322(02): 2061-2068  (http://education.[facility].com/faq/FAQ164)   . Total CHOL/HDL Ratio 07/11/2020 3.2  <5.0 (calc) Final  . Non-HDL Cholesterol (Calc) 07/11/2020 113  <130 mg/dL (calc) Final    Comment: For patients with diabetes plus 1 major ASCVD risk  factor, treating to a non-HDL-C goal of <100 mg/dL  (LDL-C of <70 mg/dL) is considered a therapeutic  option.   . WBC 07/11/2020 5.3  3.8 - 10.8 Thousand/uL Final  . RBC 07/11/2020 4.81  4.20 - 5.80 Million/uL Final  . Hemoglobin 07/11/2020 15.8  13.2 - 17.1 g/dL Final  . HCT 07/11/2020 45.7  38.5 - 50.0 % Final  . MCV 07/11/2020 95.0  80.0 - 100.0 fL Final  . MCH 07/11/2020 32.8  27.0 - 33.0 pg Final  . MCHC 07/11/2020 34.6  32.0 - 36.0 g/dL Final  . RDW 07/11/2020 12.2  11.0 - 15.0 % Final  . Platelets 07/11/2020 169  140 - 400 Thousand/uL Final  . MPV 07/11/2020 10.2  7.5 - 12.5 fL Final  . Neutro Abs 07/11/2020 2,931  1,500 -  7,800 cells/uL Final  . Lymphs Abs 07/11/2020 1,516  850 - 3,900 cells/uL Final  . Absolute Monocytes 07/11/2020 625  200 - 950 cells/uL Final  . Eosinophils Absolute 07/11/2020 170  15 - 500 cells/uL Final  . Basophils Absolute 07/11/2020 58  0 - 200 cells/uL Final  . Neutrophils Relative % 07/11/2020 55.3  % Final  . Total Lymphocyte 07/11/2020 28.6  % Final  . Monocytes Relative 07/11/2020 11.8  % Final  . Eosinophils Relative 07/11/2020 3.2  % Final  . Basophils Relative 07/11/2020 1.1  % Final  . Glucose, Bld 07/11/2020 111* 65 - 99 mg/dL Final   Comment: .            Fasting reference interval . For someone without known diabetes, a glucose value between 100 and 125 mg/dL is consistent with prediabetes and should be confirmed with a follow-up test. .   . BUN 07/11/2020 16  7 - 25 mg/dL Final  . Creat 07/11/2020 1.12  0.70 - 1.18 mg/dL Final   Comment: For patients >58 years of age, the reference limit for Creatinine is approximately 13% higher for people identified as African-American. .   . GFR, Est Non African American 07/11/2020 66  > OR = 60 mL/min/1.15m Final  . GFR, Est African American 07/11/2020 76  > OR = 60 mL/min/1.717mFinal  . BUN/Creatinine Ratio 1271/06/2694OT  APPLICABLE  6 - 22 (calc) Final  . Sodium 07/11/2020 142  135 - 146 mmol/L Final  . Potassium 07/11/2020 4.4  3.5 - 5.3 mmol/L Final  . Chloride 07/11/2020 106  98 - 110 mmol/L Final  . CO2 07/11/2020 27  20 - 32 mmol/L Final  . Calcium 07/11/2020 9.9  8.6 - 10.3 mg/dL Final  . Total Protein 07/11/2020 7.0  6.1 - 8.1 g/dL Final  . Albumin 07/11/2020 4.4  3.6 - 5.1 g/dL Final  . Globulin 07/11/2020 2.6  1.9 - 3.7 g/dL (calc) Final  . AG Ratio 07/11/2020 1.7  1.0 - 2.5 (calc) Final  . Total Bilirubin 07/11/2020 0.8  0.2 - 1.2 mg/dL Final  . Alkaline phosphatase (APISO) 07/11/2020 67  35 - 144 U/L Final  . AST 07/11/2020 23  10 - 35 U/L Final  . ALT 07/11/2020 28  9 - 46 U/L Final  . TSH 07/11/2020 3.90  0.40 - 4.50 mIU/L Final  . Testosterone, Total, LC-MS-MS 07/11/2020 513  250 - 1,100 ng/dL Final   Comment: . For additional information, please refer to https://education.[facility].com/faq/FAQ165 (This link is being provided for informational/educational purposes only.) (Note) . This test was developed and its analytical performance characteristics have been determined by [facility]. It has not been cleared or approved by the FDA. This assay has been validated pursuant to the CLIA regulations and is used for clinical purposes. . .   . Free Testosterone 07/11/2020 61.7  30.0 - 135.0 pg/mL Final   Comment: (Note) This test was developed and its analytical performance  characteristics have been determined by [facility]. It has not been  cleared or approved by the FDA. This assay has been validated  pursuant to the CLIA regulations and is used for clinical purposes. . [facility] [id number] [place] [phone] MiWillette BraceMD    I will add a PSA to his lab work to screen for prostate cancer.  Colon cancer is up-to-date with a colonoscopy in 2017.  Immunizations are up-to-date.  He denies any falls, depression, or memory loss.  His depression is well managed on the Trintellix.  He is appropriately anticoagulated with Xarelto.  My biggest concern is his elevated blood sugar.  He is borderline prediabetic.  His BMI is 36.  I recommended 30 minutes a day 5 days a week of aerobic exercise and to try to lose 15 to 20 pounds of weight gradually over the next 6 months.  Also recommended a low carbohydrate diet.  The remainder of his preventative care is up-to-date

## 2020-08-21 ENCOUNTER — Other Ambulatory Visit: Payer: Self-pay | Admitting: Family Medicine

## 2020-09-04 ENCOUNTER — Telehealth: Payer: Self-pay | Admitting: Pharmacist

## 2020-09-04 DIAGNOSIS — H401131 Primary open-angle glaucoma, bilateral, mild stage: Secondary | ICD-10-CM | POA: Diagnosis not present

## 2020-09-04 NOTE — Progress Notes (Addendum)
Chronic Care Management Pharmacy Assistant   Name: [name]  MRN: [id number] DOB: 12/28/1950  Reason for Encounter: Disease State For HTN and HLD  Patient Questions:  1.  Have you seen any other providers since your last visit? Yes.   2.  Any changes in your medicines or health? No.   PCP : [name], MD   Their chronic conditions include: hypertension, GERD, hyperlipidemia, and afib.  Office Visits: 07/16/20 Dr. Dennard Schaumann Routine general exam. Doctor concerned about his elevated blood sugar/prediabetic, wants the patent to loose 15-20 pounds. No medication changes. Consults: 07/15/20 Chiropractic Medicine [facility]. No information given.  Allergies:   Allergies  Allergen Reactions   Adhesive [Tape] Other (See Comments)    Causes blisters - pls use paper tape    Medications: Outpatient Encounter Medications as of 09/04/2020  Medication Sig   albuterol (VENTOLIN HFA) 108 (90 Base) MCG/ACT inhaler Inhale 2 puffs into the lungs every 4 (four) hours as needed for wheezing or shortness of breath.   atorvastatin (LIPITOR) 40 MG tablet Take 1 tablet by mouth once daily   Calcium Carbonate-Vitamin D (CALCIUM + D PO) Take 1 tablet by mouth at bedtime.    fentaNYL (DURAGESIC - DOSED MCG/HR) 25 MCG/HR Place 25 mcg onto the skin every 3 (three) days.    fluticasone (FLONASE) 50 MCG/ACT nasal spray Place 1 spray into both nostrils at bedtime as needed for allergies or rhinitis.    magnesium oxide (MAG-OX) 400 MG tablet Take 400 mg by mouth at bedtime.    meclizine (ANTIVERT) 25 MG tablet Take 1 tablet (25 mg total) by mouth 3 (three) times daily as needed for dizziness.   Multiple Vitamin (MULTIVITAMIN WITH MINERALS) TABS tablet Take 1 tablet by mouth at bedtime.    Omega-3 Fatty Acids (FISH OIL) 500 MG CAPS Take 500 mg by mouth at bedtime.    PRESCRIPTION MEDICATION Inhale into the lungs at bedtime. CPAP   rivaroxaban (XARELTO) 20 MG TABS tablet TAKE 1 TABLET BY MOUTH  ONCE DAILY WITH SUPPER   traZODone (DESYREL) 50 MG tablet Take 100 mg by mouth at bedtime.   vortioxetine HBr (TRINTELLIX) 10 MG TABS tablet Take 1 tablet (10 mg total) by mouth at bedtime.   No facility-administered encounter medications on file as of 09/04/2020.    Current Diagnosis: Patient Active Problem List   Diagnosis Date Noted   Nonocclusive coronary atherosclerosis of native coronary artery 10/08/2019   Former smoker 09/26/2019   Cough 09/28/2018   Nasal congestion 09/28/2018   Paroxysmal atrial fibrillation ([place]) 08/25/2018   Cellulitis 07/18/2018   Essential hypertension 07/18/2018   Hyperlipidemia 07/18/2018   Allergic reaction 07/18/2018   Atrial fibrillation with RVR ([place]) 07/18/2018   H/O total shoulder replacement, left 07/15/2018   S/P shoulder replacement, right 01/28/2018   Robotic XI TAPP  bilateral inguinal hernia repair Dec 2017 07/31/2016   S/P bilateral inguinal herniorrhaphy 07/31/2016   Lumbar post-laminectomy syndrome    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 Spoke with patient regarding BP  Recent Office Vitals: BP Readings from Last 3 Encounters:  07/16/20 110/60  05/06/20 (!) 120/58  04/12/20 122/66   Pulse Readings from Last 3 Encounters:  07/16/20 75  05/06/20 66  04/12/20 67    Wt Readings from Last 3 Encounters:  07/16/20 235 lb (106.6 kg)  05/06/20 234 lb (106.1 kg)  04/12/20 232 lb 6.4 oz (105.4 kg)     Kidney Function Lab Results  Component Value Date/Time   CREATININE 1.12 07/11/2020 08:24 AM   CREATININE 0.96 05/25/2019 08:54 AM   GFR 80.20 01/01/2016 10:04 AM   GFRNONAA 66 07/11/2020 08:24 AM   GFRAA 76 07/11/2020 08:24 AM    BMP Latest Ref Rng & Units 07/11/2020  05/25/2019 05/18/2019  Glucose 65 - 99 mg/dL 111(H) 109(H) 95  BUN 7 - 25 mg/dL 16 14 17   Creatinine 0.70 - 1.18 mg/dL 1.12 0.96 0.98  BUN/Creat Ratio 6 - 22 (calc) NOT APPLICABLE NOT APPLICABLE 17  Sodium 427 - 146 mmol/L 142 141 139  Potassium 3.5 - 5.3 mmol/L 4.4 4.6 4.0  Chloride 98 - 110 mmol/L 106 103 102  CO2 20 - 32 mmol/L 27 27 25   Calcium 8.6 - 10.3 mg/dL 9.9 9.6 9.6    Current antihypertensive regimen:  None.  How often are you checking your Blood Pressure? Patient stated he checks his blood pressure from his watch daily .  Current home BP readings: Patient stated he doesn't write them down but his blood pressure does runs low.   What recent interventions/DTPs have been made by any provider to improve Blood Pressure control since last CPP Visit: None.  Any recent hospitalizations or ED visits since last visit with CPP? No.  What diet changes have been made to improve Blood Pressure Control?  Patient stated he eats small amounts of food, but does eat breakfast,lunch, and dinner. He stated he makes his meats in a air fryer (Chicken and pork chops).He stated he eats some vegetables but he is not a vegetable eater. Patient stated he drinks a lot unsweetened tea and diet cherry wine.   What exercise is being done to improve your Blood Pressure Control?  Patent stated he doesn't do any regular exercising but he does work outside, cutting down trees and yard work. He stated he does work inside the house sometimes as well like vacuuming and laundry.    Adherence Review: Is the patient currently on ACE/ARB medication? No.  Does the patient have >5 day gap between last estimated fill dates? No, CPP Please Check.   09/04/2020 Name: [name] MRN: [id number] DOB: 10-22-1950 [name] is a 70 y.o. year old male who is a primary care patient of [name], MD.  Comprehensive medication review performed; Spoke to patient regarding cholesterol  Lipid Panel     Component Value Date/Time   CHOL 165 07/11/2020 0824   TRIG 74 07/11/2020 0824   HDL 52 07/11/2020 0824   LDLCALC 97 07/11/2020 0824    10-year ASCVD risk score: The 10-year ASCVD risk score Mikey Bussing DC Brooke Bonito., et al., 2013) is: 13.6%   Values used to calculate the score:     Age: 30 years     Sex: Male     Is Non-Hispanic African American: No     Diabetic: No     Tobacco smoker: No     Systolic Blood Pressure: 151 mmHg     Is BP treated: No     HDL Cholesterol: 52 mg/dL     Total Cholesterol: 165 mg/dL  Current antihyperlipidemic regimen:  Atorvastatin 40 mg  Previous antihyperlipidemic medications tried: None.  ASCVD risk enhancing conditions: age >21 and  HTN   What recent interventions/DTPs have been made by any provider to improve Cholesterol control since last CPP Visit: None.  Any recent hospitalizations or ED visits since last visit with CPP? No.   What diet changes have been made to improve Cholesterol?  Patient stated he eats small amounts of food, but does eat breakfast,lunch, and dinner. He stated he makes his meats in a air fryer (Chicken and pork chops).He stated he eats some vegetables but he is not a vegetable eater. Patient stated he drinks a lot unsweetened tea and diet cherry wine.   What exercise is being done to improve Cholesterol?  Patient stated he doesn't do any regular exercising but he does work outside, cutting down trees and yard work. He stated he does work inside the house sometimes as well like vacuuming and laundry.    Adherence Review: Does the patient have >5 day gap between last estimated fill dates? No  Follow-Up:  Pharmacist Review   Charlann Lange, RMA Clinical Pharmacist Assistant [phone]  10 minutes spent in review, coordination, and documentation.  Reviewed by: Beverly Milch, PharmD Clinical Pharmacist [facility] [phone]

## 2020-09-30 ENCOUNTER — Telehealth: Payer: Self-pay | Admitting: Pharmacist

## 2020-09-30 NOTE — Progress Notes (Signed)
    Chronic Care Management Pharmacy Assistant   Name: [name]  MRN: [id number] DOB: Dec 01, 1950  Reason for Encounter: Adherence Review  PCP : [name], MD   Verified Adherence Gap Information. Per insurance data patient has not met their wellness bundle or their annual wellness visit screening. The patients screening for colorectal is current. Their most recent A1C was 5.1 on 07/19/18 and their most recent blood pressure was 120/58 on 05/06/20. The patient met their goal with keeping their blood pressure below 140/90.The patients total gaps-all measures is equal to 2.   Follow-Up:  Pharmacist Review   Charlann Lange, [place] Pharmacist Assistant [phone]

## 2020-10-28 ENCOUNTER — Telehealth (INDEPENDENT_AMBULATORY_CARE_PROVIDER_SITE_OTHER): Payer: Medicare Other | Admitting: Nurse Practitioner

## 2020-10-28 ENCOUNTER — Other Ambulatory Visit: Payer: Self-pay

## 2020-10-28 DIAGNOSIS — J301 Allergic rhinitis due to pollen: Secondary | ICD-10-CM

## 2020-10-28 DIAGNOSIS — J069 Acute upper respiratory infection, unspecified: Secondary | ICD-10-CM | POA: Diagnosis not present

## 2020-10-28 MED ORDER — SALINE SPRAY 0.65 % NA SOLN: 1.0000 | NASAL | 0 refills | Status: AC | PRN

## 2020-10-28 MED ORDER — GUAIFENESIN ER 600 MG PO TB12: 600.0000 mg | ORAL_TABLET | Freq: Two times a day (BID) | ORAL | 0 refills | Status: DC | PRN

## 2020-10-28 MED ORDER — PREDNISONE 20 MG PO TABS: 40.0000 mg | ORAL_TABLET | Freq: Every day | ORAL | 0 refills | Status: DC

## 2020-10-28 NOTE — Patient Instructions (Signed)
Viral Illness, Adult Viruses are tiny germs that can get into a person's body and cause illness. There are many different types of viruses, and they cause many types of illness. Viral illnesses can range from mild to severe. They can affect various parts of the body. Short-term conditions that are caused by a virus include colds and the flu (influenza). Long-term conditions that are caused by a virus include herpes, shingles, and HIV (human immunodeficiency virus) infection. A few viruses have been linked to certain cancers. What are the causes? Many types of viruses can cause illness. Viruses invade cells in your body, multiply, and cause the infected cells to work abnormally or die. When these cells die, they release more of the virus. When this happens, you develop symptoms of the illness, and the virus continues to spread to other cells. If the virus takes over the function of the cell, it can cause the cell to divide and grow out of control. This happens when a virus causes cancer. Different viruses get into the body in different ways. You can get a virus by:  Swallowing food or water that has come in contact with the virus (is contaminated).  Breathing in droplets that have been coughed or sneezed into the air by an infected person.  Touching a surface that has been contaminated with the virus and then touching your eyes, nose, or mouth.  Being bitten by an insect or animal that carries the virus.  Having sexual contact with a person who is infected with the virus.  Being exposed to blood or fluids that contain the virus, either through an open cut or during a transfusion. If a virus enters your body, your body's defense system (immune system) will try to fight the virus. You may be at higher risk for a viral illness if your immune system is weak. What are the signs or symptoms? You may have these symptoms, depending on the type of virus and the location of the cells that it  invades:  Cold and flu viruses: ? Fever. ? Headache. ? Sore throat. ? Muscle aches. ? Stuffy nose (nasal congestion). ? Cough.  Digestive system (gastrointestinal) viruses: ? Fever. ? Pain in the abdomen. ? Nausea. ? Diarrhea.  Liver viruses (hepatitis): ? Loss of appetite. ? Tiredness. ? Skin or the white parts of your eyes turning yellow (jaundice).  Brain and spinal cord viruses: ? Fever. ? Headache. ? Stiff neck. ? Nausea and vomiting. ? Confusion or sleepiness.  Skin viruses: ? Warts. ? Itching. ? Rash.  Sexually transmitted viruses: ? Discharge. ? Swelling. ? Redness. ? Rash. How is this diagnosed? This condition may be diagnosed based on one or more of the following:  Symptoms.  Medical history.  Physical exam.  Blood test, sample of mucus from your lungs (sputum sample), stool sample, or a swab of body fluids or a skin sore (lesion). How is this treated? Viruses can be hard to treat because they live within cells. Antibiotic medicines do not treat viruses because these medicines do not get inside cells. Treatment for a viral illness may include:  Resting and drinking plenty of fluids.  Medicines to relieve symptoms. These can include over-the-counter medicine for pain and fever, medicines for cough or congestion, and medicines to relieve diarrhea.  Antiviral medicines. These medicines are available only for certain types of viruses. Some viral illnesses can be prevented with vaccinations. A common example is the flu shot. Follow these instructions at home: Medicines  Take over-the-counter and  prescription medicines only as told by your health care provider.  If you were prescribed an antiviral medicine, take it as told by your health care provider. Do not stop taking the antiviral even if you start to feel better.  Be aware of when antibiotics are needed and when they are not needed. Antibiotics do not treat viruses. You may get an antibiotic if  your health care provider thinks that you may have, or are at risk for, a bacterial infection and you have a viral infection. ? Do not ask for an antibiotic prescription if you have been diagnosed with a viral illness. Antibiotics will not make your illness go away faster. ? Frequently taking antibiotics when they are not needed can lead to antibiotic resistance. When this develops, the medicine no longer works against the bacteria that it normally fights. General instructions  Drink enough fluids to keep your urine pale yellow.  Rest as much as possible.  Return to your normal activities as told by your health care provider. Ask your health care provider what activities are safe for you.  Keep all follow-up visits as told by your health care provider. This is important.   How is this prevented? To reduce your risk of viral illness:  Wash your hands often with soap and water for at least 20 seconds. If soap and water are not available, use hand sanitizer.  Avoid touching your nose, eyes, and mouth, especially if you have not washed your hands recently.  If anyone in your household has a viral infection, clean all household surfaces that may have been in contact with the virus. Use soap and hot water. You may also use bleach that you have added water to (diluted).  Stay away from people who are sick with symptoms of a viral infection.  Do not share items such as toothbrushes and water bottles with other people.  Keep your vaccinations up to date. This includes getting a yearly flu shot.  Eat a healthy diet and get plenty of rest.   Contact a health care provider if:  You have symptoms of a viral illness that do not go away.  Your symptoms come back after going away.  Your symptoms get worse. Get help right away if you have:  Trouble breathing.  A severe headache or a stiff neck.  Severe vomiting or pain in your abdomen. These symptoms may represent a serious problem that is  an emergency. Do not wait to see if the symptoms will go away. Get medical help right away. Call your local emergency services (911 in the [place] Do not drive yourself to the hospital. Summary  Viruses are types of germs that can get into a person's body and cause illness. Viral illnesses can range from mild to severe. They can affect various parts of the body.  Viruses can be hard to treat. There are medicines to relieve symptoms, and there are some antiviral medicines.  If you were prescribed an antiviral medicine, take it as told by your health care provider. Do not stop taking the antiviral even if you start to feel better.  Contact a health care provider if you have symptoms of a viral illness that do not go away. This information is not intended to replace advice given to you by your health care provider. Make sure you discuss any questions you have with your health care provider. Document Revised: 12/04/2019 Document Reviewed: 05/30/2019 Elsevier Patient Education  2021 [employer].

## 2020-10-28 NOTE — Progress Notes (Signed)
Subjective:    Patient ID: [name], male    DOB: 1951-04-27, 70 y.o.   MRN: [id number]  HPI: [name] is a 69 y.o. male presenting virtually for cough, fever, nasal congestion, and loss of voice.  Chief Complaint  Patient presents with  . Sinusitis    Sx began on Friday after mowing his grass. Having cough, fever 99.0, nasal congestion and loss of voice. Taking tylenol  and using vicks in nose.    UPPER RESPIRATORY TRACT INFECTION Onset: Friday 3/25 after mowing lawn COVID vaccination status: fully vaccinated with 2 shots and booster COVID testing history: yes; at-home today negative Fever: yes; low grade 99.0 Cough: yes Shortness of breath: no Wheezing: yes Chest pain: yes, with cough Chest tightness: no Chest congestion: yes Nasal congestion: yes  Hoarseness: yes Runny nose: no Post nasal drip: no Sneezing: yes Sore throat: yes Swollen glands: yes, left Sinus pressure: yes Headache: yes Face pain: no Toothache: no Ear pain: no  Ear pressure: no  Eyes red/itching:no Eye drainage/crusting: no ; none new Nausea: no  Vomiting: no Diarrhea: no  Change in appetite: no  Loss of taste/smell: no  Rash: no Fatigue: no Sick contacts: no Strep contacts: no  Context: worse Recurrent sinusitis: no Treatments attempted: Tylenol and Vicks in nose, sinus allergy pill Relief with OTC medications: yes  Allergies  Allergen Reactions  . Adhesive [Tape] Other (See Comments)    Causes blisters - pls use paper tape  . Other Other (See Comments)    "Adhesives"Pt allergic to some tapes--pt request that we only use paper tape!!    Outpatient Encounter Medications as of 10/28/2020  Medication Sig  . albuterol (VENTOLIN HFA) 108 (90 Base) MCG/ACT inhaler Inhale 2 puffs into the lungs every 4 (four) hours as needed for wheezing or shortness of breath.  Marland Kitchen atorvastatin (LIPITOR) 40 MG tablet Take 1 tablet by mouth once daily  . Calcium Carbonate-Vitamin D (CALCIUM +  D PO) Take 1 tablet by mouth at bedtime.   . fentaNYL (DURAGESIC - DOSED MCG/HR) 25 MCG/HR Place 25 mcg onto the skin every 3 (three) days.   . fluticasone (FLONASE) 50 MCG/ACT nasal spray Place 1 spray into both nostrils at bedtime as needed for allergies or rhinitis.   Marland Kitchen guaiFENesin (MUCINEX) 600 MG 12 hr tablet Take 1 tablet (600 mg total) by mouth 2 (two) times daily as needed for cough or to loosen phlegm.  . magnesium oxide (MAG-OX) 400 MG tablet Take 400 mg by mouth at bedtime.   . meclizine (ANTIVERT) 25 MG tablet Take 1 tablet (25 mg total) by mouth 3 (three) times daily as needed for dizziness.  . Multiple Vitamin (MULTIVITAMIN WITH MINERALS) TABS tablet Take 1 tablet by mouth at bedtime.   . Omega-3 Fatty Acids (FISH OIL) 500 MG CAPS Take 500 mg by mouth at bedtime.   . predniSONE (DELTASONE) 20 MG tablet Take 2 tablets (40 mg total) by mouth daily with breakfast.  . PRESCRIPTION MEDICATION Inhale into the lungs at bedtime. CPAP  . rivaroxaban (XARELTO) 20 MG TABS tablet TAKE 1 TABLET BY MOUTH ONCE DAILY WITH SUPPER  . sodium chloride (OCEAN) 0.65 % SOLN nasal spray Place 1 spray into both nostrils as needed for congestion.  . traZODone (DESYREL) 50 MG tablet Take 100 mg by mouth at bedtime.  . vortioxetine HBr (TRINTELLIX) 10 MG TABS tablet Take 1 tablet (10 mg total) by mouth at bedtime.   No facility-administered encounter medications  on file as of 10/28/2020.    Patient Active Problem List   Diagnosis Date Noted  . Nonocclusive coronary atherosclerosis of native coronary artery 10/08/2019  . Former smoker 09/26/2019  . Cough 09/28/2018  . Nasal congestion 09/28/2018  . Paroxysmal atrial fibrillation ([place]) 08/25/2018  . Cellulitis 07/18/2018  . Essential hypertension 07/18/2018  . Hyperlipidemia 07/18/2018  . Allergic reaction 07/18/2018  . Atrial fibrillation with RVR ([place]) 07/18/2018  . H/O total shoulder replacement, left 07/15/2018  . S/P shoulder replacement, right  01/28/2018  . Robotic XI TAPP  bilateral inguinal hernia repair Dec 2017 07/31/2016  . S/P bilateral inguinal herniorrhaphy 07/31/2016  . Lumbar post-laminectomy syndrome   . 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 OA (osteoarthritis) of knee 11/17/2013  . OSA (obstructive sleep apnea) 12/05/2012  . GERD 12/23/2007  . RLQ PAIN 12/23/2007  . PERSONAL HX COLONIC POLYPS 12/23/2007    Past Medical History:  Diagnosis Date  . Arthritis    oa and ddd  . Back pain, chronic    pt states he has 3 herniated disks--uses fentanyl patch for pain  . Blood transfusion without reported diagnosis 1950   rH negative"at birth only"  . BPH (benign prostatic hyperplasia)    frequent urination, not able to hold urine well  . 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 Cataracts, both eyes    worst on left  . Colon polyps   . Complication of anesthesia    pt had spinal for a knee replacement--"woke up" during surgery--and sick after surgery  . Depression   . Diverticulosis   . DVT (deep venous thrombosis) ([place]) 2001 or 2002   right leg-required hospitalization x 8 days  . DVT (deep venous thrombosis) ([place]) 1996   right  . Glaucoma    both eyes  . H/O hiatal hernia   . Hearing impaired person, bilateral    bilateral  . Hemorrhoid   . History of IBS   . History of kidney stones    x 1-passed  . Hyperlipidemia 2012  . Hypertension   . Lumbago   . Lumbar post-laminectomy syndrome   . PONV (postoperative nausea and vomiting)   . Sleep apnea    pt uses cpap - setting of 16  . Ulcer 1966  . Ulcerative colitis ([place])   . Wears glasses   . Wears hearing aid     Relevant past medical, surgical, family and social history reviewed and updated as indicated. Interim medical history since our last visit reviewed.  Review of Systems Per HPI unless specifically indicated above     Objective:    There were no vitals taken for this visit.  Wt Readings from Last 3  Encounters:  07/16/20 235 lb (106.6 kg)  05/06/20 234 lb (106.1 kg)  04/12/20 232 lb 6.4 oz (105.4 kg)    Physical Exam Physical examination unable to be performed due to lack of equipment.  Patient talking in complete sentences with occasional congested-sounding cough.    Assessment & Plan:  1. Upper respiratory tract infection, unspecified type Acute.  Likely viral etiology vs. seasonal allergies.  COVID test at home negative.  Reassured patient that symptoms and exam findings are most consistent with a viral upper respiratory infection and explained lack of efficacy of antibiotics against viruses.  Discussed expected course and features suggestive of secondary bacterial infection.  Continue supportive care. Increase fluid intake with water or electrolyte solution like pedialyte. Encouraged acetaminophen as  needed for fever/pain. Encouraged salt water gargling, chloraseptic spray and throat lozenges. Encouraged OTC guaifenesin. Encouraged saline sinus flushes and/or neti with humidified air.  Stop flonase as this appears to be drying nasal cavity.  Start prednisone burst 40 mg x 5 days given smoking history and little relief with albuterol inhaler.  Also start daily antihistamine.  Follow up if symptoms not improving near end of week.  If symptoms persist >2 weeks, return to clinic for re-evaluation.  With any sudden onset new chest pain, dizziness, sweating, or shortness of breath, go to ED.  - sodium chloride (OCEAN) 0.65 % SOLN nasal spray; Place 1 spray into both nostrils as needed for congestion.  Dispense: 88 mL; Refill: 0 - predniSONE (DELTASONE) 20 MG tablet; Take 2 tablets (40 mg total) by mouth daily with breakfast.  Dispense: 10 tablet; Refill: 0 - guaiFENesin (MUCINEX) 600 MG 12 hr tablet; Take 1 tablet (600 mg total) by mouth 2 (two) times daily as needed for cough or to loosen phlegm.  Dispense: 30 tablet; Refill: 0  2. Seasonal allergic rhinitis due to pollen Encouraged starting on  daily antihistamine to help with allergy symptoms. Stop flonase for now as this may be causing some irritation.  Resume after course of steroids in complete.    Follow up plan: Return if symptoms worsen or fail to improve.   This visit was completed via telephone due to the restrictions of the COVID-19 pandemic. All issues as above were discussed and addressed but no physical exam was performed. If it was felt that the patient should be evaluated in the office, they were directed there. The patient verbally consented to this visit. Patient was unable to complete an audio/visual visit due to Technical difficulties.  Link sent for Caregility, unable to visualize patient.  Visit converted to telephone call.  . Location of the patient: home . Location of the provider: work . Those involved with this call:  . Provider: Noemi Chapel, DNP, FNP-C . CMA: Annabelle Harman, CMA . Front Desk/Registration: Santina Evans  . Time spent on call: 9 minutes on the phone discussing health concerns. 13 minutes total spent in review of patient's record and preparation of their chart.  I verified patient identity using two factors (patient name and date of birth). Patient consents verbally to being seen via telemedicine visit today.

## 2020-11-12 DIAGNOSIS — M961 Postlaminectomy syndrome, not elsewhere classified: Secondary | ICD-10-CM | POA: Diagnosis not present

## 2020-11-12 DIAGNOSIS — Z79891 Long term (current) use of opiate analgesic: Secondary | ICD-10-CM | POA: Diagnosis not present

## 2020-11-26 ENCOUNTER — Other Ambulatory Visit: Payer: Self-pay

## 2020-11-26 MED ORDER — ATORVASTATIN CALCIUM 40 MG PO TABS: 1.0000 | ORAL_TABLET | Freq: Every day | ORAL | 4 refills | Status: DC

## 2020-12-02 ENCOUNTER — Telehealth: Payer: Self-pay | Admitting: Family Medicine

## 2020-12-02 NOTE — Telephone Encounter (Signed)
Patient's wife [name] dropped off paperwork for pharmacist [facility] Patient Assistance Program). Paperwork placed on pharmacist's desk.

## 2020-12-10 DIAGNOSIS — H401131 Primary open-angle glaucoma, bilateral, mild stage: Secondary | ICD-10-CM | POA: Diagnosis not present

## 2020-12-12 ENCOUNTER — Ambulatory Visit (INDEPENDENT_AMBULATORY_CARE_PROVIDER_SITE_OTHER): Payer: Medicare Other | Admitting: Pharmacist

## 2020-12-12 DIAGNOSIS — I10 Essential (primary) hypertension: Secondary | ICD-10-CM

## 2020-12-12 DIAGNOSIS — I4891 Unspecified atrial fibrillation: Secondary | ICD-10-CM

## 2020-12-12 DIAGNOSIS — E785 Hyperlipidemia, unspecified: Secondary | ICD-10-CM

## 2020-12-12 NOTE — Patient Instructions (Addendum)
Visit Information  Goals Addressed            This Visit's Progress   . Manage My Medicine       Timeframe:  Long-Range Goal Priority:  High Start Date:    12/12/20                         Expected End Date:   06/14/21                    Follow Up Date 04/02/21   - call for medicine refill 2 or 3 days before it runs out - call if I am sick and can't take my medicine - use a pillbox to sort medicine    Why is this important?   . These steps will help you keep on track with your medicines.   Notes: Patient assistance programs - Xarelto/Trintellix      Patient Care Plan: General Pharmacy (Adult)    Problem Identified: HTN, HLD, Afib   Priority: High  Onset Date: 12/12/2020    Goal: Patient-Specific Goal   Note:   Current Barriers:  . Unable to independently afford treatment regimen  Pharmacist Clinical Goal(s):  Marland Kitchen Patient will achieve adherence to monitoring guidelines and medication adherence to achieve therapeutic efficacy . maintain control of blood pressure as evidenced by home monitoring  . contact provider office for questions/concerns as evidenced notation of same in electronic health record through collaboration with PharmD and provider.   Interventions: . 1:1 collaboration with [name], MD regarding development and update of comprehensive plan of care as evidenced by provider attestation and co-signature . Inter-disciplinary care team collaboration (see longitudinal plan of care) . Comprehensive medication review performed; medication list updated in electronic medical record  Hypertension (BP goal <140/90) -Controlled -Current treatment: . None -Medications previously tried: metoprolol tartrate -Current home readings: 112/60s -Current exercise habits: patient is active, walking daily -Denies hypotensive/hypertensive symptoms -Educated on BP goals and benefits of medications for prevention of heart attack, stroke and kidney damage; Exercise goal  of 150 minutes per week; Importance of home blood pressure monitoring; Symptoms of hypotension and importance of maintaining adequate hydration; -Counseled to monitor BP at home a few times per week, document, and provide log at future appointments -Recommended to continue current medication  Hyperlipidemia: (LDL goal < 70) -Not ideally controlled -Current treatment: . Atorvastatin 9m daily -Medications previously tried: none noted  -Educated on Cholesterol goals;  Benefits of statin for ASCVD risk reduction; Importance of limiting foods high in cholesterol; Strategies to manage statin-induced myalgias; -Recommended to continue current medication  Atrial Fibrillation (Goal: prevent stroke and major bleeding) -Controlled  -Current treatment: . Rate control: none . Anticoagulation: Xarelto 214mdaily with SUPPER -Medications previously tried: metoprolol prn -Home BP and HR readings: controlled  -Counseled on increased risk of stroke due to Afib and benefits of anticoagulation for stroke prevention; importance of adherence to anticoagulant exactly as prescribed; bleeding risk associated with Xarelto and importance of self-monitoring for signs/symptoms of bleeding; -Recommended to continue current medication Assessed patient finances. Reports Xarelto copay is high especially when in donut hole, will research based on income level to see if he qualifies.   Patient Goals/Self-Care Activities . Patient will:  - take medications as prescribed check blood pressure weekly, document, and provide at future appointments collaborate with provider on medication access solutions target a minimum of 150 minutes of moderate intensity exercise weekly  Follow Up Plan:  The care management team will reach out to the patient again over the next 180 days.        The patient verbalized understanding of instructions, educational materials, and care plan provided today and agreed to receive a  mailed copy of patient instructions, educational materials, and care plan.  Telephone follow up appointment with pharmacy team member scheduled for: 6 months  Edythe Clarity, [facility]  Hypertension, Adult High blood pressure (hypertension) is when the force of blood pumping through the arteries is too strong. The arteries are the blood vessels that carry blood from the heart throughout the body. Hypertension forces the heart to work harder to pump blood and may cause arteries to become narrow or stiff. Untreated or uncontrolled hypertension can cause a heart attack, heart failure, a stroke, kidney disease, and other problems. A blood pressure reading consists of a higher number over a lower number. Ideally, your blood pressure should be below 120/80. The first ("top") number is called the systolic pressure. It is a measure of the pressure in your arteries as your heart beats. The second ("bottom") number is called the diastolic pressure. It is a measure of the pressure in your arteries as the heart relaxes. What are the causes? The exact cause of this condition is not known. There are some conditions that result in or are related to high blood pressure. What increases the risk? Some risk factors for high blood pressure are under your control. The following factors may make you more likely to develop this condition:  Smoking.  Having type 2 diabetes mellitus, high cholesterol, or both.  Not getting enough exercise or physical activity.  Being overweight.  Having too much fat, sugar, calories, or salt (sodium) in your diet.  Drinking too much alcohol. Some risk factors for high blood pressure may be difficult or impossible to change. Some of these factors include:  Having chronic kidney disease.  Having a family history of high blood pressure.  Age. Risk increases with age.  Race. You may be at higher risk if you are African American.  Gender. Men are at higher risk than women before  age 74. After age 73, women are at higher risk than men.  Having obstructive sleep apnea.  Stress. What are the signs or symptoms? High blood pressure may not cause symptoms. Very high blood pressure (hypertensive crisis) may cause:  Headache.  Anxiety.  Shortness of breath.  Nosebleed.  Nausea and vomiting.  Vision changes.  Severe chest pain.  Seizures. How is this diagnosed? This condition is diagnosed by measuring your blood pressure while you are seated, with your arm resting on a flat surface, your legs uncrossed, and your feet flat on the floor. The cuff of the blood pressure monitor will be placed directly against the skin of your upper arm at the level of your heart. It should be measured at least twice using the same arm. Certain conditions can cause a difference in blood pressure between your right and left arms. Certain factors can cause blood pressure readings to be lower or higher than normal for a short period of time:  When your blood pressure is higher when you are in a health care provider's office than when you are at home, this is called white coat hypertension. Most people with this condition do not need medicines.  When your blood pressure is higher at home than when you are in a health care provider's office, this is called masked hypertension. Most people with this  condition may need medicines to control blood pressure. If you have a high blood pressure reading during one visit or you have normal blood pressure with other risk factors, you may be asked to:  Return on a different day to have your blood pressure checked again.  Monitor your blood pressure at home for 1 week or longer. If you are diagnosed with hypertension, you may have other blood or imaging tests to help your health care provider understand your overall risk for other conditions. How is this treated? This condition is treated by making healthy lifestyle changes, such as eating healthy  foods, exercising more, and reducing your alcohol intake. Your health care provider may prescribe medicine if lifestyle changes are not enough to get your blood pressure under control, and if:  Your systolic blood pressure is above 130.  Your diastolic blood pressure is above 80. Your personal target blood pressure may vary depending on your medical conditions, your age, and other factors. Follow these instructions at home: Eating and drinking  Eat a diet that is high in fiber and potassium, and low in sodium, added sugar, and fat. An example eating plan is called the DASH (Dietary Approaches to Stop Hypertension) diet. To eat this way: ? Eat plenty of fresh fruits and vegetables. Try to fill one half of your plate at each meal with fruits and vegetables. ? Eat whole grains, such as whole-wheat pasta, brown rice, or whole-grain bread. Fill about one fourth of your plate with whole grains. ? Eat or drink low-fat dairy products, such as skim milk or low-fat yogurt. ? Avoid fatty cuts of meat, processed or cured meats, and poultry with skin. Fill about one fourth of your plate with lean proteins, such as fish, chicken without skin, beans, eggs, or tofu. ? Avoid pre-made and processed foods. These tend to be higher in sodium, added sugar, and fat.  Reduce your daily sodium intake. Most people with hypertension should eat less than 1,500 mg of sodium a day.  Do not drink alcohol if: ? Your health care provider tells you not to drink. ? You are pregnant, may be pregnant, or are planning to become pregnant.  If you drink alcohol: ? Limit how much you use to:  0-1 drink a day for women.  0-2 drinks a day for men. ? Be aware of how much alcohol is in your drink. In the [place] one drink equals one 12 oz bottle of beer (355 mL), one 5 oz glass of wine (148 mL), or one 1 oz glass of hard liquor (44 mL).   Lifestyle  Work with your health care provider to maintain a healthy body weight or to lose  weight. Ask what an ideal weight is for you.  Get at least 30 minutes of exercise most days of the week. Activities may include walking, swimming, or biking.  Include exercise to strengthen your muscles (resistance exercise), such as Pilates or lifting weights, as part of your weekly exercise routine. Try to do these types of exercises for 30 minutes at least 3 days a week.  Do not use any products that contain nicotine or tobacco, such as cigarettes, e-cigarettes, and chewing tobacco. If you need help quitting, ask your health care provider.  Monitor your blood pressure at home as told by your health care provider.  Keep all follow-up visits as told by your health care provider. This is important.   Medicines  Take over-the-counter and prescription medicines only as told by your health  care provider. Follow directions carefully. Blood pressure medicines must be taken as prescribed.  Do not skip doses of blood pressure medicine. Doing this puts you at risk for problems and can make the medicine less effective.  Ask your health care provider about side effects or reactions to medicines that you should watch for. Contact a health care provider if you:  Think you are having a reaction to a medicine you are taking.  Have headaches that keep coming back (recurring).  Feel dizzy.  Have swelling in your ankles.  Have trouble with your vision. Get help right away if you:  Develop a severe headache or confusion.  Have unusual weakness or numbness.  Feel faint.  Have severe pain in your chest or abdomen.  Vomit repeatedly.  Have trouble breathing. Summary  Hypertension is when the force of blood pumping through your arteries is too strong. If this condition is not controlled, it may put you at risk for serious complications.  Your personal target blood pressure may vary depending on your medical conditions, your age, and other factors. For most people, a normal blood pressure is  less than 120/80.  Hypertension is treated with lifestyle changes, medicines, or a combination of both. Lifestyle changes include losing weight, eating a healthy, low-sodium diet, exercising more, and limiting alcohol. This information is not intended to replace advice given to you by your health care provider. Make sure you discuss any questions you have with your health care provider. Document Revised: 03/30/2018 Document Reviewed: 03/30/2018 Elsevier Patient Education  2021 [employer].

## 2020-12-12 NOTE — Progress Notes (Signed)
Chronic Care Management Pharmacy Note  12/12/2020 Name:  [name] MRN:  [id number] DOB:  Dec 21, 1950  Subjective: [name] is an 70 y.o. year old male who is a primary patient of Pickard, Cammie Mcgee, MD.  The CCM team was consulted for assistance with disease management and care coordination needs.    Engaged with patient by telephone for follow up visit in response to provider referral for pharmacy case management and/or care coordination services.   Consent to Services:  The patient was given the following information about Chronic Care Management services today, agreed to services, and gave verbal consent: 1. CCM service includes personalized support from designated clinical staff supervised by the primary care provider, including individualized plan of care and coordination with other care providers 2. 24/7 contact phone numbers for assistance for urgent and routine care needs. 3. Service will only be billed when office clinical staff spend 20 minutes or more in a month to coordinate care. 4. Only one practitioner may furnish and bill the service in a calendar month. 5.The patient may stop CCM services at any time (effective at the end of the month) by phone call to the office staff. 6. The patient will be responsible for cost sharing (co-pay) of up to 20% of the service fee (after annual deductible is met). Patient agreed to services and consent obtained.  Patient Care Team: [name], MD as PCP - General (Family Medicine) [name], MD as PCP - Cardiology (Cardiology) Edythe Clarity, [facility] as Pharmacist (Pharmacist)  Recent office visits: 10/28/20 Edsel Petrin) - URTI, given prednisone 40m x 5 days and started on antihistamine  Recent consult visits: None recent  Hospital visits: None in previous 6 months  Objective:  Lab Results  Component Value Date   CREATININE 1.12 07/11/2020   BUN 16 07/11/2020   GFR 80.20 01/01/2016   GFRNONAA 66  07/11/2020   GFRAA 76 07/11/2020   NA 142 07/11/2020   K 4.4 07/11/2020   CALCIUM 9.9 07/11/2020   CO2 27 07/11/2020   GLUCOSE 111 (H) 07/11/2020    Lab Results  Component Value Date/Time   HGBA1C 5.1 07/19/2018 03:23 AM   GFR 80.20 01/01/2016 10:04 AM    Last diabetic Eye exam: No results found for: HMDIABEYEEXA  Last diabetic Foot exam: No results found for: HMDIABFOOTEX   Lab Results  Component Value Date   CHOL 165 07/11/2020   HDL 52 07/11/2020   LDLCALC 97 07/11/2020   TRIG 74 07/11/2020   CHOLHDL 3.2 07/11/2020    Hepatic Function Latest Ref Rng & Units 07/11/2020 05/25/2019 07/19/2018  Total Protein 6.1 - 8.1 g/dL 7.0 6.8 6.3(L)  Albumin 3.5 - 5.0 g/dL - - 3.1(L)  AST 10 - 35 U/L 23 25 27   ALT 9 - 46 U/L 28 38 27  Alk Phosphatase 38 - 126 U/L - - 50  Total Bilirubin 0.2 - 1.2 mg/dL 0.8 0.8 1.0  Bilirubin, Direct 0.0 - 0.3 mg/dL - - -    Lab Results  Component Value Date/Time   TSH 3.90 07/11/2020 08:24 AM   TSH 3.48 05/25/2019 08:54 AM    CBC Latest Ref Rng & Units 07/11/2020 05/25/2019 07/20/2018  WBC 3.8 - 10.8 Thousand/uL 5.3 5.3 8.3  Hemoglobin 13.2 - 17.1 g/dL 15.8 16.0 11.5(L)  Hematocrit 38.5 - 50.0 % 45.7 46.8 33.8(L)  Platelets 140 - 400 Thousand/uL 169 175 181    No results found for: VD25OH  Clinical ASCVD: No  The  10-year ASCVD risk score Mikey Bussing DC Brooke Bonito., et al., 2013) is: 13.6%   Values used to calculate the score:     Age: 62 years     Sex: Male     Is Non-Hispanic African American: No     Diabetic: No     Tobacco smoker: No     Systolic Blood Pressure: 116 mmHg     Is BP treated: No     HDL Cholesterol: 52 mg/dL     Total Cholesterol: 165 mg/dL    Depression screen [facility] 2/9 07/16/2020 05/06/2020 05/25/2019  Decreased Interest 0 0 0  Down, Depressed, Hopeless 0 0 0  PHQ - 2 Score 0 0 0  Altered sleeping - - -  Tired, decreased energy - - -  Change in appetite - - -  Feeling bad or failure about yourself  - - -  Trouble  concentrating - - -  Moving slowly or fidgety/restless - - -  Suicidal thoughts - - -  PHQ-9 Score - - -  Difficult doing work/chores - - -     Social History   Tobacco Use  Smoking Status Former Smoker  . Packs/day: 1.00  . Years: 19.00  . Pack years: 19.00  . Types: Cigarettes  . Quit date: 08/04/1983  . Years since quitting: 37.3  Smokeless Tobacco Never Used  Tobacco Comment   quit smoking 1985   BP Readings from Last 3 Encounters:  07/16/20 110/60  05/06/20 (!) 120/58  04/12/20 122/66   Pulse Readings from Last 3 Encounters:  07/16/20 75  05/06/20 66  04/12/20 67   Wt Readings from Last 3 Encounters:  07/16/20 235 lb (106.6 kg)  05/06/20 234 lb (106.1 kg)  04/12/20 232 lb 6.4 oz (105.4 kg)   BMI Readings from Last 3 Encounters:  07/16/20 36.81 kg/m  05/06/20 36.65 kg/m  04/12/20 36.40 kg/m    Assessment/Interventions: Review of patient past medical history, allergies, medications, health status, including review of consultants reports, laboratory and other test data, was performed as part of comprehensive evaluation and provision of chronic care management services.   SDOH:  (Social Determinants of Health) assessments and interventions performed: Yes   Financial Resource Strain: Low Risk   . Difficulty of Paying Living Expenses: Not very hard    SDOH Screenings   Alcohol Screen: Not on file  Depression (PHQ2-9): Low Risk   . PHQ-2 Score: 0  Financial Resource Strain: Low Risk   . Difficulty of Paying Living Expenses: Not very hard  Food Insecurity: Not on file  Housing: Not on file  Physical Activity: Not on file  Social Connections: Not on file  Stress: Not on file  Tobacco Use: Medium Risk  . Smoking Tobacco Use: Former Smoker  . Smokeless Tobacco Use: Never Used  Transportation Needs: Not on file    CCM Care Plan  Allergies  Allergen Reactions  . Adhesive [Tape] Other (See Comments)    Causes blisters - pls use paper tape  . Other  Other (See Comments)    "Adhesives"Pt allergic to some tapes--pt request that we only use paper tape!!    Medications Reviewed Today    Reviewed by Eulogio Bear, NP (Nurse Practitioner) on 10/28/20 at 1306  Med List Status: <None>  Medication Order Taking? Sig Documenting Provider Last Dose Status Informant  albuterol (VENTOLIN HFA) 108 (90 Base) MCG/ACT inhaler [id number] Yes Inhale 2 puffs into the lungs every 4 (four) hours as needed for wheezing or shortness of  breath. [name], MD Taking Active   atorvastatin (LIPITOR) 40 MG tablet [id number] Yes Take 1 tablet by mouth once daily [name], MD Taking Active   Calcium Carbonate-Vitamin D (CALCIUM + D PO) [id number] Yes Take 1 tablet by mouth at bedtime.  [provider] Taking Active Self  fentaNYL (DURAGESIC - DOSED MCG/HR) 25 MCG/HR [id number] Yes Place 25 mcg onto the skin every 3 (three) days.  [provider] Taking Active Self           Med Note Vladimir Creeks, CHRISTINA H   Mon May 15, 2019 10:06 AM)    fluticasone (FLONASE) 50 MCG/ACT nasal spray [id number] Yes Place 1 spray into both nostrils at bedtime as needed for allergies or rhinitis.  [provider] Taking Active Self  guaiFENesin (MUCINEX) 600 MG 12 hr tablet [id number] Yes Take 1 tablet (600 mg total) by mouth 2 (two) times daily as needed for cough or to loosen phlegm. Eulogio Bear, NP  Active   magnesium oxide (MAG-OX) 400 MG tablet [id number] Yes Take 400 mg by mouth at bedtime.  [provider] Taking Active Self  meclizine (ANTIVERT) 25 MG tablet [id number] Yes Take 1 tablet (25 mg total) by mouth 3 (three) times daily as needed for dizziness. [name], MD Taking Active   Multiple Vitamin (MULTIVITAMIN WITH MINERALS) TABS tablet [id number] Yes Take 1 tablet by mouth at bedtime.  [provider] Taking Active Self  Omega-3 Fatty Acids (FISH OIL) 500 MG CAPS [id number] Yes Take 500 mg by mouth at bedtime.   [provider] Taking Active Self  predniSONE (DELTASONE) 20 MG tablet [id number] Yes Take 2 tablets (40 mg total) by mouth daily with breakfast. Eulogio Bear, NP  Active   PRESCRIPTION MEDICATION [id number] Yes Inhale into the lungs at bedtime. CPAP [provider] Taking Active Self  rivaroxaban (XARELTO) 20 MG TABS tablet [id number] Yes TAKE 1 TABLET BY MOUTH ONCE DAILY WITH SUPPER [name], MD Taking Active   sodium chloride (OCEAN) 0.65 % SOLN nasal spray [id number] Yes Place 1 spray into both nostrils as needed for congestion. Eulogio Bear, NP  Active   traZODone (DESYREL) 50 MG tablet [id number] Yes Take 100 mg by mouth at bedtime. [provider] Taking Active Self  vortioxetine HBr (TRINTELLIX) 10 MG TABS tablet [id number] Yes Take 1 tablet (10 mg total) by mouth at bedtime. [name], MD Taking Active   Med List Note Daune Perch, NP 08/06/18 1403):            Patient Active Problem List   Diagnosis Date Noted  . Nonocclusive coronary atherosclerosis of native coronary artery 10/08/2019  . Former smoker 09/26/2019  . Cough 09/28/2018  . Nasal congestion 09/28/2018  . Paroxysmal atrial fibrillation ([place]) 08/25/2018  . Cellulitis 07/18/2018  . Essential hypertension 07/18/2018  . Hyperlipidemia 07/18/2018  . Allergic reaction 07/18/2018  . Atrial fibrillation with RVR ([place]) 07/18/2018  . H/O total shoulder replacement, left 07/15/2018  . S/P shoulder replacement, right 01/28/2018  . Robotic XI TAPP  bilateral inguinal hernia repair Dec 2017 07/31/2016  . S/P bilateral inguinal herniorrhaphy 07/31/2016  . Lumbar post-laminectomy syndrome   . 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[place]) 11/17/2013  . OA (osteoarthritis) of knee 11/17/2013  . OSA (obstructive sleep apnea) 12/05/2012  . GERD 12/23/2007  . RLQ PAIN 12/23/2007  . PERSONAL HX COLONIC POLYPS 12/23/2007    Immunization History  Administered Date(s)  Administered  . Fluad Quad(high  Dose 65+) 04/06/2019, 05/01/2020  . Influenza Split 07/04/2012, 03/03/2013  . Influenza,inj,Quad PF,6+ Mos 05/03/2014, 04/30/2015, 04/03/2016, 04/08/2017, 04/11/2018  . Influenza,inj,quad, With Preservative 05/12/2017  . Moderna Sars-Covid-2 Vaccination 09/14/2019, 06/06/2020  . PFIZER(Purple Top)SARS-COV-2 Vaccination 10/13/2019  . Pneumococcal Conjugate-13 05/03/2014  . Pneumococcal Polysaccharide-23 08/03/2008, 04/03/2016  . Tdap 08/03/2010  . Zoster 08/03/2010    Conditions to be addressed/monitored:  HTN, HLD, Afib  Care Plan : General Pharmacy (Adult)  Updates made by Edythe Clarity, RPH since 12/12/2020 12:00 AM    Problem: HTN, HLD, Afib   Priority: High  Onset Date: 12/12/2020    Goal: Patient-Specific Goal   Note:   Current Barriers:  . Unable to independently afford treatment regimen  Pharmacist Clinical Goal(s):  Marland Kitchen Patient will achieve adherence to monitoring guidelines and medication adherence to achieve therapeutic efficacy . maintain control of blood pressure as evidenced by home monitoring  . contact provider office for questions/concerns as evidenced notation of same in electronic health record through collaboration with PharmD and provider.   Interventions: . 1:1 collaboration with [name], MD regarding development and update of comprehensive plan of care as evidenced by provider attestation and co-signature . Inter-disciplinary care team collaboration (see longitudinal plan of care) . Comprehensive medication review performed; medication list updated in electronic medical record  Hypertension (BP goal <140/90) -Controlled -Current treatment: . None -Medications previously tried: metoprolol tartrate -Current home readings: 112/60s -Current exercise habits: patient is active, walking daily -Denies hypotensive/hypertensive symptoms -Educated on BP goals and benefits of medications for prevention of heart attack,  stroke and kidney damage; Exercise goal of 150 minutes per week; Importance of home blood pressure monitoring; Symptoms of hypotension and importance of maintaining adequate hydration; -Counseled to monitor BP at home a few times per week, document, and provide log at future appointments -Recommended to continue current medication  Hyperlipidemia: (LDL goal < 70) -Not ideally controlled -Current treatment: . Atorvastatin 41m daily -Medications previously tried: none noted  -Educated on Cholesterol goals;  Benefits of statin for ASCVD risk reduction; Importance of limiting foods high in cholesterol; Strategies to manage statin-induced myalgias; -Recommended to continue current medication  Atrial Fibrillation (Goal: prevent stroke and major bleeding) -Controlled  -Current treatment: . Rate control: none . Anticoagulation: Xarelto 257mdaily with SUPPER -Medications previously tried: metoprolol prn -Home BP and HR readings: controlled  -Counseled on increased risk of stroke due to Afib and benefits of anticoagulation for stroke prevention; importance of adherence to anticoagulant exactly as prescribed; bleeding risk associated with Xarelto and importance of self-monitoring for signs/symptoms of bleeding; -Recommended to continue current medication Assessed patient finances. Reports Xarelto copay is high especially when in donut hole, will research based on income level to see if he qualifies.   Patient Goals/Self-Care Activities . Patient will:  - take medications as prescribed check blood pressure weekly, document, and provide at future appointments collaborate with provider on medication access solutions target a minimum of 150 minutes of moderate intensity exercise weekly  Follow Up Plan: The care management team will reach out to the patient again over the next 180 days.        Medication Assistance: Application for [employer]   medication assistance program. in  process.  Anticipated assistance start date unknown at this time.  Application faxed [phone] See plan of care for additional detail.  Patient[place] pharmacy is:  [place] [address] [place]: [phone]  Fax: [place] [address] [place] [address] Phone: [phone] Fax: [phone]  Uses pill box? Yes Pt endorses 100% compliance  We discussed: Benefits of medication synchronization, packaging and delivery as well as enhanced pharmacist oversight with [employer]. Patient decided to: Continue current medication management strategy  Care Plan and Follow Up Patient Decision:  Patient agrees to Care Plan and Follow-up.  Plan: The care management team will reach out to the patient again over the next 180 days.  Beverly Milch, PharmD Clinical Pharmacist [place] [phone]

## 2021-01-02 ENCOUNTER — Telehealth: Payer: Self-pay | Admitting: Pharmacist

## 2021-01-02 NOTE — Progress Notes (Addendum)
    Chronic Care Management Pharmacy Assistant   Name: [name]  MRN: [id number] DOB: 1950/11/23  Reason for Encounter: PAP  Medications: Outpatient Encounter Medications as of 01/02/2021  Medication Sig   albuterol (VENTOLIN HFA) 108 (90 Base) MCG/ACT inhaler Inhale 2 puffs into the lungs every 4 (four) hours as needed for wheezing or shortness of breath.   atorvastatin (LIPITOR) 40 MG tablet Take 1 tablet (40 mg total) by mouth daily.   Calcium Carbonate-Vitamin D (CALCIUM + D PO) Take 1 tablet by mouth at bedtime.    fentaNYL (DURAGESIC - DOSED MCG/HR) 25 MCG/HR Place 25 mcg onto the skin every 3 (three) days.    fluticasone (FLONASE) 50 MCG/ACT nasal spray Place 1 spray into both nostrils at bedtime as needed for allergies or rhinitis.    guaiFENesin (MUCINEX) 600 MG 12 hr tablet Take 1 tablet (600 mg total) by mouth 2 (two) times daily as needed for cough or to loosen phlegm.   magnesium oxide (MAG-OX) 400 MG tablet Take 400 mg by mouth at bedtime.    meclizine (ANTIVERT) 25 MG tablet Take 1 tablet (25 mg total) by mouth 3 (three) times daily as needed for dizziness.   Multiple Vitamin (MULTIVITAMIN WITH MINERALS) TABS tablet Take 1 tablet by mouth at bedtime.    Omega-3 Fatty Acids (FISH OIL) 500 MG CAPS Take 500 mg by mouth at bedtime.    PRESCRIPTION MEDICATION Inhale into the lungs at bedtime. CPAP   rivaroxaban (XARELTO) 20 MG TABS tablet TAKE 1 TABLET BY MOUTH ONCE DAILY WITH SUPPER   sodium chloride (OCEAN) 0.65 % SOLN nasal spray Place 1 spray into both nostrils as needed for congestion.   traZODone (DESYREL) 50 MG tablet Take 100 mg by mouth at bedtime.   vortioxetine HBr (TRINTELLIX) 10 MG TABS tablet Take 1 tablet (10 mg total) by mouth at bedtime.   No facility-administered encounter medications on file as of 01/02/2021.    New patient assistance application form filled out to [employer] for [employer]. Waiting for patient and provider to complete and sign  documentation. Called patient to inquire if they wanted the application mailed to them or if they wanted to come into the office. Patient is required to sign application and to bring/have proof of income. He stated he would be willing to come into office to bring proof of income and sign application once he receives the paperwork in the mail he would like it mailed to their residence address [place] [address].  Also, patient stated he is currently receiving his medication [employer] through [facility].   Follow-Up:Pharmacist Review  Charlann Lange, RMA Clinical Pharmacist Assistant [phone]  5 minutes spent in review, coordination, and documentation.  Reviewed by: Beverly Milch, PharmD Clinical Pharmacist [facility] [phone]

## 2021-01-31 ENCOUNTER — Other Ambulatory Visit: Payer: Self-pay

## 2021-01-31 ENCOUNTER — Encounter: Payer: Self-pay | Admitting: Pulmonary Disease

## 2021-01-31 ENCOUNTER — Ambulatory Visit (INDEPENDENT_AMBULATORY_CARE_PROVIDER_SITE_OTHER): Payer: Medicare Other | Admitting: Pulmonary Disease

## 2021-01-31 VITALS — BP 120/58 | HR 58 | Temp 98.3°F | Ht 67.0 in | Wt 233.1 lb

## 2021-01-31 DIAGNOSIS — G4733 Obstructive sleep apnea (adult) (pediatric): Secondary | ICD-10-CM

## 2021-01-31 DIAGNOSIS — J310 Chronic rhinitis: Secondary | ICD-10-CM | POA: Diagnosis not present

## 2021-01-31 DIAGNOSIS — J301 Allergic rhinitis due to pollen: Secondary | ICD-10-CM

## 2021-01-31 NOTE — Patient Instructions (Signed)
Follow up in 1 year.

## 2021-01-31 NOTE — Progress Notes (Signed)
[facility]  Chief Complaint  Patient presents with   Follow-up    Followed for OSA.  Using cpap every night.  No issues     Constitutional:  BP (!) 120/58   Pulse (!) 58   Temp 98.3 F (36.8 C) (Oral)   Ht 5' 7"  (1.702 m)   Wt 233 lb 2 oz (105.7 kg)   SpO2 98%   BMI 36.51 kg/m   Past Medical History:  HTN, A fib, HLD, GERD, Ulcerative colitis, Back pain, Nephrolithiasis, IBS, HH, Glaucoma, DVT 2001 and 2002, Diverticulosis, Depression, Colon polyp, Cataracts, Squamous cell skin cancer, BPH, OA  Past Surgical History:  He  has a past surgical history that includes right wrist fusion 12/12 - pt wearing brace on the wrist-not started physical therapy yet (Right); cervical fusion 2011--pt has good range of motion neck; bilateral carpal tunnel release 2009; Back surgery (2008 ); nissen fundoplication 1751; Cholecystectomy (1994); Appendectomy (1993); Transurethral resection of prostate (10/09/2011); Prostate surgery (2014); Spine surgery (2009); mass removed from 2nd finger (Left, aug 2014); uvvvp; Total knee revision (Right, 11/17/2013); Mass excision (Left, 01/04/2014); Joint replacement (Bilateral); XI Robotic assisted inguinal hernia repair with mesh (Bilateral, 07/31/2016); Insertion of mesh (Bilateral, 07/31/2016); Total shoulder arthroplasty (Right, 01/28/2018); thyroid nodule; Total shoulder arthroplasty (Left, 07/15/2018); and Eye surgery (N/A).  Brief Summary:  [name] is a 70 y.o. male former smoker with obstructive sleep apnea.      Subjective:   Uses CPAP nightly.  Occasional mask leak.  Gets sinus congestion especially after working outside and then has trouble using CPAP.  Using flonase and OTC antihistamine.  Feels rested.  Physical Exam:   Appearance - well kempt   ENMT - no sinus tenderness, no oral exudate, no LAN, Mallampati 3 airway, no stridor  Respiratory - equal breath sounds bilaterally, no wheezing or  rales  CV - s1s2 regular rate and rhythm, no murmurs  Ext - no clubbing, no edema  Skin - no rashes  Psych - normal mood and affect   Pulmonary testing:  FeNO 09/14/18 >> 24 Spirometry 09/14/18 >> FEV1 2.4 (83%), FEV1% 83  Sleep Tests:  PSG 12/21/91 >> AHI 56 Auto CPAP 01/01/21 to 01/30/21 >> used on 30 of 30 nights with average 7 hrs 52 min.  Average AHI 2.9 with median CPAP 11 and 95 th percentile CPAP 13 cm H2O  Cardiac Tests:  Echo 07/19/18 >> EF 60 to 65%, grade 2 DD  Social History:  He  reports that he quit smoking about 37 years ago. His smoking use included cigarettes. He has a 19.00 pack-year smoking history. He has never used smokeless tobacco. He reports current alcohol use of about 6.0 standard drinks of alcohol per week. He reports that he does not use drugs.  Family History:  His family history includes Arthritis in his mother; Bone cancer in his father; Cancer in his father; Colon cancer in his father; Deep vein thrombosis in his mother; Diabetes in his father, paternal grandfather, and sister; Hearing loss in his maternal grandmother and mother; Hyperlipidemia in his mother; Hypertension in his mother; Prostate cancer in his father.     Assessment/Plan:   Obstructive sleep apnea. - he is compliant with therapy and reports benefit - he uses [employer] for his DME - continue auto CPAP 5 to 20 cm H2O   Allergic rhinitis with CPAP rhinitis. - adjust temperature setting on CPAP humidifier - continue flonase and OTC antihistamine  Obesity. - discussed importance of weight loss  Time Spent Involved in Patient Care on Day of Examination:  21 minutes  Follow up:   Patient Instructions  Follow up in 1 year  Medication List:   Allergies as of 01/31/2021       Reactions   Adhesive [tape] Other (See Comments)   Causes blisters - pls use paper tape   Other Other (See Comments)   "Adhesives"Pt allergic to some tapes--pt request that we only use paper tape!!         Medication List        Accurate as of January 31, 2021 10:16 AM. If you have any questions, ask your nurse or doctor.          STOP taking these medications    guaiFENesin 600 MG 12 hr tablet Commonly known as: Mucinex Stopped by: Chesley Mires, MD   magnesium oxide 400 MG tablet Commonly known as: MAG-OX Stopped by: Chesley Mires, MD       TAKE these medications    albuterol 108 (90 Base) MCG/ACT inhaler Commonly known as: VENTOLIN HFA Inhale 2 puffs into the lungs every 4 (four) hours as needed for wheezing or shortness of breath.   atorvastatin 40 MG tablet Commonly known as: LIPITOR Take 1 tablet (40 mg total) by mouth daily.   CALCIUM + D PO Take 1 tablet by mouth at bedtime.   fentaNYL 25 MCG/HR Commonly known as: DURAGESIC Place 25 mcg onto the skin every 3 (three) days.   Fish Oil 500 MG Caps Take 500 mg by mouth at bedtime.   fluticasone 50 MCG/ACT nasal spray Commonly known as: FLONASE Place 1 spray into both nostrils at bedtime as needed for allergies or rhinitis.   meclizine 25 MG tablet Commonly known as: ANTIVERT Take 1 tablet (25 mg total) by mouth 3 (three) times daily as needed for dizziness.   multivitamin with minerals Tabs tablet Take 1 tablet by mouth at bedtime.   PRESCRIPTION MEDICATION Inhale into the lungs at bedtime. CPAP   rivaroxaban 20 MG Tabs tablet Commonly known as: Xarelto TAKE 1 TABLET BY MOUTH ONCE DAILY WITH SUPPER   sodium chloride 0.65 % Soln nasal spray Commonly known as: OCEAN Place 1 spray into both nostrils as needed for congestion.   traZODone 50 MG tablet Commonly known as: DESYREL Take 100 mg by mouth at bedtime.   vortioxetine HBr 10 MG Tabs tablet Commonly known as: Tr[employer]ix Take 1 tablet (10 mg total) by mouth at bedtime.        Signature:  Chesley Mires, MD [place] Pager - [phone] 01/31/2021, 10:16 AM

## 2021-02-04 ENCOUNTER — Telehealth: Payer: Self-pay | Admitting: Pharmacist

## 2021-02-04 NOTE — Progress Notes (Addendum)
Chronic Care Management Pharmacy Assistant   Name: SABIEN UMLAND  MRN: [id number] DOB: [birth year]  Reason for Encounter: Disease State For HTN.   Conditions to be addressed/monitored: HTN, HLD, Afib  Recent office visits:  None since 01/02/21  Recent consult visits:  01/31/21 Chesley Mires, MD. For follow-up. STOPPED Guaifenesin and Magnesium.   Hospital visits:  None since 01/02/21  Medications: Outpatient Encounter Medications as of 02/04/2021  Medication Sig   albuterol (VENTOLIN HFA) 108 (90 Base) MCG/ACT inhaler Inhale 2 puffs into the lungs every 4 (four) hours as needed for wheezing or shortness of breath.   atorvastatin (LIPITOR) 40 MG tablet Take 1 tablet (40 mg total) by mouth daily.   Calcium Carbonate-Vitamin D (CALCIUM + D PO) Take 1 tablet by mouth at bedtime.    fentaNYL (DURAGESIC - DOSED MCG/HR) 25 MCG/HR Place 25 mcg onto the skin every 3 (three) days.    fluticasone (FLONASE) 50 MCG/ACT nasal spray Place 1 spray into both nostrils at bedtime as needed for allergies or rhinitis.    meclizine (ANTIVERT) 25 MG tablet Take 1 tablet (25 mg total) by mouth 3 (three) times daily as needed for dizziness.   Multiple Vitamin (MULTIVITAMIN WITH MINERALS) TABS tablet Take 1 tablet by mouth at bedtime.    Omega-3 Fatty Acids (FISH OIL) 500 MG CAPS Take 500 mg by mouth at bedtime.    PRESCRIPTION MEDICATION Inhale into the lungs at bedtime. CPAP   rivaroxaban (XARELTO) 20 MG TABS tablet TAKE 1 TABLET BY MOUTH ONCE DAILY WITH SUPPER   sodium chloride (OCEAN) 0.65 % SOLN nasal spray Place 1 spray into both nostrils as needed for congestion.   traZODone (DESYREL) 50 MG tablet Take 100 mg by mouth at bedtime.   vortioxetine HBr (TRINTELLIX) 10 MG TABS tablet Take 1 tablet (10 mg total) by mouth at bedtime.   No facility-administered encounter medications on file as of 02/04/2021.   Reviewed chart prior to disease state call. Spoke with patient regarding BP  Recent Office  Vitals: BP Readings from Last 3 Encounters:  01/31/21 (!) 120/58  07/16/20 110/60  05/06/20 (!) 120/58   Pulse Readings from Last 3 Encounters:  01/31/21 (!) 58  07/16/20 75  05/06/20 66    Wt Readings from Last 3 Encounters:  01/31/21 233 lb 2 oz (105.7 kg)  07/16/20 235 lb (106.6 kg)  05/06/20 234 lb (106.1 kg)     Kidney Function Lab Results  Component Value Date/Time   CREATININE 1.12 07/11/2020 08:24 AM   CREATININE 0.96 05/25/2019 08:54 AM   GFR 80.20 01/01/2016 10:04 AM   GFRNONAA 66 07/11/2020 08:24 AM   GFRAA 76 07/11/2020 08:24 AM    BMP Latest Ref Rng & Units 07/11/2020 05/25/2019 05/18/2019  Glucose 65 - 99 mg/dL 111(H) 109(H) 95  BUN 7 - 25 mg/dL 16 14 17   Creatinine 0.70 - 1.18 mg/dL 1.12 0.96 0.98  BUN/Creat Ratio 6 - 22 (calc) NOT APPLICABLE NOT APPLICABLE 17  Sodium 009 - 146 mmol/L 142 141 139  Potassium 3.5 - 5.3 mmol/L 4.4 4.6 4.0  Chloride 98 - 110 mmol/L 106 103 102  CO2 20 - 32 mmol/L 27 27 25   Calcium 8.6 - 10.3 mg/dL 9.9 9.6 9.6    Current antihypertensive regimen:  None.  How often are you checking your Blood Pressure? when feeling symptomatic  Current home BP readings: Patient stated his smart watch broke so he has not be able to check his blood pressure.  What recent interventions/DTPs have been made by any provider to improve Blood Pressure control since last CPP Visit: None.   Any recent hospitalizations or ED visits since last visit with CPP? Patient stated no.  What diet changes have been made to improve Blood Pressure Control?  Patient stated his appetite is good. He stated he tries to eat a well rounded diet.   What exercise is being done to improve your Blood Pressure Control?  Patient stated he is very active he is outside in the yard a lot and works on his cars.    Adherence Review: Is the patient currently on ACE/ARB medication? N/A  Does the patient have >5 day gap between last estimated fill dates?   Star Rating  Drugs: Atorvastatin 40 mg 30 DS 01/25/21  Follow-Up:Pharmacist Review  Charlann Lange, RMA Clinical Pharmacist Assistant [phone]  10 minutes spent in review, coordination, and documentation.  Reviewed by: Beverly Milch, PharmD Clinical Pharmacist [facility] [phone]

## 2021-03-11 DIAGNOSIS — H401131 Primary open-angle glaucoma, bilateral, mild stage: Secondary | ICD-10-CM | POA: Diagnosis not present

## 2021-03-15 DIAGNOSIS — U071 COVID-19: Secondary | ICD-10-CM | POA: Diagnosis not present

## 2021-04-08 ENCOUNTER — Encounter (HOSPITAL_BASED_OUTPATIENT_CLINIC_OR_DEPARTMENT_OTHER): Payer: Self-pay | Admitting: Cardiology

## 2021-04-08 ENCOUNTER — Ambulatory Visit (INDEPENDENT_AMBULATORY_CARE_PROVIDER_SITE_OTHER): Payer: Medicare Other | Admitting: Cardiology

## 2021-04-08 ENCOUNTER — Other Ambulatory Visit: Payer: Self-pay

## 2021-04-08 VITALS — BP 120/80 | HR 63 | Ht 67.0 in | Wt 238.0 lb

## 2021-04-08 DIAGNOSIS — I2510 Atherosclerotic heart disease of native coronary artery without angina pectoris: Secondary | ICD-10-CM

## 2021-04-08 DIAGNOSIS — I480 Paroxysmal atrial fibrillation: Secondary | ICD-10-CM | POA: Diagnosis not present

## 2021-04-08 DIAGNOSIS — Z7189 Other specified counseling: Secondary | ICD-10-CM | POA: Diagnosis not present

## 2021-04-08 DIAGNOSIS — I10 Essential (primary) hypertension: Secondary | ICD-10-CM | POA: Diagnosis not present

## 2021-04-08 DIAGNOSIS — E7800 Pure hypercholesterolemia, unspecified: Secondary | ICD-10-CM | POA: Diagnosis not present

## 2021-04-08 NOTE — Progress Notes (Signed)
Cardiology Office Note:    Date:  04/08/2021   ID:  [name], DOB 1950-10-19, MRN [id number]  PCP:  [name], MD  Cardiologist:  [name], MD PhD  Referring MD: [name], MD   CC: followup  History of Present Illness:    [name] is a 70 y.o. male with a hx of nonobstructive CAD, hypertension, hyperlipidemia, prior atrial fibrillation with RVR who is seen in follow up for the evaluation and management of afib, hypertension, hyperlipidemia.  [name] was hospitalized in 33/2951 due to complications from urinary catheter placed during recent shoulder surgery, but he was also found to have new onset atrial fibrillation with RVR. He spontaneously converted to NSR. He was discharged on rivaroxaban.  Today:   Patient is feeling all right. He has chronic low back pain on both sides and feels like a "knife in back". He takes Fentanyl and had back injections but he reports not helpful. He also has occasional chest pains on the left side of chest that are sharp but not persistent. He reported he still sweats however not as frequent as before.   He cut trees and split them with his neighbors for activity but denies bleeding.   Denies shortness of breath at rest or with normal exertion. No PND, orthopnea, LE edema or unexpected weight gain. No syncope or palpitations.    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   Cataracts, both eyes    worst on left   Colon polyps    Complication of anesthesia    pt had spinal for a knee replacement--"woke up" during surgery--and sick after surgery   Depression    Diverticulosis     DVT (deep venous thrombosis) ([place]) 2001 or 2002   right leg-required hospitalization x 8 days   DVT (deep venous thrombosis) ([place]) 1996   right   Glaucoma    both eyes   H/O hiatal hernia    Hearing impaired person, bilateral    bilateral   Hemorrhoid    History of IBS    History of kidney stones    x 1-passed   Hyperlipidemia 2012   Hypertension    Lumbago    Lumbar post-laminectomy syndrome    PONV (postoperative nausea and vomiting)    Sleep apnea    pt uses cpap - setting of 16   Ulcer 1966   Ulcerative colitis ([place])    Wears glasses    Wears hearing aid     Past Surgical History:  Procedure Laterality Date   APPENDECTOMY  1993   BACK SURGERY  2008    bone spur removed    bilateral carpal tunnel release 2009     cervical fusion 2011--pt has good range of motion neck     CHOLECYSTECTOMY  1994   EYE SURGERY N/A    [employer] 07/13/2020   INSERTION OF MESH Bilateral 07/31/2016   Procedure: INSERTION OF MESH;  Surgeon: [name], MD;  Location: [facility];  Service: General;  Laterality: Bilateral;   JOINT REPLACEMENT Bilateral    2001  left total knee and 1985 right total knee   MASS EXCISION Left 01/04/2014   Procedure: LEFT MIDDLE FINGER MASS EXCISION WITH NAILBED REPAIR AND ADVANCEMENT FLAP;  Surgeon: [name], MD;  Location: [facility];  Service: Orthopedics;  Laterality: Left;   mass removed from 2nd finger Left aug 2014   squamous carcinoma   nissen fundoplication 1975     PROSTATE SURGERY  2014   TURP   right wrist fusion 12/12 - pt wearing brace on the wrist-not started physical therapy yet Right    fusion prevent limited ROM to right wrist   SPINE SURGERY  2009   cervical spine fusion   thyroid nodule     removed   TOTAL KNEE REVISION Right 11/17/2013   Procedure: RIGHT KNEE POLYETHYLENE REVISION, bone graft;  Surgeon: [name], MD;  Location: [facility];  Service: Orthopedics;  Laterality: Right;   TOTAL SHOULDER ARTHROPLASTY  Right 01/28/2018   Procedure: RIGHT TOTAL SHOULDER ARTHROPLASTY;  Surgeon: [name], MD;  Location: [facility];  Service: Orthopedics;  Laterality: Right;   TOTAL SHOULDER ARTHROPLASTY Left 07/15/2018   Procedure: LEFT SHOULDER ANATOMIC TOTAL SHOULDER ARTHROPLASTY;  Surgeon: [name], MD;  Location: [facility];  Service: Orthopedics;  Laterality: Left;   TRANSURETHRAL RESECTION OF PROSTATE  10/09/2011   Procedure: TRANSURETHRAL RESECTION OF THE PROSTATE WITH GYRUS INSTRUMENTS;  Surgeon: Fredricka Bonine, MD;  Location: [facility];  Service: Urology;  Laterality: N/A;   uvvvp     XI ROBOTIC ASSISTED INGUINAL HERNIA REPAIR WITH MESH Bilateral 07/31/2016   Procedure: XI ROBOTIC BILATERAL INGUINAL HERNIA REPAIR WITH MESH;  Surgeon: [name], MD;  Location: [facility];  Service: General;  Laterality: Bilateral;    Current Medications: Current Outpatient Medications on File Prior to Visit  Medication Sig   albuterol (VENTOLIN HFA) 108 (90 Base) MCG/ACT inhaler Inhale 2 puffs into the lungs every 4 (four) hours as needed for wheezing or shortness of breath.   atorvastatin (LIPITOR) 40 MG tablet Take 1 tablet (40 mg total) by mouth daily.   Calcium Carbonate-Vitamin D (CALCIUM + D PO) Take 1 tablet by mouth at bedtime.    fentaNYL (DURAGESIC - DOSED MCG/HR) 25 MCG/HR Place 25 mcg onto the skin every 3 (three) days.    fluticasone (FLONASE) 50 MCG/ACT nasal spray Place 1 spray into both nostrils at bedtime as needed for allergies or rhinitis.    meclizine (ANTIVERT) 25 MG tablet Take 1 tablet (25 mg total) by mouth 3 (three) times daily as needed for dizziness.   Multiple Vitamin (MULTIVITAMIN WITH MINERALS) TABS tablet Take 1 tablet by mouth at bedtime.    Omega-3 Fatty Acids (FISH OIL) 500 MG CAPS Take 500 mg by mouth at bedtime.    PRESCRIPTION MEDICATION Inhale into the lungs at bedtime. CPAP   rivaroxaban (XARELTO) 20 MG TABS tablet TAKE 1 TABLET BY MOUTH ONCE DAILY WITH SUPPER   sodium  chloride (OCEAN) 0.65 % SOLN nasal spray Place 1 spray into both nostrils as needed for congestion.   traZODone (DESYREL) 50 MG tablet Take 100 mg by mouth at bedtime.   vortioxetine HBr (TRINTELLIX) 10 MG TABS tablet Take 1 tablet (10 mg total) by mouth at bedtime.   No current facility-administered medications on file prior to visit.     Allergies:   Adhesive [tape] and Other   Social History   Tobacco Use   Smoking status: Former    Packs/day: 1.00    Years: 19.00  Pack years: 19.00    Types: Cigarettes    Quit date: 08/04/1983    Years since quitting: 37.7   Smokeless tobacco: Never   Tobacco comments:    quit smoking 1985  Vaping Use   Vaping Use: Never used  Substance Use Topics   Alcohol use: Yes    Alcohol/week: 6.0 standard drinks    Types: 6 Cans of beer per week    Comment: maybe 2 or 3 beers per week   Drug use: No    Family History: The patient's family history includes Arthritis in his mother; Bone cancer in his father; Cancer in his father; Colon cancer in his father; Deep vein thrombosis in his mother; Diabetes in his father, paternal grandfather, and sister; Hearing loss in his maternal grandmother and mother; Hyperlipidemia in his mother; Hypertension in his mother; Prostate cancer in his father.  ROS:   (+) sweating  (+) back pain - on both sides  (+) chest pains   EKGs/Labs/Other Studies Reviewed:    The following studies were reviewed today: CT cardiac 05/25/19 FINDINGS: Coronary calcium score: The patient's coronary artery calcium score is 236, which places the patient in the 59 percentile.   Coronary arteries: Normal coronary origins.  Right dominance.   Right Coronary Artery: Dominant, large caliber vessel, gives rise to PDA and PLB. Proximal RCA has mixed calcified and noncalcified plaque with no more than 1-24% stenosis. Mid and distal RC has scattered mixed plaque but no significant stenosis.   Left Main Coronary Artery: Trivial  calcified plaque, no significant stenosis.   Left Anterior Descending Coronary Artery: Scattered diffuse mixed calcified and noncalcified plaque in proximal LAD, with no more than 1-24% stenosis. Gives rise to 1 small and 1 medium caliber diagonal branches. Mid to distal LAD without significant plaque or stenosis, but caliber becomes small after D2 branch. Distal LAD wraps LV apex.   Left Circumflex Artery: Proximal LCX has mixed calcified and noncalcified plaque with no more than 1-24% stenosis. Gives rise to 2 large caliber OM branches.   Aorta: Normal size, 34 mm at the mid ascending aorta (level of the PA bifurcation) measured double oblique. Mild calcifications. No dissection.   Aortic Valve: No calcifications. Trileaflet.   Other findings:   Normal pulmonary vein drainage into the left atrium.   Normal left atrial appendage without a thrombus.   Normal size of the pulmonary artery.   IMPRESSION: 1.  Scattered nonobstructive CAD, CADRADS = 1.   2. Coronary calcium score of 236. This was 59th percentile for age and sex matched control.   3. Normal coronary origin with right dominance.  Monitor 08/02/18 ~21 days of data recorded on Bi[employer] monitor. Patient had a min HR of 50 bpm, max HR of 181 bpm, and avg HR of 70 bpm. Predominant underlying rhythm was Sinus Rhythm. There was one episode of rapid atrial fibrillation on 08/06/2018 with a rate of 181 bpm. Total time in atrial fibrillation was 2 hrs 33 min, longest event was 13 minutes. AF occurred 38 time(s) with HR range of 75 - 181; Total AF Burden 1%   No VT, SVT, high degree block, or pauses noted. Isolated atrial and ventricular ectopy was rare (<1%). There were 16 symptomatic and 22 asymptomatic triggered events. All symptomatic events were either normal sinus rhythm, sinus tachycardia, or sinus rhythm with a PAC.  Echo 07/19/18  - Left ventricle: The cavity size was normal. Systolic function was   normal. The estimated  ejection fraction  was in the range of 60%   to 65%. Wall motion was normal; there were no regional wall   motion abnormalities. Features are consistent with a pseudonormal   left ventricular filling pattern, with concomitant abnormal   relaxation and increased filling pressure (grade 2 diastolic   dysfunction). There was no evidence of elevated ventricular   filling pressure by Doppler parameters. - Aortic root: The aortic root was normal in size. - Left atrium: The atrium was moderately dilated. - Right ventricle: The cavity size was normal. Wall thickness was   normal. Systolic function was normal. - Right atrium: The atrium was normal in size. - Tricuspid valve: There was mild regurgitation. - Pulmonary arteries: Systolic pressure was within the normal   range. - Inferior vena cava: The vessel was dilated. The respirophasic   diameter changes were blunted (< 50%), consistent with elevated   central venous pressure. - Pericardium, extracardiac: There was no pericardial effusion.   MPI 2007--I cannot see complete results  EKG:   04/08/2021: NSR at 63 bpm  08/25/18: sinus bradycardia at 55 bpm.  Recent Labs: 07/11/2020: ALT 28; BUN 16; Creat 1.12; Hemoglobin 15.8; Platelets 169; Potassium 4.4; Sodium 142; TSH 3.90  Recent Lipid Panel    Component Value Date/Time   CHOL 165 07/11/2020 0824   TRIG 74 07/11/2020 0824   HDL 52 07/11/2020 0824   CHOLHDL 3.2 07/11/2020 0824   VLDL 10 07/19/2018 0323   LDLCALC 97 07/11/2020 0824    Physical Exam:    VS:  BP 120/80 (BP Location: Left Arm, Patient Position: Sitting, Cuff Size: Normal)   Pulse 63   Ht 5' 7"  (1.702 m)   Wt 238 lb (108 kg)   BMI 37.28 kg/m     Wt Readings from Last 3 Encounters:  04/08/21 238 lb (108 kg)  01/31/21 233 lb 2 oz (105.7 kg)  07/16/20 235 lb (106.6 kg)    GEN: Well nourished, well developed in no acute distress HEENT: Normal, moist mucous membranes NECK: No JVD CARDIAC: regular rhythm, normal S1  and S2, no rubs or gallops. No murmur. VASCULAR: Radial and DP pulses 2+ bilaterally. No carotid bruits RESPIRATORY:  Clear to auscultation without rales, wheezing or rhonchi  ABDOMEN: Soft, non-tender, non-distended MUSCULOSKELETAL:  Ambulates independently SKIN: Warm and dry, no edema NEUROLOGIC:  Alert and oriented x 3. No focal neuro deficits noted. PSYCHIATRIC:  Normal affect   ASSESSMENT:    1. Paroxysmal atrial fibrillation (HCC)   2. Nonocclusive coronary atherosclerosis of native coronary artery   3. Pure hypercholesterolemia   4. Essential hypertension   5. Cardiac risk counseling   6. Counseling on health promotion and disease prevention     PLAN:    Nonobstructive CAD: based on results of CT cardiac for chest pain -on atorvastatin 40 mg daily -no aspirin as he is on rivaroxaban -counseled on red flag warning signs that need immediate medical attention  Atrial fibrillation, with RVR in hospital and spontaneous conversion: -CHA2DS2/VAS Stroke Risk Points=3 (HTN, HLD, age)  -on rivaroxaban for anticoagulation. Tolerating well. -regular rhythm today -asymptomatic -has instructions for PRN metoprolol   Hypertension: at goal of <130/80 on no medications -if elevates above goal in the future, did well on lisinopril, would restart  Hypercholesterolemia -LDL goal <70 with nonobstructive CAD -continue atorvastatin 40 mg -working on lifestyle, as below -lipids from 07/2020 reviewed. LDL 97, not at goal. Has been better controlled previously. Discussed meds, lifestyle. Recheck due with PCP soon  Lifestyle recommendations -  recommend heart healthy/Mediterranean diet, with whole grains, fruits, vegetable, fish, lean meats, nuts, and olive oil. Limit salt. -recommend moderate walking, 3-5 times/week for 30-50 minutes each session. Aim for at least 150 minutes.week. Goal should be pace of 3 miles/hours, or walking 1.5 miles in 30 minutes -recommend avoidance of tobacco  products. Avoid excess alcohol.  Plan for follow up:1 year  [name], MD, PhD [place]  [facility]   Medication Adjustments/Labs and Tests Ordered: Current medicines are reviewed at length with the patient today.  Concerns regarding medicines are outlined above.  Orders Placed This Encounter  Procedures   EKG 12-Lead    No orders of the defined types were placed in this encounter.   Patient Instructions  Medication Instructions:  Your Physician recommend you continue on your current medication as directed.    *If you need a refill on your cardiac medications before your next appointment, please call your pharmacy*   Lab Work: None ordered today   Testing/Procedures: None ordered today   Follow-Up: At [facility], you and your health needs are our priority.  As part of our continuing mission to provide you with exceptional heart care, we have created designated Provider Care Teams.  These Care Teams include your primary Cardiologist (physician) and Advanced Practice Providers (APPs -  Physician Assistants and Nurse Practitioners) who all work together to provide you with the care you need, when you need it.  We recommend signing up for the patient portal called "MyChart".  Sign up information is provided on this After Visit Summary.  MyChart is used to connect with patients for Virtual Visits (Telemedicine).  Patients are able to view lab/test results, encounter notes, upcoming appointments, etc.  Non-urgent messages can be sent to your provider as well.   To learn more about what you can do with MyChart, go to [email or web address].    Your next appointment:   1 year(s)  The format for your next appointment:   In Person  Provider:   [name], MD    Ardell Isaacs as a scribe for [name], MD.,have documented all relevant documentation on the behalf of [name], MD,as directed by  [name], MD while in the presence of [name], MD.  I, [name], MD, have reviewed all documentation for this visit. The documentation on 04/08/21 for the exam, diagnosis, procedures, and orders are all accurate and complete.  Signed, [name], MD PhD 04/08/2021  [facility]

## 2021-04-08 NOTE — Patient Instructions (Signed)
Medication Instructions:  Your Physician recommend you continue on your current medication as directed.    *If you need a refill on your cardiac medications before your next appointment, please call your pharmacy*   Lab Work: None ordered today   Testing/Procedures: None ordered today   Follow-Up: At [facility], you and your health needs are our priority.  As part of our continuing mission to provide you with exceptional heart care, we have created designated Provider Care Teams.  These Care Teams include your primary Cardiologist (physician) and Advanced Practice Providers (APPs -  Physician Assistants and Nurse Practitioners) who all work together to provide you with the care you need, when you need it.  We recommend signing up for the patient portal called "MyChart".  Sign up information is provided on this After Visit Summary.  MyChart is used to connect with patients for Virtual Visits (Telemedicine).  Patients are able to view lab/test results, encounter notes, upcoming appointments, etc.  Non-urgent messages can be sent to your provider as well.   To learn more about what you can do with MyChart, go to [email or web address].    Your next appointment:   1 year(s)  The format for your next appointment:   In Person  Provider:   [name], MD

## 2021-04-16 ENCOUNTER — Telehealth: Payer: Self-pay | Admitting: Pharmacist

## 2021-04-16 NOTE — Progress Notes (Addendum)
Chronic Care Management Pharmacy Assistant   Name: [name]  MRN: [id number] DOB: [birth year]  Reason for Encounter: Disease State For HTN.    Conditions to be addressed/monitored: HTN, HLD, Afib  Recent office visits:  None since 02/04/21  Recent consult visits:  04/08/21 Cardiology [name], MD. For Afib. No medication changes.   Hospital visits:  04/17/21 [facility], [place], [place]. For ear infection. STARTED Amoxicillin-Pot-Clavulanate 875-125 mg 1 tablet every 12 hours.  Medications: Outpatient Encounter Medications as of 04/16/2021  Medication Sig   albuterol (VENTOLIN HFA) 108 (90 Base) MCG/ACT inhaler Inhale 2 puffs into the lungs every 4 (four) hours as needed for wheezing or shortness of breath.   atorvastatin (LIPITOR) 40 MG tablet Take 1 tablet (40 mg total) by mouth daily.   Calcium Carbonate-Vitamin D (CALCIUM + D PO) Take 1 tablet by mouth at bedtime.    fentaNYL (DURAGESIC - DOSED MCG/HR) 25 MCG/HR Place 25 mcg onto the skin every 3 (three) days.    fluticasone (FLONASE) 50 MCG/ACT nasal spray Place 1 spray into both nostrils at bedtime as needed for allergies or rhinitis.    meclizine (ANTIVERT) 25 MG tablet Take 1 tablet (25 mg total) by mouth 3 (three) times daily as needed for dizziness.   Multiple Vitamin (MULTIVITAMIN WITH MINERALS) TABS tablet Take 1 tablet by mouth at bedtime.    Omega-3 Fatty Acids (FISH OIL) 500 MG CAPS Take 500 mg by mouth at bedtime.    PRESCRIPTION MEDICATION Inhale into the lungs at bedtime. CPAP   rivaroxaban (XARELTO) 20 MG TABS tablet TAKE 1 TABLET BY MOUTH ONCE DAILY WITH SUPPER   sodium chloride (OCEAN) 0.65 % SOLN nasal spray Place 1 spray into both nostrils as needed for congestion.   traZODone (DESYREL) 50 MG tablet Take 100 mg by mouth at bedtime.   vortioxetine HBr (TRINTELLIX) 10 MG TABS tablet Take 1 tablet (10 mg total) by mouth at bedtime.   No facility-administered encounter medications on file  as of 04/16/2021.   Reviewed chart prior to disease state call. Spoke with patient regarding BP  Recent Office Vitals: BP Readings from Last 3 Encounters:  04/08/21 120/80  01/31/21 (!) 120/58  07/16/20 110/60   Pulse Readings from Last 3 Encounters:  04/08/21 63  01/31/21 (!) 58  07/16/20 75    Wt Readings from Last 3 Encounters:  04/08/21 238 lb (108 kg)  01/31/21 233 lb 2 oz (105.7 kg)  07/16/20 235 lb (106.6 kg)     Kidney Function Lab Results  Component Value Date/Time   CREATININE 1.12 07/11/2020 08:24 AM   CREATININE 0.96 05/25/2019 08:54 AM   GFR 80.20 01/01/2016 10:04 AM   GFRNONAA 66 07/11/2020 08:24 AM   GFRAA 76 07/11/2020 08:24 AM    BMP Latest Ref Rng & Units 07/11/2020 05/25/2019 05/18/2019  Glucose 65 - 99 mg/dL 111(H) 109(H) 95  BUN 7 - 25 mg/dL 16 14 17   Creatinine 0.70 - 1.18 mg/dL 1.12 0.96 0.98  BUN/Creat Ratio 6 - 22 (calc) NOT APPLICABLE NOT APPLICABLE 17  Sodium 008 - 146 mmol/L 142 141 139  Potassium 3.5 - 5.3 mmol/L 4.4 4.6 4.0  Chloride 98 - 110 mmol/L 106 103 102  CO2 20 - 32 mmol/L 27 27 25   Calcium 8.6 - 10.3 mg/dL 9.9 9.6 9.6    Current antihypertensive regimen:  None.   How often are you checking your Blood Pressure? Patient stated when feeling symptomatic  Current home BP readings:  Patient was unable to give me any recent blood pressure readings.   What recent interventions/DTPs have been made by any provider to improve Blood Pressure control since last CPP Visit: None.   Any recent hospitalizations or ED visits since last visit with CPP? Patient stated he went to the Urgent Care in [place] for a ear infection.   What diet changes have been made to improve Blood Pressure Control?  Patient stated he continues to gain weight. We discussed ways to eat more healthier choices and encouraged him to drink more water.   What exercise is being done to improve your Blood Pressure Control?  Patient stated he is active. 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  Adherence Review: Is the patient currently on ACE/ARB medication? N/A.  Does the patient have >5 day gap between last estimated fill dates? Per misc rpts ,no.  Care Gaps: Patient is overdue for AWV.   Star Rating Drugs:Atorvastatin 40 mg 03/26/21 30 DS.  Follow-Up:Pharmacist Review  Charlann Lange, RMA Clinical Pharmacist Assistant [phone]  10 minutes spent in review, coordination, and documentation. Called to schedule AWV.  LVM for pt to call back. Reviewed by: Beverly Milch, PharmD Clinical Pharmacist [phone]

## 2021-04-17 ENCOUNTER — Ambulatory Visit
Admission: EM | Admit: 2021-04-17 | Discharge: 2021-04-17 | Disposition: A | Discharge status: Home / Self Care | Payer: Medicare Other | Attending: Emergency Medicine | Admitting: Emergency Medicine

## 2021-04-17 ENCOUNTER — Other Ambulatory Visit: Payer: Self-pay

## 2021-04-17 ENCOUNTER — Encounter: Payer: Self-pay | Admitting: Emergency Medicine

## 2021-04-17 DIAGNOSIS — H9221 Otorrhagia, right ear: Secondary | ICD-10-CM

## 2021-04-17 DIAGNOSIS — H66001 Acute suppurative otitis media without spontaneous rupture of ear drum, right ear: Secondary | ICD-10-CM | POA: Diagnosis not present

## 2021-04-17 MED ORDER — AMOXICILLIN-POT CLAVULANATE 875-125 MG PO TABS: 1.0000 | ORAL_TABLET | Freq: Two times a day (BID) | ORAL | 0 refills | Status: AC

## 2021-04-17 NOTE — Discharge Instructions (Signed)
Rest and drink plenty of fluids Prescribed augmentin Ear wick placed Take medications as directed and to completion Continue to use OTC ibuprofen and/ or tylenol as needed for pain control Follow up with PCP if symptoms persists Return here or go to the ER if you have any new or worsening symptoms

## 2021-04-17 NOTE — ED Provider Notes (Signed)
[facility]   [id number] 04/17/21 Arrival Time: 4431  CC:EAR PAIN  SUBJECTIVE: History from: patient.  [name] is a 70 y.o. male who presents with of RT ear pain x few days.  Denies a precipitating event, or trauma.  Patient states the pain is constant and achy in character.  Denies alleviating or aggravating factors.  Denies similar symptoms in the past.  Does mention some blood in the ear canal.  Denies fever, chills, fatigue, sinus pain, rhinorrhea, ear discharge, sore throat, SOB, wheezing, chest pain, nausea, changes in bowel or bladder habits.    ROS: As per HPI.  All other pertinent ROS negative.     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   Cataracts, both eyes    worst on left   Colon polyps    Complication of anesthesia    pt had spinal for a knee replacement--"woke up" during surgery--and sick after surgery   Depression    Diverticulosis    DVT (deep venous thrombosis) ([place]) 2001 or 2002   right leg-required hospitalization x 8 days   DVT (deep venous thrombosis) ([place]) 1996   right   Glaucoma    both eyes   H/O hiatal hernia    Hearing impaired person, bilateral    bilateral   Hemorrhoid    History of IBS    History of kidney stones    x 1-passed   Hyperlipidemia 2012   Hypertension    Lumbago    Lumbar post-laminectomy syndrome    PONV (postoperative nausea and vomiting)    Sleep apnea    pt uses cpap - setting of 16   Ulcer 1966   Ulcerative colitis ([place])    Wears glasses    Wears hearing aid    Past Surgical History:  Procedure Laterality Date   APPENDECTOMY  1993   BACK SURGERY  2008    bone  spur removed    bilateral carpal tunnel release 2009     cervical fusion 2011--pt has good range of motion neck     CHOLECYSTECTOMY  1994   EYE SURGERY N/A    [employer] 07/13/2020   INSERTION OF MESH Bilateral 07/31/2016   Procedure: INSERTION OF MESH;  Surgeon: [name], MD;  Location: [facility];  Service: General;  Laterality: Bilateral;   JOINT REPLACEMENT Bilateral    2001 left total knee and 1985 right total knee   MASS EXCISION Left 01/04/2014   Procedure: LEFT MIDDLE FINGER MASS EXCISION WITH NAILBED REPAIR AND ADVANCEMENT FLAP;  Surgeon: [name], MD;  Location: [facility];  Service: Orthopedics;  Laterality: Left;   mass removed from 2nd finger Left aug 2014   squamous carcinoma   nissen fundoplication [id number]     PROSTATE SURGERY  2014   TURP   right wrist fusion 12/12 - pt wearing brace on the wrist-not started physical therapy yet Right    fusion prevent limited ROM to right wrist   SPINE SURGERY  2009   cervical spine fusion   thyroid nodule     removed  TOTAL KNEE REVISION Right 11/17/2013   Procedure: RIGHT KNEE POLYETHYLENE REVISION, bone graft;  Surgeon: [name], MD;  [facility];  Service: Orthopedics;  Laterality: Right;   TOTAL SHOULDER ARTHROPLASTY Right 01/28/2018   Procedure: RIGHT TOTAL SHOULDER ARTHROPLASTY;  Surgeon: [name], MD;  Location: [facility];  Service: Orthopedics;  Laterality: Right;   TOTAL SHOULDER ARTHROPLASTY Left 07/15/2018   Procedure: LEFT SHOULDER ANATOMIC TOTAL SHOULDER ARTHROPLASTY;  Surgeon: [name], MD;  Location: [facility];  Service: Orthopedics;  Laterality: Left;   TRANSURETHRAL RESECTION OF PROSTATE  10/09/2011   Procedure: TRANSURETHRAL RESECTION OF THE PROSTATE WITH GYRUS INSTRUMENTS;  Surgeon: Fredricka Bonine, MD;  [facility];  Service: Urology;  Laterality: N/A;   uvvvp     XI ROBOTIC ASSISTED INGUINAL HERNIA REPAIR WITH MESH Bilateral 07/31/2016   Procedure: XI ROBOTIC BILATERAL  INGUINAL HERNIA REPAIR WITH MESH;  Surgeon: [name], MD;  [facility];  Service: General;  Laterality: Bilateral;   Allergies  Allergen Reactions   Adhesive [Tape] Other (See Comments)    Causes blisters - pls use paper tape   Other Other (See Comments)    "Adhesives"Pt allergic to some tapes--pt request that we only use paper tape!!   No current facility-administered medications on file prior to encounter.   Current Outpatient Medications on File Prior to Encounter  Medication Sig Dispense Refill   albuterol (VENTOLIN HFA) 108 (90 Base) MCG/ACT inhaler Inhale 2 puffs into the lungs every 4 (four) hours as needed for wheezing or shortness of breath. 18 g 2   atorvastatin (LIPITOR) 40 MG tablet Take 1 tablet (40 mg total) by mouth daily. 30 tablet 4   Calcium Carbonate-Vitamin D (CALCIUM + D PO) Take 1 tablet by mouth at bedtime.      fentaNYL (DURAGESIC - DOSED MCG/HR) 25 MCG/HR Place 25 mcg onto the skin every 3 (three) days.      fluticasone (FLONASE) 50 MCG/ACT nasal spray Place 1 spray into both nostrils at bedtime as needed for allergies or rhinitis.      meclizine (ANTIVERT) 25 MG tablet Take 1 tablet (25 mg total) by mouth 3 (three) times daily as needed for dizziness. 30 tablet 0   Multiple Vitamin (MULTIVITAMIN WITH MINERALS) TABS tablet Take 1 tablet by mouth at bedtime.      Omega-3 Fatty Acids (FISH OIL) 500 MG CAPS Take 500 mg by mouth at bedtime.      PRESCRIPTION MEDICATION Inhale into the lungs at bedtime. CPAP     rivaroxaban (XARELTO) 20 MG TABS tablet TAKE 1 TABLET BY MOUTH ONCE DAILY WITH SUPPER 30 tablet 11   sodium chloride (OCEAN) 0.65 % SOLN nasal spray Place 1 spray into both nostrils as needed for congestion. 88 mL 0   traZODone (DESYREL) 50 MG tablet Take 100 mg by mouth at bedtime.     vortioxetine HBr (TRINTELLIX) 10 MG TABS tablet Take 1 tablet (10 mg total) by mouth at bedtime. 30 tablet 5   Social History   Socioeconomic History   Marital  status: Married    Spouse name: Not on file   Number of children: Not on file   Years of education: Not on file   Highest education level: Not on file  Occupational History   Occupation: disabled  Tobacco Use   Smoking status: Former    Packs/day: 1.00    Years: 19.00    Pack years: 19.00    Types: Cigarettes    Quit  date: 08/04/1983    Years since quitting: 37.7   Smokeless tobacco: Never   Tobacco comments:    quit smoking 1985  Vaping Use   Vaping Use: Never used  Substance and Sexual Activity   Alcohol use: Yes    Alcohol/week: 6.0 standard drinks    Types: 6 Cans of beer per week    Comment: maybe 2 or 3 beers per week   Drug use: No   Sexual activity: Not Currently  Other Topics Concern   Not on file  Social History Narrative   Not on file   Social Determinants of Health   Financial Resource Strain: Low Risk    Difficulty of Paying Living Expenses: Not very hard  Food Insecurity: Not on file  Transportation Needs: Not on file  Physical Activity: Not on file  Stress: Not on file  Social Connections: Not on file  Intimate Partner Violence: Not on file   Family History  Problem Relation Age of Onset   Colon cancer Father    Bone cancer Father        deceased   Cancer Father    Diabetes Father    Prostate cancer Father    Arthritis Mother    Hearing loss Mother    Hyperlipidemia Mother    Hypertension Mother    Deep vein thrombosis Mother    Diabetes Sister    Hearing loss Maternal Grandmother    Diabetes Paternal Grandfather     OBJECTIVE:  Vitals:   04/17/21 1109  BP: 135/83  Pulse: 62  Resp: 18  Temp: 98.9 F (37.2 C)  TempSrc: Oral  SpO2: 93%     General appearance: alert; well-appearing, nontoxic; speaking in full sentences and tolerating own secretions HEENT: NCAT; Ears: LT EAC clear, LT TM pearly gray, RT EAC with blood, TM erythematous in lower quadrant; Eyes: PERRL.  EOM grossly intact.Nose: nares patent without rhinorrhea, Throat:  oropharynx clear, tonsils non erythematous or enlarged, uvula midline  Neck: supple without LAD Lungs: normal respiratory effort Skin: warm and dry Psychological: alert and cooperative; normal mood and affect    ASSESSMENT & PLAN:  1. Non-recurrent acute suppurative otitis media of right ear without spontaneous rupture of tympanic membrane   2. Blood in ear canal, right     Meds ordered this encounter  Medications   amoxicillin-clavulanate (AUGMENTIN) 875-125 MG tablet    Sig: Take 1 tablet by mouth every 12 (twelve) hours for 10 days.    Dispense:  20 tablet    Refill:  0    Order Specific Question:   Supervising Provider    Answer:   Raylene Everts [[id number]]    Rest and drink plenty of fluids Prescribed augmentin Ear wick placed Take medications as directed and to completion Continue to use OTC ibuprofen and/ or tylenol as needed for pain control Follow up with PCP if symptoms persists Return here or go to the ER if you have any new or worsening symptoms   Reviewed expectations re: course of current medical issues. Questions answered. Outlined signs and symptoms indicating need for more acute intervention. Patient verbalized understanding. After Visit Summary given.          Lestine Box, PA-C 04/17/21 1138

## 2021-04-17 NOTE — ED Triage Notes (Signed)
RT ear buzzing, pain and has seen some bloody drainage

## 2021-04-23 ENCOUNTER — Other Ambulatory Visit: Payer: Self-pay | Admitting: Family Medicine

## 2021-04-24 ENCOUNTER — Encounter: Payer: Self-pay | Admitting: Family Medicine

## 2021-04-24 ENCOUNTER — Ambulatory Visit (INDEPENDENT_AMBULATORY_CARE_PROVIDER_SITE_OTHER): Payer: Medicare Other | Admitting: Family Medicine

## 2021-04-24 ENCOUNTER — Other Ambulatory Visit: Payer: Self-pay

## 2021-04-24 VITALS — BP 138/78 | HR 74 | Temp 98.7°F | Resp 18 | Ht 67.0 in | Wt 236.0 lb

## 2021-04-24 DIAGNOSIS — I2510 Atherosclerotic heart disease of native coronary artery without angina pectoris: Secondary | ICD-10-CM | POA: Diagnosis not present

## 2021-04-24 DIAGNOSIS — J329 Chronic sinusitis, unspecified: Secondary | ICD-10-CM | POA: Diagnosis not present

## 2021-04-24 DIAGNOSIS — J310 Chronic rhinitis: Secondary | ICD-10-CM | POA: Diagnosis not present

## 2021-04-24 DIAGNOSIS — Z23 Encounter for immunization: Secondary | ICD-10-CM

## 2021-04-24 MED ORDER — LEVOCETIRIZINE DIHYDROCHLORIDE 5 MG PO TABS: 5.0000 mg | ORAL_TABLET | Freq: Every evening | ORAL | 0 refills | Status: DC

## 2021-04-24 NOTE — Progress Notes (Signed)
Subjective:    Patient ID: [name], male    DOB: [birth year], 70 y.o.   MRN: [id number] Patient went to an urgent care last Thursday due to pressure in his right ear.  He is also seeing blood for 2 days straight on a Q-tip.  He also had decreased hearing in his right ear.  He was diagnosed with an ear infection and was placed on Augmentin.  He has been on the Augmentin for 7 days.  He states that he is still having pressure in his right ear and decreased hearing.  He also states that his head is stopped up.  He reports congestion and pain and pressure in his sinuses and in his forehead.  He reports runny nose and sneezing.  He has been dealing with allergies especially when he is mowing his yard.  On examination today, there is a scratch with a bloody scab on the floor of the auditory canal about 1 inch inside.  There is no trauma or damage to the tympanic membrane.  Is pearly gray.  There is no visible effusion.  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   Cataracts, both eyes    worst on left   Colon polyps    Complication of anesthesia    pt had spinal for a knee replacement--"woke up" during surgery--and sick after surgery   Depression    Diverticulosis    DVT (deep venous thrombosis) ([place]) 2001 or 2002   right leg-required hospitalization x 8 days   DVT (deep venous thrombosis) ([place]) 1996   right   Glaucoma    both eyes   H/O hiatal hernia    Hearing impaired person, bilateral    bilateral   Hemorrhoid    History of IBS    History of kidney stones     x 1-passed   Hyperlipidemia 2012   Hypertension    Lumbago    Lumbar post-laminectomy syndrome    PONV (postoperative nausea and vomiting)    Sleep apnea    pt uses cpap - setting of 16   Ulcer 1966   Ulcerative colitis ([place])    Wears glasses    Wears hearing aid    Past Surgical History:  Procedure Laterality Date   APPENDECTOMY  1993   BACK SURGERY  2008    bone spur removed    bilateral carpal tunnel release 2009     cervical fusion 2011--pt has good range of motion neck     CHOLECYSTECTOMY  1994   EYE SURGERY N/A    [employer] 07/13/2020   INSERTION OF MESH Bilateral 07/31/2016   Procedure: INSERTION OF MESH;  Surgeon: [name], MD;  [facility];  Service: General;  Laterality: Bilateral;   JOINT REPLACEMENT Bilateral    2001 left total knee and 1985 right total knee   MASS EXCISION Left 01/04/2014   Procedure: LEFT MIDDLE FINGER MASS EXCISION WITH NAILBED REPAIR AND ADVANCEMENT FLAP;  Surgeon: [name], MD;  Location: [facility];  Service: Orthopedics;  Laterality: Left;   mass removed from 2nd finger Left aug 2014   squamous carcinoma   nissen fundoplication [id number]     PROSTATE SURGERY  2014   TURP   right wrist fusion 12/12 - pt wearing brace on the wrist-not started physical therapy yet Right    fusion prevent limited ROM to right wrist   SPINE SURGERY  2009   cervical spine fusion   thyroid nodule     removed   TOTAL KNEE REVISION Right 11/17/2013   Procedure: RIGHT KNEE POLYETHYLENE REVISION, bone graft;  Surgeon: [name], MD;  [facility];  Service: Orthopedics;  Laterality: Right;   TOTAL SHOULDER ARTHROPLASTY Right 01/28/2018   Procedure: RIGHT TOTAL SHOULDER ARTHROPLASTY;  Surgeon: [name], MD;  Location: [facility];  Service: Orthopedics;  Laterality: Right;   TOTAL SHOULDER ARTHROPLASTY Left 07/15/2018   Procedure: LEFT SHOULDER ANATOMIC TOTAL SHOULDER ARTHROPLASTY;  Surgeon: [name], MD;  Location: [facility];   Service: Orthopedics;  Laterality: Left;   TRANSURETHRAL RESECTION OF PROSTATE  10/09/2011   Procedure: TRANSURETHRAL RESECTION OF THE PROSTATE WITH GYRUS INSTRUMENTS;  Surgeon: Fredricka Bonine, MD;  [facility];  Service: Urology;  Laterality: N/A;   uvvvp     XI ROBOTIC ASSISTED INGUINAL HERNIA REPAIR WITH MESH Bilateral 07/31/2016   Procedure: XI ROBOTIC BILATERAL INGUINAL HERNIA REPAIR WITH MESH;  Surgeon: [name], MD;  [facility];  Service: General;  Laterality: Bilateral;   Current Outpatient Medications on File Prior to Visit  Medication Sig Dispense Refill   albuterol (VENTOLIN HFA) 108 (90 Base) MCG/ACT inhaler Inhale 2 puffs into the lungs every 4 (four) hours as needed for wheezing or shortness of breath. 18 g 2   amoxicillin-clavulanate (AUGMENTIN) 875-125 MG tablet Take 1 tablet by mouth every 12 (twelve) hours for 10 days. 20 tablet 0   atorvastatin (LIPITOR) 40 MG tablet Take 1 tablet by mouth once daily 30 tablet 0   Calcium Carbonate-Vitamin D (CALCIUM + D PO) Take 1 tablet by mouth at bedtime.      fentaNYL (DURAGESIC - DOSED MCG/HR) 25 MCG/HR Place 25 mcg onto the skin every 3 (three) days.      fluticasone (FLONASE) 50 MCG/ACT nasal spray Place 1 spray into both nostrils at bedtime as needed for allergies or rhinitis.      meclizine (ANTIVERT) 25 MG tablet Take 1 tablet (25 mg total) by mouth 3 (three) times daily as needed for dizziness. 30 tablet 0   Multiple Vitamin (MULTIVITAMIN WITH MINERALS) TABS tablet Take 1 tablet by mouth at bedtime.      Omega-3 Fatty Acids (FISH OIL) 500 MG CAPS Take 500 mg by mouth at bedtime.      PRESCRIPTION MEDICATION Inhale into the lungs at bedtime. CPAP     rivaroxaban (XARELTO) 20 MG TABS tablet TAKE 1 TABLET BY MOUTH ONCE DAILY WITH SUPPER 30 tablet 11   sodium chloride (OCEAN) 0.65 % SOLN nasal spray Place 1 spray into both nostrils as needed for congestion. 88 mL 0   traZODone (DESYREL) 50 MG tablet Take 100 mg  by mouth at bedtime.     vortioxetine HBr (TRINTELLIX) 10 MG TABS tablet Take 1 tablet (10 mg total) by mouth at bedtime. 30 tablet 5   No current facility-administered medications on file prior to visit.   Allergies  Allergen Reactions   Adhesive [Tape] Other (See Comments)    Causes blisters - pls  use paper tape   Other Other (See Comments)    "Adhesives"Pt allergic to some tapes--pt request that we only use paper tape!!   Social History   Socioeconomic History   Marital status: Married    Spouse name: Not on file   Number of children: Not on file   Years of education: Not on file   Highest education level: Not on file  Occupational History   Occupation: disabled  Tobacco Use   Smoking status: Former    Packs/day: 1.00    Years: 19.00    Pack years: 19.00    Types: Cigarettes    Quit date: 08/04/1983    Years since quitting: 37.7   Smokeless tobacco: Never   Tobacco comments:    quit smoking 1985  Vaping Use   Vaping Use: Never used  Substance and Sexual Activity   Alcohol use: Yes    Alcohol/week: 6.0 standard drinks    Types: 6 Cans of beer per week    Comment: maybe 2 or 3 beers per week   Drug use: No   Sexual activity: Not Currently  Other Topics Concern   Not on file  Social History Narrative   Not on file   Social Determinants of Health   Financial Resource Strain: Low Risk    Difficulty of Paying Living Expenses: Not very hard  Food Insecurity: Not on file  Transportation Needs: Not on file  Physical Activity: Not on file  Stress: Not on file  Social Connections: Not on file  Intimate Partner Violence: Not on file   Family History  Problem Relation Age of Onset   Colon cancer Father    Bone cancer Father        deceased   Cancer Father    Diabetes Father    Prostate cancer Father    Arthritis Mother    Hearing loss Mother    Hyperlipidemia Mother    Hypertension Mother    Deep vein thrombosis Mother    Diabetes Sister    Hearing loss  Maternal Grandmother    Diabetes Paternal Grandfather      Review of Systems  Neurological:  Positive for dizziness.  All other systems reviewed and are negative.     Objective:   Physical Exam Vitals reviewed.  Constitutional:      General: He is not in acute distress.    Appearance: He is well-developed. He is not diaphoretic.  HENT:     Head: Normocephalic and atraumatic.     Right Ear: Tympanic membrane and external ear normal.     Left Ear: Tympanic membrane, ear canal and external ear normal.     Nose: Congestion and rhinorrhea present.     Mouth/Throat:     Pharynx: No oropharyngeal exudate.  Eyes:     General: No scleral icterus.       Right eye: No discharge.        Left eye: No discharge.     Conjunctiva/sclera: Conjunctivae normal.     Pupils: Pupils are equal, round, and reactive to light.  Neck:     Thyroid: No thyromegaly.     Vascular: No JVD.     Trachea: No tracheal deviation.  Cardiovascular:     Rate and Rhythm: Normal rate and regular rhythm.     Heart sounds: Normal heart sounds. No murmur heard.   No friction rub. No gallop.  Pulmonary:     Effort: Pulmonary effort is normal. No respiratory distress.  Breath sounds: No stridor. No wheezing, rhonchi or rales.  Chest:     Chest wall: No tenderness.  Abdominal:     General: Bowel sounds are normal. There is no distension.     Palpations: Abdomen is soft. There is no mass.     Tenderness: There is no abdominal tenderness. There is no guarding or rebound.  Musculoskeletal:        General: No tenderness or deformity. Normal range of motion.     Cervical back: Normal range of motion and neck supple.  Lymphadenopathy:     Cervical: No cervical adenopathy.  Skin:    General: Skin is warm.     Coloration: Skin is not pale.     Findings: No erythema or rash.  Neurological:     Mental Status: He is alert and oriented to person, place, and time.     Cranial Nerves: No cranial nerve deficit.      Motor: No abnormal muscle tone.     Coordination: Coordination normal.     Deep Tendon Reflexes: Reflexes are normal and symmetric.  Psychiatric:        Behavior: Behavior normal.        Thought Content: Thought content normal.        Judgment: Judgment normal.          Assessment & Plan:  Rhinosinusitis I believe the patient is having eustachian tube dysfunction secondary to rhinosinusitis.  Complete the Augmentin but add Flonase 2 sprays each nostril daily which she has not been doing regularly and start Xyzal 5 mg daily.  Reassess in 1 week if no better or sooner if worse.  He also received his flu shot today

## 2021-05-22 ENCOUNTER — Other Ambulatory Visit: Payer: Self-pay | Admitting: Family Medicine

## 2021-05-23 DIAGNOSIS — M5459 Other low back pain: Secondary | ICD-10-CM | POA: Diagnosis not present

## 2021-05-23 DIAGNOSIS — M5451 Vertebrogenic low back pain: Secondary | ICD-10-CM | POA: Diagnosis not present

## 2021-05-27 DIAGNOSIS — G894 Chronic pain syndrome: Secondary | ICD-10-CM | POA: Diagnosis not present

## 2021-05-28 ENCOUNTER — Encounter: Payer: Self-pay | Admitting: *Deleted

## 2021-05-28 ENCOUNTER — Other Ambulatory Visit: Payer: Self-pay

## 2021-05-28 ENCOUNTER — Ambulatory Visit
Admission: EM | Admit: 2021-05-28 | Discharge: 2021-05-28 | Disposition: A | Discharge status: Home / Self Care | Payer: Medicare Other | Attending: Family Medicine | Admitting: Family Medicine

## 2021-05-28 DIAGNOSIS — M109 Gout, unspecified: Secondary | ICD-10-CM | POA: Diagnosis not present

## 2021-05-28 MED ORDER — COLCHICINE 0.6 MG PO TABS: ORAL_TABLET | ORAL | 0 refills | Status: DC

## 2021-05-28 NOTE — ED Triage Notes (Signed)
Pt 's nail on Lt great toe changed colors and is black. Pt reports pain to toe . Pt denies any injury to toe.

## 2021-05-29 NOTE — ED Provider Notes (Signed)
[name]   [id number] 05/28/21 Arrival Time: 1708  ASSESSMENT & PLAN:  1. Podagra    Discussed typical duration of viral illnesses. No signs of infection. Will tx as gout. Has podiatry f/u to discuss toenail fungus  Meds ordered this encounter  Medications   colchicine 0.6 MG tablet    Sig: Take two tablets as one dose followed one hour later by one tablet.    Dispense:  3 tablet    Refill:  0     Follow-up Information     [name], MD.   Specialty: Family Medicine Why: As needed. Contact information: [address] [place] [address] [phone]                 Reviewed expectations re: course of current medical issues. Questions answered. Outlined signs and symptoms indicating need for more acute intervention. Understanding verbalized. After Visit Summary given.   SUBJECTIVE: History from: patient. [name] is a 70 y.o. male who reports L great toe pain; abrupt onset; yesterday. No swelling. No injury. Denies: fever. Normal PO intake without n/v/d.   OBJECTIVE:  Vitals:   05/28/21 1812  BP: (!) 163/86  Pulse: 68  Resp: 20  Temp: 98.7 F (37.1 C)  SpO2: 91%    General appearance: alert; no distress Extremities: no edema; mild swelling of L great toe at MTP; no erythema Skin: warm and dry Neurologic: normal gait Psychological: alert and cooperative; normal mood and affect   Allergies  Allergen Reactions   Adhesive [Tape] Other (See Comments)    Causes blisters - pls use paper tape   Other Other (See Comments)    "Adhesives"Pt allergic to some tapes--pt request that we only use paper tape!!    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   Cataracts, both eyes    worst on left   Colon polyps    Complication of anesthesia    pt had spinal for a knee replacement--"woke up" during surgery--and sick after surgery   Depression    Diverticulosis    DVT (deep venous thrombosis) ([place]) 2001 or 2002   right leg-required hospitalization x 8 days   DVT (deep venous thrombosis) ([place]) 1996   right   Glaucoma    both eyes   H/O hiatal hernia    Hearing impaired person, bilateral    bilateral   Hemorrhoid    History of IBS    History of kidney stones    x 1-passed   Hyperlipidemia 2012   Hypertension    Lumbago    Lumbar post-laminectomy syndrome    PONV (postoperative nausea and vomiting)    Sleep apnea    pt uses cpap - setting of 16   Ulcer 1966   Ulcerative colitis ([place])    Wears glasses    Wears hearing aid    Social History   Socioeconomic History   Marital status: Married    Spouse name: Not on file   Number of children: Not on file   Years of education: Not on file  Highest education level: Not on file  Occupational History   Occupation: disabled  Tobacco Use   Smoking status: Former    Packs/day: 1.00    Years: 19.00    Pack years: 19.00    Types: Cigarettes    Quit date: 08/04/1983    Years since quitting: 37.8   Smokeless tobacco: Never   Tobacco comments:    quit smoking 1985  Vaping Use   Vaping Use: Never used  Substance and Sexual Activity   Alcohol use: Yes    Alcohol/week: 6.0 standard drinks    Types: 6 Cans of beer per week    Comment: maybe 2 or 3 beers per week   Drug use: No   Sexual activity: Not Currently  Other Topics Concern   Not on file  Social History Narrative   Not on file   Social Determinants of Health   Financial Resource Strain: Low Risk    Difficulty of Paying Living Expenses: Not very hard  Food Insecurity: Not on file  Transportation Needs: Not on file  Physical Activity: Not on file   Stress: Not on file  Social Connections: Not on file  Intimate Partner Violence: Not on file   Family History  Problem Relation Age of Onset   Colon cancer Father    Bone cancer Father        deceased   Cancer Father    Diabetes Father    Prostate cancer Father    Arthritis Mother    Hearing loss Mother    Hyperlipidemia Mother    Hypertension Mother    Deep vein thrombosis Mother    Diabetes Sister    Hearing loss Maternal Grandmother    Diabetes Paternal Grandfather    Past Surgical History:  Procedure Laterality Date   APPENDECTOMY  1993   BACK SURGERY  2008    bone spur removed    bilateral carpal tunnel release 2009     cervical fusion 2011--pt has good range of motion neck     CHOLECYSTECTOMY  1994   EYE SURGERY N/A    Phreesia 07/13/2020   INSERTION OF MESH Bilateral 07/31/2016   Procedure: INSERTION OF MESH;  Surgeon: [name], MD;  Location: [facility];  Service: General;  Laterality: Bilateral;   JOINT REPLACEMENT Bilateral    2001 left total knee and 1985 right total knee   MASS EXCISION Left 01/04/2014   Procedure: LEFT MIDDLE FINGER MASS EXCISION WITH NAILBED REPAIR AND ADVANCEMENT FLAP;  Surgeon: [name], MD;  Location: [facility];  Service: Orthopedics;  Laterality: Left;   mass removed from 2nd finger Left aug 2014   squamous carcinoma   nissen fundoplication [id number]     PROSTATE SURGERY  2014   TURP   right wrist fusion 12/12 - pt wearing brace on the wrist-not started physical therapy yet Right    fusion prevent limited ROM to right wrist   SPINE SURGERY  2009   cervical spine fusion   thyroid nodule     removed   TOTAL KNEE REVISION Right 11/17/2013   Procedure: RIGHT KNEE POLYETHYLENE REVISION, bone graft;  Surgeon: [name], MD;  Location: [facility];  Service: Orthopedics;  Laterality: Right;   TOTAL SHOULDER ARTHROPLASTY Right 01/28/2018   Procedure: RIGHT TOTAL SHOULDER ARTHROPLASTY;  Surgeon: [name], MD;   Location: [facility];  Service: Orthopedics;  Laterality: Right;   TOTAL SHOULDER ARTHROPLASTY Left 07/15/2018   Procedure: LEFT SHOULDER ANATOMIC TOTAL SHOULDER ARTHROPLASTY;  Surgeon: [name], MD;  Location: [facility];  Service: Orthopedics;  Laterality: Left;   TRANSURETHRAL RESECTION OF PROSTATE  10/09/2011   Procedure: TRANSURETHRAL RESECTION OF THE PROSTATE WITH GYRUS INSTRUMENTS;  Surgeon: Fredricka Bonine, MD;  Location[facility];  Service: Urology;  Laterality: N/A;   uvvvp     XI ROBOTIC ASSISTED INGUINAL HERNIA REPAIR WITH MESH Bilateral 07/31/2016   Procedure: XI ROBOTIC BILATERAL INGUINAL HERNIA REPAIR WITH MESH;  Surgeon: [name], MD;  [facility];  Service: General;  Laterality: Jilda Panda, MD 05/29/21 1157

## 2021-05-30 ENCOUNTER — Other Ambulatory Visit: Payer: Self-pay | Admitting: Cardiology

## 2021-06-02 DIAGNOSIS — M79675 Pain in left toe(s): Secondary | ICD-10-CM | POA: Diagnosis not present

## 2021-06-02 DIAGNOSIS — S90212A Contusion of left great toe with damage to nail, initial encounter: Secondary | ICD-10-CM | POA: Diagnosis not present

## 2021-06-02 NOTE — Telephone Encounter (Signed)
Prescription refill request for Xarelto received.  Indication:afib Last office visit:christopher 04/08/21 Weight:107kg Age:77mScr: 1.12 07/11/20 CrCl:90.2

## 2021-06-05 ENCOUNTER — Other Ambulatory Visit: Payer: Self-pay | Admitting: Family Medicine

## 2021-06-06 ENCOUNTER — Other Ambulatory Visit: Payer: Self-pay

## 2021-06-11 DIAGNOSIS — M5459 Other low back pain: Secondary | ICD-10-CM | POA: Diagnosis not present

## 2021-06-11 DIAGNOSIS — M5451 Vertebrogenic low back pain: Secondary | ICD-10-CM | POA: Diagnosis not present

## 2021-06-13 ENCOUNTER — Other Ambulatory Visit: Payer: Self-pay

## 2021-06-13 ENCOUNTER — Ambulatory Visit (INDEPENDENT_AMBULATORY_CARE_PROVIDER_SITE_OTHER): Payer: Medicare Other | Admitting: Family Medicine

## 2021-06-13 ENCOUNTER — Telehealth (HOSPITAL_BASED_OUTPATIENT_CLINIC_OR_DEPARTMENT_OTHER): Payer: Self-pay | Admitting: *Deleted

## 2021-06-13 VITALS — BP 130/76 | HR 80 | Temp 99.0°F | Resp 18 | Ht 67.0 in | Wt 232.0 lb

## 2021-06-13 DIAGNOSIS — R7309 Other abnormal glucose: Secondary | ICD-10-CM | POA: Diagnosis not present

## 2021-06-13 DIAGNOSIS — I2510 Atherosclerotic heart disease of native coronary artery without angina pectoris: Secondary | ICD-10-CM | POA: Diagnosis not present

## 2021-06-13 DIAGNOSIS — R509 Fever, unspecified: Secondary | ICD-10-CM | POA: Diagnosis not present

## 2021-06-13 DIAGNOSIS — M5451 Vertebrogenic low back pain: Secondary | ICD-10-CM | POA: Diagnosis not present

## 2021-06-13 MED ORDER — AMOXICILLIN-POT CLAVULANATE 875-125 MG PO TABS: 1.0000 | ORAL_TABLET | Freq: Two times a day (BID) | ORAL | 0 refills | Status: DC

## 2021-06-13 NOTE — Progress Notes (Signed)
Subjective:    Patient ID: [name], male    DOB: March 05, 1951, 70 y.o.   MRN: [id number] Patient states that Wednesday or Thursday last week he started developing sweating episodes as well as low-grade fevers and chills.  He states that his temperature has been 99.2.  He denies any cough or sore throat or otalgia or rhinorrhea.  He is having a dull headache.  He denies any neck stiffness.  However today he developed crampy abdominal pain.  He also developed 2 episodes of mushy diarrhea but no hematochezia.  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   Cataracts, both eyes    worst on left   Colon polyps    Complication of anesthesia    pt had spinal for a knee replacement--"woke up" during surgery--and sick after surgery   Depression    Diverticulosis    DVT (deep venous thrombosis) ([facility]) 2001 or 2002   right leg-required hospitalization x 8 days   DVT (deep venous thrombosis) ([facility]) 1996   right   Glaucoma    both eyes   H/O hiatal hernia    Hearing impaired person, bilateral    bilateral   Hemorrhoid    History of IBS    History of kidney stones    x 1-passed   Hyperlipidemia 2012   Hypertension    Lumbago    Lumbar post-laminectomy syndrome    PONV (postoperative nausea and vomiting)    Sleep apnea    pt uses cpap - setting of 16   Ulcer 1966   Ulcerative colitis ([facility])    Wears glasses    Wears hearing aid    Past Surgical History:  Procedure Laterality Date   APPENDECTOMY  1993   BACK SURGERY  2008    bone spur removed    bilateral carpal tunnel release 2009      cervical fusion 2011--pt has good range of motion neck     CHOLECYSTECTOMY  1994   EYE SURGERY N/A    Phreesia 07/13/2020   INSERTION OF MESH Bilateral 07/31/2016   Procedure: INSERTION OF MESH;  Surgeon: [name], MD;  [facility];  Service: General;  Laterality: Bilateral;   JOINT REPLACEMENT Bilateral    2001 left total knee and 1985 right total knee   MASS EXCISION Left 01/04/2014   Procedure: LEFT MIDDLE FINGER MASS EXCISION WITH NAILBED REPAIR AND ADVANCEMENT FLAP;  Surgeon: [name], MD;  Location: [facility];  Service: Orthopedics;  Laterality: Left;   mass removed from 2nd finger Left aug 2014   squamous carcinoma   nissen fundoplication [id number]     PROSTATE SURGERY  2014   TURP   right wrist fusion 12/12 - pt wearing brace on the wrist-not started physical therapy yet Right    fusion prevent limited ROM to right wrist   SPINE SURGERY  2009   cervical spine fusion   thyroid nodule     removed   TOTAL KNEE REVISION Right 11/17/2013  Procedure: RIGHT KNEE POLYETHYLENE REVISION, bone graft;  Surgeon: [name], MD;  [facility];  Service: Orthopedics;  Laterality: Right;   TOTAL SHOULDER ARTHROPLASTY Right 01/28/2018   Procedure: RIGHT TOTAL SHOULDER ARTHROPLASTY;  Surgeon: [name], MD;  Location: [facility];  Service: Orthopedics;  Laterality: Right;   TOTAL SHOULDER ARTHROPLASTY Left 07/15/2018   Procedure: LEFT SHOULDER ANATOMIC TOTAL SHOULDER ARTHROPLASTY;  Surgeon: [name], MD;  Location: [facility];  Service: Orthopedics;  Laterality: Left;   TRANSURETHRAL RESECTION OF PROSTATE  10/09/2011   Procedure: TRANSURETHRAL RESECTION OF THE PROSTATE WITH GYRUS INSTRUMENTS;  Surgeon: Fredricka Bonine, MD;  [facility];  Service: Urology;  Laterality: N/A;   uvvvp     XI ROBOTIC ASSISTED INGUINAL HERNIA REPAIR WITH MESH Bilateral 07/31/2016   Procedure: XI ROBOTIC BILATERAL INGUINAL HERNIA REPAIR WITH MESH;  Surgeon: [name],  MD;  [facility];  Service: General;  Laterality: Bilateral;   Current Outpatient Medications on File Prior to Visit  Medication Sig Dispense Refill   albuterol (VENTOLIN HFA) 108 (90 Base) MCG/ACT inhaler Inhale 2 puffs into the lungs every 4 (four) hours as needed for wheezing or shortness of breath. 18 g 2   atorvastatin (LIPITOR) 40 MG tablet Take 1 tablet by mouth once daily 30 tablet 0   Calcium Carbonate-Vitamin D (CALCIUM + D PO) Take 1 tablet by mouth at bedtime.      colchicine 0.6 MG tablet Take two tablets as one dose followed one hour later by one tablet. 3 tablet 0   fentaNYL (DURAGESIC - DOSED MCG/HR) 25 MCG/HR Place 25 mcg onto the skin every 3 (three) days.      fluticasone (FLONASE) 50 MCG/ACT nasal spray Place 1 spray into both nostrils at bedtime as needed for allergies or rhinitis.      levocetirizine (XYZAL) 5 MG tablet TAKE 1 TABLET BY MOUTH ONCE DAILY IN THE EVENING 90 tablet 2   meclizine (ANTIVERT) 25 MG tablet Take 1 tablet (25 mg total) by mouth 3 (three) times daily as needed for dizziness. 30 tablet 0   Multiple Vitamin (MULTIVITAMIN WITH MINERALS) TABS tablet Take 1 tablet by mouth at bedtime.      Omega-3 Fatty Acids (FISH OIL) 500 MG CAPS Take 500 mg by mouth at bedtime.      PRESCRIPTION MEDICATION Inhale into the lungs at bedtime. CPAP     rivaroxaban (XARELTO) 20 MG TABS tablet TAKE 1 TABLET BY MOUTH ONCE DAILY WITH SUPPER 90 tablet 1   sodium chloride (OCEAN) 0.65 % SOLN nasal spray Place 1 spray into both nostrils as needed for congestion. 88 mL 0   SSD 1 % cream Apply topically daily.     traZODone (DESYREL) 50 MG tablet Take 100 mg by mouth at bedtime.     vortioxetine HBr (TRINTELLIX) 10 MG TABS tablet Take 1 tablet (10 mg total) by mouth at bedtime. 30 tablet 5   No current facility-administered medications on file prior to visit.   Allergies  Allergen Reactions   Adhesive [Tape] Other (See Comments)    Causes blisters - pls use paper tape    Other Other (See Comments)    "Adhesives"Pt allergic to some tapes--pt request that we only use paper tape!!   Social History   Socioeconomic History   Marital status: Married    Spouse name: Not on file   Number of children: Not on file   Years of education: Not on file   Highest education  level: Not on file  Occupational History   Occupation: disabled  Tobacco Use   Smoking status: Former    Packs/day: 1.00    Years: 19.00    Pack years: 19.00    Types: Cigarettes    Quit date: 08/04/1983    Years since quitting: 37.8   Smokeless tobacco: Never   Tobacco comments:    quit smoking 1985  Vaping Use   Vaping Use: Never used  Substance and Sexual Activity   Alcohol use: Yes    Alcohol/week: 6.0 standard drinks    Types: 6 Cans of beer per week    Comment: maybe 2 or 3 beers per week   Drug use: No   Sexual activity: Not Currently  Other Topics Concern   Not on file  Social History Narrative   Not on file   Social Determinants of Health   Financial Resource Strain: Low Risk    Difficulty of Paying Living Expenses: Not very hard  Food Insecurity: Not on file  Transportation Needs: Not on file  Physical Activity: Not on file  Stress: Not on file  Social Connections: Not on file  Intimate Partner Violence: Not on file   Family History  Problem Relation Age of Onset   Colon cancer Father    Bone cancer Father        deceased   Cancer Father    Diabetes Father    Prostate cancer Father    Arthritis Mother    Hearing loss Mother    Hyperlipidemia Mother    Hypertension Mother    Deep vein thrombosis Mother    Diabetes Sister    Hearing loss Maternal Grandmother    Diabetes Paternal Grandfather      Review of Systems  Neurological:  Positive for dizziness.  All other systems reviewed and are negative.     Objective:   Physical Exam Vitals reviewed.  Constitutional:      General: He is not in acute distress.    Appearance: He is well-developed. He is  not diaphoretic.  HENT:     Head: Normocephalic and atraumatic.     Right Ear: Tympanic membrane and external ear normal.     Left Ear: Tympanic membrane, ear canal and external ear normal.     Mouth/Throat:     Pharynx: No oropharyngeal exudate.  Eyes:     General: No scleral icterus.       Right eye: No discharge.        Left eye: No discharge.     Conjunctiva/sclera: Conjunctivae normal.     Pupils: Pupils are equal, round, and reactive to light.  Neck:     Thyroid: No thyromegaly.     Vascular: No JVD.     Trachea: No tracheal deviation.  Cardiovascular:     Rate and Rhythm: Normal rate and regular rhythm.     Heart sounds: Normal heart sounds. No murmur heard.   No friction rub. No gallop.  Pulmonary:     Effort: Pulmonary effort is normal. No respiratory distress.     Breath sounds: No stridor. No wheezing, rhonchi or rales.  Chest:     Chest wall: No tenderness.  Abdominal:     General: Bowel sounds are normal. There is no distension.     Palpations: Abdomen is soft. There is no mass.     Tenderness: There is abdominal tenderness in the left lower quadrant. There is no guarding or rebound.  Musculoskeletal:  General: No tenderness or deformity. Normal range of motion.     Cervical back: Normal range of motion and neck supple.  Lymphadenopathy:     Cervical: No cervical adenopathy.  Skin:    General: Skin is warm.     Coloration: Skin is not pale.     Findings: No erythema or rash.  Neurological:     Mental Status: He is alert and oriented to person, place, and time.     Cranial Nerves: No cranial nerve deficit.     Motor: No abnormal muscle tone.     Coordination: Coordination normal.     Deep Tendon Reflexes: Reflexes are normal and symmetric.  Psychiatric:        Behavior: Behavior normal.        Thought Content: Thought content normal.        Judgment: Judgment normal.          Assessment & Plan:  Fever, unspecified fever cause - Plan:  Urinalysis, Routine w reflex microscopic, CBC with Differential/Platelet, COMPLETE METABOLIC PANEL WITH GFR, CANCELED: DG Chest 2 View Differential diagnosis includes viral gastroenteritis, viral syndrome, or possibly early diverticulitis.  Given the tenderness to palpation of the left lower quadrant out of proportion to what I would expect from just a virus, I will treat the patient for early diverticulitis with Augmentin 875 mg twice daily for 10 days.  Obtain CBC CMP and urinalysis to evaluate for any other potential sources.  Reassess next week if no better or sooner if worse

## 2021-06-13 NOTE — Telephone Encounter (Signed)
   [facility] Pre-operative Risk Assessment    Patient Name: ZAYNE DRAHEIM  DOB: Dec 18, 1950 MRN: [id number]  HEARTCARE STAFF:  - IMPORTANT!!!!!! Under Visit Info/Reason for Call, type in Other and utilize the format Clearance MM/DD/YY or Clearance TBD. Do not use dashes or single digits. - Please review there is not already an duplicate clearance open for this procedure. - If request is for dental extraction, please clarify the # of teeth to be extracted. - If the patient is currently at the dentist's office, call Pre-Op Callback Staff (MA/nurse) to input urgent request.  - If the patient is not currently in the dentist office, please route to the Pre-Op pool.  Request for surgical clearance:  What type of surgery is being performed? L5-S Decompression  When is this surgery scheduled? TBD  What type of clearance is required (medical clearance vs. Pharmacy clearance to hold med vs. Both)? Both  Are there any medications that need to be held prior to surgery and how long? Xarelto  Practice name and name of physician performing surgery? [facility] Dahari Brooks  What is the office phone number? [phone]   7.   What is the office fax number? [phone]  8.   Anesthesia type (None, local, MAC, general) ? Georgiann Hahn 06/13/2021, 2:05 PM  _________________________________________________________________   (provider comments below)

## 2021-06-13 NOTE — Telephone Encounter (Signed)
   Name: [name]  DOB: 1951-01-17  MRN: [id number]   Primary Cardiologist: [name], MD  Chart reviewed as part of pre-operative protocol coverage. Patient was contacted 06/13/2021 in reference to pre-operative risk assessment for pending surgery as outlined below.  [name] was last seen on 04/08/21 by Dr. Wynona Neat.  Since that day, [name] has done well.  Therefore, based on ACC/AHA guidelines, the patient would be at acceptable risk for the planned procedure without further cardiovascular testing.   Pharmacy to review anticoagulation.   [place], [place] 06/13/2021, 3:09 PM

## 2021-06-16 ENCOUNTER — Other Ambulatory Visit: Payer: Medicare Other

## 2021-06-16 ENCOUNTER — Other Ambulatory Visit: Payer: Self-pay

## 2021-06-16 DIAGNOSIS — R509 Fever, unspecified: Secondary | ICD-10-CM | POA: Diagnosis not present

## 2021-06-16 DIAGNOSIS — S90212A Contusion of left great toe with damage to nail, initial encounter: Secondary | ICD-10-CM | POA: Diagnosis not present

## 2021-06-16 DIAGNOSIS — M79675 Pain in left toe(s): Secondary | ICD-10-CM | POA: Diagnosis not present

## 2021-06-16 LAB — URINALYSIS, ROUTINE W REFLEX MICROSCOPIC
Bacteria, UA: NONE SEEN /HPF
Bilirubin Urine: NEGATIVE
Casts: NONE SEEN /LPF
Crystals: NONE SEEN /HPF
Glucose, UA: NEGATIVE
Ketones, ur: NEGATIVE
Leukocytes,Ua: NEGATIVE
Nitrite: NEGATIVE
Protein, ur: NEGATIVE
Specific Gravity, Urine: 1.025 (ref 1.001–1.035)
WBC, UA: NONE SEEN /HPF (ref 0–5)
Yeast: NONE SEEN /HPF
pH: 5.5 (ref 5.0–8.0)

## 2021-06-16 LAB — MICROSCOPIC MESSAGE

## 2021-06-16 NOTE — Telephone Encounter (Signed)
   Patient Name: [name]  DOB: [birth year] MRN: [id number]  Primary Cardiologist: [name], MD  Chart reviewed as part of pre-operative protocol coverage. Pre-op clearance already addressed by colleagues in earlier phone notes. To summarize recommendations:  - Per Vin Bhagat PA-C, "Patient was contacted 06/13/2021 in reference to pre-operative risk assessment for pending surgery as outlined below.  [name] was last seen on 04/08/21 by Dr. Wynona Neat.  Since that day, [name] has done well. Therefore, based on ACC/AHA guidelines, the patient would be at acceptable risk for the planned procedure without further cardiovascular testing"  - Per pharmacist team, "Per office protocol, patient can hold Xarelto for 3 days prior to procedure.   Patient will not need bridging with Lovenox (enoxaparin) around procedure."  Will route this bundled recommendation to requesting provider via Epic fax function and remove from pre-op pool. Please call with questions.  Charlie Pitter, PA-C 06/16/2021, 11:16 AM

## 2021-06-16 NOTE — Telephone Encounter (Signed)
Patient with diagnosis of atrial fibrilation on Xarelto for anticoagulation.    Procedure: L5-S decompression Date of procedure: TBD   CHA2DS2-VASc Score = 3   This indicates a 3.2% annual risk of stroke. The patient's score is based upon: CHF History: 0 HTN History: 1 Diabetes History: 0 Stroke History: 0 Vascular Disease History: 1 Age Score: 1 Gender Score: 0   CrCl 67 Platelet count 220  Per office protocol, patient can hold Xarelto for 3 days prior to procedure.   Patient will not need bridging with Lovenox (enoxaparin) around procedure.

## 2021-06-17 LAB — CBC WITH DIFFERENTIAL/PLATELET
Absolute Monocytes: 775 cells/uL (ref 200–950)
Basophils Absolute: 68 cells/uL (ref 0–200)
Basophils Relative: 1 %
Eosinophils Absolute: 204 cells/uL (ref 15–500)
Eosinophils Relative: 3 %
HCT: 45.3 % (ref 38.5–50.0)
Hemoglobin: 15.5 g/dL (ref 13.2–17.1)
Lymphs Abs: 1945 cells/uL (ref 850–3900)
MCH: 32.2 pg (ref 27.0–33.0)
MCHC: 34.2 g/dL (ref 32.0–36.0)
MCV: 94.2 fL (ref 80.0–100.0)
MPV: 10 fL (ref 7.5–12.5)
Monocytes Relative: 11.4 %
Neutro Abs: 3808 cells/uL (ref 1500–7800)
Neutrophils Relative %: 56 %
Platelets: 220 10*3/uL (ref 140–400)
RBC: 4.81 10*6/uL (ref 4.20–5.80)
RDW: 12.7 % (ref 11.0–15.0)
Total Lymphocyte: 28.6 %
WBC: 6.8 10*3/uL (ref 3.8–10.8)

## 2021-06-17 LAB — COMPLETE METABOLIC PANEL WITH GFR
AG Ratio: 1.6 (calc) (ref 1.0–2.5)
ALT: 44 U/L (ref 9–46)
AST: 38 U/L — ABNORMAL HIGH (ref 10–35)
Albumin: 4.5 g/dL (ref 3.6–5.1)
Alkaline phosphatase (APISO): 93 U/L (ref 35–144)
BUN: 17 mg/dL (ref 7–25)
CO2: 27 mmol/L (ref 20–32)
Calcium: 9.4 mg/dL (ref 8.6–10.3)
Chloride: 101 mmol/L (ref 98–110)
Creat: 1.15 mg/dL (ref 0.70–1.28)
Globulin: 2.8 g/dL (calc) (ref 1.9–3.7)
Glucose, Bld: 165 mg/dL — ABNORMAL HIGH (ref 65–99)
Potassium: 4.7 mmol/L (ref 3.5–5.3)
Sodium: 140 mmol/L (ref 135–146)
Total Bilirubin: 0.8 mg/dL (ref 0.2–1.2)
Total Protein: 7.3 g/dL (ref 6.1–8.1)
eGFR: 68 mL/min/{1.73_m2} (ref >=60)

## 2021-06-17 LAB — TEST AUTHORIZATION

## 2021-06-17 LAB — HEMOGLOBIN A1C W/OUT EAG: Hgb A1c MFr Bld: 5.7 % of total Hgb — ABNORMAL HIGH (ref <5.7)

## 2021-06-18 DIAGNOSIS — H401131 Primary open-angle glaucoma, bilateral, mild stage: Secondary | ICD-10-CM | POA: Diagnosis not present

## 2021-06-19 ENCOUNTER — Other Ambulatory Visit: Payer: Self-pay

## 2021-06-19 ENCOUNTER — Ambulatory Visit (INDEPENDENT_AMBULATORY_CARE_PROVIDER_SITE_OTHER): Payer: Medicare Other

## 2021-06-19 ENCOUNTER — Telehealth: Payer: Self-pay

## 2021-06-19 ENCOUNTER — Ambulatory Visit: Payer: Self-pay | Admitting: Orthopedic Surgery

## 2021-06-19 VITALS — Ht 67.5 in | Wt 228.0 lb

## 2021-06-19 DIAGNOSIS — Z0000 Encounter for general adult medical examination without abnormal findings: Secondary | ICD-10-CM | POA: Diagnosis not present

## 2021-06-19 DIAGNOSIS — Z01818 Encounter for other preprocedural examination: Secondary | ICD-10-CM

## 2021-06-19 NOTE — Patient Instructions (Signed)
[name] , Thank you for taking time to come for your Medicare Wellness Visit. I appreciate your ongoing commitment to your health goals. Please review the following plan we discussed and let me know if I can assist you in the future.   Screening recommendations/referrals: Colonoscopy: Done 01/08/2016 Repeat in 10 years  Recommended yearly ophthalmology/optometry visit for glaucoma screening and checkup Recommended yearly dental visit for hygiene and checkup  Vaccinations: Influenza vaccine: Done 04/24/2021 Repeat annually  Pneumococcal vaccine: Done 1/1/201, 05/03/2014 and 04/03/2016 Tdap vaccine: 08/03/2010 Repeat in 10 years  Shingles vaccine: Shingrix discussed. Please contact your pharmacy for coverage information.   Covid-19: Done 09/14/2019, 10/13/2019 and 06/06/2020  Advanced directives: Advance directive discussed with you today. Even though you declined this today, please call our office should you change your mind, and we can give you the proper paperwork for you to fill out.   Conditions/risks identified: Aim for 30 minutes of exercise or  walking each day, drink 6-8 glasses of water and eat lots of fruits and vegetables.   Next appointment: Follow up in one year for your annual wellness visit. 2023.  Preventive Care 36 Years and Older, Male  Preventive care refers to lifestyle choices and visits with your health care provider that can promote health and wellness. What does preventive care include? A yearly physical exam. This is also called an annual well check. Dental exams once or twice a year. Routine eye exams. Ask your health care provider how often you should have your eyes checked. Personal lifestyle choices, including: Daily care of your teeth and gums. Regular physical activity. Eating a healthy diet. Avoiding tobacco and drug use. Limiting alcohol use. Practicing safe sex. Taking low doses of aspirin every day. Taking vitamin and mineral supplements as  recommended by your health care provider. What happens during an annual well check? The services and screenings done by your health care provider during your annual well check will depend on your age, overall health, lifestyle risk factors, and family history of disease. Counseling  Your health care provider may ask you questions about your: Alcohol use. Tobacco use. Drug use. Emotional well-being. Home and relationship well-being. Sexual activity. Eating habits. History of falls. Memory and ability to understand (cognition). Work and work Statistician. Screening  You may have the following tests or measurements: Height, weight, and BMI. Blood pressure. Lipid and cholesterol levels. These may be checked every 5 years, or more frequently if you are over 44 years old. Skin check. Lung cancer screening. You may have this screening every year starting at age 56 if you have a 30-pack-year history of smoking and currently smoke or have quit within the past 15 years. Fecal occult blood test (FOBT) of the stool. You may have this test every year starting at age 36. Flexible sigmoidoscopy or colonoscopy. You may have a sigmoidoscopy every 5 years or a colonoscopy every 10 years starting at age 75. Prostate cancer screening. Recommendations will vary depending on your family history and other risks. Hepatitis C blood test. Hepatitis B blood test. Sexually transmitted disease (STD) testing. Diabetes screening. This is done by checking your blood sugar (glucose) after you have not eaten for a while (fasting). You may have this done every 1-3 years. Abdominal aortic aneurysm (AAA) screening. You may need this if you are a current or former smoker. Osteoporosis. You may be screened starting at age 59 if you are at high risk. Talk with your health care provider about your test results, treatment  options, and if necessary, the need for more tests. Vaccines  Your health care provider may recommend  certain vaccines, such as: Influenza vaccine. This is recommended every year. Tetanus, diphtheria, and acellular pertussis (Tdap, Td) vaccine. You may need a Td booster every 10 years. Zoster vaccine. You may need this after age 64. Pneumococcal 13-valent conjugate (PCV13) vaccine. One dose is recommended after age 27. Pneumococcal polysaccharide (PPSV23) vaccine. One dose is recommended after age 24. Talk to your health care provider about which screenings and vaccines you need and how often you need them. This information is not intended to replace advice given to you by your health care provider. Make sure you discuss any questions you have with your health care provider. Document Released: 08/16/2015 Document Revised: 04/08/2016 Document Reviewed: 05/21/2015 Elsevier Interactive Patient Education  2017 [employer] Prevention in the Home Falls can cause injuries. They can happen to people of all ages. There are many things you can do to make your home safe and to help prevent falls. What can I do on the outside of my home? Regularly fix the edges of walkways and driveways and fix any cracks. Remove anything that might make you trip as you walk through a door, such as a raised step or threshold. Trim any bushes or trees on the path to your home. Use bright outdoor lighting. Clear any walking paths of anything that might make someone trip, such as rocks or tools. Regularly check to see if handrails are loose or broken. Make sure that both sides of any steps have handrails. Any raised decks and porches should have guardrails on the edges. Have any leaves, snow, or ice cleared regularly. Use sand or salt on walking paths during winter. Clean up any spills in your garage right away. This includes oil or grease spills. What can I do in the bathroom? Use night lights. Install grab bars by the toilet and in the tub and shower. Do not use towel bars as grab bars. Use non-skid mats or  decals in the tub or shower. If you need to sit down in the shower, use a plastic, non-slip stool. Keep the floor dry. Clean up any water that spills on the floor as soon as it happens. Remove soap buildup in the tub or shower regularly. Attach bath mats securely with double-sided non-slip rug tape. Do not have throw rugs and other things on the floor that can make you trip. What can I do in the bedroom? Use night lights. Make sure that you have a light by your bed that is easy to reach. Do not use any sheets or blankets that are too big for your bed. They should not hang down onto the floor. Have a firm chair that has side arms. You can use this for support while you get dressed. Do not have throw rugs and other things on the floor that can make you trip. What can I do in the kitchen? Clean up any spills right away. Avoid walking on wet floors. Keep items that you use a lot in easy-to-reach places. If you need to reach something above you, use a strong step stool that has a grab bar. Keep electrical cords out of the way. Do not use floor polish or wax that makes floors slippery. If you must use wax, use non-skid floor wax. Do not have throw rugs and other things on the floor that can make you trip. What can I do with my stairs? Do not  leave any items on the stairs. Make sure that there are handrails on both sides of the stairs and use them. Fix handrails that are broken or loose. Make sure that handrails are as long as the stairways. Check any carpeting to make sure that it is firmly attached to the stairs. Fix any carpet that is loose or worn. Avoid having throw rugs at the top or bottom of the stairs. If you do have throw rugs, attach them to the floor with carpet tape. Make sure that you have a light switch at the top of the stairs and the bottom of the stairs. If you do not have them, ask someone to add them for you. What else can I do to help prevent falls? Wear shoes that: Do not  have high heels. Have rubber bottoms. Are comfortable and fit you well. Are closed at the toe. Do not wear sandals. If you use a stepladder: Make sure that it is fully opened. Do not climb a closed stepladder. Make sure that both sides of the stepladder are locked into place. Ask someone to hold it for you, if possible. Clearly mark and make sure that you can see: Any grab bars or handrails. First and last steps. Where the edge of each step is. Use tools that help you move around (mobility aids) if they are needed. These include: Canes. Walkers. Scooters. Crutches. Turn on the lights when you go into a dark area. Replace any light bulbs as soon as they burn out. Set up your furniture so you have a clear path. Avoid moving your furniture around. If any of your floors are uneven, fix them. If there are any pets around you, be aware of where they are. Review your medicines with your doctor. Some medicines can make you feel dizzy. This can increase your chance of falling. Ask your doctor what other things that you can do to help prevent falls. This information is not intended to replace advice given to you by your health care provider. Make sure you discuss any questions you have with your health care provider. Document Released: 05/16/2009 Document Revised: 12/26/2015 Document Reviewed: 08/24/2014 Elsevier Interactive Patient Education  2017 [employer].

## 2021-06-19 NOTE — Progress Notes (Signed)
Subjective:   [name] is a 70 y.o. male who presents for an Initial Medicare Annual Wellness Visit. Virtual Visit via Telephone Note  I connected with  [name] on 06/19/21 at  9:45 AM EST by telephone and verified that I am speaking with the correct person using two identifiers.  Location: Patient: Home Provider: [facility] Persons participating in the virtual visit: patient/Nurse Health Advisor   I discussed the limitations, risks, security and privacy concerns of performing an evaluation and management service by telephone and the availability of in person appointments. The patient expressed understanding and agreed to proceed.  Interactive audio and video telecommunications were attempted between this nurse and patient, however failed, due to patient having technical difficulties OR patient did not have access to video capability.  We continued and completed visit with audio only.  Some vital signs may be absent or patient reported.   Chriss Driver, LPN  Review of Systems     Cardiac Risk Factors include: advanced age (>84mn, >>27women);hypertension;sedentary lifestyle;obesity (BMI >30kg/m2)     Objective:    Today's Vitals   06/19/21 0940 06/19/21 0942  Weight: 228 lb (103.4 kg)   Height: 5' 7.5" (1.715 m)   PainSc:  9    Body mass index is 35.18 kg/m.  Advanced Directives 06/19/2021 07/16/2020 07/18/2018 07/15/2018 01/28/2018 01/18/2018 03/22/2017  Does Patient Have a Medical Advance Directive? No Yes No No No No No  Would patient like information on creating a medical advance directive? No - Patient declined - No - Patient declined No - Patient declined No - Patient declined No - Patient declined No - Patient declined  Pre-existing out of facility DNR order (yellow form or pink MOST form) - - - - - - -    Current Medications (verified) Outpatient Encounter Medications as of 06/19/2021  Medication Sig   albuterol (VENTOLIN HFA) 108 (90 Base) MCG/ACT  inhaler Inhale 2 puffs into the lungs every 4 (four) hours as needed for wheezing or shortness of breath.   amoxicillin-clavulanate (AUGMENTIN) 875-125 MG tablet Take 1 tablet by mouth 2 (two) times daily.   atorvastatin (LIPITOR) 40 MG tablet Take 1 tablet by mouth once daily   Calcium Carbonate-Vitamin D (CALCIUM + D PO) Take 1 tablet by mouth at bedtime.    colchicine 0.6 MG tablet Take two tablets as one dose followed one hour later by one tablet.   fentaNYL (DURAGESIC - DOSED MCG/HR) 25 MCG/HR Place 25 mcg onto the skin every 3 (three) days.    fluticasone (FLONASE) 50 MCG/ACT nasal spray Place 1 spray into both nostrils at bedtime as needed for allergies or rhinitis.    levocetirizine (XYZAL) 5 MG tablet TAKE 1 TABLET BY MOUTH ONCE DAILY IN THE EVENING   meclizine (ANTIVERT) 25 MG tablet Take 1 tablet (25 mg total) by mouth 3 (three) times daily as needed for dizziness.   Multiple Vitamin (MULTIVITAMIN WITH MINERALS) TABS tablet Take 1 tablet by mouth at bedtime.    Omega-3 Fatty Acids (FISH OIL) 500 MG CAPS Take 500 mg by mouth at bedtime.    PRESCRIPTION MEDICATION Inhale into the lungs at bedtime. CPAP   rivaroxaban (XARELTO) 20 MG TABS tablet TAKE 1 TABLET BY MOUTH ONCE DAILY WITH SUPPER   sodium chloride (OCEAN) 0.65 % SOLN nasal spray Place 1 spray into both nostrils as needed for congestion.   SSD 1 % cream Apply topically daily.   traZODone (DESYREL) 50 MG tablet Take 100  mg by mouth at bedtime.   vortioxetine HBr (TRINTELLIX) 10 MG TABS tablet Take 1 tablet (10 mg total) by mouth at bedtime.   No facility-administered encounter medications on file as of 06/19/2021.    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   Cataracts, both eyes    worst on left   Colon polyps    Complication of anesthesia    pt had spinal for a knee replacement--"woke up" during surgery--and sick after surgery   Depression    Diverticulosis    DVT (deep venous thrombosis) ([place]) 2001 or 2002   right leg-required hospitalization x 8 days   DVT (deep venous thrombosis) ([place]) 1996   right   Glaucoma    both eyes   H/O hiatal hernia    Hearing impaired person, bilateral    bilateral   Hemorrhoid    History of IBS    History of kidney stones    x 1-passed   Hyperlipidemia 2012   Hypertension    Lumbago    Lumbar post-laminectomy syndrome    PONV (postoperative nausea and vomiting)    Sleep apnea    pt uses cpap - setting of 16   Ulcer 1966   Ulcerative colitis ([place])    Wears glasses    Wears hearing aid    Past Surgical History:  Procedure Laterality Date   APPENDECTOMY  1993   BACK SURGERY  2008    bone spur removed    bilateral carpal tunnel release 2009     cervical fusion 2011--pt has good range of motion neck     CHOLECYSTECTOMY  1994   EYE SURGERY N/A    [employer] 07/13/2020   INSERTION OF MESH Bilateral 07/31/2016   Procedure: INSERTION OF MESH;  Surgeon: [name], MD;  Location: [facility];  Service: General;  Laterality: Bilateral;   JOINT REPLACEMENT Bilateral    2001 left total knee and 1985 right total knee   MASS EXCISION Left 01/04/2014   Procedure: LEFT MIDDLE FINGER MASS EXCISION WITH NAILBED REPAIR AND ADVANCEMENT FLAP;  Surgeon: [name], MD;  Location: [facility];  Service: Orthopedics;  Laterality: Left;   mass removed from 2nd finger Left aug 2014   squamous carcinoma   nissen fundoplication 1610     PROSTATE SURGERY  2014   TURP   right wrist fusion 12/12 - pt wearing brace on the wrist-not started physical therapy yet Right    fusion  prevent limited ROM to right wrist   SPINE SURGERY  2009   cervical spine fusion   thyroid nodule     removed   TOTAL KNEE REVISION Right 11/17/2013   Procedure: RIGHT KNEE POLYETHYLENE REVISION, bone graft;  Surgeon: [name], MD;  Location: [facility];  Service: Orthopedics;  Laterality: Right;   TOTAL SHOULDER ARTHROPLASTY Right 01/28/2018   Procedure: RIGHT TOTAL SHOULDER ARTHROPLASTY;  Surgeon: [name], MD;  Location: [facility];  Service: Orthopedics;  Laterality: Right;   TOTAL SHOULDER ARTHROPLASTY Left 07/15/2018  Procedure: LEFT SHOULDER ANATOMIC TOTAL SHOULDER ARTHROPLASTY;  Surgeon: [name], MD;  Location: [place];  Service: Orthopedics;  Laterality: Left;   TRANSURETHRAL RESECTION OF PROSTATE  10/09/2011   Procedure: TRANSURETHRAL RESECTION OF THE PROSTATE WITH GYRUS INSTRUMENTS;  Surgeon: Fredricka Bonine, MD;  Location: [facility];  Service: Urology;  Laterality: N/A;   uvvvp     XI ROBOTIC ASSISTED INGUINAL HERNIA REPAIR WITH MESH Bilateral 07/31/2016   Procedure: XI ROBOTIC BILATERAL INGUINAL HERNIA REPAIR WITH MESH;  Surgeon: [name], MD;  Location: [facility];  Service: General;  Laterality: Bilateral;   Family History  Problem Relation Age of Onset   Colon cancer Father    Bone cancer Father        deceased   Cancer Father    Diabetes Father    Prostate cancer Father    Arthritis Mother    Hearing loss Mother    Hyperlipidemia Mother    Hypertension Mother    Deep vein thrombosis Mother    Diabetes Sister    Hearing loss Maternal Grandmother    Diabetes Paternal Grandfather    Social History   Socioeconomic History   Marital status: Married    Spouse name: Opal Sidles   Number of children: 1   Years of education: Not on file   Highest education level: Not on file  Occupational History   Occupation: disabled  Tobacco Use   Smoking status: Former    Packs/day: 1.00    Years: 19.00    Pack years: 19.00    Types: Cigarettes    Quit date: 08/04/1983     Years since quitting: 37.9   Smokeless tobacco: Never   Tobacco comments:    quit smoking 1985  Vaping Use   Vaping Use: Never used  Substance and Sexual Activity   Alcohol use: Yes    Alcohol/week: 6.0 standard drinks    Types: 6 Cans of beer per week    Comment: maybe 2 or 3 beers per week   Drug use: No   Sexual activity: Not Currently  Other Topics Concern   Not on file  Social History Narrative   Married x 3 years in 2021.   1 daughter, 1 grand son and 1 grand daughter   Social Determinants of Radio broadcast assistant Strain: Low Risk    Difficulty of Paying Living Expenses: Not hard at all  Food Insecurity: No Food Insecurity   Worried About Charity fundraiser in the Last Year: Never true   Arboriculturist in the Last Year: Never true  Transportation Needs: No Transportation Needs   Lack of Transportation (Medical): No   Lack of Transportation (Non-Medical): No  Physical Activity: Insufficiently Active   Days of Exercise per Week: 3 days   Minutes of Exercise per Session: 20 min  Stress: No Stress Concern Present   Feeling of Stress : Not at all  Social Connections: Socially Integrated   Frequency of Communication with Friends and Family: More than three times a week   Frequency of Social Gatherings with Friends and Family: More than three times a week   Attends Religious Services: 1 to 4 times per year   Active Member of [employer] or Organizations: Yes   Attends Music therapist: More than 4 times per year   Marital Status: Married    Tobacco Counseling Counseling given: Not Answered Tobacco comments: quit smoking 1985   Clinical Intake:  Pre-visit preparation completed: Yes  Pain : 0-10 Pain Score: 9  Pain Type: Acute pain (Has a cyst in back that is pressing on nerve. Has surgery scheduled on 07/17/2021.) Pain Location: Back Pain Descriptors / Indicators: Aching Pain Onset: 1 to 4 weeks ago Pain Frequency: Intermittent     BMI -  recorded: 35.18 Nutritional Status: BMI > 30  Obese Nutritional Risks: None Diabetes: No  How often do you need to have someone help you when you read instructions, pamphlets, or other written materials from your doctor or pharmacy?: 1 - Never  Diabetic?no  Interpreter Needed?: No  Information entered by :: MJ Carolos Fecher, LPN   Activities of Daily Living In your present state of health, do you have any difficulty performing the following activities: 06/19/2021 07/16/2020  Hearing? N N  Vision? N N  Difficulty concentrating or making decisions? Y N  Comment Memory -  Walking or climbing stairs? N N  Dressing or bathing? N N  Doing errands, shopping? N N  Preparing Food and eating ? N N  Using the Toilet? N N  In the past six months, have you accidently leaked urine? N N  Do you have problems with loss of bowel control? Y N  Comment 1 episode -  Managing your Medications? N N  Managing your Finances? N N  Housekeeping or managing your Housekeeping? N N  Some recent data might be hidden    Patient Care Team: [name], MD as PCP - General (Family Medicine) [name], MD as PCP - Cardiology (Cardiology) Edythe Clarity, [facility] as Pharmacist (Pharmacist)  Indicate any recent Medical Services you may have received from other than [facility] providers in the past year (date may be approximate).     Assessment:   This is a routine wellness examination for Antony.  Hearing/Vision screen Hearing Screening - Comments:: Wears hearing aids.  Vision Screening - Comments:: Glasses. [facility]. 06/18/21.  Dietary issues and exercise activities discussed: Current Exercise Habits: Home exercise routine, Type of exercise: walking, Time (Minutes): 20, Frequency (Times/Week): 3, Weekly Exercise (Minutes/Week): 60, Intensity: Mild, Exercise limited by: cardiac condition(s);orthopedic condition(s)   Goals Addressed             This Visit's Progress    DIET - REDUCE  CALORIE INTAKE       Would like to lose weight.        Depression Screen PHQ 2/9 Scores 06/19/2021 07/16/2020 05/06/2020 05/25/2019 05/25/2019 04/11/2018 08/25/2017  PHQ - 2 Score 0 0 0 0 0 0 2  PHQ- 9 Score - - - - - - 6  Exception Documentation - - - - - Other- indicate reason in comment box -  Not completed - - - - - on treatment -    Fall Risk Fall Risk  06/19/2021 07/16/2020 05/06/2020 05/25/2019 05/25/2019  Falls in the past year? 0 0 0 0 0  Comment - - - - -  Number falls in past yr: 0 0 0 0 -  Injury with Fall? 0 0 0 0 -  Risk for fall due to : Impaired balance/gait;Impaired mobility - - - -  Follow up Falls prevention discussed - Falls evaluation completed - Falls evaluation completed    FALL RISK PREVENTION PERTAINING TO THE HOME:  Any stairs in or around the home? Yes  If so, are there any without handrails? No  Home free of loose throw rugs in walkways, pet beds, electrical cords, etc? Yes  Adequate lighting in your home to  reduce risk of falls? Yes   ASSISTIVE DEVICES UTILIZED TO PREVENT FALLS:  Life alert? No  Use of a cane, walker or w/c? Yes  Grab bars in the bathroom? No  Shower chair or bench in shower? No  Elevated toilet seat or a handicapped toilet? No   TIMED UP AND GO:  Was the test performed? No . Phone visit.    Cognitive Function:     6CIT Screen 06/19/2021 07/16/2020  What Year? 0 points 0 points  What month? 0 points 0 points  What time? 0 points 0 points  Count back from 20 0 points 0 points  Months in reverse 2 points 0 points  Repeat phrase 2 points 0 points  Total Score 4 0    Immunizations Immunization History  Administered Date(s) Administered   Fluad Quad(high Dose 65+) 04/06/2019, 05/01/2020, 04/24/2021   Influenza Split 07/04/2012, 03/03/2013   Influenza,inj,Quad PF,6+ Mos 05/03/2014, 04/30/2015, 04/03/2016, 04/08/2017, 04/11/2018   Influenza,inj,quad, With Preservative 05/12/2017   Moderna Sars-Covid-2 Vaccination  09/14/2019, 06/06/2020   PFIZER(Purple Top)SARS-COV-2 Vaccination 10/13/2019   Pneumococcal Conjugate-13 05/03/2014   Pneumococcal Polysaccharide-23 08/03/2008, 04/03/2016   Tdap 08/03/2010   Zoster, Live 08/03/2010    TDAP status: Up to date  Flu Vaccine status: Up to date  Pneumococcal vaccine status: Up to date  Covid-19 vaccine status: Information provided on how to obtain vaccines.   Qualifies for Shingles Vaccine? Yes   Zostavax completed Yes   Shingrix Completed?: No.    Education has been provided regarding the importance of this vaccine. Patient has been advised to call insurance company to determine out of pocket expense if they have not yet received this vaccine. Advised may also receive vaccine at local pharmacy or Health Dept. Verbalized acceptance and understanding.  Screening Tests Health Maintenance  Topic Date Due   Zoster Vaccines- Shingrix (1 of 2) Never done   COVID-19 Vaccine (4 - Booster) 08/01/2020   TETANUS/TDAP  10/28/2021 (Originally 08/03/2020)   COLONOSCOPY (Pts 45-71yr Insurance coverage will need to be confirmed)  01/07/2026   Pneumonia Vaccine 70 Years old  Completed   INFLUENZA VACCINE  Completed   Hepatitis C Screening  Completed   HPV VACCINES  Aged Out    Health Maintenance  Health Maintenance Due  Topic Date Due   Zoster Vaccines- Shingrix (1 of 2) Never done   COVID-19 Vaccine (4 - Booster) 08/01/2020    Colorectal cancer screening: Type of screening: Colonoscopy. Completed 01/08/2016. Repeat every 10 years  Lung Cancer Screening: (Low Dose CT Chest recommended if Age 72-80years, 30 pack-year currently smoking OR have quit w/in 15years.) does not qualify.  Quit smoking over 15 years ago.  Additional Screening:  Hepatitis C Screening: does qualify; Completed 04/07/2018  Vision Screening: Recommended annual ophthalmology exams for early detection of glaucoma and other disorders of the eye. Is the patient up to date with their annual  eye exam?  Yes  Who is the provider or what is the name of the office in which the patient attends annual [place] exams[place] is not established with a provider, would they like to be referred to a provider to establish care? No .   Dental Screening: Recommended annual dental exams for proper oral hygiene  Community Resource Referral / Chronic Care Management: CRR required this visit?  No   CCM required this visit?  No      Plan:     I have personally reviewed and noted the following  in the patient's chart:   Medical and social history Use of alcohol, tobacco or illicit drugs  Current medications and supplements including opioid prescriptions. Patient is not currently taking opioid prescriptions. Functional ability and status Nutritional status Physical activity Advanced directives List of other physicians Hospitalizations, surgeries, and ER visits in previous 12 months Vitals Screenings to include cognitive, depression, and falls Referrals and appointments  In addition, I have reviewed and discussed with patient certain preventive protocols, quality metrics, and best practice recommendations. A written personalized care plan for preventive services as well as general preventive health recommendations were provided to patient.     Chriss Driver, LPN   [id number]   Nurse Notes: Has surgery to remove cyst from back on 07/17/2021. Up to date on all health maintenance. Discussed shingles vaccine and how to obtain.

## 2021-06-30 ENCOUNTER — Ambulatory Visit: Payer: Self-pay | Admitting: Orthopedic Surgery

## 2021-06-30 NOTE — H&P (Signed)
Subjective:   PMH: DVT (on xarelto), OSA, heart issues, chronic pain (pain management with Dr. Ellard [name] on fentanyl. CC: Radicular left leg pain. Imaginging studies show right sided synovial cyst at L5-S1   Surgery: Decompression/removal a cyst at [name] on 07/24/21 with Dr. Rolena [name].  Patient Active Problem List   Diagnosis Date Noted   Nonocclusive coronary atherosclerosis of native coronary artery 10/08/2019   Former smoker 09/26/2019   Cough 09/28/2018   Nasal congestion 09/28/2018   Paroxysmal atrial fibrillation ([name]) 08/25/2018   Cellulitis 07/18/2018   Essential hypertension 07/18/2018   Hyperlipidemia 07/18/2018   Allergic reaction 07/18/2018   Atrial fibrillation with RVR ([name]) 07/18/2018   H/O total shoulder replacement, left 07/15/2018   S/P shoulder replacement, right 01/28/2018   Robotic XI TAPP  bilateral inguinal hernia repair Dec 2017 07/31/2016   S/P bilateral inguinal herniorrhaphy 07/31/2016   Lumbar post-laminectomy syndrome    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[name]) 11/17/2013   OA (osteoarthritis) of knee 11/17/2013   OSA (obstructive sleep apnea) 12/05/2012   GERD 12/23/2007   RLQ PAIN 12/23/2007   PERSONAL HX COLONIC POLYPS 12/23/2007   Past Medical History:  Diagnosis Date   Arthritis    oa and ddd   Back pain, chronic    pt states he has 3 herniated disks--uses fentanyl patch for pain   Blood transfusion without reported diagnosis 1950   rH negative"at birth only"   BPH (benign prostatic hyperplasia)    frequent urination, not able to hold urine well   Cancer ([name])    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   Cataracts, both eyes    worst on left   Colon polyps    Complication of anesthesia    pt had spinal for a knee replacement--"woke up" during surgery--and sick after surgery   Depression    Diverticulosis    DVT (deep venous thrombosis) ([name]) 2001 or 2002   right leg-required hospitalization x 8 days   DVT (deep venous thrombosis) ([name]) 1996    right   Glaucoma    both eyes   H/O hiatal hernia    Hearing impaired person, bilateral    bilateral   Hemorrhoid    History of IBS    History of kidney stones    x 1-passed   Hyperlipidemia 2012   Hypertension    Lumbago    Lumbar post-laminectomy syndrome    PONV (postoperative nausea and vomiting)    Sleep apnea    pt uses cpap - setting of 16   Ulcer 1966   Ulcerative colitis ([name])    Wears glasses    Wears hearing aid     Past Surgical History:  Procedure Laterality Date   APPENDECTOMY  1993   BACK SURGERY  2008    bone spur removed    bilateral carpal tunnel release 2009     cervical fusion 2011--pt has good range of motion neck     CHOLECYSTECTOMY  1994   EYE SURGERY N/A    [employer] 07/13/2020   INSERTION OF MESH Bilateral 07/31/2016   Procedure: INSERTION OF MESH;  Surgeon: [name] Hausen, MD;  Location: [name];  Service: General;  Laterality: Bilateral;   JOINT REPLACEMENT Bilateral    2001 left total knee and 1985 right total knee   MASS EXCISION Left 01/04/2014   Procedure: LEFT MIDDLE FINGER MASS EXCISION WITH NAILBED REPAIR AND ADVANCEMENT FLAP;  Surgeon: [name] Kaufman, MD;  Location: [place];  Service: Orthopedics;  Laterality: Left;  mass removed from 2nd finger Left aug 2014   squamous carcinoma   nissen fundoplication [id number]     PROSTATE SURGERY  2014   TURP   right wrist fusion 12/12 - pt wearing brace on the wrist-not started physical therapy yet Right    fusion prevent limited ROM to right wrist   SPINE SURGERY  2009   cervical spine fusion   thyroid nodule     removed   TOTAL KNEE REVISION Right 11/17/2013   Procedure: RIGHT KNEE POLYETHYLENE REVISION, bone graft;  Surgeon: [name] Alf, MD;  [facility]: [name];  Service: Orthopedics;  Laterality: Right;   TOTAL SHOULDER ARTHROPLASTY Right 01/28/2018   Procedure: RIGHT TOTAL SHOULDER ARTHROPLASTY;  Surgeon: [name] Cedars, MD;  Location: [place];  Service: Orthopedics;   Laterality: Right;   TOTAL SHOULDER ARTHROPLASTY Left 07/15/2018   Procedure: LEFT SHOULDER ANATOMIC TOTAL SHOULDER ARTHROPLASTY;  Surgeon: [name] Cedars, MD;  Location: [name];  Service: Orthopedics;  Laterality: Left;   TRANSURETHRAL RESECTION OF PROSTATE  10/09/2011   Procedure: TRANSURETHRAL RESECTION OF THE PROSTATE WITH GYRUS INSTRUMENTS;  Surgeon: [name] Bonine, MD;  Location: [name];  Service: Urology;  Laterality: N/A;   uvvvp     XI ROBOTIC ASSISTED INGUINAL HERNIA REPAIR WITH MESH Bilateral 07/31/2016   Procedure: XI ROBOTIC BILATERAL INGUINAL HERNIA REPAIR WITH MESH;  Surgeon: [name] Hausen, MD;  [facility]: [name];  Service: General;  Laterality: Bilateral;    Current Outpatient Medications  Medication Sig Dispense Refill Last Dose   albuterol (VENTOLIN HFA) 108 (90 Base) MCG/ACT inhaler Inhale 2 puffs into the lungs every 4 (four) hours as needed for wheezing or shortness of breath. 18 g 2    amoxicillin-clavulanate (AUGMENTIN) 875-125 MG tablet Take 1 tablet by mouth 2 (two) times daily. 20 tablet 0    atorvastatin (LIPITOR) 40 MG tablet Take 1 tablet by mouth once daily 30 tablet 0    Calcium Carbonate-Vitamin D (CALCIUM + D PO) Take 1 tablet by mouth at bedtime.       colchicine 0.6 MG tablet Take two tablets as one dose followed one hour later by one tablet. 3 tablet 0    fentaNYL (DURAGESIC - DOSED MCG/HR) 25 MCG/HR Place 25 mcg onto the skin every 3 (three) days.       fluticasone (FLONASE) 50 MCG/ACT nasal spray Place 1 spray into both nostrils at bedtime as needed for allergies or rhinitis.       levocetirizine (XYZAL) 5 MG tablet TAKE 1 TABLET BY MOUTH ONCE DAILY IN THE EVENING 90 tablet 2    meclizine (ANTIVERT) 25 MG tablet Take 1 tablet (25 mg total) by mouth 3 (three) times daily as needed for dizziness. 30 tablet 0    Multiple Vitamin (MULTIVITAMIN WITH MINERALS) TABS tablet Take 1 tablet by mouth at bedtime.       Omega-3 Fatty Acids (FISH OIL) 500 MG CAPS Take  500 mg by mouth at bedtime.       PRESCRIPTION MEDICATION Inhale into the lungs at bedtime. CPAP      rivaroxaban (XARELTO) 20 MG TABS tablet TAKE 1 TABLET BY MOUTH ONCE DAILY WITH SUPPER 90 tablet 1    sodium chloride (OCEAN) 0.65 % SOLN nasal spray Place 1 spray into both nostrils as needed for congestion. 88 mL 0    SSD 1 % cream Apply topically daily.      traZODone (DESYREL) 50 MG tablet Take 100 mg by mouth at bedtime.  vortioxetine HBr (TRINTELLIX) 10 MG TABS tablet Take 1 tablet (10 mg total) by mouth at bedtime. 30 tablet 5    No current facility-administered medications for this visit.   Allergies  Allergen Reactions   Adhesive [Tape] Other (See Comments)    Causes blisters - pls use paper tape   Other Other (See Comments)    "Adhesives"Pt allergic to some tapes--pt request that we only use paper tape!!    Social History   Tobacco Use   Smoking status: Former    Packs/day: 1.00    Years: 19.00    Pack years: 19.00    Types: Cigarettes    Quit date: 08/04/1983    Years since quitting: 37.9   Smokeless tobacco: Never   Tobacco comments:    quit smoking 1985  Substance Use Topics   Alcohol use: Yes    Alcohol/week: 6.0 standard drinks    Types: 6 Cans of beer per week    Comment: maybe 2 or 3 beers per week    Family History  Problem Relation Age of Onset   Colon cancer Father    Bone cancer Father        deceased   Cancer Father    Diabetes Father    Prostate cancer Father    Arthritis Mother    Hearing loss Mother    Hyperlipidemia Mother    Hypertension Mother    Deep vein thrombosis Mother    Diabetes Sister    Hearing loss Maternal Grandmother    Diabetes Paternal Grandfather     Review of Systems Pertinent items are noted in HPI.  Objective:   Vitals: Ht: 5 ft 6.5 in 06/30/2021 11:05 am BP: 130/80 06/30/2021 11:06 am Pulse: 75 bpm 06/30/2021 11:06 am  General: Alert and oriented 3, no apparent distress  Ambulation: Antalgic, uses  cane  No obvious deformity  Heart: Regular rate and rhythm, no rubs, murmurs, or gallops  Lungs: Clear auscultation bilaterally  Abdomen: Bowel sounds 4, nontender, nondistended, no rebound tenderness, no loss of control of bladder or bowel function  Peripheral vascular: Extremities warm and well perfused. No significant edema is noted  Neuro: He continues to have significant left radicular leg pain with trace weakness of his gastrocnemius. Positive numbness and dysesthesias in the left S1 dermatome. Positive straight leg raise test. Negative Babinski test, and no clonus.  Lumbar MRI: completed on 06/11/2021 was reviewed with the patient. It was completed at [facility]; I have independently reviewed the images as well as the radiology report. Large left L5-S1 facet cyst causing compression of the left S1 nerve root. Moderate degenerative changes of the disc space. Patient has mild to moderate degenerative stenosis and foraminal disease L2-5. No acute fracture. No abnormal marrow signal changes.  Assessment:   [name] is a very pleasant 70 year old gentleman with acute onset of severe back and radicular left leg pain. Imaging studies confirmed a large synovial cyst causing nerve compression consistent with his clinical exam. At this point time due to the severe radicular pain and neurological deficits I think it is reasonable to move forward with a lumbar decompression. This would be essentially a lumbar decompression and partial facetectomy to excise the cyst. I have gone over the surgical procedure with the patient and his wife and addressed all of their questions. I have also gone over the risks, benefits, and alternatives of surgery.   Plan:   Risks and benefits of decompression/discectomy: Infection, bleeding, death, stroke, paralysis, ongoing or worse pain, need  for additional surgery, leak of spinal fluid, adjacent segment degeneration requiring additional surgery, post-operative hematoma  formation that can result in neurological compromise and the need for urgent/emergent re-operation. Loss in bowel and bladder control. Injury to major vessels that could result in the need for urgent abdominal surgery to stop bleeding. Risk of deep venous thrombosis (DVT) and the need for additional treatment. Recurrent disc herniation resulting in the need for revision surgery, which could include fusion surgery (utilizing instrumentation such as pedicle screws and intervertebral cages).  Additional risk: If instrumentation is used there is a risk of migration, or breakage of that hardware that could require additional surgery.  We have received preoperative medical clearance from the patient's primary care provider and cardiologist. He will hold his Xarelto 3 surgery. He is not on any aspirin, no NSAIDs. He does take a multivitamin which I advised him to hold 1 week prior to surgery as well.  He is scheduled for LSO brace fitting on 07/07/2021 with PT.  We have also discussed the post-operative recovery period to include: bathing/showering restrictions, wound healing, activity (and driving) restrictions, medications/pain mangement. Patient is currently enrolled in pain management with Dr. Elisha Ponder PA-C. He uses a fentanyl patch. We will defer postop pain management to [name] in [place].  We have also discussed post-operative redflags to include: signs and symptoms of postoperative infection, DVT/PE.  Discharge instructions were reviewed with patient. He was given a copy of his discharge instructions today. All questions invited and answered.  Follow-up: 2 weeks postop

## 2021-07-07 DIAGNOSIS — M5459 Other low back pain: Secondary | ICD-10-CM | POA: Diagnosis not present

## 2021-07-08 ENCOUNTER — Encounter: Payer: Self-pay | Admitting: Family Medicine

## 2021-07-08 ENCOUNTER — Ambulatory Visit (INDEPENDENT_AMBULATORY_CARE_PROVIDER_SITE_OTHER): Payer: Medicare Other | Admitting: Family Medicine

## 2021-07-08 ENCOUNTER — Other Ambulatory Visit: Payer: Self-pay

## 2021-07-08 VITALS — BP 138/72 | HR 72 | Temp 97.4°F | Resp 18 | Ht 67.5 in | Wt 233.0 lb

## 2021-07-08 DIAGNOSIS — I2510 Atherosclerotic heart disease of native coronary artery without angina pectoris: Secondary | ICD-10-CM | POA: Diagnosis not present

## 2021-07-08 DIAGNOSIS — K1120 Sialoadenitis, unspecified: Secondary | ICD-10-CM

## 2021-07-08 MED ORDER — AMOXICILLIN-POT CLAVULANATE 875-125 MG PO TABS: 1.0000 | ORAL_TABLET | Freq: Two times a day (BID) | ORAL | 0 refills | Status: DC

## 2021-07-08 NOTE — Progress Notes (Signed)
Subjective:    Patient ID: [name], male    DOB: 1950-08-04, 70 y.o.   MRN: [id number]   About a week ago, the patient was shaving.  There is a slight scab over the left angle of the mandible.  Shortly thereafter he developed significant swelling at the angle of the mandible on the left side.  Skin there is slightly erythematous.  There are some noticeable induration and swelling in the parotid gland in that area.  However he denies any tenderness.  He denies any trouble swallowing.  He denies any sore throat or otalgia or dental pain.  On examination, there is no abscess or swelling in the oral cavity.  The left tympanic membrane and auditory canal are normal.  There is some lymphadenopathy in the left side of the neck.  There is a 1 cm reactive lymph node above the left clavicle.  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   Cataracts, both eyes    worst on left   Colon polyps    Complication of anesthesia    pt had spinal for a knee replacement--"woke up" during surgery--and sick after surgery   Depression    Diverticulosis    DVT (deep venous thrombosis) ([place]) 2001 or 2002   right leg-required hospitalization x 8 days   DVT (deep venous thrombosis) ([place]) 1996   right   Glaucoma    both eyes   H/O hiatal hernia    Hearing impaired person, bilateral    bilateral   Hemorrhoid    History of IBS    History of kidney stones    x 1-passed   Hyperlipidemia 2012   Hypertension    Lumbago    Lumbar post-laminectomy syndrome    PONV (postoperative nausea and vomiting)    Sleep apnea    pt uses cpap - setting of 16   Ulcer 1966   Ulcerative  colitis ([place])    Wears glasses    Wears hearing aid    Past Surgical History:  Procedure Laterality Date   APPENDECTOMY  1993   BACK SURGERY  2008    bone spur removed    bilateral carpal tunnel release 2009     cervical fusion 2011--pt has good range of motion neck     CHOLECYSTECTOMY  1994   EYE SURGERY N/A    [employer] 07/13/2020   INSERTION OF MESH Bilateral 07/31/2016   Procedure: INSERTION OF MESH;  Surgeon: [name], MD;  [facility];  Service: General;  Laterality: Bilateral;   JOINT REPLACEMENT Bilateral    2001 left total knee and 1985 right total knee   MASS EXCISION Left 01/04/2014   Procedure: LEFT MIDDLE FINGER MASS EXCISION WITH NAILBED REPAIR AND ADVANCEMENT FLAP;  Surgeon: [name], MD;  Location: [place];  Service: Orthopedics;  Laterality: Left;   mass removed from 2nd finger Left aug 2014   squamous carcinoma   nissen fundoplication [id number]     PROSTATE SURGERY  2014   TURP   right wrist fusion 12/12 - pt wearing brace on  the wrist-not started physical therapy yet Right    fusion prevent limited ROM to right wrist   SPINE SURGERY  2009   cervical spine fusion   thyroid nodule     removed   TOTAL KNEE REVISION Right 11/17/2013   Procedure: RIGHT KNEE POLYETHYLENE REVISION, bone graft;  Surgeon: [name], MD;  [facility];  Service: Orthopedics;  Laterality: Right;   TOTAL SHOULDER ARTHROPLASTY Right 01/28/2018   Procedure: RIGHT TOTAL SHOULDER ARTHROPLASTY;  Surgeon: [name], MD;  Location: [place];  Service: Orthopedics;  Laterality: Right;   TOTAL SHOULDER ARTHROPLASTY Left 07/15/2018   Procedure: LEFT SHOULDER ANATOMIC TOTAL SHOULDER ARTHROPLASTY;  Surgeon: [name], MD;  Location: [place];  Service: Orthopedics;  Laterality: Left;   TRANSURETHRAL RESECTION OF PROSTATE  10/09/2011   Procedure: TRANSURETHRAL RESECTION OF THE PROSTATE WITH GYRUS INSTRUMENTS;  Surgeon: Fredricka Bonine, MD;  [facility];   Service: Urology;  Laterality: N/A;   uvvvp     XI ROBOTIC ASSISTED INGUINAL HERNIA REPAIR WITH MESH Bilateral 07/31/2016   Procedure: XI ROBOTIC BILATERAL INGUINAL HERNIA REPAIR WITH MESH;  Surgeon: [name], MD;  [facility];  Service: General;  Laterality: Bilateral;   Current Outpatient Medications on File Prior to Visit  Medication Sig Dispense Refill   albuterol (VENTOLIN HFA) 108 (90 Base) MCG/ACT inhaler Inhale 2 puffs into the lungs every 4 (four) hours as needed for wheezing or shortness of breath. 18 g 2   atorvastatin (LIPITOR) 40 MG tablet Take 1 tablet by mouth once daily 30 tablet 0   Calcium Carbonate-Vitamin D (CALCIUM + D PO) Take 1 tablet by mouth at bedtime.      colchicine 0.6 MG tablet Take two tablets as one dose followed one hour later by one tablet. 3 tablet 0   fentaNYL (DURAGESIC - DOSED MCG/HR) 25 MCG/HR Place 25 mcg onto the skin every 3 (three) days.      fluticasone (FLONASE) 50 MCG/ACT nasal spray Place 1 spray into both nostrils at bedtime as needed for allergies or rhinitis.      levocetirizine (XYZAL) 5 MG tablet TAKE 1 TABLET BY MOUTH ONCE DAILY IN THE EVENING 90 tablet 2   meclizine (ANTIVERT) 25 MG tablet Take 1 tablet (25 mg total) by mouth 3 (three) times daily as needed for dizziness. 30 tablet 0   Multiple Vitamin (MULTIVITAMIN WITH MINERALS) TABS tablet Take 1 tablet by mouth at bedtime.      Omega-3 Fatty Acids (FISH OIL) 500 MG CAPS Take 500 mg by mouth at bedtime.      PRESCRIPTION MEDICATION Inhale into the lungs at bedtime. CPAP     rivaroxaban (XARELTO) 20 MG TABS tablet TAKE 1 TABLET BY MOUTH ONCE DAILY WITH SUPPER 90 tablet 1   sodium chloride (OCEAN) 0.65 % SOLN nasal spray Place 1 spray into both nostrils as needed for congestion. 88 mL 0   SSD 1 % cream Apply topically daily.     traZODone (DESYREL) 50 MG tablet Take 100 mg by mouth at bedtime.     vortioxetine HBr (TRINTELLIX) 10 MG TABS tablet Take 1 tablet (10 mg total) by  mouth at bedtime. 30 tablet 5   No current facility-administered medications on file prior to visit.   Allergies  Allergen Reactions   Adhesive [Tape] Other (See Comments)    Causes blisters - pls use paper tape   Other Other (See Comments)    "Adhesives"Pt allergic to some tapes--pt request that  we only use paper tape!!   Social History   Socioeconomic History   Marital status: Married    Spouse name: Opal Sidles   Number of children: 1   Years of education: Not on file   Highest education level: Not on file  Occupational History   Occupation: disabled  Tobacco Use   Smoking status: Former    Packs/day: 1.00    Years: 19.00    Pack years: 19.00    Types: Cigarettes    Quit date: 08/04/1983    Years since quitting: 37.9   Smokeless tobacco: Never   Tobacco comments:    quit smoking 1985  Vaping Use   Vaping Use: Never used  Substance and Sexual Activity   Alcohol use: Yes    Alcohol/week: 6.0 standard drinks    Types: 6 Cans of beer per week    Comment: maybe 2 or 3 beers per week   Drug use: No   Sexual activity: Not Currently  Other Topics Concern   Not on file  Social History Narrative   Married x 3 years in 2021.   1 daughter, 1 grand son and 1 grand daughter   Social Determinants of Radio broadcast assistant Strain: Low Risk    Difficulty of Paying Living Expenses: Not hard at all  Food Insecurity: No Food Insecurity   Worried About Charity fundraiser in the Last Year: Never true   Arboriculturist in the Last Year: Never true  Transportation Needs: No Transportation Needs   Lack of Transportation (Medical): No   Lack of Transportation (Non-Medical): No  Physical Activity: Insufficiently Active   Days of Exercise per Week: 3 days   Minutes of Exercise per Session: 20 min  Stress: No Stress Concern Present   Feeling of Stress : Not at all  Social Connections: Socially Integrated   Frequency of Communication with Friends and Family: More than three times a  week   Frequency of Social Gatherings with Friends and Family: More than three times a week   Attends Religious Services: 1 to 4 times per year   Active Member of [employer] or Organizations: Yes   Attends Music therapist: More than 4 times per year   Marital Status: Married  Human resources officer Violence: Not At Risk   Fear of Current or Ex-Partner: No   Emotionally Abused: No   Physically Abused: No   Sexually Abused: No   Family History  Problem Relation Age of Onset   Colon cancer Father    Bone cancer Father        deceased   Cancer Father    Diabetes Father    Prostate cancer Father    Arthritis Mother    Hearing loss Mother    Hyperlipidemia Mother    Hypertension Mother    Deep vein thrombosis Mother    Diabetes Sister    Hearing loss Maternal Grandmother    Diabetes Paternal Grandfather      Review of Systems  Neurological:  Positive for dizziness.  All other systems reviewed and are negative.     Objective:   Physical Exam Vitals reviewed.  Constitutional:      General: He is not in acute distress.    Appearance: He is well-developed. He is not diaphoretic.  HENT:     Head: Normocephalic and atraumatic.      Right Ear: Tympanic membrane, ear canal and external ear normal.     Left Ear: Tympanic  membrane, ear canal and external ear normal.     Mouth/Throat:     Pharynx: No oropharyngeal exudate or posterior oropharyngeal erythema.  Eyes:     General: No scleral icterus.       Right eye: No discharge.        Left eye: No discharge.     Conjunctiva/sclera: Conjunctivae normal.     Pupils: Pupils are equal, round, and reactive to light.  Neck:     Thyroid: No thyromegaly.     Vascular: No JVD.     Trachea: No tracheal deviation.   Cardiovascular:     Rate and Rhythm: Normal rate and regular rhythm.     Heart sounds: Normal heart sounds. No murmur heard.   No friction rub. No gallop.  Pulmonary:     Effort: Pulmonary effort is normal. No  respiratory distress.     Breath sounds: No stridor. No wheezing, rhonchi or rales.  Chest:     Chest wall: No tenderness.  Abdominal:     General: Bowel sounds are normal. There is no distension.     Palpations: Abdomen is soft. There is no mass.     Tenderness: There is no abdominal tenderness. There is no guarding or rebound.  Musculoskeletal:     Cervical back: Normal range of motion and neck supple.  Lymphadenopathy:     Cervical: Cervical adenopathy present.     Left cervical: Superficial cervical adenopathy present.  Skin:    General: Skin is warm.     Findings: Erythema present.  Neurological:     Mental Status: He is alert and oriented to person, place, and time.     Motor: No abnormal muscle tone.     Deep Tendon Reflexes: Reflexes are normal and symmetric.          Assessment & Plan:  Sialadenitis I believe the patient may have a superficial cellulitis that is spread into the inferior left parotid gland.  Begin Augmentin 875 mg twice daily and recheck in 10 days or sooner if worsening

## 2021-07-09 ENCOUNTER — Telehealth: Payer: Self-pay | Admitting: Pharmacist

## 2021-07-09 NOTE — Progress Notes (Addendum)
Chronic Care Management Pharmacy Assistant   Name: [name]  MRN: [id number] DOB: October 21, 1950   Reason for Encounter: Disease State - Hypertension Call     Recent office visits:  07/08/21 Jenna Luo, MD (PCP) - Family Medicine - Sialadenitis - Augmentin 875 BID for 10 days prescribed. Follow up if no improvement.   06/13/21 Jenna Luo, MD (PCP) - Family Medicine - Fever - Labs were ordered. amoxicillin-clavulanate (AUGMENTIN) 875-125 MG tablet take 1 tablet by mouth 2 (two) times daily prescribed. Follow up in 1 week.   04/24/21 Jenna Luo, MD (PCP) - Family Medicine - Rhinosinusitis - levocetirizine (XYZAL ALLERGY 24HR) 5 MG tablet take 1 tablet (5 mg total) by mouth every evening prescribed. Complete the Augmentin but add Flonase 2 sprays each nostril daily. Flu vaccine administered. Follow up in 1 week   Recent consult visits:  None noted.   Hospital visits: 05/28/21  Medication Reconciliation was completed by comparing discharge summary, patient's EMR and Pharmacy list, and upon discussion with patient.  Admitted to the hospital on 05/28/21 due to [place]. Discharge date was 05/28/21. Discharged from [facility] at [place].    New?Medications Started at [facility] Discharge:?? Colchicine 0.6 MG tablet Take two tablets as one dose followed one hour later by one tablet prescribed.   Medication Changes at Hospital Discharge: No changes noted.   Medications Discontinued at Hospital Discharge: No medications were discontinued at discharge.  Medications that remain the same after Hospital Discharge:??  All other medications will remain the same.    Hospital visits: 04/17/21  Medication Reconciliation was completed by comparing discharge summary, patient's EMR and Pharmacy list, and upon discussion with patient.  Admitted to the hospital on 04/17/21 due to Acute Sinusitis. Discharge date was 04/17/21. Discharged from [facility] at  [place].     New?Medications Started at [facility] Discharge:?? amoxicillin-clavulanate (AUGMENTIN) 875-125 MG tablet take 1 tablet by mouth every 12 (twelve) hours for 10  prescribed.   Medication Changes at Hospital Discharge: No changes noted.   Medications Discontinued at Hospital Discharge: No medications were discontinued at discharge.   Medications that remain the same after Hospital Discharge:??  All other medications will remain the same.    Medications: Outpatient Encounter Medications as of 07/09/2021  Medication Sig   albuterol (VENTOLIN HFA) 108 (90 Base) MCG/ACT inhaler Inhale 2 puffs into the lungs every 4 (four) hours as needed for wheezing or shortness of breath.   amoxicillin-clavulanate (AUGMENTIN) 875-125 MG tablet Take 1 tablet by mouth 2 (two) times daily.   atorvastatin (LIPITOR) 40 MG tablet Take 1 tablet by mouth once daily   Calcium Carbonate-Vitamin D (CALCIUM + D PO) Take 1 tablet by mouth at bedtime.    colchicine 0.6 MG tablet Take two tablets as one dose followed one hour later by one tablet.   fentaNYL (DURAGESIC - DOSED MCG/HR) 25 MCG/HR Place 25 mcg onto the skin every 3 (three) days.    fluticasone (FLONASE) 50 MCG/ACT nasal spray Place 1 spray into both nostrils at bedtime as needed for allergies or rhinitis.    levocetirizine (XYZAL) 5 MG tablet TAKE 1 TABLET BY MOUTH ONCE DAILY IN THE EVENING   meclizine (ANTIVERT) 25 MG tablet Take 1 tablet (25 mg total) by mouth 3 (three) times daily as needed for dizziness.   Multiple Vitamin (MULTIVITAMIN WITH MINERALS) TABS tablet Take 1 tablet by mouth at bedtime.    Omega-3 Fatty Acids (FISH OIL) 500 MG CAPS Take  500 mg by mouth at bedtime.    PRESCRIPTION MEDICATION Inhale into the lungs at bedtime. CPAP   rivaroxaban (XARELTO) 20 MG TABS tablet TAKE 1 TABLET BY MOUTH ONCE DAILY WITH SUPPER   sodium chloride (OCEAN) 0.65 % SOLN nasal spray Place 1 spray into both nostrils as needed for congestion.   SSD 1 %  cream Apply topically daily.   traZODone (DESYREL) 50 MG tablet Take 100 mg by mouth at bedtime.   vortioxetine HBr (TRINTELLIX) 10 MG TABS tablet Take 1 tablet (10 mg total) by mouth at bedtime.   No facility-administered encounter medications on file as of 07/09/2021.    Current antihypertensive regimen:  None  How often are you checking your Blood Pressure?  Patient reported he is not currently checking his blood pressures.    Current home BP readings: patient is not currently checking blood pressures at home.    What recent interventions/DTPs have been made by any provider to improve Blood Pressure control since last CPP Visit:  Patient reported   Any recent hospitalizations or ED visits since last visit with CPP?  Patient did have 2 Urgent Care visits on 05/28/21 and 04/18/21.   What diet changes have been made to improve Blood Pressure Control?  Patient reported he does not eat salt at all anymore.     What exercise is being done to improve your Blood Pressure Control?  Patient states he is not currently able to exercise due to back pain from a cyst in his spine. He reports he is scheduled to have it removed on 07/24/21.    Adherence Review: Is the patient currently on ACE/ARB medication? No Does the patient have >5 day gap between last estimated fill dates? No   Care Gaps  AWV: done 06/19/21 Colonoscopy: due 01/07/26 DM Eye Exam: N/A DM Foot Exam: N/A Microalbumin: N/A HbgAIC: done 06/13/21 (5.7) DEXA: done 09/30/07 Mammogram: N/A   Star Rating Drugs: Atorvastatin 40 mg - last filled 05/22/21 60 days   Future Appointments  Date Time Provider [place]  08/28/2021 10:45 AM [facility]-CCM PHARMACIST [facility] None  06/25/2022  9:45 AM [facility]-NURSE HEALTH ADVISOR [facility] None   Jobe Gibbon, [place] Clinical Pharmacist Assistant  [phone]

## 2021-07-14 ENCOUNTER — Other Ambulatory Visit: Payer: Self-pay | Admitting: Family Medicine

## 2021-07-18 NOTE — Progress Notes (Signed)
Surgical Instructions    Your procedure is scheduled on 07/24/21.  Report to [facility] Main Entrance "A" at 5:30 A.M., then check in with the Admitting office.  Call this number if you have problems the morning of surgery:  [phone]   If you have any questions prior to your surgery date call [phone]: Open Monday-Friday 8am-4pm    Remember:  Do not eat after midnight the night before your surgery  You may drink clear liquids until 4:30am the morning of your surgery.   Clear liquids allowed are: Water, Non-Citrus Juices (without pulp), Carbonated Beverages, Clear Tea, Black Coffee ONLY (NO MILK, CREAM OR POWDERED CREAMER of any kind), and Gatorade    Take these medicines the morning of surgery with A SIP OF WATER  amoxicillin-clavulanate (AUGMENTIN) atorvastatin (LIPITOR)   IF NEEDED: albuterol (VENTOLIN HFA)  hydroxypropyl methylcellulose / hypromellose (ISOPTO TEARS / GONIOVISC) meclizine (ANTIVERT)   As of today, STOP taking any Aspirin (unless otherwise instructed by your surgeon) Aleve, Naproxen, Ibuprofen, Motrin, Advil, Goody's, BC's, all herbal medications, fish oil, and all vitamins.  Hold Xarelto for 3 days prior to procedure. Your last dose will be 07/20/21.   After your COVID test   You are not required to quarantine however you are required to wear a well-fitting mask when you are out and around people not in your household.  If your mask becomes wet or soiled, replace with a new one.  Wash your hands often with soap and water for 20 seconds or clean your hands with an alcohol-based hand sanitizer that contains at least 60% alcohol.  Do not share personal items.  Notify your provider: if you are in close contact with someone who has COVID  or if you develop a fever of 100.4 or greater, sneezing, cough, sore throat, shortness of breath or body aches.             Do not wear jewelry or makeup Do not wear lotions, powders, perfumes/colognes, or  deodorant. Men may shave face and neck. Do not bring valuables to the hospital. DO Not wear nail polish, gel polish, artificial nails, or any other type of covering on natural nails including finger and toenails. If patients have artificial nails, gel coating, etc. that need to be removed by a nail salon, please have this removed prior to surgery or surgery may need to be canceled/delayed if the surgeon/ anesthesia feels like the patient is unable to be adequately monitored.             [facility] is not responsible for any belongings or valuables.  Do NOT Smoke (Tobacco/Vaping)  24 hours prior to your procedure  If you use a CPAP at night, you may bring your mask for your overnight stay.   Contacts, glasses, hearing aids, dentures or partials may not be worn into surgery, please bring cases for these belongings   For patients admitted to the hospital, discharge time will be determined by your treatment team.   Patients discharged the day of surgery will not be allowed to drive home, and someone needs to stay with them for 24 hours.  NO VISITORS WILL BE ALLOWED IN PRE-OP WHERE PATIENTS ARE PREPPED FOR SURGERY.  ONLY 1 SUPPORT PERSON MAY BE PRESENT IN THE WAITING ROOM WHILE YOU ARE IN SURGERY.  IF YOU ARE TO BE ADMITTED, ONCE YOU ARE IN YOUR ROOM YOU WILL BE ALLOWED TWO (2) VISITORS. 1 (ONE) VISITOR MAY STAY OVERNIGHT BUT MUST ARRIVE TO THE ROOM  BY 8pm.  Minor children may have two parents present. Special consideration for safety and communication needs will be reviewed on a case by case basis.  Special instructions:    Oral Hygiene is also important to reduce your risk of infection.  Remember - BRUSH YOUR TEETH THE MORNING OF SURGERY WITH YOUR REGULAR TOOTHPASTE   [facility]- Preparing For Surgery  Before surgery, you can play an important role. Because skin is not sterile, your skin needs to be as free of germs as possible. You can reduce the number of germs on your skin by washing  with CHG (chlorahexidine gluconate) Soap before surgery.  CHG is an antiseptic cleaner which kills germs and bonds with the skin to continue killing germs even after washing.     Please do not use if you have an allergy to CHG or antibacterial soaps. If your skin becomes reddened/irritated stop using the CHG.  Do not shave (including legs and underarms) for at least 48 hours prior to first CHG shower. It is OK to shave your face.  Please follow these instructions carefully.     Shower the NIGHT BEFORE SURGERY and the MORNING OF SURGERY with CHG Soap.   If you chose to wash your hair, wash your hair first as usual with your normal shampoo. After you shampoo, rinse your hair and body thoroughly to remove the shampoo.  Then [employer] and genitals (private parts) with your normal soap and rinse thoroughly to remove soap.  After that Use CHG Soap as you would any other liquid soap. You can apply CHG directly to the skin and wash gently with a scrungie or a clean washcloth.   Apply the CHG Soap to your body ONLY FROM THE NECK DOWN.  Do not use on open wounds or open sores. Avoid contact with your eyes, ears, mouth and genitals (private parts). Wash Face and genitals (private parts)  with your normal soap.   Wash thoroughly, paying special attention to the area where your surgery will be performed.  Thoroughly rinse your body with warm water from the neck down.  DO NOT shower/wash with your normal soap after using and rinsing off the CHG Soap.  Pat yourself dry with a CLEAN TOWEL.  Wear CLEAN PAJAMAS to bed the night before surgery  Place CLEAN SHEETS on your bed the night before your surgery  DO NOT SLEEP WITH PETS.   Day of Surgery: Take a shower with CHG soap. Wear Clean/Comfortable clothing the morning of surgery Do not apply any deodorants/lotions.   Remember to brush your teeth WITH YOUR REGULAR TOOTHPASTE.   Please read over the following fact sheets that you were given.

## 2021-07-21 ENCOUNTER — Encounter (HOSPITAL_COMMUNITY)
Admission: RE | Admit: 2021-07-21 | Discharge: 2021-07-21 | Disposition: A | Discharge status: Home / Self Care | Payer: Medicare Other | Source: Ambulatory Visit | Attending: Orthopedic Surgery | Admitting: Orthopedic Surgery

## 2021-07-21 ENCOUNTER — Encounter (HOSPITAL_COMMUNITY): Payer: Self-pay

## 2021-07-21 ENCOUNTER — Other Ambulatory Visit: Payer: Self-pay

## 2021-07-21 DIAGNOSIS — G4733 Obstructive sleep apnea (adult) (pediatric): Secondary | ICD-10-CM | POA: Insufficient documentation

## 2021-07-21 DIAGNOSIS — Z20822 Contact with and (suspected) exposure to covid-19: Secondary | ICD-10-CM | POA: Insufficient documentation

## 2021-07-21 DIAGNOSIS — Z01812 Encounter for preprocedural laboratory examination: Secondary | ICD-10-CM | POA: Diagnosis not present

## 2021-07-21 DIAGNOSIS — E669 Obesity, unspecified: Secondary | ICD-10-CM | POA: Insufficient documentation

## 2021-07-21 DIAGNOSIS — I480 Paroxysmal atrial fibrillation: Secondary | ICD-10-CM | POA: Insufficient documentation

## 2021-07-21 DIAGNOSIS — I10 Essential (primary) hypertension: Secondary | ICD-10-CM | POA: Diagnosis not present

## 2021-07-21 DIAGNOSIS — H409 Unspecified glaucoma: Secondary | ICD-10-CM | POA: Diagnosis not present

## 2021-07-21 DIAGNOSIS — Z01818 Encounter for other preprocedural examination: Secondary | ICD-10-CM

## 2021-07-21 DIAGNOSIS — Z87891 Personal history of nicotine dependence: Secondary | ICD-10-CM | POA: Insufficient documentation

## 2021-07-21 DIAGNOSIS — Z6836 Body mass index (BMI) 36.0-36.9, adult: Secondary | ICD-10-CM | POA: Diagnosis not present

## 2021-07-21 DIAGNOSIS — I2510 Atherosclerotic heart disease of native coronary artery without angina pectoris: Secondary | ICD-10-CM | POA: Diagnosis not present

## 2021-07-21 DIAGNOSIS — G8929 Other chronic pain: Secondary | ICD-10-CM | POA: Insufficient documentation

## 2021-07-21 DIAGNOSIS — M549 Dorsalgia, unspecified: Secondary | ICD-10-CM | POA: Diagnosis not present

## 2021-07-21 DIAGNOSIS — M7138 Other bursal cyst, other site: Secondary | ICD-10-CM | POA: Diagnosis not present

## 2021-07-21 DIAGNOSIS — Z86718 Personal history of other venous thrombosis and embolism: Secondary | ICD-10-CM | POA: Insufficient documentation

## 2021-07-21 HISTORY — DX: Paroxysmal atrial fibrillation: I48.0

## 2021-07-21 LAB — BASIC METABOLIC PANEL
Anion gap: 9 (ref 5–15)
BUN: 10 mg/dL (ref 8–23)
CO2: 26 mmol/L (ref 22–32)
Calcium: 9.2 mg/dL (ref 8.9–10.3)
Chloride: 103 mmol/L (ref 98–111)
Creatinine, Ser: 1.07 mg/dL (ref 0.61–1.24)
GFR, Estimated: >60 mL/min (ref >60)
Glucose, Bld: 139 mg/dL — ABNORMAL HIGH (ref 70–99)
Potassium: 4.2 mmol/L (ref 3.5–5.1)
Sodium: 138 mmol/L (ref 135–145)

## 2021-07-21 LAB — CBC
HCT: 43.8 % (ref 39.0–52.0)
Hemoglobin: 15.3 g/dL (ref 13.0–17.0)
MCH: 32.6 pg (ref 26.0–34.0)
MCHC: 34.9 g/dL (ref 30.0–36.0)
MCV: 93.4 fL (ref 80.0–100.0)
Platelets: 172 10*3/uL (ref 150–400)
RBC: 4.69 MIL/uL (ref 4.22–5.81)
RDW: 12.5 % (ref 11.5–15.5)
WBC: 5.3 10*3/uL (ref 4.0–10.5)
nRBC: 0 % (ref 0.0–0.2)

## 2021-07-21 LAB — URINALYSIS, ROUTINE W REFLEX MICROSCOPIC
Bilirubin Urine: NEGATIVE
Glucose, UA: NEGATIVE mg/dL
Hgb urine dipstick: NEGATIVE
Ketones, ur: NEGATIVE mg/dL
Leukocytes,Ua: NEGATIVE
Nitrite: NEGATIVE
Protein, ur: NEGATIVE mg/dL
Specific Gravity, Urine: <1.005 — ABNORMAL LOW (ref 1.005–1.030)
pH: 6 (ref 5.0–8.0)

## 2021-07-21 LAB — SARS CORONAVIRUS 2 (TAT 6-24 HRS): SARS Coronavirus 2: NEGATIVE

## 2021-07-21 LAB — SURGICAL PCR SCREEN
MRSA, PCR: NEGATIVE
Staphylococcus aureus: NEGATIVE

## 2021-07-21 NOTE — Progress Notes (Signed)
PCP: Jenna Luo, MD Cardiologist: [name], MD  EKG: 04/08/21 CXR: na ECHO: 07/19/18 Stress Test: 11/11/11 Cardiac Cath: denies  Fasting Blood Sugar- na Checks Blood Sugar__na_ times a day  OSA/CPAP: Yes, wears nightly  ASA: No Blood Thinner: Xarelto last dose 07/20/21  Covid test 07/21/21 at PAT  Anesthesia Review: Yes, cardiac history.  Cardiac clearance note 06/16/21.    Patient denies shortness of breath, fever, cough, and chest pain at PAT appointment.  Patient verbalized understanding of instructions provided today at the PAT appointment.  Patient asked to review instructions at home and day of surgery.

## 2021-07-22 ENCOUNTER — Encounter (HOSPITAL_COMMUNITY): Payer: Self-pay

## 2021-07-22 NOTE — Progress Notes (Signed)
Anesthesia Chart Review:  Case: [id number] Date/Time: 07/24/21 0715   Procedure: Decompression and removal of cyst L5-S1   Anesthesia type: General   Pre-op diagnosis: left L5-S1 facet cyst   Location: [facility] OR ROOM 04 / [place] OR   Surgeons: Melina Schools, MD       DISCUSSION: Patient is a 70 year old male scheduled for the above procedure.  History includes former smoker (quit 08/04/83), post-operative N/V, HTN, chronic back pain, RLE DVT (~ 2001), PAF (07/2018), BPH (s/p TURP 10/09/11), glaucoma, ulcerative colitis, OSA (uses CPAP), hiatal hernia, skin cancer (SCC), osteoarthritis (left total shoulder 07/15/17, right total shoulder 01/28/18; left TKA 12/04/00; right TKA 1993 with polyethylene revision 11/17/13), spinal surgery (L1-2 microdiscectomy 05/01/04; C4-6 ACDF 06/06/09), bilateral IHR (08/01/16).  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  BMI is consistent with obesity.    Preoperative cardiology input outlined by Melina Copa, PA-C on 06/16/21, "- Per Robbie Lis PA-C, 'Patient was contacted 06/13/2021 in reference to pre-operative risk assessment for pending surgery as outlined below.  MARCELLINO FIDALGO was last seen on 04/08/21 by Dr. Wynona Neat.  Since that day, GLYNN YEPES has done well. Therefore, based on ACC/AHA guidelines, the patient would be at acceptable risk for the planned procedure without further cardiovascular testing'"   - Per pharmacist team, "Per office protocol, patient can hold Xarelto for 3 days prior to procedure.   Patient will not need bridging with Lovenox (enoxaparin) around procedure."  Last Xarelto 07/20/2021.  07/21/2021 presurgical COVID-19 test negative.  Anesthesia team to evaluate on the day of surgery.   VS: BP (!) 156/79    Pulse 84    Temp 37 C (Oral)    Resp 17    Ht 5' 7"  (1.702 m)    Wt 106.1 kg    SpO2 99%    BMI 36.63 kg/m   PROVIDERS: [name], MD is PCP  [name], MD is cardiologist.  Last visit 04/08/2021.  1 year follow-up  recommended. Chesley Mires, MD is pulmonologist.  Last visit 01/31/2021 for follow-up of OSA.   LABS: Labs reviewed: Acceptable for surgery. A1c 5.7% 06/13/2021. (all labs ordered are listed, but only abnormal results are displayed)  Labs Reviewed  BASIC METABOLIC PANEL - Abnormal; Notable for the following components:      Result Value   Glucose, Bld 139 (*)    All other components within normal limits  URINALYSIS, ROUTINE W REFLEX MICROSCOPIC - Abnormal; Notable for the following components:   Specific Gravity, Urine <1.005 (*)    All other components within normal limits  SURGICAL PCR SCREEN  SARS CORONAVIRUS 2 (TAT 6-24 HRS)  CBC     EKG: 04/08/2021: Normal sinus rhythm   CV: CT cardiac 05/25/19 FINDINGS: Coronary calcium score: The patient's coronary artery calcium score is 236, which places the patient in the 59 percentile.   Coronary arteries: Normal coronary origins.  Right dominance.   Right Coronary Artery: Dominant, large caliber vessel, gives rise to PDA and PLB. Proximal RCA has mixed calcified and noncalcified plaque with no more than 1-24% stenosis. Mid and distal RC has scattered mixed plaque but no significant stenosis.   Left Main Coronary Artery: Trivial calcified plaque, no significant stenosis.   Left Anterior Descending Coronary Artery: Scattered diffuse mixed calcified and noncalcified plaque in proximal LAD, with no more than 1-24% stenosis. Gives rise to 1 small and 1 medium caliber diagonal branches. Mid to distal LAD without significant plaque or stenosis, but caliber becomes small after  D2 branch. Distal LAD wraps LV apex.   Left Circumflex Artery: Proximal LCX has mixed calcified and noncalcified plaque with no more than 1-24% stenosis. Gives rise to 2 large caliber OM branches.   Aorta: Normal size, 34 mm at the mid ascending aorta (level of the PA bifurcation) measured double oblique. Mild calcifications. No dissection.   Aortic Valve:  No calcifications. Trileaflet.   Other findings:   Normal pulmonary vein drainage into the left atrium.   Normal left atrial appendage without a thrombus.   Normal size of the pulmonary artery.   IMPRESSION: 1.  Scattered nonobstructive CAD, CADRADS = 1. 2. Coronary calcium score of 236. This was 59th percentile for age and sex matched control. 3. Normal coronary origin with right dominance.    Monitor 08/02/18 ~21 days of data recorded on Biotel monitor. Patient had a min HR of 50 bpm, max HR of 181 bpm, and avg HR of 70 bpm. Predominant underlying rhythm was Sinus Rhythm. There was one episode of rapid atrial fibrillation on 08/06/2018 with a rate of 181 bpm. Total time in atrial fibrillation was 2 hrs 33 min, longest event was 13 minutes. AF occurred 38 time(s) with HR range of 75 - 181; Total AF Burden 1%   No VT, SVT, high degree block, or pauses noted. Isolated atrial and ventricular ectopy was rare (<1%). There were 16 symptomatic and 22 asymptomatic triggered events. All symptomatic events were either normal sinus rhythm, sinus tachycardia, or sinus rhythm with a PAC.    Echo 07/19/18  - Left ventricle: The cavity size was normal. Systolic function was   normal. The estimated ejection fraction was in the range of 60%   to 65%. Wall motion was normal; there were no regional wall   motion abnormalities. Features are consistent with a pseudonormal   left ventricular filling pattern, with concomitant abnormal   relaxation and increased filling pressure (grade 2 diastolic   dysfunction). There was no evidence of elevated ventricular   filling pressure by Doppler parameters. - Aortic root: The aortic root was normal in size. - Left atrium: The atrium was moderately dilated. - Right ventricle: The cavity size was normal. Wall thickness was   normal. Systolic function was normal. - Right atrium: The atrium was normal in size. - Tricuspid valve: There was mild regurgitation. -  Pulmonary arteries: Systolic pressure was within the normal   range. - Inferior vena cava: The vessel was dilated. The respirophasic   diameter changes were blunted (< 50%), consistent with elevated   central venous pressure. - Pericardium, extracardiac: There was no pericardial effusion.    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   Cataracts, both eyes    worst on left   Colon polyps    Complication of anesthesia    pt had spinal for a knee replacement--"woke up" during surgery--and sick after surgery   Depression    Diverticulosis    DVT (deep venous thrombosis) ([place]) 2001 or 2002   right leg-required hospitalization x 8 days   DVT (deep venous thrombosis) ([place]) 1996  right   Glaucoma    both eyes   H/O hiatal hernia    Hearing impaired person, bilateral    bilateral   Hemorrhoid    History of IBS    History of kidney stones    x 1-passed   Hyperlipidemia 2012   Hypertension    Lumbago    Lumbar post-laminectomy syndrome    PONV (postoperative nausea and vomiting)    Sleep apnea    pt uses cpap - setting of 16   Ulcer 1966   Ulcerative colitis ([place])    Wears glasses    Wears hearing aid     Past Surgical History:  Procedure Laterality Date   [place]  2008    bone spur removed    bilateral carpal tunnel release 2009     cervical fusion 2011--pt has good range of motion neck     CHOLECYSTECTOMY  1994   EYE SURGERY N/A    [employer] 07/13/2020   INSERTION OF MESH Bilateral 07/31/2016   Procedure: INSERTION OF MESH;  Surgeon: [name], MD;  Location: [facility];  Service: General;  Laterality:  Bilateral;   JOINT REPLACEMENT Bilateral    2001 left total knee and 1985 right total knee   MASS EXCISION Left 01/04/2014   Procedure: LEFT MIDDLE FINGER MASS EXCISION WITH NAILBED REPAIR AND ADVANCEMENT FLAP;  Surgeon: [name], MD;  Location: [place];  Service: Orthopedics;  Laterality: Left;   mass removed from 2nd finger Left aug 2014   squamous carcinoma   nissen fundoplication [id number]     PROSTATE SURGERY  2014   TURP   right wrist fusion 12/12 - pt wearing brace on the wrist-not started physical therapy yet Right    fusion prevent limited ROM to right wrist   SPINE SURGERY  2009   cervical spine fusion   thyroid nodule     removed   TOTAL KNEE REVISION Right 11/17/2013   Procedure: RIGHT KNEE POLYETHYLENE REVISION, bone graft;  Surgeon: [name], MD;  Location: [facility];  Service: Orthopedics;  Laterality: Right;   TOTAL SHOULDER ARTHROPLASTY Right 01/28/2018   Procedure: RIGHT TOTAL SHOULDER ARTHROPLASTY;  Surgeon: [name], MD;  Location: [place];  Service: Orthopedics;  Laterality: Right;   TOTAL SHOULDER ARTHROPLASTY Left 07/15/2018   Procedure: LEFT SHOULDER ANATOMIC TOTAL SHOULDER ARTHROPLASTY;  Surgeon: [name], MD;  Location: [place];  Service: Orthopedics;  Laterality: Left;   TRANSURETHRAL RESECTION OF PROSTATE  10/09/2011   Procedure: TRANSURETHRAL RESECTION OF THE PROSTATE WITH GYRUS INSTRUMENTS;  Surgeon: Fredricka Bonine, MD;  Location: [facility];  Service: Urology;  Laterality: N/A;   uvvvp     XI ROBOTIC ASSISTED INGUINAL HERNIA REPAIR WITH MESH Bilateral 07/31/2016   Procedure: XI ROBOTIC BILATERAL INGUINAL HERNIA REPAIR WITH MESH;  Surgeon: [name], MD;  Location: [facility];  Service: General;  Laterality: Bilateral;    MEDICATIONS:  albuterol (VENTOLIN HFA) 108 (90 Base) MCG/ACT inhaler   amoxicillin-clavulanate (AUGMENTIN) 875-125 MG tablet   atorvastatin (LIPITOR) 40 MG tablet   Calcium Carbonate-Vitamin D (CALCIUM + D  PO)   cholecalciferol (VITAMIN D3) 25 MCG (1000 UNIT) tablet   colchicine 0.6 MG tablet   fentaNYL (DURAGESIC - DOSED MCG/HR) 25 MCG/HR   fluticasone (FLONASE) 50 MCG/ACT nasal spray   hydroxypropyl methylcellulose / hypromellose (ISOPTO TEARS / GONIOVISC) 2.5 % ophthalmic solution   Latanoprost (XELPROS) 0.005 % EMUL   levocetirizine (XYZAL) 5 MG  tablet   meclizine (ANTIVERT) 25 MG tablet   Multiple Vitamin (MULTIVITAMIN WITH MINERALS) TABS tablet   PRESCRIPTION MEDICATION   rivaroxaban (XARELTO) 20 MG TABS tablet   sodium chloride (OCEAN) 0.65 % SOLN nasal spray   traZODone (DESYREL) 50 MG tablet   vortioxetine HBr (TRINTELLIX) 10 MG TABS tablet   No current facility-administered medications for this encounter.    Myra Gianotti, PA-C Surgical Short Stay/Anesthesiology [facility] Phone [phone] [facility] Phone [phone] 07/22/2021 3:10 PM

## 2021-07-22 NOTE — Anesthesia Preprocedure Evaluation (Addendum)
Anesthesia Evaluation  Patient identified by MRN, date of birth, ID band Patient awake    Reviewed: Allergy & Precautions, NPO status , Patient's Chart, lab work & pertinent test results  Airway Mallampati: IV  TM Distance: >3 FB Neck ROM: Full    Dental  (+) Dental Advisory Given   Pulmonary sleep apnea and Continuous Positive Airway Pressure Ventilation , former smoker,    breath sounds clear to auscultation       Cardiovascular hypertension, Pt. on medications + CAD   Rhythm:Regular Rate:Normal     Neuro/Psych negative neurological ROS     GI/Hepatic Neg liver ROS, hiatal hernia, PUD, GERD  ,  Endo/Other  negative endocrine ROS  Renal/GU negative Renal ROS     Musculoskeletal  (+) Arthritis ,   Abdominal   Peds  Hematology  (+) Blood dyscrasia (on xarelto. last dose 12/18), ,   Anesthesia Other Findings   Reproductive/Obstetrics                            Lab Results  Component Value Date   WBC 5.3 07/21/2021   HGB 15.3 07/21/2021   HCT 43.8 07/21/2021   MCV 93.4 07/21/2021   PLT 172 07/21/2021   Lab Results  Component Value Date   CREATININE 1.07 07/21/2021   BUN 10 07/21/2021   NA 138 07/21/2021   K 4.2 07/21/2021   CL 103 07/21/2021   CO2 26 07/21/2021    Anesthesia Physical Anesthesia Plan  ASA: 3  Anesthesia Plan: General   Post-op Pain Management: Tylenol PO (pre-op) and Ketamine IV   Induction:   PONV Risk Score and Plan: 2 and Dexamethasone, Ondansetron and Treatment may vary due to age or medical condition  Airway Management Planned: Oral ETT and Video Laryngoscope Planned  Additional Equipment: None  Intra-op Plan:   Post-operative Plan: Extubation in OR  Informed Consent: I have reviewed the patients History and Physical, chart, labs and discussed the procedure including the risks, benefits and alternatives for the proposed anesthesia with the  patient or authorized representative who has indicated his/her understanding and acceptance.     Dental advisory given  Plan Discussed with: CRNA  Anesthesia Plan Comments: ( )     Anesthesia Quick Evaluation

## 2021-07-23 ENCOUNTER — Other Ambulatory Visit: Payer: Self-pay | Admitting: Family Medicine

## 2021-07-24 ENCOUNTER — Ambulatory Visit (HOSPITAL_COMMUNITY): Payer: Medicare Other | Admitting: Anesthesiology

## 2021-07-24 ENCOUNTER — Ambulatory Visit (HOSPITAL_COMMUNITY): Payer: Medicare Other

## 2021-07-24 ENCOUNTER — Ambulatory Visit (HOSPITAL_COMMUNITY)
Admission: RE | Disposition: A | Discharge status: Home / Self Care | Payer: Self-pay | Source: Ambulatory Visit | Attending: Orthopedic Surgery

## 2021-07-24 ENCOUNTER — Observation Stay (HOSPITAL_COMMUNITY)
Admission: RE | Admit: 2021-07-24 | Discharge: 2021-07-25 | Disposition: A | Discharge status: Home / Self Care | Payer: Medicare Other | Source: Ambulatory Visit | Attending: Orthopedic Surgery | Admitting: Orthopedic Surgery

## 2021-07-24 ENCOUNTER — Other Ambulatory Visit: Payer: Self-pay

## 2021-07-24 ENCOUNTER — Encounter (HOSPITAL_COMMUNITY): Payer: Self-pay | Admitting: Orthopedic Surgery

## 2021-07-24 ENCOUNTER — Ambulatory Visit (HOSPITAL_COMMUNITY): Payer: Medicare Other | Admitting: Vascular Surgery

## 2021-07-24 DIAGNOSIS — I10 Essential (primary) hypertension: Secondary | ICD-10-CM | POA: Diagnosis not present

## 2021-07-24 DIAGNOSIS — G4730 Sleep apnea, unspecified: Secondary | ICD-10-CM | POA: Diagnosis not present

## 2021-07-24 DIAGNOSIS — M48062 Spinal stenosis, lumbar region with neurogenic claudication: Principal | ICD-10-CM | POA: Insufficient documentation

## 2021-07-24 DIAGNOSIS — I2510 Atherosclerotic heart disease of native coronary artery without angina pectoris: Secondary | ICD-10-CM | POA: Diagnosis not present

## 2021-07-24 DIAGNOSIS — Z86718 Personal history of other venous thrombosis and embolism: Secondary | ICD-10-CM | POA: Diagnosis not present

## 2021-07-24 DIAGNOSIS — M4807 Spinal stenosis, lumbosacral region: Secondary | ICD-10-CM | POA: Diagnosis not present

## 2021-07-24 DIAGNOSIS — M5136 Other intervertebral disc degeneration, lumbar region: Secondary | ICD-10-CM | POA: Diagnosis not present

## 2021-07-24 DIAGNOSIS — Z9989 Dependence on other enabling machines and devices: Secondary | ICD-10-CM | POA: Diagnosis not present

## 2021-07-24 DIAGNOSIS — Z85828 Personal history of other malignant neoplasm of skin: Secondary | ICD-10-CM | POA: Diagnosis not present

## 2021-07-24 DIAGNOSIS — Z79899 Other long term (current) drug therapy: Secondary | ICD-10-CM | POA: Diagnosis not present

## 2021-07-24 DIAGNOSIS — M48061 Spinal stenosis, lumbar region without neurogenic claudication: Secondary | ICD-10-CM | POA: Diagnosis present

## 2021-07-24 DIAGNOSIS — G4733 Obstructive sleep apnea (adult) (pediatric): Secondary | ICD-10-CM | POA: Diagnosis not present

## 2021-07-24 DIAGNOSIS — M7138 Other bursal cyst, other site: Secondary | ICD-10-CM | POA: Insufficient documentation

## 2021-07-24 DIAGNOSIS — Z8679 Personal history of other diseases of the circulatory system: Secondary | ICD-10-CM | POA: Diagnosis not present

## 2021-07-24 DIAGNOSIS — Z419 Encounter for procedure for purposes other than remedying health state, unspecified: Secondary | ICD-10-CM

## 2021-07-24 HISTORY — PX: LUMBAR LAMINECTOMY/DECOMPRESSION MICRODISCECTOMY: SHX5026

## 2021-07-24 SURGERY — LUMBAR LAMINECTOMY/DECOMPRESSION MICRODISCECTOMY
Anesthesia: General | Site: Spine Lumbar

## 2021-07-24 MED ORDER — HEMOSTATIC AGENTS (NO CHARGE) OPTIME
TOPICAL | Status: DC | PRN
  Administered 2021-07-24: 1 via TOPICAL

## 2021-07-24 MED ORDER — OXYCODONE HCL 5 MG PO TABS
ORAL_TABLET | ORAL | Status: AC
  Filled 2021-07-24: qty 2

## 2021-07-24 MED ORDER — SODIUM CHLORIDE 0.9% FLUSH: 3.0000 mL | INTRAVENOUS | Status: DC | PRN

## 2021-07-24 MED ORDER — DEXAMETHASONE SODIUM PHOSPHATE 10 MG/ML IJ SOLN
INTRAMUSCULAR | Status: DC | PRN
  Administered 2021-07-24: 10 mg via INTRAVENOUS

## 2021-07-24 MED ORDER — ONDANSETRON HCL 4 MG/2ML IJ SOLN: 4.0000 mg | Freq: Four times a day (QID) | INTRAMUSCULAR | Status: DC | PRN

## 2021-07-24 MED ORDER — METHOCARBAMOL 1000 MG/10ML IJ SOLN
500.0000 mg | Freq: Four times a day (QID) | INTRAVENOUS | Status: DC | PRN
  Filled 2021-07-24: qty 5

## 2021-07-24 MED ORDER — OXYCODONE HCL 5 MG PO TABS
10.0000 mg | ORAL_TABLET | ORAL | Status: DC | PRN
  Administered 2021-07-24 – 2021-07-25 (×4): 10 mg via ORAL
  Filled 2021-07-24 (×4): qty 2

## 2021-07-24 MED ORDER — ACETAMINOPHEN 500 MG PO TABS
ORAL_TABLET | ORAL | Status: AC
  Filled 2021-07-24: qty 1

## 2021-07-24 MED ORDER — ROCURONIUM BROMIDE 10 MG/ML (PF) SYRINGE
PREFILLED_SYRINGE | INTRAVENOUS | Status: AC
  Filled 2021-07-24: qty 10

## 2021-07-24 MED ORDER — MENTHOL 3 MG MT LOZG: 1.0000 | LOZENGE | OROMUCOSAL | Status: DC | PRN

## 2021-07-24 MED ORDER — ORAL CARE MOUTH RINSE: 15.0000 mL | Freq: Once | OROMUCOSAL | Status: AC

## 2021-07-24 MED ORDER — ONDANSETRON HCL 4 MG PO TABS: 4.0000 mg | ORAL_TABLET | Freq: Four times a day (QID) | ORAL | Status: DC | PRN

## 2021-07-24 MED ORDER — ATORVASTATIN CALCIUM 40 MG PO TABS
40.0000 mg | ORAL_TABLET | Freq: Every day | ORAL | Status: DC
  Administered 2021-07-24: 17:00:00 40 mg via ORAL
  Filled 2021-07-24: qty 1

## 2021-07-24 MED ORDER — SUCCINYLCHOLINE CHLORIDE 200 MG/10ML IV SOSY
PREFILLED_SYRINGE | INTRAVENOUS | Status: DC | PRN
Start: 2021-07-24 — End: 2021-07-24

## 2021-07-24 MED ORDER — ONDANSETRON HCL 4 MG PO TABS: 4.0000 mg | ORAL_TABLET | Freq: Three times a day (TID) | ORAL | 0 refills | Status: DC | PRN

## 2021-07-24 MED ORDER — PROPOFOL 10 MG/ML IV BOLUS
INTRAVENOUS | Status: AC
  Filled 2021-07-24: qty 20

## 2021-07-24 MED ORDER — LACTATED RINGERS IV SOLN: INTRAVENOUS | Status: DC

## 2021-07-24 MED ORDER — ACETAMINOPHEN 325 MG PO TABS
650.0000 mg | ORAL_TABLET | ORAL | Status: DC | PRN
  Administered 2021-07-25: 05:00:00 650 mg via ORAL
  Filled 2021-07-24: qty 2

## 2021-07-24 MED ORDER — OXYCODONE HCL 5 MG PO TABS: 5.0000 mg | ORAL_TABLET | ORAL | Status: DC | PRN

## 2021-07-24 MED ORDER — GABAPENTIN 300 MG PO CAPS
300.0000 mg | ORAL_CAPSULE | Freq: Three times a day (TID) | ORAL | Status: DC
  Administered 2021-07-24 (×2): 300 mg via ORAL
  Filled 2021-07-24 (×2): qty 1

## 2021-07-24 MED ORDER — METHOCARBAMOL 500 MG PO TABS: 500.0000 mg | ORAL_TABLET | Freq: Three times a day (TID) | ORAL | 0 refills | Status: AC | PRN

## 2021-07-24 MED ORDER — DEXAMETHASONE SODIUM PHOSPHATE 10 MG/ML IJ SOLN
INTRAMUSCULAR | Status: AC
  Filled 2021-07-24: qty 1

## 2021-07-24 MED ORDER — METHOCARBAMOL 500 MG PO TABS
500.0000 mg | ORAL_TABLET | Freq: Four times a day (QID) | ORAL | Status: DC | PRN
  Administered 2021-07-24 – 2021-07-25 (×2): 500 mg via ORAL
  Filled 2021-07-24 (×2): qty 1

## 2021-07-24 MED ORDER — FENTANYL CITRATE (PF) 100 MCG/2ML IJ SOLN: 25.0000 ug | INTRAMUSCULAR | Status: DC | PRN

## 2021-07-24 MED ORDER — THROMBIN 20000 UNITS EX SOLR
CUTANEOUS | Status: AC
  Filled 2021-07-24: qty 20000

## 2021-07-24 MED ORDER — KETAMINE HCL 10 MG/ML IJ SOLN
INTRAMUSCULAR | Status: DC | PRN
  Administered 2021-07-24: 10 mg via INTRAVENOUS
  Administered 2021-07-24: 30 mg via INTRAVENOUS

## 2021-07-24 MED ORDER — DEXAMETHASONE SODIUM PHOSPHATE 4 MG/ML IJ SOLN
4.0000 mg | Freq: Four times a day (QID) | INTRAMUSCULAR | Status: DC
  Administered 2021-07-25: 05:00:00 4 mg via INTRAVENOUS
  Filled 2021-07-24: qty 1

## 2021-07-24 MED ORDER — BUPIVACAINE-EPINEPHRINE 0.25% -1:200000 IJ SOLN
INTRAMUSCULAR | Status: DC | PRN
  Administered 2021-07-24: 10 mL

## 2021-07-24 MED ORDER — ALBUTEROL SULFATE HFA 108 (90 BASE) MCG/ACT IN AERS: 2.0000 | INHALATION_SPRAY | RESPIRATORY_TRACT | Status: DC | PRN

## 2021-07-24 MED ORDER — POLYETHYLENE GLYCOL 3350 17 G PO PACK: 17.0000 g | PACK | Freq: Every day | ORAL | Status: DC | PRN

## 2021-07-24 MED ORDER — CEFAZOLIN SODIUM-DEXTROSE 2-4 GM/100ML-% IV SOLN
2.0000 g | INTRAVENOUS | Status: AC
  Administered 2021-07-24: 08:00:00 2 g via INTRAVENOUS
  Filled 2021-07-24: qty 100

## 2021-07-24 MED ORDER — THROMBIN 20000 UNITS EX KIT
PACK | CUTANEOUS | Status: DC | PRN
  Administered 2021-07-24: 09:00:00 20 mL via TOPICAL

## 2021-07-24 MED ORDER — BUPIVACAINE-EPINEPHRINE (PF) 0.25% -1:200000 IJ SOLN
INTRAMUSCULAR | Status: AC
  Filled 2021-07-24: qty 30

## 2021-07-24 MED ORDER — DEXAMETHASONE 4 MG PO TABS
4.0000 mg | ORAL_TABLET | Freq: Four times a day (QID) | ORAL | Status: DC
  Administered 2021-07-24 (×2): 4 mg via ORAL
  Filled 2021-07-24 (×2): qty 1

## 2021-07-24 MED ORDER — EPHEDRINE SULFATE-NACL 50-0.9 MG/10ML-% IV SOSY
PREFILLED_SYRINGE | INTRAVENOUS | Status: DC | PRN
  Administered 2021-07-24 (×2): 5 mg via INTRAVENOUS

## 2021-07-24 MED ORDER — PROPOFOL 10 MG/ML IV BOLUS
INTRAVENOUS | Status: DC | PRN
  Administered 2021-07-24: 20 mg via INTRAVENOUS
  Administered 2021-07-24 (×2): 50 mg via INTRAVENOUS
  Administered 2021-07-24: 150 mg via INTRAVENOUS

## 2021-07-24 MED ORDER — FENTANYL CITRATE (PF) 250 MCG/5ML IJ SOLN
INTRAMUSCULAR | Status: DC | PRN
  Administered 2021-07-24: 50 ug via INTRAVENOUS
  Administered 2021-07-24 (×3): 25 ug via INTRAVENOUS
  Administered 2021-07-24: 100 ug via INTRAVENOUS
  Administered 2021-07-24: 25 ug via INTRAVENOUS

## 2021-07-24 MED ORDER — SODIUM CHLORIDE 0.9% FLUSH
3.0000 mL | Freq: Two times a day (BID) | INTRAVENOUS | Status: DC
  Administered 2021-07-24: 16:00:00 3 mL via INTRAVENOUS

## 2021-07-24 MED ORDER — SUGAMMADEX SODIUM 200 MG/2ML IV SOLN
INTRAVENOUS | Status: DC | PRN
  Administered 2021-07-24: 200 mg via INTRAVENOUS

## 2021-07-24 MED ORDER — GLYCOPYRROLATE PF 0.2 MG/ML IJ SOSY
PREFILLED_SYRINGE | INTRAMUSCULAR | Status: AC
  Filled 2021-07-24: qty 1

## 2021-07-24 MED ORDER — HYDROMORPHONE HCL 1 MG/ML IJ SOLN: 1.0000 mg | INTRAMUSCULAR | Status: DC | PRN

## 2021-07-24 MED ORDER — ROCURONIUM BROMIDE 10 MG/ML (PF) SYRINGE
PREFILLED_SYRINGE | INTRAVENOUS | Status: DC | PRN
  Administered 2021-07-24 (×2): 20 mg via INTRAVENOUS
  Administered 2021-07-24: 60 mg via INTRAVENOUS
  Administered 2021-07-24: 10 mg via INTRAVENOUS

## 2021-07-24 MED ORDER — ACETAMINOPHEN 500 MG PO TABS
1000.0000 mg | ORAL_TABLET | Freq: Once | ORAL | Status: AC
  Administered 2021-07-24: 06:00:00 1000 mg via ORAL
  Filled 2021-07-24: qty 2

## 2021-07-24 MED ORDER — SODIUM CHLORIDE 0.9 % IV SOLN: 250.0000 mL | INTRAVENOUS | Status: DC

## 2021-07-24 MED ORDER — FLEET ENEMA 7-19 GM/118ML RE ENEM: 1.0000 | ENEMA | Freq: Once | RECTAL | Status: DC | PRN

## 2021-07-24 MED ORDER — CHLORHEXIDINE GLUCONATE 0.12 % MT SOLN
15.0000 mL | Freq: Once | OROMUCOSAL | Status: AC
  Administered 2021-07-24: 06:00:00 15 mL via OROMUCOSAL
  Filled 2021-07-24: qty 15

## 2021-07-24 MED ORDER — MIDAZOLAM HCL 2 MG/2ML IJ SOLN
INTRAMUSCULAR | Status: AC
  Filled 2021-07-24: qty 2

## 2021-07-24 MED ORDER — 0.9 % SODIUM CHLORIDE (POUR BTL) OPTIME
TOPICAL | Status: DC | PRN
  Administered 2021-07-24: 09:00:00 1000 mL

## 2021-07-24 MED ORDER — GLYCOPYRROLATE PF 0.2 MG/ML IJ SOSY
PREFILLED_SYRINGE | INTRAMUSCULAR | Status: DC | PRN
  Administered 2021-07-24: .1 mg via INTRAVENOUS

## 2021-07-24 MED ORDER — FLUTICASONE PROPIONATE 50 MCG/ACT NA SUSP
1.0000 | Freq: Every day | NASAL | Status: DC
  Filled 2021-07-24 (×2): qty 16

## 2021-07-24 MED ORDER — CEFAZOLIN SODIUM-DEXTROSE 1-4 GM/50ML-% IV SOLN
1.0000 g | Freq: Three times a day (TID) | INTRAVENOUS | Status: AC
  Administered 2021-07-24 (×2): 1 g via INTRAVENOUS
  Filled 2021-07-24 (×2): qty 50

## 2021-07-24 MED ORDER — KETAMINE HCL 50 MG/5ML IJ SOSY
PREFILLED_SYRINGE | INTRAMUSCULAR | Status: AC
  Filled 2021-07-24: qty 5

## 2021-07-24 MED ORDER — ACETAMINOPHEN 650 MG RE SUPP: 650.0000 mg | RECTAL | Status: DC | PRN

## 2021-07-24 MED ORDER — PHENOL 1.4 % MT LIQD: 1.0000 | OROMUCOSAL | Status: DC | PRN

## 2021-07-24 MED ORDER — LATANOPROST 0.005 % OP SOLN
1.0000 [drp] | Freq: Every day | OPHTHALMIC | Status: DC
  Filled 2021-07-24 (×2): qty 2.5

## 2021-07-24 MED ORDER — TRANEXAMIC ACID 1000 MG/10ML IV SOLN
2000.0000 mg | Freq: Once | INTRAVENOUS | Status: AC
  Administered 2021-07-24: 10:00:00 2000 mg via TOPICAL
  Filled 2021-07-24: qty 20

## 2021-07-24 MED ORDER — AMISULPRIDE (ANTIEMETIC) 5 MG/2ML IV SOLN: 10.0000 mg | Freq: Once | INTRAVENOUS | Status: DC | PRN

## 2021-07-24 MED ORDER — ONDANSETRON HCL 4 MG/2ML IJ SOLN
INTRAMUSCULAR | Status: AC
  Filled 2021-07-24: qty 2

## 2021-07-24 MED ORDER — LIDOCAINE 2% (20 MG/ML) 5 ML SYRINGE
INTRAMUSCULAR | Status: DC | PRN
  Administered 2021-07-24: 60 mg via INTRAVENOUS

## 2021-07-24 MED ORDER — LIDOCAINE 2% (20 MG/ML) 5 ML SYRINGE
INTRAMUSCULAR | Status: AC
  Filled 2021-07-24: qty 5

## 2021-07-24 MED ORDER — MIDAZOLAM HCL 2 MG/2ML IJ SOLN
INTRAMUSCULAR | Status: DC | PRN
  Administered 2021-07-24: 2 mg via INTRAVENOUS

## 2021-07-24 MED ORDER — LATANOPROST 0.005 % OP EMUL: 1.0000 [drp] | Freq: Every day | OPHTHALMIC | Status: DC

## 2021-07-24 MED ORDER — HYPROMELLOSE (GONIOSCOPIC) 2.5 % OP SOLN: 1.0000 [drp] | Freq: Three times a day (TID) | OPHTHALMIC | Status: DC | PRN

## 2021-07-24 MED ORDER — FENTANYL CITRATE (PF) 250 MCG/5ML IJ SOLN
INTRAMUSCULAR | Status: AC
  Filled 2021-07-24: qty 5

## 2021-07-24 MED ORDER — ONDANSETRON HCL 4 MG/2ML IJ SOLN
INTRAMUSCULAR | Status: DC | PRN
  Administered 2021-07-24: 4 mg via INTRAVENOUS

## 2021-07-24 SURGICAL SUPPLY — 60 items
AGENT HMST KT MTR STRL THRMB (HEMOSTASIS) ×1
BAG COUNTER SPONGE SURGICOUNT (BAG) ×2 IMPLANT
BAG SPNG CNTER NS LX DISP (BAG) ×1
BAG SURGICOUNT SPONGE COUNTING (BAG) ×1
BNDG GAUZE ELAST 4 BULKY (GAUZE/BANDAGES/DRESSINGS) ×3 IMPLANT
CANISTER SUCT 3000ML PPV (MISCELLANEOUS) ×3 IMPLANT
CLOSURE STERI-STRIP 1/2X4 (GAUZE/BANDAGES/DRESSINGS)
CLOSURE WOUND 1/2 X4 (GAUZE/BANDAGES/DRESSINGS) ×1
CLSR STERI-STRIP ANTIMIC 1/2X4 (GAUZE/BANDAGES/DRESSINGS) ×1 IMPLANT
CNTNR URN SCR LID CUP LEK RST (MISCELLANEOUS) IMPLANT
CONT SPEC 4OZ STRL OR WHT (MISCELLANEOUS) ×3
COVER SURGICAL LIGHT HANDLE (MISCELLANEOUS) ×3 IMPLANT
DRAIN CHANNEL 15F RND FF W/TCR (WOUND CARE) IMPLANT
DRAPE SURG 17X23 STRL (DRAPES) ×5 IMPLANT
DRAPE U-SHAPE 47X51 STRL (DRAPES) ×3 IMPLANT
DRSG OPSITE POSTOP 4X6 (GAUZE/BANDAGES/DRESSINGS) ×3 IMPLANT
DRSG TELFA 3X8 NADH (GAUZE/BANDAGES/DRESSINGS) ×3 IMPLANT
DURAPREP 26ML APPLICATOR (WOUND CARE) ×3 IMPLANT
ELECT BLADE 4.0 EZ CLEAN MEGAD (MISCELLANEOUS) ×3
ELECT PENCIL ROCKER SW 15FT (MISCELLANEOUS) ×3 IMPLANT
ELECT REM PT RETURN 9FT ADLT (ELECTROSURGICAL) ×3
ELECTRODE BLDE 4.0 EZ CLN MEGD (MISCELLANEOUS) ×1 IMPLANT
ELECTRODE REM PT RTRN 9FT ADLT (ELECTROSURGICAL) ×1 IMPLANT
GLOVE SURG ENC MOIS LTX SZ6.5 (GLOVE) ×3 IMPLANT
GLOVE SURG MICRO LTX SZ8.5 (GLOVE) ×3 IMPLANT
GLOVE SURG UNDER POLY LF SZ6.5 (GLOVE) ×3 IMPLANT
GLOVE SURG UNDER POLY LF SZ8.5 (GLOVE) ×3 IMPLANT
GOWN STRL REUS W/ TWL XL LVL3 (GOWN DISPOSABLE) ×2 IMPLANT
GOWN STRL REUS W/TWL 2XL LVL3 (GOWN DISPOSABLE) ×3 IMPLANT
GOWN STRL REUS W/TWL XL LVL3 (GOWN DISPOSABLE) ×3
IV CATH 14GX2 1/4 (CATHETERS) IMPLANT
KIT BASIN OR (CUSTOM PROCEDURE TRAY) ×3 IMPLANT
KIT TURNOVER KIT B (KITS) ×3 IMPLANT
NDL SPNL 18GX3.5 QUINCKE PK (NEEDLE) ×2 IMPLANT
NEEDLE 22X1 1/2 (OR ONLY) (NEEDLE) ×3 IMPLANT
NEEDLE SPNL 18GX3.5 QUINCKE PK (NEEDLE) ×6 IMPLANT
NS IRRIG 1000ML POUR BTL (IV SOLUTION) ×3 IMPLANT
PACK LAMINECTOMY ORTHO (CUSTOM PROCEDURE TRAY) ×3 IMPLANT
PACK UNIVERSAL I (CUSTOM PROCEDURE TRAY) ×3 IMPLANT
PAD ARMBOARD 7.5X6 YLW CONV (MISCELLANEOUS) ×8 IMPLANT
PAD DRESSING TELFA 3X8 NADH (GAUZE/BANDAGES/DRESSINGS) IMPLANT
PATTIES SURGICAL .5 X.5 (GAUZE/BANDAGES/DRESSINGS) ×2 IMPLANT
PATTIES SURGICAL .5 X1 (DISPOSABLE) ×1 IMPLANT
SPONGE SURGIFOAM ABS GEL 100 (HEMOSTASIS) ×3 IMPLANT
SPONGE T-LAP 4X18 ~~LOC~~+RFID (SPONGE) ×6 IMPLANT
STRIP CLOSURE SKIN 1/2X4 (GAUZE/BANDAGES/DRESSINGS) ×1 IMPLANT
SURGIFLO W/THROMBIN 8M KIT (HEMOSTASIS) ×3 IMPLANT
SUT BONE WAX W31G (SUTURE) ×3 IMPLANT
SUT MNCRL AB 3-0 PS2 27 (SUTURE) ×3 IMPLANT
SUT VIC AB 1 CT1 18XCR BRD 8 (SUTURE) IMPLANT
SUT VIC AB 1 CT1 27 (SUTURE)
SUT VIC AB 1 CT1 27XBRD ANBCTR (SUTURE) ×1 IMPLANT
SUT VIC AB 1 CT1 8-18 (SUTURE) ×3
SUT VIC AB 2-0 CT1 18 (SUTURE) ×3 IMPLANT
SYR 3ML LL SCALE MARK (SYRINGE) ×1 IMPLANT
SYR CONTROL 10ML LL (SYRINGE) ×3 IMPLANT
TOWEL GREEN STERILE (TOWEL DISPOSABLE) ×3 IMPLANT
TOWEL GREEN STERILE FF (TOWEL DISPOSABLE) ×3 IMPLANT
WATER STERILE IRR 1000ML POUR (IV SOLUTION) ×1 IMPLANT
YANKAUER SUCT BULB TIP NO VENT (SUCTIONS) ×3 IMPLANT

## 2021-07-24 NOTE — Transfer of Care (Signed)
Immediate Anesthesia Transfer of Care Note  Patient: [name]  Procedure(s) Performed: DECOMPRESSION AND REMOVAL OF CYST LUMBAR FIVE THROUGH SACRAL ONE (Spine Lumbar)  Patient Location: PACU  Anesthesia Type:General  Level of Consciousness: awake  Airway & Oxygen Therapy: Patient Spontanous Breathing and Patient connected to face mask oxygen  Post-op Assessment: Report given to RN and Post -op Vital signs reviewed and stable  Post vital signs: Reviewed and stable  Last Vitals:  Vitals Value Taken Time  BP 121/69 07/24/21 1059  Temp    Pulse 84 07/24/21 1101  Resp 19 07/24/21 1101  SpO2 91 % 07/24/21 1101  Vitals shown include unvalidated device data.  Last Pain:  Vitals:   07/24/21 0558  PainSc: 8       Patients Stated Pain Goal: 3 (74/82/70 7867)  Complications: No notable events documented.

## 2021-07-24 NOTE — Anesthesia Postprocedure Evaluation (Signed)
Anesthesia Post Note  Patient: [name]  Procedure(s) Performed: DECOMPRESSION AND REMOVAL OF CYST LUMBAR FIVE THROUGH SACRAL ONE (Spine Lumbar)     Patient location during evaluation: PACU Anesthesia Type: General Level of consciousness: awake Pain management: pain level controlled Vital Signs Assessment: post-procedure vital signs reviewed and stable Respiratory status: spontaneous breathing, nonlabored ventilation, respiratory function stable and patient connected to nasal cannula oxygen Cardiovascular status: blood pressure returned to baseline and stable Postop Assessment: no apparent nausea or vomiting Anesthetic complications: no   No notable events documented.  Last Vitals:  Vitals:   07/24/21 1621 07/24/21 1919  BP: 131/76 130/71  Pulse: 75 84  Resp: 18 20  Temp: 36.5 C 37 C  SpO2: (!) 88% 93%    Last Pain:  Vitals:   07/24/21 2043  TempSrc:   PainSc: 7                  Roselind Klus P Neri Samek

## 2021-07-24 NOTE — Anesthesia Procedure Notes (Addendum)
Procedure Name: Intubation Date/Time: 07/24/2021 7:40 AM Performed by: Erick Colace, CRNA Pre-anesthesia Checklist: Patient identified, Emergency Drugs available, Suction available and Patient being monitored Patient Re-evaluated:Patient Re-evaluated prior to induction Oxygen Delivery Method: Circle system utilized Preoxygenation: Pre-oxygenation with 100% oxygen Induction Type: IV induction Ventilation: Mask ventilation without difficulty Laryngoscope Size: Glidescope and 4 Grade View: Grade I Tube type: Oral Tube size: 7.5 mm Number of attempts: 1 Airway Equipment and Method: Stylet and Oral airway Placement Confirmation: ETT inserted through vocal cords under direct vision, positive ETCO2 and breath sounds checked- equal and bilateral Secured at: 25 cm Tube secured with: Tape Dental Injury: Teeth and Oropharynx as per pre-operative assessment  Comments: Elective glidescope due to previous cervical fusion

## 2021-07-24 NOTE — Discharge Instructions (Signed)
Laminectomy, Care After This sheet gives you information about how to care for yourself after your procedure. Your health care provider may also give you more specific instructions. If you have problems or questions, contact your health care provider. What can I expect after the procedure? After the procedure, it is common to have: Some pain around your incision area. Muscle tightening (spasms) across the back.   Follow these instructions at home: Incision care Follow instructions from your health care provider about how to take care of your incision area. Make sure you: Wash your hands with soap and water before and after you apply medicine to the area or change your bandage (dressing). If soap and water are not available, use hand sanitizer. Change your dressing as told by your health care provider. Leave stitches (sutures), skin glue, or adhesive strips in place. These skin closures may need to stay in place for 2 weeks or longer. If adhesive strip edges start to loosen and curl up, you may trim the loose edges. Do not remove adhesive strips completely unless your health care provider tells you to do that.  Check your incision area every day for signs of infection. Check for: More redness, swelling, or pain. More fluid or blood. Warmth. Pus or a bad smell. Medicines Take over-the-counter and prescription medicines only as told by your health care provider. If you were prescribed an antibiotic medicine, use it as told by your health care provider. Do not stop using the antibiotic even if you start to feel better. If needed, call office in 3 days to request refill of pain medications. Bathing Do not take baths, swim, or use a hot tub for 6 weeks, or until your incision has healed completely. If your health care provider approves, you may take showers after your dressing has been removed. Ok to shower in 5 days Activity Return to your normal activities as told by your health care  provider. Ask your health care provider what activities are safe for you. Avoid bending or twisting at your waist. Always bend at your knees. Do not sit for more than 20-30 minutes at a time. Lie down or walk between periods of sitting. Do not lift anything that is heavier than 10 lb (4.5 kg) or the limit that your health care provider tells you, until he or she says that it is safe. Do not drive for 2 weeks after your procedure or for as long as your health care provider tells you.  Do not drive or use heavy machinery while taking prescription pain medicine. General instructions To prevent or treat constipation while you are taking prescription pain medicine, your health care provider may recommend that you: Drink enough fluid to keep your urine clear or pale yellow. Take over-the-counter or prescription medicines. Eat foods that are high in fiber, such as fresh fruits and vegetables, whole grains, and beans. Limit foods that are high in fat and processed sugars, such as fried and sweet foods. Do breathing exercises as told. Keep all follow-up visits as told by your health care provider. This is important. Contact a health care provider if: You have more redness, swelling, or pain around your incision area. Your incision feels warm to the touch. You are not able to return to activities or do exercises as told by your health care provider. Get help right away if: You have: More fluid or blood coming from your incision area. Pus or a bad smell coming from your incision area. Chills or a fever.  Episodes of dizziness or fainting while standing. You develop a rash. You develop shortness of breath or you have difficulty breathing. You cannot control when you urinate or have a bowel movement. You become weak. You are not able to use your legs. Summary After the procedure, it is common to have some pain around your incision area. You may also have muscle tightening (spasms) across the  back. Follow instructions from your health care provider about how to care for your incision. Do not lift anything that is heavier than 10 lb (4.5 kg) or the limit that your health care provider tells you, until he or she says that it is safe. Contact your health care provider if you have more redness, swelling, or pain around your incision area or if your incision feels warm to the touch. These can be signs of infection. This information is not intended to replace advice given to you by your health care provider. Make sure you discuss any questions you have with your health care provider. Refer to this sheet in the next few weeks. These instructions provide you with information about caring for yourself after your procedure. Your health care provider may also give you more specific instructions. Your treatment has been planned according to current medical practices, but problems sometimes occur. Call your health care provider if you have any problems or questions after your procedure. What can I expect after the procedure? It is common to have pain for the first few days after the procedure. Some people continue to have mild pain even after making a full recovery. Follow these instructions at home: Medicine Take medicines only as directed by your health care provider. Avoid taking over-the-counter pain medicines unless your health care provider tells you otherwise. These medicines interfere with the development and growth of new bone cells. If you were prescribed a narcotic pain medicine, take it exactly as told by your health care provider. Do not drink alcohol while on the medicine. Do not drive while on the medicine. Injury care Care for your back brace as told by your health care provider. If directed, apply ice to the injured area: Put ice in a plastic bag. Place a towel between your skin and the bag. Leave the ice on for 20 minutes, 2-3 times a day. Activity Perform physical therapy  exercises as told by your health care provider. Exercise regularly. Start by taking short walks. Slowly increase your activity level over time. Gentle exercise helps to ease pain. Sit, stand, walk, turn in bed, and reposition yourself as told by your health care provider. This will help to keep your spine in proper alignment. Avoid bending and twisting your body. Avoid doing strenuous household chores, such as vacuuming. Do not lift anything that is heavier than 10 lb (4.5 kg). Other Instructions Keep all follow-up visits as directed by your health care provider. This is important. Do not use any tobacco products, including cigarettes, chewing tobacco, or electronic cigarettes. If you need help quitting, ask your health care provider. Nicotine affects the way bones heal. Contact a health care provider if: Your pain gets worse. You have a fever. You have redness, swelling, or pain at the site of your incision. You have fluid, blood, or pus coming from your incision. You have numbness, tingling, or weakness in any part of your body. Get help right away if: Your incision feels swollen and tender, and the surrounding area looks like a lump. The lump may be red or bluish in color. You  cannot move any part of your body (paralysis). You cannot control your bladder or bowels.   RESTART PLAVIX ON 07/26/21 CALL MD IF YOU NOTICE EXCESSIVE BLEEDING, OR LOSS IN BOWEL/BLADDER CONTROL

## 2021-07-24 NOTE — H&P (Signed)
Chief Complaint:  Back and radicular leg pain due to spinal stenosis and synovial cyst  History: [name] is a very pleasant 70 year old gentleman with significant back buttock and bilateral leg pain consistent with lumbar spinal stenosis and neurogenic claudication.  He has degenerative spinal stenosis with facet arthrosis and a left large facet cyst.  There is mild degenerative changes at the remainder of the levels.  As result of the failure of conservative management he is elected to move forward with surgery.  All appropriate risks, benefits, and alternatives were discussed with the patient and his wife and consent was obtained.  Surgical plan: Lumbar decompression and excision of synovial cyst.  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   Cataracts, both eyes    worst on left   Colon polyps    Complication of anesthesia    pt had spinal for a knee replacement--"woke up" during surgery--and sick after surgery   Depression    Diverticulosis    DVT (deep venous thrombosis) ([place]) 2001 or 2002   right leg-required hospitalization x 8 days   DVT (deep venous thrombosis) ([place]) 1996   right   Glaucoma    both eyes   H/O hiatal hernia    Hearing impaired person, bilateral    bilateral   Hemorrhoid    History of IBS    History of kidney stones    x 1-passed   Hyperlipidemia 2012   Hypertension    Lumbago    Lumbar post-laminectomy syndrome    PAF (paroxysmal atrial fibrillation) ([facility])    PONV (postoperative nausea and vomiting)    Sleep apnea    pt uses cpap - setting of 16   Ulcer 1966   Ulcerative colitis ([place])    Wears glasses    Wears  hearing aid     Allergies  Allergen Reactions   Adhesive [Tape] Other (See Comments)    Causes blisters - pls use paper tape   Other Other (See Comments)    "Adhesives"Pt allergic to some tapes--pt request that we only use paper tape!!    No current facility-administered medications on file prior to encounter.   Current Outpatient Medications on File Prior to Encounter  Medication Sig Dispense Refill   albuterol (VENTOLIN HFA) 108 (90 Base) MCG/ACT inhaler Inhale 2 puffs into the lungs every 4 (four) hours as needed for wheezing or shortness of breath. 18 g 2   Calcium Carbonate-Vitamin D (CALCIUM + D PO) Take 1 tablet by mouth at bedtime.      cholecalciferol (VITAMIN D3) 25 MCG (1000 UNIT) tablet Take 1,000 Units by mouth daily.     fentaNYL (DURAGESIC - DOSED MCG/HR) 25 MCG/HR Place 25 mcg onto the skin every 3 (three) days.      fluticasone (FLONASE) 50 MCG/ACT nasal spray Place 1 spray into both nostrils at bedtime.     hydroxypropyl methylcellulose / hypromellose (ISOPTO TEARS / GONIOVISC) 2.5 % ophthalmic solution Place 1 drop into both eyes 3 (three) times daily as needed for dry eyes.     Latanoprost (XELPROS) 0.005 %  EMUL Place 1 drop into both eyes at bedtime.     levocetirizine (XYZAL) 5 MG tablet TAKE 1 TABLET BY MOUTH ONCE DAILY IN THE EVENING 90 tablet 2   Multiple Vitamin (MULTIVITAMIN WITH MINERALS) TABS tablet Take 1 tablet by mouth at bedtime.      rivaroxaban (XARELTO) 20 MG TABS tablet TAKE 1 TABLET BY MOUTH ONCE DAILY WITH SUPPER 90 tablet 1   sodium chloride (OCEAN) 0.65 % SOLN nasal spray Place 1 spray into both nostrils as needed for congestion. 88 mL 0   traZODone (DESYREL) 50 MG tablet Take 100 mg by mouth at bedtime.     vortioxetine HBr (TRINTELLIX) 10 MG TABS tablet Take 1 tablet (10 mg total) by mouth at bedtime. 30 tablet 5   colchicine 0.6 MG tablet Take two tablets as one dose followed one hour later by one tablet. (Patient not taking: Reported on  07/15/2021) 3 tablet 0   meclizine (ANTIVERT) 25 MG tablet Take 1 tablet (25 mg total) by mouth 3 (three) times daily as needed for dizziness. 30 tablet 0   PRESCRIPTION MEDICATION Inhale into the lungs at bedtime. CPAP      Physical Exam: Vitals:   07/24/21 0548  BP: (!) 154/78  Pulse: 73  Resp: 17  Temp: 98.3 F (36.8 C)  SpO2: 94%   Body mass index is 36.49 kg/m. General: Alert and oriented 3, no apparent distress  Ambulation: Antalgic, uses cane  No obvious deformity  Heart: Regular rate and rhythm, no rubs, murmurs, or gallops  Lungs: Clear auscultation bilaterally  Abdomen: Bowel sounds 4, nontender, nondistended, no rebound tenderness, no loss of control of bladder or bowel function  Peripheral vascular: Extremities warm and well perfused. No significant edema is noted  Neuro: He continues to have significant left radicular leg pain with trace weakness of his gastrocnemius. Positive numbness and dysesthesias in the left S1 dermatome. Positive straight leg raise test. Negative Babinski test, and no clonus.  Image: Lumbar MRI: completed on 06/11/2021 was reviewed with the patient. [facility] was completed at [facility]; I have independently reviewed the images as well as the radiology report. Large left L5-S1 facet cyst causing compression of the left S1 nerve root. Moderate degenerative changes of the disc space. Patient has mild to moderate degenerative stenosis and foraminal disease L2-5. No acute fracture. No abnormal marrow signal changes.   A/P: Diagnosis: [name] is a very pleasant 70 year old gentleman with acute onset of severe back and radicular left leg pain. Imaging studies confirmed a large synovial cyst causing nerve compression consistent with his clinical exam. At this point time due to the severe radicular pain and neurological deficits I think it is reasonable to move forward with a lumbar decompression. This would be essentially a lumbar decompression and partial  facetectomy to excise the cyst. I have gone over the surgical procedure with the patient and his wife and addressed all of their questions. I have also gone over the risks, benefits, and alternatives of surgery.  Risks and benefits of decompression/discectomy: Infection, bleeding, death, stroke, paralysis, ongoing or worse pain, need for additional surgery, leak of spinal fluid, adjacent segment degeneration requiring additional surgery, post-operative hematoma formation that can result in neurological compromise and the need for urgent/emergent re-operation. Loss in bowel and bladder control. Injury to major vessels that could result in the need for urgent abdominal surgery to stop bleeding. Risk of deep venous thrombosis (DVT) and the need for additional treatment. Recurrent disc herniation resulting in the need  for revision surgery, which could include fusion surgery (utilizing instrumentation such as pedicle screws and intervertebral cages).  Additional risk: If instrumentation is used there is a risk of migration, or breakage of that hardware that could require additional surgery.  We have received preoperative medical clearance from the patient's primary care provider and cardiologist. He will hold his Xarelto 3 surgery. He is not on any aspirin, no NSAIDs. He does take a multivitamin which I advised him to hold 1 week prior to surgery as well.

## 2021-07-24 NOTE — Op Note (Signed)
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 male who presents with significant back buttock and neuropathic leg pain.  Imaging studies demonstrated a large left L5-S1 synovial cyst lateral recess and central stenosis.  As result of the failure to improve with conservative modalities we elected to proceed with surgery.  All appropriate risks, benefits and alternatives to surgery were discussed with patient and consent was obtained.  PROCEDURE DETAILS: Patient was brought into the operating room and was properly positioned on the operating room table.  After induction with general anesthesia the patient was endotracheally intubated.  A timeout was taken to confirm all important data: including patient, procedure, and the level. Teds, SCD's were applied.   Patient was turned prone on all bony prominences were well-padded.  The back was then prepped and draped in the standard fashion.  2 needles were placed into the back and an x-ray was taken to localize the skin incision.  I marked out my incision site in the midline and infiltrated with quarter percent Marcaine with epinephrine.  Midline incision was made and sharp dissection was carried down to healthy fascia.  I stripped the paraspinal muscles to expose the L5-S1 spinous process lamina.  Facet complex was also identified.  A Penfield 4 was placed underneath the L5 lamina and an x-ray was taken to confirm I was at the appropriate level.  Once confirmed I used a double-action Leksell rongeur to  remove the inferior third of the spinous process of L5 I then used a rongeurs to perform a central decompression in hemilaminotomy.  I then used my Penfield 4 to dissect through the central portion of the ligamentum flavum and remove this with the Kerrison rongeur.  There was significant central stenosis and so I elected to proceed to the right side.  Was able to develop a plane and then remove the ligamentum flavum and extend my laminotomy.  I then went into the lateral recess and resected the medial aspect of the facet joint to completely decompress the right side. The epidural veins were identified and coagulated with bipolar electrocautery.  With the right side decompressed I then was able to begin the left-sided decompression.  This was a side where the large cyst was located.  The ligamentum flavum was significantly.  Adherent to the thecal sac.  I then began extending my laminotomy in order to create additional room to mobilize the cyst.  I ultimately was able to gently dissect and developed a plane between the thecal sac and the cyst.    The decompression allowed me to gently mobilize the thecal sac to the right and further develop a plane between the cyst and the thecal sac.  I mobilized the cyst laterally and ultimately was able to resect it.  As I resected the cyst I was able to develop a plane into the lateral recess to continue the medial facetectomy and foraminotomy.  At this point the S1 nerve root came into view and it was now completely decompressed.  The cyst was removed and sent for evaluation by the  pathologist.  Epidural veins were identified and coagulated with bipolar electrocautery.  At this point I was able to easily pass my Penfield 4 and nerve hook in the lateral recess out the S1 foramen.  Because of the significant lordosis at this level I continued my 5 laminotomy superiorly to prevent iatrogenic stenosis.  Once I felt that had an adequate central decompression as well as lateral  recess and foraminal decompression bilaterally I then moved to the kyphosis at the Davis frame provided.  With the kyphosis removed I still had adequate room under the L5 level and there was no junctional stenosis.  The wound was copiously irrigated with normal saline and I made sure hemostasis using bipolar cautery, topical TXA, and Floseal.  After final irrigation I used my nerve hook to confirm satisfactory decompression bilaterally in the lateral recess and into the foramen as well as centrally.  A thrombin-soaked Gelfoam patty was placed over the laminotomy defect and the retractor was removed.  The wound was then closed in a layered fashion with interrupted #1 Vicryl suture, 2-0 Vicryl suture, and 3-0 Monocryl for the skin.  Steri-Strips and dry dressings were applied and the patient was ultimately extubated transferred to PACU without incident.  End of the case all needle sponge counts were correct.  Assistant: Nelson Chimes, PA.  She was instrumental in assisting with positioning the patient, retraction visualization as well as wound closure.  Melina Schools, MD 07/24/2021 10:22 AM

## 2021-07-24 NOTE — Brief Op Note (Signed)
07/24/2021  10:31 AM  PATIENT:  [name]  70 y.o. male  PRE-OPERATIVE DIAGNOSIS:  left L5-S1 facet cyst  POST-OPERATIVE DIAGNOSIS:  left L5-S1 facet cyst  PROCEDURE:  Procedure(s): DECOMPRESSION AND REMOVAL OF CYST LUMBAR FIVE THROUGH SACRAL ONE (N/A)  SURGEON:  Surgeon(s) and Role:    Melina Schools, MD - Primary  PHYSICIAN ASSISTANT:   ASSISTANTS: Nelson Chimes, PA   ANESTHESIA:   general  EBL:  50 mL   BLOOD ADMINISTERED:none  DRAINS: none   LOCAL MEDICATIONS USED:  MARCAINE     SPECIMEN:  Source of Specimen:  left L5/S1 synovial cyst  DISPOSITION OF SPECIMEN:  PATHOLOGY  COUNTS:  YES  TOURNIQUET:  * No tourniquets in log *  DICTATION: .Dragon Dictation  PLAN OF CARE: Admit for overnight observation  PATIENT DISPOSITION:  PACU - hemodynamically stable.

## 2021-07-24 NOTE — Progress Notes (Signed)
Set Pt up on Cpap. Home settings of CPAP 15. Pt using home mask. No resp distress noted. Will continue to monitor.

## 2021-07-25 ENCOUNTER — Encounter (HOSPITAL_COMMUNITY): Payer: Self-pay | Admitting: Orthopedic Surgery

## 2021-07-25 DIAGNOSIS — I10 Essential (primary) hypertension: Secondary | ICD-10-CM | POA: Diagnosis not present

## 2021-07-25 DIAGNOSIS — M48062 Spinal stenosis, lumbar region with neurogenic claudication: Secondary | ICD-10-CM | POA: Diagnosis not present

## 2021-07-25 DIAGNOSIS — M7138 Other bursal cyst, other site: Secondary | ICD-10-CM | POA: Diagnosis not present

## 2021-07-25 DIAGNOSIS — Z85828 Personal history of other malignant neoplasm of skin: Secondary | ICD-10-CM | POA: Diagnosis not present

## 2021-07-25 DIAGNOSIS — Z86718 Personal history of other venous thrombosis and embolism: Secondary | ICD-10-CM | POA: Diagnosis not present

## 2021-07-25 DIAGNOSIS — G4730 Sleep apnea, unspecified: Secondary | ICD-10-CM | POA: Diagnosis not present

## 2021-07-25 LAB — SURGICAL PATHOLOGY

## 2021-07-25 MED FILL — Thrombin For Soln 20000 Unit: CUTANEOUS | Qty: 1 | Status: AC

## 2021-07-25 NOTE — Progress Notes (Signed)
Patient awaiting transport via wheelchair by volunteer for discharge home; in no acute distress nor complaints of pain nor discomfort; incision on his back with honeycomb dressing and is clean, dry and intact; room was checked and accounted for all his belongings; discharge instructions concerning his medications, incision care, follow up appointment and when to call the doctor as needed were all discussed with patient and his wife by RN and they both verbalized understanding on the instructions given.

## 2021-07-25 NOTE — Evaluation (Signed)
Occupational Therapy Evaluation Patient Details Name: [name] MRN: [id number] DOB: [birth year] Today's Date: 07/25/2021   History of Present Illness This 70 y.o. male admitted for lumbar decompression and removal of synovial cyst L5-S1.  PMH includes: arthritis, chronic back pain, h/o DVT, glaucoma, HTN, s/p bil. TKA, s/p Rt wrist fusion, s/p bil. TSA   Clinical Impression   Patient evaluated by Occupational Therapy with no further acute OT needs identified. All education has been completed and the patient has no further questions. Pt and wife instructed in back precautions and safety with ADLs.  Pt requires cues for precautions, but wife able to safely cue him.  She will assist him with LB ADLs.  See below for any follow-up Occupational Therapy or equipment needs. OT is signing off. Thank you for this referral.       Recommendations for follow up therapy are one component of a multi-disciplinary discharge planning process, led by the attending physician.  Recommendations may be updated based on patient status, additional functional criteria and insurance authorization.   Follow Up Recommendations  No OT follow up    Assistance Recommended at Discharge Intermittent Supervision/Assistance  Functional Status Assessment  Patient has had a recent decline in their functional status and demonstrates the ability to make significant improvements in function in a reasonable and predictable amount of time.  Equipment Recommendations  None recommended by OT    Recommendations for Other Services       Precautions / Restrictions Precautions Precautions: Back Precaution Booklet Issued: Yes (comment) Precaution Comments: reviewed back precautions pt requires cues to adhere to them Required Braces or Orthoses: Spinal Brace Spinal Brace: Lumbar corset;Applied in sitting position      Mobility Bed Mobility Overal bed mobility: Modified Independent                  Transfers                           Balance Overall balance assessment: Mild deficits observed, not formally tested                                         ADL either performed or assessed with clinical judgement   ADL Overall ADL's : Needs assistance/impaired Eating/Feeding: Independent   Grooming: Wash/dry hands;Wash/dry face;Oral care;Supervision/safety;Standing Grooming Details (indicate cue type and reason): reviewed safe technique for oral care Upper Body Bathing: Supervision/ safety;Sitting   Lower Body Bathing: Moderate assistance;Sit to/from stand   Upper Body Dressing : Supervision/safety;Sitting   Lower Body Dressing: Maximal assistance;Sit to/from stand   Toilet Transfer: Supervision/safety;Ambulation;Comfort height toilet   Toileting- Clothing Manipulation and Hygiene: Supervision/safety;Sit to/from stand       Functional mobility during ADLs: Supervision/safety General ADL Comments: Pt requires cues to adhere to back precautions as he moves quickly.  Wife is able to independently cue hm.  He is unable to perform figure 4 - wife will assist him with LB ADLs.  Reviewed how to don/doff brace - he requires min A and wife will assist.   Discussed use of reacher to home use     Vision Baseline Vision/History: 1 Wears glasses Ability to See in Adequate Light: 0 Adequate       Perception     Praxis      Pertinent Vitals/Pain Pain Assessment: Faces Faces Pain Scale: Hurts a little  bit Pain Location: back Pain Descriptors / Indicators: Operative site guarding     Hand Dominance Right   Extremity/Trunk Assessment Upper Extremity Assessment Upper Extremity Assessment: Overall WFL for tasks assessed   Lower Extremity Assessment Lower Extremity Assessment: Overall WFL for tasks assessed   Cervical / Trunk Assessment Cervical / Trunk Assessment: Back Surgery   Communication Communication Communication: HOH   Cognition Arousal/Alertness:  Awake/alert Behavior During Therapy: WFL for tasks assessed/performed Overall Cognitive Status: Within Functional Limits for tasks assessed                                 General Comments: Pt impulsive, but wife indicates this is baseline     General Comments       Exercises     Shoulder Instructions      Home Living Family/patient expects to be discharged to:: Private residence Living Arrangements: Spouse/significant other Available Help at Discharge: Family;Available 24 hours/day Type of Home: House Home Access: Stairs to enter [employer] of Steps: 4 Entrance Stairs-Rails: Can reach both;Left;Right Home Layout: One level     Bathroom Shower/Tub: Occupational psychologist: Handicapped height     Home Equipment: Conservation officer, nature (2 wheels);Cane - single point          Prior Functioning/Environment Prior Level of Function : Independent/Modified Independent;Driving                        OT Problem List: Decreased knowledge of use of DME or AE;Pain;Decreased safety awareness      OT Treatment/Interventions:      OT Goals(Current goals can be found in the care plan section) Acute Rehab OT Goals Patient Stated Goal: to go home today OT Goal Formulation: All assessment and education complete, DC therapy  OT Frequency:     Barriers to D/C:            Co-evaluation              AM-PAC OT "6 Clicks" Daily Activity     Outcome Measure Help from another person eating meals?: None Help from another person taking care of personal grooming?: A Little Help from another person toileting, which includes using toliet, bedpan, or urinal?: A Little Help from another person bathing (including washing, rinsing, drying)?: A Little Help from another person to put on and taking off regular upper body clothing?: A Little Help from another person to put on and taking off regular lower body clothing?: A Little 6 Click Score: 19    End of Session Equipment Utilized During Treatment: Back brace Nurse Communication: Mobility status  Activity Tolerance: Patient tolerated treatment well Patient left: in bed;with call bell/phone within reach;with family/visitor present (EOB)  OT Visit Diagnosis: Pain Pain - part of body:  (back)                Time: 0349-1791 OT Time Calculation (min): 22 min Charges:  OT General Charges $OT Visit: 1 Visit OT Evaluation $OT Eval Low Complexity: 1 Low  Nilsa Nutting., OTR/L Acute Rehabilitation Services Pager [phone] Office [phone]   Lucille Passy M 07/25/2021, 8:26 AM

## 2021-07-25 NOTE — Discharge Summary (Signed)
Patient ID: [name] MRN: [id number] DOB/AGE: June 10, 1951 70 y.o.  Admit date: 07/24/2021 Discharge date: 07/25/2021  Admission Diagnoses:  Principal Problem:   Lumbar spinal stenosis   Discharge Diagnoses:  Principal Problem:   Lumbar spinal stenosis  status post Procedure(s): DECOMPRESSION AND REMOVAL OF CYST LUMBAR FIVE THROUGH SACRAL ONE  Past Medical History:  Diagnosis Date   Arthritis    oa and ddd   Back pain, chronic    pt states he has 3 herniated disks--uses fentanyl patch for pain   Blood transfusion without reported diagnosis 1950   rH negative"at birth only"   BPH (benign prostatic hyperplasia)    frequent urination, not able to hold urine well   Cancer ([place])    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   Cataracts, both eyes    worst on left   Colon polyps    Complication of anesthesia    pt had spinal for a knee replacement--"woke up" during surgery--and sick after surgery   Depression    Diverticulosis    DVT (deep venous thrombosis) ([place]) 2001 or 2002   right leg-required hospitalization x 8 days   DVT (deep venous thrombosis) ([place]) 1996   right   Glaucoma    both eyes   H/O hiatal hernia    Hearing impaired person, bilateral    bilateral   Hemorrhoid    History of IBS    History of kidney stones    x 1-passed   Hyperlipidemia 2012   Hypertension    Lumbago    Lumbar post-laminectomy syndrome    PAF (paroxysmal atrial fibrillation) ([facility])    PONV (postoperative nausea and vomiting)    Sleep apnea    pt uses cpap - setting of 16   Ulcer 1966   Ulcerative colitis ([place])    Wears glasses    Wears hearing aid     Surgeries: Procedure(s): DECOMPRESSION AND REMOVAL OF CYST LUMBAR FIVE THROUGH SACRAL ONE on 07/24/2021   Consultants:   Discharged Condition: Improved  Hospital Course: [name] is an 70 y.o. male who was admitted 07/24/2021 for operative treatment of Lumbar spinal stenosis. Patient failed conservative treatments  (please see the history and physical for the specifics) and had severe unremitting pain that affects sleep, daily activities and work/hobbies. After pre-op clearance, the patient was taken to the operating room on 07/24/2021 and underwent  Procedure(s): DECOMPRESSION AND REMOVAL OF CYST LUMBAR FIVE THROUGH SACRAL ONE.    Patient was given perioperative antibiotics:  Anti-infectives (From admission, onward)    Start     Dose/Rate Route Frequency Ordered Stop   07/24/21 1545  ceFAZolin (ANCEF) IVPB 1 g/50 mL premix        1 g 100 mL/hr over 30 Minutes Intravenous Every 8 hours 07/24/21 1439 07/24/21 2352   07/24/21 0541  ceFAZolin (ANCEF) IVPB 2g/100 mL premix        2 g 200 mL/hr over 30 Minutes Intravenous 30 min pre-op 07/24/21 0541 07/24/21 0815        Patient was given sequential compression devices and early ambulation to prevent DVT.   Patient benefited maximally from hospital stay and there were no complications. At the time of discharge, the patient was urinating/moving their bowels without difficulty, tolerating a regular diet, pain is controlled with oral pain medications and they have been cleared by PT/OT.   Recent vital signs: Patient Vitals for the past 24 hrs:  BP Temp Temp src Pulse Resp SpO2  07/25/21  0809 129/76 98.4 F (36.9 C) Oral 83 18 94 %  07/25/21 0331 117/69 97.6 F (36.4 C) Oral 60 20 95 %  07/24/21 2322 -- -- -- 84 20 93 %  07/24/21 2316 127/69 98 F (36.7 C) Oral 71 20 91 %  07/24/21 1919 130/71 98.6 F (37 C) Oral 84 20 93 %  07/24/21 1621 131/76 97.7 F (36.5 C) Oral 75 18 (!) 88 %  07/24/21 1440 114/79 98.9 F (37.2 C) Oral 67 18 92 %  07/24/21 1330 133/72 -- -- 75 14 94 %  07/24/21 1300 131/73 -- -- 75 17 94 %  07/24/21 1230 123/69 -- -- 76 13 94 %     Recent laboratory studies: No results for input(s): WBC, HGB, HCT, PLT, NA, K, CL, CO2, BUN, CREATININE, GLUCOSE, INR, CALCIUM in the last 72 hours.  Invalid input(s): PT, 2   Discharge  Medications:   Allergies as of 07/25/2021       Reactions   Adhesive [tape] Other (See Comments)   Causes blisters - pls use paper tape   Other Other (See Comments)   "Adhesives"Pt allergic to some tapes--pt request that we only use paper tape!!        Medication List     STOP taking these medications    amoxicillin-clavulanate 875-125 MG tablet Commonly known as: AUGMENTIN   colchicine 0.6 MG tablet   multivitamin with minerals Tabs tablet       TAKE these medications    albuterol 108 (90 Base) MCG/ACT inhaler Commonly known as: VENTOLIN HFA Inhale 2 puffs into the lungs every 4 (four) hours as needed for wheezing or shortness of breath.   atorvastatin 40 MG tablet Commonly known as: LIPITOR Take 1 tablet by mouth once daily   CALCIUM + D PO Take 1 tablet by mouth at bedtime.   cholecalciferol 25 MCG (1000 UNIT) tablet Commonly known as: VITAMIN D3 Take 1,000 Units by mouth daily.   fentaNYL 25 MCG/HR Commonly known as: DURAGESIC Place 25 mcg onto the skin every 3 (three) days.   fluticasone 50 MCG/ACT nasal spray Commonly known as: FLONASE Place 1 spray into both nostrils at bedtime.   hydroxypropyl methylcellulose / hypromellose 2.5 % ophthalmic solution Commonly known as: ISOPTO TEARS / GONIOVISC Place 1 drop into both eyes 3 (three) times daily as needed for dry eyes.   levocetirizine 5 MG tablet Commonly known as: XYZAL TAKE 1 TABLET BY MOUTH ONCE DAILY IN THE EVENING   meclizine 25 MG tablet Commonly known as: ANTIVERT Take 1 tablet (25 mg total) by mouth 3 (three) times daily as needed for dizziness.   methocarbamol 500 MG tablet Commonly known as: Robaxin Take 1 tablet (500 mg total) by mouth every 8 (eight) hours as needed for up to 5 days for muscle spasms.   ondansetron 4 MG tablet Commonly known as: Zofran Take 1 tablet (4 mg total) by mouth every 8 (eight) hours as needed for nausea or vomiting.   PRESCRIPTION MEDICATION Inhale  into the lungs at bedtime. CPAP   sodium chloride 0.65 % Soln nasal spray Commonly known as: OCEAN Place 1 spray into both nostrils as needed for congestion.   traZODone 50 MG tablet Commonly known as: DESYREL Take 100 mg by mouth at bedtime.   vortioxetine HBr 10 MG Tabs tablet Commonly known as: Trintellix Take 1 tablet (10 mg total) by mouth at bedtime.   Xarelto 20 MG Tabs tablet Generic drug: rivaroxaban TAKE 1 TABLET BY  MOUTH ONCE DAILY WITH SUPPER   Xelpros 0.005 % Emul Generic drug: Latanoprost Place 1 drop into both eyes at bedtime.        Diagnostic Studies: DG Lumbar Spine 2-3 Views  Result Date: 07/24/2021 CLINICAL DATA:  Lumbar surgery, images to localize EXAM: LUMBAR SPINE - 2-3 VIEW COMPARISON:  None. FINDINGS: Multilevel degenerate disc disease of the lumbar spine with disc space narrowing and prominent osteophytes. There is hardware with a needle projecting at disc L5-S1. IMPRESSION: Multilevel degenerative disease of the lumbar spine. Electronically Signed   By: Keane Police D.O.   On: 07/24/2021 08:45    Discharge Instructions     Incentive spirometry RT   Complete by: As directed         Follow-up Information     Melina Schools, MD. Schedule an appointment as soon as possible for a visit in 2 week(s).   Specialty: Orthopedic Surgery Why: If symptoms worsen, For suture removal, For wound re-check Contact information: [address] [place] [address] [phone]                 Discharge Plan:  discharge to home  Disposition: Stable    Signed: Charlyne Petrin for [facility] PA-C [facility] [phone] 07/25/2021, 12:08 PM

## 2021-07-25 NOTE — Evaluation (Signed)
Physical Therapy Evaluation Patient Details Name: [name] MRN: [id number] DOB: 1950-09-27 Today's Date: 07/25/2021  History of Present Illness  This 70 y.o. male admitted for lumbar decompression and removal of synovial cyst L5-S1.  PMH includes: chronic back pain, h/o DVT, glaucoma, HTN, s/p bil. TKA, s/p Rt wrist fusion, s/p bil. TSA   Clinical Impression  Patient evaluated by Physical Therapy with no further acute PT needs identified. All education has been completed and the patient has no further questions. Pt was able to demonstrate transfers and ambulation with gross modified independence and no AD. Brace adjusted for optimal fit. Pt was educated on precautions, brace application/wearing schedule, appropriate activity progression, and car transfer. See below for any follow-up Physical Therapy or equipment needs. PT is signing off. Thank you for this referral.        Recommendations for follow up therapy are one component of a multi-disciplinary discharge planning process, led by the attending physician.  Recommendations may be updated based on patient status, additional functional criteria and insurance authorization.  Follow Up Recommendations No PT follow up    Assistance Recommended at Discharge PRN  Functional Status Assessment Patient has had a recent decline in their functional status and demonstrates the ability to make significant improvements in function in a reasonable and predictable amount of time.  Equipment Recommendations  None recommended by PT    Recommendations for Other Services       Precautions / Restrictions Precautions Precautions: Back;Fall Precaution Booklet Issued: Yes (comment) Precaution Comments: reviewed back precautions pt requires cues to adhere to them Required Braces or Orthoses: Spinal Brace Spinal Brace: Lumbar corset;Applied in sitting position      Mobility  Bed Mobility Overal bed mobility: Modified Independent              General bed mobility comments: Pt was received ambulating in the hall.    Transfers Overall transfer level: Modified independent Equipment used: None               General transfer comment: No assist required.    Ambulation/Gait Ambulation/Gait assistance: Modified independent (Device/Increase time) Gait Distance (Feet): 100 Feet Assistive device: None Gait Pattern/deviations: Step-through pattern;Decreased stride length;Trunk flexed Gait velocity: Decreased Gait velocity interpretation: <1.31 ft/sec, indicative of household ambulator   General Gait Details: Observed pt ambulating in the hall independently and then ambulated another ~100' with PT. No assist required. No unsteadiness noted however pt moving slow due to pain.  Stairs Stairs: Yes Stairs assistance: Modified independent (Device/Increase time) Stair Management: One rail Right;Alternating pattern;Forwards Number of Stairs: 10 General stair comments: VC's for sequencing and general safety. No assist required.  Wheelchair Mobility    Modified Rankin (Stroke Patients Only)       Balance Overall balance assessment: Mild deficits observed, not formally tested                                           Pertinent Vitals/Pain Pain Assessment: Faces Faces Pain Scale: Hurts a little bit Pain Location: back Pain Descriptors / Indicators: Operative site guarding;Sore Pain Intervention(s): Limited activity within patient's tolerance;Monitored during session;Repositioned    Home Living Family/patient expects to be discharged to:: Private residence Living Arrangements: Spouse/significant other Available Help at Discharge: Family;Available 24 hours/day Type of Home: House Home Access: Stairs to enter Entrance Stairs-Rails: Can reach both;Left;Right Entrance Stairs-Number of Steps: 4  Home Layout: One level Home Equipment: Conservation officer, nature (2 wheels);Cane - single point      Prior Function  Prior Level of Function : Independent/Modified Independent;Driving                     Hand Dominance   Dominant Hand: Right    Extremity/Trunk Assessment   Upper Extremity Assessment Upper Extremity Assessment: Defer to OT evaluation    Lower Extremity Assessment Lower Extremity Assessment: Generalized weakness (Mild; Consistent with pre-op diagnosis.)    Cervical / Trunk Assessment Cervical / Trunk Assessment: Back Surgery  Communication   Communication: HOH  Cognition Arousal/Alertness: Awake/alert Behavior During Therapy: WFL for tasks assessed/performed Overall Cognitive Status: Within Functional Limits for tasks assessed                                 General Comments: Pt impulsive, but wife indicates this is baseline        General Comments      Exercises     Assessment/Plan    PT Assessment Patient does not need any further PT services  PT Problem List         PT Treatment Interventions      PT Goals (Current goals can be found in the Care Plan section)  Acute Rehab PT Goals Patient Stated Goal: Home today PT Goal Formulation: All assessment and education complete, DC therapy    Frequency     Barriers to discharge        Co-evaluation               AM-PAC PT "6 Clicks" Mobility  Outcome Measure Help needed turning from your back to your side while in a flat bed without using bedrails?: None Help needed moving from lying on your back to sitting on the side of a flat bed without using bedrails?: None Help needed moving to and from a bed to a chair (including a wheelchair)?: None Help needed standing up from a chair using your arms (e.g., wheelchair or bedside chair)?: None Help needed to walk in hospital room?: None Help needed climbing 3-5 steps with a railing? : A Little 6 Click Score: 23    End of Session Equipment Utilized During Treatment: Gait belt;Back brace Activity Tolerance: Patient tolerated treatment  well Patient left: in bed;with call bell/phone within reach;with family/visitor present Nurse Communication: Mobility status PT Visit Diagnosis: Unsteadiness on feet (R26.81);Pain Pain - part of body:  (back)    Time: 3244-0102 PT Time Calculation (min) (ACUTE ONLY): 12 min   Charges:   PT Evaluation $PT Eval Low Complexity: 1 Low          Rolinda Roan, PT, DPT Acute Rehabilitation Services Pager: [phone] Office: [phone]   Thelma Comp 07/25/2021, 9:50 AM

## 2021-07-25 NOTE — Progress Notes (Signed)
Subjective: 1 Day Post-Op Procedure(s) (LRB): DECOMPRESSION AND REMOVAL OF CYST LUMBAR FIVE THROUGH SACRAL ONE (N/A) Patient reports pain as mild.   Leg pain improved. +ambulation +flatus, +void, feels like he is emptying bladder completely. Tolerating PO without nausea or vomiting.  No complaints.   Objective: Vital signs in last 24 hours: Temp:  [97 F (36.1 C)-98.9 F (37.2 C)] 97.6 F (36.4 C) (12/23 0331) Pulse Rate:  [60-84] 60 (12/23 0331) Resp:  [10-20] 20 (12/23 0331) BP: (114-133)/(69-79) 117/69 (12/23 0331) SpO2:  [88 %-95 %] 95 % (12/23 0331) FiO2 (%):  [21 %] 21 % (12/22 1108)  Intake/Output from previous day: 12/22 0701 - 12/23 0700 In: 1240 [P.O.:240; I.V.:1000] Out: 50 [Blood:50] Intake/Output this shift: No intake/output data recorded.  No results for input(s): HGB in the last 72 hours. No results for input(s): WBC, RBC, HCT, PLT in the last 72 hours. No results for input(s): NA, K, CL, CO2, BUN, CREATININE, GLUCOSE, CALCIUM in the last 72 hours. No results for input(s): LABPT, INR in the last 72 hours.  Neurologically intact ABD soft Neurovascular intact Sensation intact distally Intact pulses distally Dorsiflexion/Plantar flexion intact Incision: scant drainage No calf tenderness, swelling. Extremities warm and well profused.   Assessment/Plan: 1 Day Post-Op Procedure(s) (LRB): DECOMPRESSION AND REMOVAL OF CYST LUMBAR FIVE THROUGH SACRAL ONE (N/A) Advance diet Up with therapy Pain meds at homoe from pain managemnt. Will rx robaxin and zofran at discharge.  Encouraged IS DVT ppx: Teds, SCDs, ambulation. Will restart xarelto tomorrow.   Plan D/C today  Charlyne Petrin 07/25/2021, 8:06 AM

## 2021-08-14 NOTE — Progress Notes (Deleted)
Chronic Care Management Pharmacy Note  08/14/2021 Name:  [name] MRN:  [id number] DOB:  [birth year]  Subjective: [name] is an 71 y.o. year old male who is a primary patient of Pickard, Cammie Mcgee, MD.  The CCM team was consulted for assistance with disease management and care coordination needs.    Engaged with patient by telephone for follow up visit in response to provider referral for pharmacy case management and/or care coordination services.   Consent to Services:  The patient was given the following information about Chronic Care Management services today, agreed to services, and gave verbal consent: 1. CCM service includes personalized support from designated clinical staff supervised by the primary care provider, including individualized plan of care and coordination with other care providers 2. 24/7 contact phone numbers for assistance for urgent and routine care needs. 3. Service will only be billed when office clinical staff spend 20 minutes or more in a month to coordinate care. 4. Only one practitioner may furnish and bill the service in a calendar month. 5.The patient may stop CCM services at any time (effective at the end of the month) by phone call to the office staff. 6. The patient will be responsible for cost sharing (co-pay) of up to 20% of the service fee (after annual deductible is met). Patient agreed to services and consent obtained.  Patient Care Team: [name], MD as PCP - General (Family Medicine) [name], MD as PCP - Cardiology (Cardiology) Edythe Clarity, [facility] as Pharmacist (Pharmacist)  Recent office visits:  07/08/21 Jenna Luo, MD (PCP) - Family Medicine - Sialadenitis - Augmentin 875 BID for 10 days prescribed. Follow up if no improvement.    06/13/21 Jenna Luo, MD (PCP) - Family Medicine - Fever - Labs were ordered. amoxicillin-clavulanate (AUGMENTIN) 875-125 MG tablet take 1 tablet by mouth 2 (two) times daily  prescribed. Follow up in 1 week.    04/24/21 Jenna Luo, MD (PCP) - Family Medicine - Rhinosinusitis - levocetirizine (XYZAL ALLERGY 24HR) 5 MG tablet take 1 tablet (5 mg total) by mouth every evening prescribed. Complete the Augmentin but add Flonase 2 sprays each nostril daily. Flu vaccine administered. Follow up in 1 week    Recent consult visits:  None noted.    Hospital visits: 05/28/21   Medication Reconciliation was completed by comparing discharge summary, patients EMR and Pharmacy list, and upon discussion with patient.   Admitted to the hospital on 05/28/21 due to [place]. Discharge date was 05/28/21. Discharged from [facility] [place].     New?Medications Started at [facility] Discharge:?? Colchicine 0.6 MG tablet Take two tablets as one dose followed one hour later by one tablet prescribed.    Medication Changes at Hospital Discharge: No changes noted.    Medications Discontinued at Hospital Discharge: No medications were discontinued at discharge.   Medications that remain the same after Hospital Discharge:??  All other medications will remain the same.     Hospital visits: 04/17/21   Medication Reconciliation was completed by comparing discharge summary, patients EMR and Pharmacy list, and upon discussion with patient.   Admitted to the hospital on 04/17/21 due to Acute Sinusitis. Discharge date was 04/17/21. Discharged from [facility] [place].      New?Medications Started at [facility] Discharge:?? amoxicillin-clavulanate (AUGMENTIN) 875-125 MG tablet take 1 tablet by mouth every 12 (twelve) hours for 10  prescribed.    Medication Changes at Hospital Discharge: No changes noted.  Medications Discontinued at Hospital Discharge: No medications were discontinued at discharge.    Medications that remain the same after Hospital Discharge:??  All other medications will remain the same.    Objective:  Lab Results  Component  Value Date   CREATININE 1.07 07/21/2021   BUN 10 07/21/2021   GFR 80.20 01/01/2016   GFRNONAA >60 07/21/2021   GFRAA 76 07/11/2020   NA 138 07/21/2021   K 4.2 07/21/2021   CALCIUM 9.2 07/21/2021   CO2 26 07/21/2021   GLUCOSE 139 (H) 07/21/2021    Lab Results  Component Value Date/Time   HGBA1C 5.7 (H) 06/13/2021 02:17 PM   HGBA1C 5.1 07/19/2018 03:23 AM   GFR 80.20 01/01/2016 10:04 AM    Last diabetic Eye exam: No results found for: HMDIABEYEEXA  Last diabetic Foot exam: No results found for: HMDIABFOOTEX   Lab Results  Component Value Date   CHOL 165 07/11/2020   HDL 52 07/11/2020   LDLCALC 97 07/11/2020   TRIG 74 07/11/2020   CHOLHDL 3.2 07/11/2020    Hepatic Function Latest Ref Rng & Units 06/13/2021 07/11/2020 05/25/2019  Total Protein 6.1 - 8.1 g/dL 7.3 7.0 6.8  Albumin 3.5 - 5.0 g/dL - - -  AST 10 - 35 U/L 38(H) 23 25  ALT 9 - 46 U/L 44 28 38  Alk Phosphatase 38 - 126 U/L - - -  Total Bilirubin 0.2 - 1.2 mg/dL 0.8 0.8 0.8  Bilirubin, Direct 0.0 - 0.3 mg/dL - - -    Lab Results  Component Value Date/Time   TSH 3.90 07/11/2020 08:24 AM   TSH 3.48 05/25/2019 08:54 AM    CBC Latest Ref Rng & Units 07/21/2021 06/13/2021 07/11/2020  WBC 4.0 - 10.5 K/uL 5.3 6.8 5.3  Hemoglobin 13.0 - 17.0 g/dL 15.3 15.5 15.8  Hematocrit 39.0 - 52.0 % 43.8 45.3 45.7  Platelets 150 - 400 K/uL 172 220 169    No results found for: VD25OH  Clinical ASCVD: No  The 10-year ASCVD risk score (Arnett DK, et al., 2019) is: 19%   Values used to calculate the score:     Age: 44 years     Sex: Male     Is Non-Hispanic African American: No     Diabetic: No     Tobacco smoker: No     Systolic Blood Pressure: 169 mmHg     Is BP treated: No     HDL Cholesterol: 52 mg/dL     Total Cholesterol: 165 mg/dL    Depression screen [facility] 2/9 06/19/2021 07/16/2020 05/06/2020  Decreased Interest 0 0 0  Down, Depressed, Hopeless 0 0 0  PHQ - 2 Score 0 0 0  Altered sleeping - - -  Tired,  decreased energy - - -  Change in appetite - - -  Feeling bad or failure about yourself  - - -  Trouble concentrating - - -  Moving slowly or fidgety/restless - - -  Suicidal thoughts - - -  PHQ-9 Score - - -  Difficult doing work/chores - - -     Social History   Tobacco Use  Smoking Status Former   Packs/day: 1.00   Years: 19.00   Pack years: 19.00   Types: Cigarettes   Quit date: 08/04/1983   Years since quitting: 38.0  Smokeless Tobacco Never  Tobacco Comments   quit smoking 1985   BP Readings from Last 3 Encounters:  07/25/21 129/76  07/21/21 (!) 156/79  07/08/21 138/72  Pulse Readings from Last 3 Encounters:  07/25/21 83  07/21/21 84  07/08/21 72   Wt Readings from Last 3 Encounters:  07/24/21 233 lb (105.7 kg)  07/21/21 233 lb 14.4 oz (106.1 kg)  07/08/21 233 lb (105.7 kg)   BMI Readings from Last 3 Encounters:  07/24/21 36.49 kg/m  07/21/21 36.63 kg/m  07/08/21 35.95 kg/m    Assessment/Interventions: Review of patient past medical history, allergies, medications, health status, including review of consultants reports, laboratory and other test data, was performed as part of comprehensive evaluation and provision of chronic care management services.   SDOH:  (Social Determinants of Health) assessments and interventions performed: Yes   Financial Resource Strain: Low Risk    Difficulty of Paying Living Expenses: Not hard at all    SDOH Screenings   Alcohol Screen: Low Risk    Last Alcohol Screening Score (AUDIT): 1  Depression (PHQ2-9): Low Risk    PHQ-2 Score: 0  Financial Resource Strain: Low Risk    Difficulty of Paying Living Expenses: Not hard at all  Food Insecurity: No Food Insecurity   Worried About Charity fundraiser in the Last Year: Never true   Ran Out of Food in the Last Year: Never true  Housing: Low Risk    Last Housing Risk Score: 0  Physical Activity: Insufficiently Active   Days of Exercise per Week: 3 days   Minutes of  Exercise per Session: 20 min  Social Connections: Engineer, building services of Communication with Friends and Family: More than three times a week   Frequency of Social Gatherings with Friends and Family: More than three times a week   Attends Religious Services: 1 to 4 times per year   Active Member of [employer] or Organizations: Yes   Attends Music therapist: More than 4 times per year   Marital Status: Married  Stress: No Stress Concern Present   Feeling of Stress : Not at all  Tobacco Use: Medium Risk   Smoking Tobacco Use: Former   Smokeless Tobacco Use: Never   Passive Exposure: Not on Pensions consultant Needs: No Transportation Needs   Lack of Transportation (Medical): No   Lack of Transportation (Non-Medical): No    CCM Care Plan  Allergies  Allergen Reactions   Adhesive [Tape] Other (See Comments)    Causes blisters - pls use paper tape   Other Other (See Comments)    "Adhesives"Pt allergic to some tapes--pt request that we only use paper tape!!    Medications Reviewed Today     Reviewed by Erick Colace, CRNA (Certified Registered Nurse Anesthetist) on 07/24/21 at [phone]  Med List Status: Complete   Medication Order Taking? Sig Documenting Provider Last Dose Status Informant  albuterol (VENTOLIN HFA) 108 (90 Base) MCG/ACT inhaler [id number] Yes Inhale 2 puffs into the lungs every 4 (four) hours as needed for wheezing or shortness of breath. [name], MD Past Week Active Self  amoxicillin-clavulanate (AUGMENTIN) 875-125 MG tablet [id number] Yes Take 1 tablet by mouth 2 (two) times daily. [name], MD Past Week Active Self  atorvastatin (LIPITOR) 40 MG tablet [id number] Yes Take 1 tablet by mouth once daily [name], MD 07/23/2021 Active Self  Calcium Carbonate-Vitamin D (CALCIUM + D PO) [id number] Yes Take 1 tablet by mouth at bedtime.  [provider] 07/23/2021 Active Self  cholecalciferol (VITAMIN D3) 25 MCG (1000  UNIT) tablet [id number] Yes Take 1,000 Units by mouth daily. [provider] 07/23/2021 Active Self  colchicine 0.6 MG tablet [id number] No Take two tablets as one dose followed one hour later by one tablet.  Patient not taking: Reported on 07/15/2021   Vanessa Kick, MD More than a month Active Self  fentaNYL (DURAGESIC - DOSED MCG/HR) 25 MCG/HR [id number] Yes Place 25 mcg onto the skin every 3 (three) days.  [provider] 07/24/2021 Active Self           Med Note Vladimir Creeks, CHRISTINA H   Mon May 15, 2019 10:06 AM)    fluticasone (FLONASE) 50 MCG/ACT nasal spray [id number] Yes Place 1 spray into both nostrils at bedtime. [provider] 07/23/2021 Active Self  hydroxypropyl methylcellulose / hypromellose (ISOPTO TEARS / GONIOVISC) 2.5 % ophthalmic solution [id number] Yes Place 1 drop into both eyes 3 (three) times daily as needed for dry eyes. [provider] 07/23/2021 Active Self  Latanoprost (XELPROS) 0.005 % EMUL [id number] Yes Place 1 drop into both eyes at bedtime. [provider] 07/23/2021 Active Self  levocetirizine (XYZAL) 5 MG tablet [id number] Yes TAKE 1 TABLET BY MOUTH ONCE DAILY IN THE Reita Chard, MD 07/23/2021 Active Self  meclizine (ANTIVERT) 25 MG tablet [id number] No Take 1 tablet (25 mg total) by mouth 3 (three) times daily as needed for dizziness. [name], MD More than a month Active Self  Multiple Vitamin (MULTIVITAMIN WITH MINERALS) TABS tablet [id number] Yes Take 1 tablet by mouth at bedtime.  [provider] Past Week Active Self  PRESCRIPTION MEDICATION [id number]  Inhale into the lungs at bedtime. CPAP [provider]  Active Self  rivaroxaban (XARELTO) 20 MG TABS tablet [id number] Yes TAKE 1 TABLET BY MOUTH ONCE DAILY WITH SUPPER [name], MD Past Week Active Self  sodium chloride (OCEAN) 0.65 % SOLN nasal spray [id number] Yes Place 1 spray into both nostrils as needed for congestion.  Eulogio Bear, NP 07/23/2021 Active Self  traZODone (DESYREL) 50 MG tablet [id number] Yes Take 100 mg by mouth at bedtime. [provider] 07/23/2021 Active Self  vortioxetine HBr (TRINTELLIX) 10 MG TABS tablet [id number] Yes Take 1 tablet (10 mg total) by mouth at bedtime. [name], MD 07/23/2021 Active Self  Med List Note Daune Perch, NP 08/06/18 1403):              Patient Active Problem List   Diagnosis Date Noted   Lumbar spinal stenosis 07/24/2021   Nonocclusive coronary atherosclerosis of native coronary artery 10/08/2019   Former smoker 09/26/2019   Cough 09/28/2018   Nasal congestion 09/28/2018   Paroxysmal atrial fibrillation ([place]) 08/25/2018   Cellulitis 07/18/2018   Essential hypertension 07/18/2018   Hyperlipidemia 07/18/2018   Allergic reaction 07/18/2018   Atrial fibrillation with RVR ([place]) 07/18/2018   H/O total shoulder replacement, left 07/15/2018   S/P shoulder replacement, right 01/28/2018   Robotic XI TAPP  bilateral inguinal hernia repair Dec 2017 07/31/2016   S/P bilateral inguinal herniorrhaphy 07/31/2016   Lumbar post-laminectomy syndrome    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   OA (osteoarthritis) of knee 11/17/2013   OSA (obstructive sleep apnea) 12/05/2012   GERD 12/23/2007   RLQ PAIN 12/23/2007   PERSONAL HX COLONIC POLYPS 12/23/2007    Immunization History  Administered Date(s) Administered   Fluad Quad(high Dose 65+) 04/06/2019, 05/01/2020, 04/24/2021   Influenza Split 07/04/2012, 03/03/2013   Influenza,inj,Quad PF,6+ Mos 05/03/2014, 04/30/2015, 04/03/2016, 04/08/2017, 04/11/2018   Influenza,inj,quad, With Preservative 05/12/2017   Moderna Sars-Covid-2 Vaccination 09/14/2019, 06/06/2020  PFIZER(Purple Top)SARS-COV-2 Vaccination 10/13/2019   Pneumococcal Conjugate-13 05/03/2014   Pneumococcal Polysaccharide-23 08/03/2008, 04/03/2016   Tdap 08/03/2010   Zoster, Live 08/03/2010    Conditions to be  addressed/monitored:  HTN, HLD, Afib  There are no care plans that you recently modified to display for this patient.     Medication Assistance: Application for [employer]   medication assistance program. in process.  Anticipated assistance start date unknown at this time.  Application faxed 76/81/15.  See plan of care for additional detail.  Patient's preferred pharmacy is:  [facility] [address] [place] [address] [place] [address] Phone: [phone] Fax: [phone]  [facility] [address] [place] [address] Phone: [phone] Fax: [phone]  Uses pill box? Yes Pt endorses 100% compliance  We discussed: Benefits of medication synchronization, packaging and delivery as well as enhanced pharmacist oversight with [employer]. Patient decided to: Continue current medication management strategy  Care Plan and Follow Up Patient Decision:  Patient agrees to Care Plan and Follow-up.  Plan: The care management team will reach out to the patient again over the next 180 days.  Beverly Milch, PharmD, CPP Clinical Pharmacist Practitioner [facility] [phone]   Current Barriers:  Unable to independently afford treatment regimen  Pharmacist Clinical Goal(s):  Patient will achieve adherence to monitoring guidelines and medication adherence to achieve therapeutic efficacy maintain control of blood pressure as evidenced by home monitoring  contact provider office for questions/concerns as evidenced notation of same in electronic health record through collaboration with PharmD and provider.   Interventions: 1:1 collaboration with [name], MD regarding development and update of comprehensive plan of care as evidenced by provider attestation and co-signature Inter-disciplinary care team collaboration (see longitudinal plan of care) Comprehensive medication review  performed; medication list updated in electronic medical record  Hypertension (BP goal <140/90) -Controlled -Current treatment: None -Medications previously tried: metoprolol tartrate -Current home readings: 112/60s -Current exercise habits: patient is active, walking daily -Denies hypotensive/hypertensive symptoms -Educated on BP goals and benefits of medications for prevention of heart attack, stroke and kidney damage; Exercise goal of 150 minutes per week; Importance of home blood pressure monitoring; Symptoms of hypotension and importance of maintaining adequate hydration; -Counseled to monitor BP at home a few times per week, document, and provide log at future appointments -Recommended to continue current medication  Hyperlipidemia: (LDL goal < 70) -Not ideally controlled -Current treatment: Atorvastatin 26m daily -Medications previously tried: none noted  -Educated on Cholesterol goals;  Benefits of statin for ASCVD risk reduction; Importance of limiting foods high in cholesterol; Strategies to manage statin-induced myalgias; -Recommended to continue current medication  Atrial Fibrillation (Goal: prevent stroke and major bleeding) -Controlled  -Current treatment: Rate control: none Anticoagulation: Xarelto 261mdaily with SUPPER -Medications previously tried: metoprolol prn -Home BP and HR readings: controlled  -Counseled on increased risk of stroke due to Afib and benefits of anticoagulation for stroke prevention; importance of adherence to anticoagulant exactly as prescribed; bleeding risk associated with Xarelto and importance of self-monitoring for signs/symptoms of bleeding; -Recommended to continue current medication Assessed patient finances. Reports Xarelto copay is high especially when in donut hole, will research based on income level to see if he qualifies.   Patient Goals/Self-Care Activities Patient will:  - take medications as prescribed check blood  pressure weekly, document, and provide at future appointments collaborate with provider on medication access solutions target a minimum of  150 minutes of moderate intensity exercise weekly  Follow Up Plan: The care management team will reach out to the patient again over the next 180 days.

## 2021-08-28 ENCOUNTER — Telehealth: Payer: Medicare Other

## 2021-08-28 DIAGNOSIS — M5451 Vertebrogenic low back pain: Secondary | ICD-10-CM | POA: Diagnosis not present

## 2021-09-08 DIAGNOSIS — H401131 Primary open-angle glaucoma, bilateral, mild stage: Secondary | ICD-10-CM | POA: Diagnosis not present

## 2021-09-12 ENCOUNTER — Ambulatory Visit (INDEPENDENT_AMBULATORY_CARE_PROVIDER_SITE_OTHER): Payer: Medicare HMO | Admitting: Family Medicine

## 2021-09-12 ENCOUNTER — Other Ambulatory Visit: Payer: Self-pay

## 2021-09-12 ENCOUNTER — Encounter: Payer: Self-pay | Admitting: Family Medicine

## 2021-09-12 VITALS — BP 142/78 | HR 68 | Temp 97.5°F | Resp 18 | Wt 235.0 lb

## 2021-09-12 DIAGNOSIS — K1120 Sialoadenitis, unspecified: Secondary | ICD-10-CM

## 2021-09-12 MED ORDER — DOXYCYCLINE HYCLATE 100 MG PO TABS: 100.0000 mg | ORAL_TABLET | Freq: Two times a day (BID) | ORAL | 0 refills | Status: DC

## 2021-09-12 NOTE — Progress Notes (Signed)
Subjective:    Patient ID: [name], male    DOB: 10/07/1950, 71 y.o.   MRN: [id number]  2 months ago, I treated the patient for sialadenitis near the left parotid gland.  The swelling and erythema went down and resolved quickly.  Patient recently got a haircut.  He has an infected scab on the posterior aspect of his neck that is erythematous and inflamed and is about 1 cm in diameter.  There is now a soft tissue swelling at the angle of his left mandible.  I believe that this is the inferior portion of the parotid gland.  It is swollen and tender and erythematous and warm similar to the last time I saw him.  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   Cataracts, both eyes    worst on left   Colon polyps    Complication of anesthesia    pt had spinal for a knee replacement--"woke up" during surgery--and sick after surgery   Depression    Diverticulosis    DVT (deep venous thrombosis) ([place]) 2001 or 2002   right leg-required hospitalization x 8 days   DVT (deep venous thrombosis) ([place]) 1996   right   Glaucoma    both eyes   H/O hiatal hernia    Hearing impaired person, bilateral    bilateral   Hemorrhoid    History of IBS    History of kidney stones    x 1-passed   Hyperlipidemia 2012   Hypertension    Lumbago    Lumbar post-laminectomy syndrome    PAF (paroxysmal atrial fibrillation) (HCC)    PONV (postoperative nausea and vomiting)    Sleep apnea    pt uses cpap - setting of 16   Ulcer 1966   Ulcerative colitis ([place])    Wears glasses    Wears hearing aid    Past Surgical History:   Procedure Laterality Date   APPENDECTOMY  1993   BACK SURGERY  2008   bone spur removed    bilateral carpal tunnel release 2009     cervical fusion 2011--pt has good range of motion neck     CHOLECYSTECTOMY  1994   EYE SURGERY N/A    Phreesia 07/13/2020   INSERTION OF MESH Bilateral 07/31/2016   Procedure: INSERTION OF MESH;  Surgeon: [name], MD;  Location: [facility];  Service: General;  Laterality: Bilateral;   JOINT REPLACEMENT Bilateral    2001 left total knee and 1985 right total knee   LUMBAR LAMINECTOMY/DECOMPRESSION MICRODISCECTOMY N/A 07/24/2021   Procedure: DECOMPRESSION AND REMOVAL OF CYST LUMBAR FIVE THROUGH SACRAL ONE;  Surgeon: Melina Schools, MD;  Location: [place];  Service: Orthopedics;  Laterality: N/A;   MASS EXCISION Left 01/04/2014   Procedure: LEFT MIDDLE FINGER MASS EXCISION WITH NAILBED REPAIR AND ADVANCEMENT FLAP;  Surgeon: [name], MD;  Location: [facility];  Service: Orthopedics;  Laterality: Left;   mass removed from 2nd finger Left 03/2013   squamous carcinoma   nissen fundoplication 9678  PROSTATE SURGERY  2014   TURP   right wrist fusion 12/12 - pt wearing brace on the wrist-not started physical therapy yet Right    fusion prevent limited ROM to right wrist   SPINE SURGERY  2009   cervical spine fusion   thyroid nodule     removed   TOTAL KNEE REVISION Right 11/17/2013   Procedure: RIGHT KNEE POLYETHYLENE REVISION, bone graft;  Surgeon: [name], MD;  Location: [facility];  Service: Orthopedics;  Laterality: Right;   TOTAL SHOULDER ARTHROPLASTY Right 01/28/2018   Procedure: RIGHT TOTAL SHOULDER ARTHROPLASTY;  Surgeon: [name], MD;  Location: [facility];  Service: Orthopedics;  Laterality: Right;   TOTAL SHOULDER ARTHROPLASTY Left 07/15/2018   Procedure: LEFT SHOULDER ANATOMIC TOTAL SHOULDER ARTHROPLASTY;  Surgeon: [name], MD;  Location: [facility];  Service: Orthopedics;  Laterality: Left;   TRANSURETHRAL RESECTION OF  PROSTATE  10/09/2011   Procedure: TRANSURETHRAL RESECTION OF THE PROSTATE WITH GYRUS INSTRUMENTS;  Surgeon: Fredricka Bonine, MD;  Location[facility];  Service: Urology;  Laterality: N/A;   uvvvp     XI ROBOTIC ASSISTED INGUINAL HERNIA REPAIR WITH MESH Bilateral 07/31/2016   Procedure: XI ROBOTIC BILATERAL INGUINAL HERNIA REPAIR WITH MESH;  Surgeon: [name], MD;  [facility];  Service: General;  Laterality: Bilateral;   Current Outpatient Medications on File Prior to Visit  Medication Sig Dispense Refill   albuterol (VENTOLIN HFA) 108 (90 Base) MCG/ACT inhaler Inhale 2 puffs into the lungs every 4 (four) hours as needed for wheezing or shortness of breath. 18 g 2   atorvastatin (LIPITOR) 40 MG tablet Take 1 tablet by mouth once daily 90 tablet 0   Calcium Carbonate-Vitamin D (CALCIUM + D PO) Take 1 tablet by mouth at bedtime.      cholecalciferol (VITAMIN D3) 25 MCG (1000 UNIT) tablet Take 1,000 Units by mouth daily.     fentaNYL (DURAGESIC - DOSED MCG/HR) 25 MCG/HR Place 25 mcg onto the skin every 3 (three) days.      fluticasone (FLONASE) 50 MCG/ACT nasal spray Place 1 spray into both nostrils at bedtime.     hydroxypropyl methylcellulose / hypromellose (ISOPTO TEARS / GONIOVISC) 2.5 % ophthalmic solution Place 1 drop into both eyes 3 (three) times daily as needed for dry eyes.     Latanoprost (XELPROS) 0.005 % EMUL Place 1 drop into both eyes at bedtime.     levocetirizine (XYZAL) 5 MG tablet TAKE 1 TABLET BY MOUTH ONCE DAILY IN THE EVENING 90 tablet 2   meclizine (ANTIVERT) 25 MG tablet Take 1 tablet (25 mg total) by mouth 3 (three) times daily as needed for dizziness. 30 tablet 0   PRESCRIPTION MEDICATION Inhale into the lungs at bedtime. CPAP     rivaroxaban (XARELTO) 20 MG TABS tablet TAKE 1 TABLET BY MOUTH ONCE DAILY WITH SUPPER 90 tablet 1   sodium chloride (OCEAN) 0.65 % SOLN nasal spray Place 1 spray into both nostrils as needed for congestion. 88 mL 0   traZODone  (DESYREL) 50 MG tablet Take 100 mg by mouth at bedtime.     vortioxetine HBr (TRINTELLIX) 10 MG TABS tablet Take 1 tablet (10 mg total) by mouth at bedtime. 30 tablet 5   No current facility-administered medications on file prior to visit.   Allergies  Allergen Reactions   Adhesive [Tape] Other (See Comments)    Causes blisters - pls use paper tape   Other Other (See Comments)    "Adhesives"Pt allergic  to some tapes--pt request that we only use paper tape!!   Social History   Socioeconomic History   Marital status: Married    Spouse name: Opal Sidles   Number of children: 1   Years of education: Not on file   Highest education level: Not on file  Occupational History   Occupation: disabled  Tobacco Use   Smoking status: Former    Packs/day: 1.00    Years: 19.00    Pack years: 19.00    Types: Cigarettes    Quit date: 08/04/1983    Years since quitting: 38.1   Smokeless tobacco: Never   Tobacco comments:    quit smoking 1985  Vaping Use   Vaping Use: Never used  Substance and Sexual Activity   Alcohol use: Yes    Alcohol/week: 6.0 standard drinks    Types: 6 Cans of beer per week    Comment: maybe 2 or 3 beers per week   Drug use: No   Sexual activity: Not Currently  Other Topics Concern   Not on file  Social History Narrative   Married x 3 years in 2021.   1 daughter, 1 grand son and 1 grand daughter   Social Determinants of Radio broadcast assistant Strain: Low Risk    Difficulty of Paying Living Expenses: Not hard at all  Food Insecurity: No Food Insecurity   Worried About Charity fundraiser in the Last Year: Never true   Arboriculturist in the Last Year: Never true  Transportation Needs: No Transportation Needs   Lack of Transportation (Medical): No   Lack of Transportation (Non-Medical): No  Physical Activity: Insufficiently Active   Days of Exercise per Week: 3 days   Minutes of Exercise per Session: 20 min  Stress: No Stress Concern Present   Feeling of  Stress : Not at all  Social Connections: Socially Integrated   Frequency of Communication with Friends and Family: More than three times a week   Frequency of Social Gatherings with Friends and Family: More than three times a week   Attends Religious Services: 1 to 4 times per year   Active Member of [employer] or Organizations: Yes   Attends Music therapist: More than 4 times per year   Marital Status: Married  Human resources officer Violence: Not At Risk   Fear of Current or Ex-Partner: No   Emotionally Abused: No   Physically Abused: No   Sexually Abused: No   Family History  Problem Relation Age of Onset   Colon cancer Father    Bone cancer Father        deceased   Cancer Father    Diabetes Father    Prostate cancer Father    Arthritis Mother    Hearing loss Mother    Hyperlipidemia Mother    Hypertension Mother    Deep vein thrombosis Mother    Diabetes Sister    Hearing loss Maternal Grandmother    Diabetes Paternal Grandfather      Review of Systems  Neurological:  Positive for dizziness.  All other systems reviewed and are negative.     Objective:   Physical Exam Vitals reviewed.  Constitutional:      General: He is not in acute distress.    Appearance: He is well-developed. He is not diaphoretic.  HENT:     Head: Normocephalic and atraumatic.      Right Ear: Tympanic membrane, ear canal and external ear normal.  Left Ear: Tympanic membrane, ear canal and external ear normal.     Mouth/Throat:     Pharynx: No oropharyngeal exudate or posterior oropharyngeal erythema.  Eyes:     General: No scleral icterus.       Right eye: No discharge.        Left eye: No discharge.     Conjunctiva/sclera: Conjunctivae normal.     Pupils: Pupils are equal, round, and reactive to light.  Neck:     Thyroid: No thyromegaly.     Vascular: No JVD.     Trachea: No tracheal deviation.   Cardiovascular:     Rate and Rhythm: Normal rate and regular rhythm.      Heart sounds: Normal heart sounds. No murmur heard.   No friction rub. No gallop.  Pulmonary:     Effort: Pulmonary effort is normal. No respiratory distress.     Breath sounds: No stridor. No wheezing, rhonchi or rales.  Chest:     Chest wall: No tenderness.  Abdominal:     General: Bowel sounds are normal. There is no distension.     Palpations: Abdomen is soft. There is no mass.     Tenderness: There is no abdominal tenderness. There is no guarding or rebound.  Musculoskeletal:     Cervical back: Normal range of motion and neck supple.  Lymphadenopathy:     Cervical: Cervical adenopathy present.     Left cervical: Superficial cervical adenopathy present.  Skin:    General: Skin is warm.     Findings: Erythema present.  Neurological:     Mental Status: He is alert and oriented to person, place, and time.     Motor: No abnormal muscle tone.     Deep Tendon Reflexes: Reflexes are normal and symmetric.          Assessment & Plan:  Sialadenitis I am going to cover for staph given the appearance of the infected scab on the posterior neck.  Begin doxycycline 100 mg p.o. twice daily for 10 days.  I am also going to obtain a CT scan given the recurrent nature of this infection to rule out underlying abnormalities that may predispose the patient to this is the second time he has had swollen lymph nodes and erythema in the left side of his neck and at the angle of the mandible

## 2021-09-15 DIAGNOSIS — Z5181 Encounter for therapeutic drug level monitoring: Secondary | ICD-10-CM | POA: Diagnosis not present

## 2021-09-15 DIAGNOSIS — G4700 Insomnia, unspecified: Secondary | ICD-10-CM | POA: Diagnosis not present

## 2021-09-15 DIAGNOSIS — G894 Chronic pain syndrome: Secondary | ICD-10-CM | POA: Diagnosis not present

## 2021-09-15 DIAGNOSIS — M961 Postlaminectomy syndrome, not elsewhere classified: Secondary | ICD-10-CM | POA: Diagnosis not present

## 2021-09-15 DIAGNOSIS — Z79891 Long term (current) use of opiate analgesic: Secondary | ICD-10-CM | POA: Diagnosis not present

## 2021-09-22 ENCOUNTER — Telehealth: Payer: Self-pay | Admitting: Pharmacist

## 2021-09-22 NOTE — Progress Notes (Signed)
° ° °  Chronic Care Management Pharmacy Assistant   Name: [name]  MRN: [id number] DOB: Nov 17, 1950   Reason for Encounter: Reschedule missed visit with CPP in January   Called and LM for patient to return call to reschduel missed visit with CPP in January. Asked patient to return call to reschedule visit.     Jobe Gibbon, [facility] Clinical Pharmacist Assistant  [phone]

## 2021-10-08 ENCOUNTER — Other Ambulatory Visit: Payer: Self-pay

## 2021-10-08 ENCOUNTER — Ambulatory Visit
Admission: RE | Admit: 2021-10-08 | Discharge: 2021-10-08 | Disposition: A | Discharge status: Home / Self Care | Payer: Medicare HMO | Source: Ambulatory Visit | Attending: Family Medicine | Admitting: Family Medicine

## 2021-10-08 DIAGNOSIS — K1120 Sialoadenitis, unspecified: Secondary | ICD-10-CM | POA: Diagnosis not present

## 2021-10-08 MED ORDER — IOPAMIDOL (ISOVUE-300) INJECTION 61%
80.0000 mL | Freq: Once | INTRAVENOUS | Status: AC | PRN
  Administered 2021-10-08: 80 mL via INTRAVENOUS

## 2021-10-15 DIAGNOSIS — M79645 Pain in left finger(s): Secondary | ICD-10-CM | POA: Diagnosis not present

## 2021-10-20 ENCOUNTER — Encounter: Payer: Self-pay | Admitting: Family Medicine

## 2021-10-20 MED ORDER — ATORVASTATIN CALCIUM 40 MG PO TABS: 40.0000 mg | ORAL_TABLET | Freq: Every day | ORAL | 3 refills | Status: DC

## 2021-10-22 DIAGNOSIS — M1812 Unilateral primary osteoarthritis of first carpometacarpal joint, left hand: Secondary | ICD-10-CM | POA: Diagnosis not present

## 2021-10-22 DIAGNOSIS — S63602A Unspecified sprain of left thumb, initial encounter: Secondary | ICD-10-CM | POA: Diagnosis not present

## 2021-11-14 DIAGNOSIS — H9202 Otalgia, left ear: Secondary | ICD-10-CM | POA: Diagnosis not present

## 2021-11-14 DIAGNOSIS — R221 Localized swelling, mass and lump, neck: Secondary | ICD-10-CM | POA: Diagnosis not present

## 2021-12-01 DIAGNOSIS — H401131 Primary open-angle glaucoma, bilateral, mild stage: Secondary | ICD-10-CM | POA: Diagnosis not present

## 2021-12-02 ENCOUNTER — Other Ambulatory Visit: Payer: Self-pay | Admitting: Cardiology

## 2021-12-02 NOTE — Telephone Encounter (Signed)
Prescription refill request for Xarelto received.  ?Indication:afib ?Last office visit:christopher 04/08/21 ?Weight:106.6kg ?Age:1m?Scr:1.07 07/21/21 ?CrCl:43 ? ?

## 2021-12-30 ENCOUNTER — Telehealth: Payer: Self-pay

## 2021-12-30 NOTE — Telephone Encounter (Signed)
I will put an application in the mail for him!

## 2021-12-30 NOTE — Telephone Encounter (Signed)
Pt called in wanting to get info about filling out another application for vortioxetine HBr (TRINTELLIX) 10 MG. Pt states that he is almost out of this med, and would like to complete an application for assistance before he runs out.   Cb#: [phone]

## 2022-01-02 DIAGNOSIS — Z4889 Encounter for other specified surgical aftercare: Secondary | ICD-10-CM | POA: Diagnosis not present

## 2022-01-12 ENCOUNTER — Telehealth: Payer: Self-pay

## 2022-01-12 NOTE — Telephone Encounter (Signed)
Pt came in to drop off forms to be signed by pcp for patient assistance for a med. Form placed in nurse's folder for review. Please call pt when form has been faxed. Please advise.  Cb#: [phone]

## 2022-01-14 NOTE — Telephone Encounter (Signed)
Forms are completed attached w/med list. Given to front office to call pt to pick up form

## 2022-01-19 DIAGNOSIS — Z79891 Long term (current) use of opiate analgesic: Secondary | ICD-10-CM | POA: Diagnosis not present

## 2022-01-19 DIAGNOSIS — G894 Chronic pain syndrome: Secondary | ICD-10-CM | POA: Diagnosis not present

## 2022-01-19 DIAGNOSIS — M961 Postlaminectomy syndrome, not elsewhere classified: Secondary | ICD-10-CM | POA: Diagnosis not present

## 2022-01-28 ENCOUNTER — Telehealth: Payer: Self-pay

## 2022-01-28 NOTE — Telephone Encounter (Signed)
[facility] P[facility] fax stating that pt is now enrolled in the program and eligible to receive medication at no cost through the program until 08/02/22

## 2022-02-27 ENCOUNTER — Other Ambulatory Visit: Payer: Self-pay | Admitting: Family Medicine

## 2022-02-27 NOTE — Telephone Encounter (Signed)
Requested Prescriptions  Pending Prescriptions Disp Refills  . levocetirizine (XYZAL) 5 MG tablet [Pharmacy Med Name: LEVOCETIRIZINE 5MG TABLETS] 90 tablet 1    Sig: TAKE 1 TABLET BY MOUTH EVERY EVENING     Ear, Nose, and Throat:  Antihistamines - levocetirizine dihydrochloride Passed - 02/27/2022  4:09 AM      Passed - Cr in normal range and within 360 days    Creat  Date Value Ref Range Status  06/13/2021 1.15 0.70 - 1.28 mg/dL Final   Creatinine, Ser  Date Value Ref Range Status  07/21/2021 1.07 0.61 - 1.24 mg/dL Final         Passed - eGFR is 10 or above and within 360 days    GFR, Est African American  Date Value Ref Range Status  07/11/2020 76 > OR = 60 mL/min/1.71m Final   GFR, Est Non African American  Date Value Ref Range Status  07/11/2020 66 > OR = 60 mL/min/1.744mFinal   GFR, Estimated  Date Value Ref Range Status  07/21/2021 >60 >60 mL/min Final    Comment:    (NOTE) Calculated using the CKD-EPI Creatinine Equation (2021)    GFR  Date Value Ref Range Status  01/01/2016 80.20 >60.00 mL/min Final   eGFR  Date Value Ref Range Status  06/13/2021 68 > OR = 60 mL/min/1.7357minal    Comment:    The eGFR is based on the CKD-EPI 2021 equation. To calculate  the new eGFR from a previous Creatinine or Cystatin C result, go to https://www.kidney.org/professionals/ kdoqi/gfr%5Fcalculator          Passed - Valid encounter within last 12 months    Recent Outpatient Visits          5 months ago [place], [facility]gee[facility]   7 months ago [facility]   8 months ago Fever, unspecified fever cause   [place], War[place]ammie McgeeD   10 months ago [place] [place] Mcgee[place]   1 year ago Upper respiratory tract infection, unspecified type   [facility]      Future  Appointments            In 1 week [facility]

## 2022-03-09 ENCOUNTER — Ambulatory Visit: Payer: Medicare HMO | Admitting: Pulmonary Disease

## 2022-03-09 ENCOUNTER — Ambulatory Visit (HOSPITAL_COMMUNITY)
Admission: RE | Admit: 2022-03-09 | Discharge: 2022-03-09 | Disposition: A | Discharge status: Home / Self Care | Payer: Medicare HMO | Source: Ambulatory Visit | Attending: Pulmonary Disease | Admitting: Pulmonary Disease

## 2022-03-09 ENCOUNTER — Encounter: Payer: Self-pay | Admitting: Pulmonary Disease

## 2022-03-09 VITALS — BP 132/78 | HR 64 | Temp 98.4°F | Ht 67.0 in | Wt 237.2 lb

## 2022-03-09 DIAGNOSIS — R059 Cough, unspecified: Secondary | ICD-10-CM | POA: Diagnosis not present

## 2022-03-09 DIAGNOSIS — R053 Chronic cough: Secondary | ICD-10-CM | POA: Insufficient documentation

## 2022-03-09 DIAGNOSIS — G4733 Obstructive sleep apnea (adult) (pediatric): Secondary | ICD-10-CM | POA: Diagnosis not present

## 2022-03-09 MED ORDER — BUDESONIDE-FORMOTEROL FUMARATE 80-4.5 MCG/ACT IN AERO: 2.0000 | INHALATION_SPRAY | Freq: Two times a day (BID) | RESPIRATORY_TRACT | 12 refills | Status: DC

## 2022-03-09 NOTE — Patient Instructions (Signed)
Will have [employer] arrange for a new auto CPAP machine.  Lab tests today.  Chest xray at [facility].  Symbicort two puffs in the morning and two puffs in the evening, and rinse your mouth after each use.  Albuterol two puffs every 6 hours as needed for cough, wheeze, or chest congestion.  Will arrange for pulmonary function test in [facility] office and follow up with Dr. Halford Chessman or Nurse Practitioner in 6 weeks.

## 2022-03-09 NOTE — Progress Notes (Signed)
[facility]  Chief Complaint  Patient presents with   Follow-up    Cpap working well     Constitutional:  BP 132/78 (BP Location: Left Arm, Patient Position: Sitting)   Pulse 64   Temp 98.4 F (36.9 C) (Temporal)   Ht 5' 7"  (1.702 m)   Wt 237 lb 3.2 oz (107.6 kg)   SpO2 97% Comment: ra  BMI 37.15 kg/m   Past Medical History:  HTN, A fib, HLD, GERD, Ulcerative colitis, Back pain, Nephrolithiasis, IBS, HH, Glaucoma, DVT 2001 and 2002, Diverticulosis, Depression, Colon polyp, Cataracts, Squamous cell skin cancer, BPH, OA  Past Surgical History:  He  has a past surgical history that includes right wrist fusion 12/12 - pt wearing brace on the wrist-not started physical therapy yet (Right); cervical fusion 2011--pt has good range of motion neck; bilateral carpal tunnel release 2009; Back surgery (3500); nissen fundoplication 9381; Cholecystectomy (1994); Appendectomy (1993); Transurethral resection of prostate (10/09/2011); Prostate surgery (2014); Spine surgery (2009); mass removed from 2nd finger (Left, 03/2013); uvvvp; Total knee revision (Right, 11/17/2013); Mass excision (Left, 01/04/2014); Joint replacement (Bilateral); XI Robotic assisted inguinal hernia repair with mesh (Bilateral, 07/31/2016); Insertion of mesh (Bilateral, 07/31/2016); Total shoulder arthroplasty (Right, 01/28/2018); thyroid nodule; Total shoulder arthroplasty (Left, 07/15/2018); Eye surgery (N/A); and Lumbar laminectomy/decompression microdiscectomy (N/A, 07/24/2021).  Brief Summary:  [name] is a 71 y.o. male former smoker with obstructive sleep apnea.      Subjective:   He uses CPAP nightly.  No issues with mask fit.  The connections to his CPAP are starting to become loose.    He has persistent sinus congestion and runny nose.  He has been getting more cough with clear to green sputum.  He wheezes intermittently.  This has gotten worse during allergy  season.  He was stung by a wasp recently on his finger, and got swelling in that finger.  He didn't have any further reaction besides this.  Physical Exam:   Appearance - well kempt   ENMT - no sinus tenderness, no oral exudate, no LAN, Mallampati 3 airway, no stridor  Respiratory - equal breath sounds bilaterally, no wheezing or rales  CV - s1s2 regular rate and rhythm, no murmurs  Ext - no clubbing, no edema  Skin - no rashes  Psych - normal mood and affect    Pulmonary testing:  FeNO 09/14/18 >> 24 Spirometry 09/14/18 >> FEV1 2.4 (83%), FEV1% 83  Sleep Tests:  PSG 12/21/91 >> AHI 56 Auto CPAP 02/04/22 to 03/05/22 >> used on 30 of 30 nights with average 6 hrs 55 min.  Average AHI 3.6 with median CPAP 10 and 95 th percentile CPAP 12 cm H2O  Cardiac Tests:  Echo 07/19/18 >> EF 60 to 65%, grade 2 DD  Social History:  He  reports that he quit smoking about 38 years ago. His smoking use included cigarettes. He has a 19.00 pack-year smoking history. He has never used smokeless tobacco. He reports current alcohol use of about 6.0 standard drinks of alcohol per week. He reports that he does not use drugs.  Family History:  His family history includes Arthritis in his mother; Bone cancer in his father; Cancer in his father; Colon cancer in his father; Deep vein thrombosis in his mother; Diabetes in his father, paternal grandfather, and sister; Hearing loss in his maternal grandmother and mother; Hyperlipidemia in his mother; Hypertension in his mother; Prostate cancer in his father.  Assessment/Plan:   Obstructive sleep apnea. - he is compliant with therapy and reports benefit - he uses [employer] for his DME - his current CPAP is more than 73 yrs old - will arrange for new auto CPAP 5 to 20 cm H2O  Chronic productive cough with history of allergic rhinitis. - concerned he has some degree of asthma contributing to his symptoms - will arrange for CBC with diff, IgE, CMET, and chest  xray - will have him try symbicort 80 two puffs bid - continue prn albuterol - will arrange for PFT in [facility] office   Allergic rhinitis with CPAP rhinitis. - continue flonase, xyzal, nasal irrigation  Obesity. - discussed importance of weight loss  Time Spent Involved in Patient Care on Day of Examination:  38 minutes  Follow up:   Patient Instructions  Will have [employer] arrange for a new auto CPAP machine.  Lab tests today.  Chest [facility] at [facility].  Symbicort two puffs in the morning and two puffs in the evening, and rinse your mouth after each use.  Albuterol two puffs every 6 hours as needed for cough, wheeze, or chest congestion.  Will arrange for pulmonary function test in [facility] office and follow up with Dr. Halford Chessman or Nurse Practitioner in 6 weeks.  Medication List:   Allergies as of 03/09/2022       Reactions   Adhesive [tape] Other (See Comments)   Causes blisters - pls use paper tape   Other Other (See Comments)   "Adhesives"Pt allergic to some tapes--pt request that we only use paper tape!!        Medication List        Accurate as of March 09, 2022  9:48 AM. If you have any questions, ask your nurse or doctor.          STOP taking these medications    Xelpros 0.005 % Emul Generic drug: Latanoprost Stopped by: Chesley Mires, MD       TAKE these medications    albuterol 108 (90 Base) MCG/ACT inhaler Commonly known as: VENTOLIN HFA Inhale 2 puffs into the lungs every 4 (four) hours as needed for wheezing or shortness of breath.   atorvastatin 40 MG tablet Commonly known as: LIPITOR Take 1 tablet (40 mg total) by mouth daily.   budesonide-formoterol 80-4.5 MCG/ACT inhaler Commonly known as: Symbicort Inhale 2 puffs into the lungs 2 (two) times daily. Started by: Chesley Mires, MD   CALCIUM + D PO Take 1 tablet by mouth at bedtime.   cholecalciferol 25 MCG (1000 UNIT) tablet Commonly known as: VITAMIN D3 Take 1,000 Units  by mouth daily.   fentaNYL 25 MCG/HR Commonly known as: DURAGESIC Place 25 mcg onto the skin every 3 (three) days.   fluticasone 50 MCG/ACT nasal spray Commonly known as: FLONASE Place 1 spray into both nostrils at bedtime.   hydroxypropyl methylcellulose / hypromellose 2.5 % ophthalmic solution Commonly known as: ISOPTO TEARS / GONIOVISC Place 1 drop into both eyes 3 (three) times daily as needed for dry eyes.   levocetirizine 5 MG tablet Commonly known as: XYZAL TAKE 1 TABLET BY MOUTH EVERY EVENING   meclizine 25 MG tablet Commonly known as: ANTIVERT Take 1 tablet (25 mg total) by mouth 3 (three) times daily as needed for dizziness.   PRESCRIPTION MEDICATION Inhale into the lungs at bedtime. CPAP   sodium chloride 0.65 % Soln nasal spray Commonly known as: OCEAN Place 1 spray into both nostrils as needed for  congestion.   traZODone 50 MG tablet Commonly known as: DESYREL Take 100 mg by mouth at bedtime.   vortioxetine HBr 10 MG Tabs tablet Commonly known as: Trintellix Take 1 tablet (10 mg total) by mouth at bedtime.   Xarelto 20 MG Tabs tablet Generic drug: rivaroxaban TAKE 1 TABLET BY MOUTH DAILY WITH SUPPER        Signature:  Chesley Mires, MD [place] Pager - [phone] 03/09/2022, 9:48 AM

## 2022-03-11 DIAGNOSIS — H401131 Primary open-angle glaucoma, bilateral, mild stage: Secondary | ICD-10-CM | POA: Diagnosis not present

## 2022-03-12 LAB — CBC WITH DIFFERENTIAL/PLATELET
Basophils Absolute: 0.1 10*3/uL (ref 0.0–0.2)
Basos: 1 %
EOS (ABSOLUTE): 0.3 10*3/uL (ref 0.0–0.4)
Eos: 6 %
Hematocrit: 46.4 % (ref 37.5–51.0)
Hemoglobin: 15.8 g/dL (ref 13.0–17.7)
Immature Grans (Abs): 0 10*3/uL (ref 0.0–0.1)
Immature Granulocytes: 0 %
Lymphocytes Absolute: 1.7 10*3/uL (ref 0.7–3.1)
Lymphs: 31 %
MCH: 32.2 pg (ref 26.6–33.0)
MCHC: 34.1 g/dL (ref 31.5–35.7)
MCV: 95 fL (ref 79–97)
Monocytes Absolute: 0.7 10*3/uL (ref 0.1–0.9)
Monocytes: 12 %
Neutrophils Absolute: 2.6 10*3/uL (ref 1.4–7.0)
Neutrophils: 50 %
Platelets: 176 10*3/uL (ref 150–450)
RBC: 4.91 x10E6/uL (ref 4.14–5.80)
RDW: 12.8 % (ref 11.6–15.4)
WBC: 5.4 10*3/uL (ref 3.4–10.8)

## 2022-03-12 LAB — COMPREHENSIVE METABOLIC PANEL
ALT: 23 IU/L (ref 0–44)
AST: 25 IU/L (ref 0–40)
Albumin/Globulin Ratio: 1.9 (ref 1.2–2.2)
Albumin: 4.5 g/dL (ref 3.8–4.8)
Alkaline Phosphatase: 102 IU/L (ref 44–121)
BUN/Creatinine Ratio: 12 (ref 10–24)
BUN: 13 mg/dL (ref 8–27)
Bilirubin Total: 0.6 mg/dL (ref 0.0–1.2)
CO2: 25 mmol/L (ref 20–29)
Calcium: 9.8 mg/dL (ref 8.6–10.2)
Chloride: 101 mmol/L (ref 96–106)
Creatinine, Ser: 1.12 mg/dL (ref 0.76–1.27)
Globulin, Total: 2.4 g/dL (ref 1.5–4.5)
Glucose: 108 mg/dL — ABNORMAL HIGH (ref 70–99)
Potassium: 4.5 mmol/L (ref 3.5–5.2)
Sodium: 141 mmol/L (ref 134–144)
Total Protein: 6.9 g/dL (ref 6.0–8.5)
eGFR: 70 mL/min/{1.73_m2} (ref >59)

## 2022-03-12 LAB — IGE: IgE (Immunoglobulin E), Serum: 7 IU/mL (ref 6–495)

## 2022-04-22 ENCOUNTER — Telehealth: Payer: Self-pay | Admitting: Pharmacist

## 2022-04-22 NOTE — Progress Notes (Signed)
Chronic Care Management Pharmacy Assistant   Name: [name]  MRN: [id number] DOB: [birth year]   Reason for Encounter: Disease State - Hypertension Call      Recent office visits:  None noted.   Recent consult visits:  03/09/22 Chesley Mires, MD - Pulmonology - OSA - Labs were ordered. CXR ordered. Referral for CPAP and PFTs done. budesonide-formoterol (SYMBICORT) 80-4.5 MCG/ACT inhaler prescribed. Follow up as scheduled.   Hospital visits:  None in previous 6 months  Medications: Outpatient Encounter Medications as of 04/22/2022  Medication Sig   albuterol (VENTOLIN HFA) 108 (90 Base) MCG/ACT inhaler Inhale 2 puffs into the lungs every 4 (four) hours as needed for wheezing or shortness of breath.   atorvastatin (LIPITOR) 40 MG tablet Take 1 tablet (40 mg total) by mouth daily.   budesonide-formoterol (SYMBICORT) 80-4.5 MCG/ACT inhaler Inhale 2 puffs into the lungs 2 (two) times daily.   Calcium Carbonate-Vitamin D (CALCIUM + D PO) Take 1 tablet by mouth at bedtime.    cholecalciferol (VITAMIN D3) 25 MCG (1000 UNIT) tablet Take 1,000 Units by mouth daily.   fentaNYL (DURAGESIC - DOSED MCG/HR) 25 MCG/HR Place 25 mcg onto the skin every 3 (three) days.    fluticasone (FLONASE) 50 MCG/ACT nasal spray Place 1 spray into both nostrils at bedtime.   hydroxypropyl methylcellulose / hypromellose (ISOPTO TEARS / GONIOVISC) 2.5 % ophthalmic solution Place 1 drop into both eyes 3 (three) times daily as needed for dry eyes.   levocetirizine (XYZAL) 5 MG tablet TAKE 1 TABLET BY MOUTH EVERY EVENING   meclizine (ANTIVERT) 25 MG tablet Take 1 tablet (25 mg total) by mouth 3 (three) times daily as needed for dizziness.   PRESCRIPTION MEDICATION Inhale into the lungs at bedtime. CPAP   sodium chloride (OCEAN) 0.65 % SOLN nasal spray Place 1 spray into both nostrils as needed for congestion.   traZODone (DESYREL) 50 MG tablet Take 100 mg by mouth at bedtime.   vortioxetine HBr (TRINTELLIX) 10 MG  TABS tablet Take 1 tablet (10 mg total) by mouth at bedtime.   XARELTO 20 MG TABS tablet TAKE 1 TABLET BY MOUTH DAILY WITH SUPPER   No facility-administered encounter medications on file as of 04/22/2022.    Current antihypertensive regimen:  None  How often are you checking your Blood Pressure?  Patient reported checking blood pressures  Current home BP readings:     What recent interventions/DTPs have been made by any provider to improve Blood Pressure control since last CPP Visit:  Patient is not currently on any prescription hypertensive medications.    Any recent hospitalizations or ED visits since last visit with CPP? Patient has not had any hospitalizations or ED visits since last visit with CPP    What diet changes have been made to improve Blood Pressure Control?  Patient reported being careful with sodium intake   What exercise is being done to improve your Blood Pressure Control?   Patient reported being active.   Adherence Review: Is the patient currently on ACE/ARB medication? No Does the patient have >5 day gap between last estimated fill dates? No    Care Gaps   AWV: done 06/19/21 Colonoscopy: due 01/07/26 DM Eye Exam: N/A DM Foot Exam: N/A Microalbumin: N/A HbgAIC: done 06/13/21 (5.7) DEXA: done 09/30/07 Mammogram: N/A     Star Rating Drugs: Atorvastatin 40 mg - last filled 04/16/22 90 days     Future Appointments  Date Time Provider [place]  04/28/2022  9:00 AM [facility] PFT ROOM [facility] None  04/28/2022 10:00 AM Chesley Mires, MD [facility] None  07/02/2022  9:45 AM [facility]NURSE HEALTH ADVISOR [facility] [place], [facility] Clinical Pharmacist Assistant  [phone]

## 2022-04-27 ENCOUNTER — Encounter: Payer: Self-pay | Admitting: Internal Medicine

## 2022-04-27 ENCOUNTER — Ambulatory Visit (INDEPENDENT_AMBULATORY_CARE_PROVIDER_SITE_OTHER): Payer: Medicare HMO | Admitting: Internal Medicine

## 2022-04-27 VITALS — BP 120/60 | HR 68 | Ht 67.0 in | Wt 231.8 lb

## 2022-04-27 DIAGNOSIS — Z0000 Encounter for general adult medical examination without abnormal findings: Secondary | ICD-10-CM | POA: Diagnosis not present

## 2022-04-27 DIAGNOSIS — I10 Essential (primary) hypertension: Secondary | ICD-10-CM | POA: Diagnosis not present

## 2022-04-27 DIAGNOSIS — K219 Gastro-esophageal reflux disease without esophagitis: Secondary | ICD-10-CM

## 2022-04-27 DIAGNOSIS — M179 Osteoarthritis of knee, unspecified: Secondary | ICD-10-CM

## 2022-04-27 DIAGNOSIS — G4733 Obstructive sleep apnea (adult) (pediatric): Secondary | ICD-10-CM | POA: Diagnosis not present

## 2022-04-27 DIAGNOSIS — I480 Paroxysmal atrial fibrillation: Secondary | ICD-10-CM | POA: Diagnosis not present

## 2022-04-27 DIAGNOSIS — G4700 Insomnia, unspecified: Secondary | ICD-10-CM | POA: Diagnosis not present

## 2022-04-27 DIAGNOSIS — M7918 Myalgia, other site: Secondary | ICD-10-CM

## 2022-04-27 DIAGNOSIS — F339 Major depressive disorder, recurrent, unspecified: Secondary | ICD-10-CM

## 2022-04-27 DIAGNOSIS — G8929 Other chronic pain: Secondary | ICD-10-CM

## 2022-04-27 DIAGNOSIS — E785 Hyperlipidemia, unspecified: Secondary | ICD-10-CM | POA: Diagnosis not present

## 2022-04-27 DIAGNOSIS — Z0001 Encounter for general adult medical examination with abnormal findings: Secondary | ICD-10-CM

## 2022-04-27 NOTE — Patient Instructions (Signed)
It was a pleasure to see you today.  Thank you for giving [place] the opportunity to be involved in your care.  Below is a brief recap of your visit and next steps.  We will plan to see you again in 3 months  Summary We will check labs today I have referred you to gastroenterology, Dr. Abbey Chatters, to establish care. I recommend trying benadryl, heating pad, and continuing to use Tylenol to treat your headache  Next steps Follow up in 3 months I will notify you of lab results Follow up with me sooner if needed for headache relief

## 2022-04-27 NOTE — Progress Notes (Unsigned)
New Patient Office Visit  Subjective    Patient ID: [name], male    DOB: 06-21-1951  Age: 71 y.o. MRN: [id number]  CC:  Chief Complaint  Patient presents with   Establish Care   HPI [name] presents to establish care.  He is a [age] year old gentleman with a past medical history significant for paroxysmal atrial fibrillation, HTN, nonobstructive CAD, OSA, GERD, HLD, asthma, insomnia, depression, and chronic MSK pain.  He was previously followed by Dr. Dennard Schaumann at [facility].  [name] states he is doing fairly well today.  His acute concerns include headache that has been present for the past 6 days and requesting to establish care with gastroenterology in [place].  He has previously been followed by [facility] in [place], however would like to establish care in [place] as it is a more convenient location.  [name] states that he has a dull headache radiating from the back of his head to his forehead bilaterally.  He endorses intermittent photophobia.  He has been taking Tylenol which seems to temporarily relieve the pain.  He additionally endorses chronic MSK pain but is otherwise asymptomatic.  Acute concerns, chronic medical conditions, and outstanding preventative healthcare maintenance items discussed today are individually addressed in A/P below.  Outpatient Encounter Medications as of 04/27/2022  Medication Sig   albuterol (VENTOLIN HFA) 108 (90 Base) MCG/ACT inhaler Inhale 2 puffs into the lungs every 4 (four) hours as needed for wheezing or shortness of breath.   atorvastatin (LIPITOR) 40 MG tablet Take 1 tablet (40 mg total) by mouth daily.   budesonide-formoterol (SYMBICORT) 80-4.5 MCG/ACT inhaler Inhale 2 puffs into the lungs 2 (two) times daily.   Calcium Carbonate-Vitamin D (CALCIUM + D PO) Take 1 tablet by mouth at bedtime.    fentaNYL (DURAGESIC - DOSED MCG/HR) 25 MCG/HR Place 25 mcg onto the skin every 3 (three) days.    fluticasone  (FLONASE) 50 MCG/ACT nasal spray Place 1 spray into both nostrils at bedtime.   hydroxypropyl methylcellulose / hypromellose (ISOPTO TEARS / GONIOVISC) 2.5 % ophthalmic solution Place 1 drop into both eyes 3 (three) times daily as needed for dry eyes.   levocetirizine (XYZAL) 5 MG tablet TAKE 1 TABLET BY MOUTH EVERY EVENING   meclizine (ANTIVERT) 25 MG tablet Take 1 tablet (25 mg total) by mouth 3 (three) times daily as needed for dizziness.   PRESCRIPTION MEDICATION Inhale into the lungs at bedtime. CPAP   sodium chloride (OCEAN) 0.65 % SOLN nasal spray Place 1 spray into both nostrils as needed for congestion.   traZODone (DESYREL) 50 MG tablet Take 100 mg by mouth at bedtime.   vortioxetine HBr (TRINTELLIX) 10 MG TABS tablet Take 1 tablet (10 mg total) by mouth at bedtime.   XARELTO 20 MG TABS tablet TAKE 1 TABLET BY MOUTH DAILY WITH SUPPER   [DISCONTINUED] cholecalciferol (VITAMIN D3) 25 MCG (1000 UNIT) tablet Take 1,000 Units by mouth daily.   No facility-administered encounter medications on file as of 04/27/2022.   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  Cataracts, both eyes    worst on left   Colon polyps    Complication of anesthesia    pt had spinal for a knee replacement--"woke up" during surgery--and sick after surgery   Depression    Diverticulosis    DVT (deep venous thrombosis) ([place]) 2001 or 2002   right leg-required hospitalization x 8 days   DVT (deep venous thrombosis) ([place]) 1996   right   Glaucoma    both eyes   H/O hiatal hernia    Hearing impaired person, bilateral    bilateral   Hemorrhoid    History of IBS    History of  kidney stones    x 1-passed   Hyperlipidemia 2012   Hypertension    Lumbago    Lumbar post-laminectomy syndrome    PAF (paroxysmal atrial fibrillation) (HCC)    PONV (postoperative nausea and vomiting)    Sleep apnea    pt uses cpap - setting of 16   Ulcer 1966   Ulcerative colitis ([place])    Wears glasses    Wears hearing aid     Past Surgical History:  Procedure Laterality Date   [place]  2008   bone spur removed    bilateral carpal tunnel release 2009     cervical fusion 2011--pt has good range of motion neck     CHOLECYSTECTOMY  1994   EYE SURGERY N/A    [employer] 07/13/2020   INSERTION OF MESH Bilateral 07/31/2016   Procedure: INSERTION OF MESH;  Surgeon: [name], MD;  Location: [facility];  Service: General;  Laterality: Bilateral;   JOINT REPLACEMENT Bilateral    2001 left total knee and 1985 right total knee   LUMBAR LAMINECTOMY/DECOMPRESSION MICRODISCECTOMY N/A 07/24/2021   Procedure: DECOMPRESSION AND REMOVAL OF CYST LUMBAR FIVE THROUGH SACRAL ONE;  Surgeon: Melina Schools, MD;  Location: [facility];  Service: Orthopedics;  Laterality: N/A;   MASS EXCISION Left 01/04/2014   Procedure: LEFT MIDDLE FINGER MASS EXCISION WITH NAILBED REPAIR AND ADVANCEMENT FLAP;  Surgeon: [name], MD;  Location: [facility];  Service: Orthopedics;  Laterality: Left;   mass removed from 2nd finger Left 03/2013   squamous carcinoma   nissen fundoplication 8299     PROSTATE SURGERY  2014   TURP   right wrist fusion 12/12 - pt wearing brace on the wrist-not started physical therapy yet Right    fusion prevent limited ROM to right wrist   SPINE SURGERY  2009   cervical spine fusion   thyroid nodule     removed   TOTAL KNEE REVISION Right 11/17/2013   Procedure: RIGHT KNEE POLYETHYLENE REVISION, bone graft;  Surgeon: [name], MD;  Location: [facility];  Service: Orthopedics;  Laterality: Right;   TOTAL SHOULDER ARTHROPLASTY Right 01/28/2018    Procedure: RIGHT TOTAL SHOULDER ARTHROPLASTY;  Surgeon: [name], MD;  Location: [facility];  Service: Orthopedics;  Laterality: Right;   TOTAL SHOULDER ARTHROPLASTY Left 07/15/2018   Procedure: LEFT SHOULDER ANATOMIC TOTAL SHOULDER ARTHROPLASTY;  Surgeon: [name], MD;  Location: [facility];  Service: Orthopedics;  Laterality: Left;   TRANSURETHRAL RESECTION OF PROSTATE  10/09/2011   Procedure: TRANSURETHRAL RESECTION OF THE PROSTATE WITH GYRUS INSTRUMENTS;  Surgeon: Fredricka Bonine, MD;  Location[facility];  Service: Urology;  Laterality: N/A;   uvvvp     XI ROBOTIC ASSISTED INGUINAL HERNIA REPAIR WITH MESH Bilateral 07/31/2016   Procedure: XI ROBOTIC BILATERAL INGUINAL HERNIA REPAIR WITH  MESH;  Surgeon: [name], MD;  Location: [facility];  Service: General;  Laterality: Bilateral;    Family History  Problem Relation Age of Onset   Colon cancer Father    Bone cancer Father        deceased   Cancer Father    Diabetes Father    Prostate cancer Father    Arthritis Mother    Hearing loss Mother    Hyperlipidemia Mother    Hypertension Mother    Deep vein thrombosis Mother    Diabetes Sister    Hearing loss Maternal Grandmother    Diabetes Paternal Grandfather     Social History   Socioeconomic History   Marital status: Married    Spouse name: Opal Sidles   Number of children: 1   Years of education: Not on file   Highest education level: Not on file  Occupational History   Occupation: disabled  Tobacco Use   Smoking status: Former    Packs/day: 1.00    Years: 19.00    Total pack years: 19.00    Types: Cigarettes    Quit date: 08/04/1983    Years since quitting: 38.7   Smokeless tobacco: Never   Tobacco comments:    quit smoking 1985  Vaping Use   Vaping Use: Never used  Substance and Sexual Activity   Alcohol use: Yes    Alcohol/week: 6.0 standard drinks of alcohol    Types: 6 Cans of beer per week    Comment: maybe 2 or 3 beers per week   Drug use: No    Sexual activity: Not Currently  Other Topics Concern   Not on file  Social History Narrative   Married x 3 years in 2021.   1 daughter, 1 grand son and 1 grand daughter   Social Determinants of Health   Financial Resource Strain: Low Risk  (06/19/2021)   Overall Financial Resource Strain (CARDIA)    Difficulty of Paying Living Expenses: Not hard at all  Food Insecurity: No Food Insecurity (06/19/2021)   Hunger Vital Sign    Worried About Running Out of Food in the Last Year: Never true    Ran Out of Food in the Last Year: Never true  Transportation Needs: No Transportation Needs (06/19/2021)   PRAPARE - Hydrologist (Medical): No    Lack of Transportation (Non-Medical): No  Physical Activity: Insufficiently Active (06/19/2021)   Exercise Vital Sign    Days of Exercise per Week: 3 days    Minutes of Exercise per Session: 20 min  Stress: No Stress Concern Present (06/19/2021)   [place]    Feeling of Stress : Not at all  Social Connections: [place] (06/19/2021)   Social Connection and Isolation Panel [NHANES]    Frequency of Communication with Friends and Family: More than three times a week    Frequency of Social Gatherings with Friends and Family: More than three times a week    Attends Religious Services: 1 to 4 times per year    Active Member of [employer] or Organizations: Yes    Attends Archivist Meetings: More than 4 times per year    Marital Status: Married  Human resources officer Violence: Not At Risk (06/19/2021)   Humiliation, Afraid, Rape, and Kick questionnaire    Fear of Current or Ex-Partner: No    Emotionally Abused: No    Physically Abused: No    Sexually Abused:  No    Review of Systems  Musculoskeletal:  Positive for back pain, joint pain and neck pain.  Neurological:  Positive for headaches.  All other systems reviewed and are  negative.  Objective    BP 120/60   Pulse 68   Ht 5' 7"  (1.702 m)   Wt 231 lb 12.8 oz (105.1 kg)   SpO2 97%   BMI 36.31 kg/m   Physical Exam Vitals reviewed.  Constitutional:      General: He is not in acute distress.    Appearance: Normal appearance. He is obese. He is not ill-appearing.  HENT:     Head: Normocephalic and atraumatic.     Nose: Nose normal. No congestion or rhinorrhea.     Mouth/Throat:     Mouth: Mucous membranes are moist.     Pharynx: Oropharynx is clear.  Eyes:     Extraocular Movements: Extraocular movements intact.     Conjunctiva/sclera: Conjunctivae normal.     Pupils: Pupils are equal, round, and reactive to light.  Cardiovascular:     Rate and Rhythm: Normal rate and regular rhythm.     Pulses: Normal pulses.     Heart sounds: Normal heart sounds. No murmur heard. Pulmonary:     Effort: Pulmonary effort is normal.     Breath sounds: Normal breath sounds. No wheezing, rhonchi or rales.  Abdominal:     General: Abdomen is flat. Bowel sounds are normal. There is no distension.     Palpations: Abdomen is soft.     Tenderness: There is no abdominal tenderness.  Musculoskeletal:        General: Tenderness present.     Cervical back: Normal range of motion.     Comments: There is tenderness palpation over the right trapezius at the base of the neck  Skin:    General: Skin is warm and dry.     Capillary Refill: Capillary refill takes less than 2 seconds.  Neurological:     General: No focal deficit present.     Mental Status: He is alert and oriented to person, place, and time.     Motor: No weakness.  Psychiatric:        Mood and Affect: Mood normal.        Behavior: Behavior normal.        Thought Content: Thought content normal.    Assessment & Plan:   Problem List Items Addressed This Visit       Cardiovascular and Mediastinum   Essential hypertension    BP 120/60 today.  Not currently on any antihypertensive medication.       Paroxysmal atrial fibrillation (HCC)    Currently prescribed Xarelto.  Not on any rate control therapy.  HR 68 today.        Respiratory   OSA (obstructive sleep apnea)    Previously documented history of OSA.  Compliant with CPAP.  Followed by pulmonology, last seen one month ago.        Digestive   GERD    Asymptomatic currently.  Underwent Nissen fundoplication in 7026.  He is followed by [facility] like to establish care with gastroenterology in [place], specifically Dr. Abbey Chatters with [facility]. -Referral placed today        Other   Hyperlipidemia - Primary    Currently prescribed atorvastatin 40 mg daily.  Repeat lipid panel today.      Insomnia    Currently prescribed trazodone nightly for insomnia.  No concerns today.  Depression, recurrent ([place])    Currently prescribed Trintellix.  Mood stable.      Chronic musculoskeletal pain    Previously documented history of osteoarthritis and degenerative changes in multiple joints.  He also has lumbar spinal stenosis and has undergone multiple prior procedures.  Currently followed by [facility] and pain management.  He is prescribed fentanyl patches.  States that his pain is stable currently.      Preventative health care    Presenting today to establish care -Basic labs ordered today -Recommended that he obtain outstanding Shingrix, Tdap, and COVID vaccines at his pharmacy      Return in about 3 months (around 07/27/2022).   [name], MD

## 2022-04-28 ENCOUNTER — Ambulatory Visit (INDEPENDENT_AMBULATORY_CARE_PROVIDER_SITE_OTHER): Payer: Medicare HMO | Admitting: Pulmonary Disease

## 2022-04-28 ENCOUNTER — Encounter: Payer: Self-pay | Admitting: Internal Medicine

## 2022-04-28 ENCOUNTER — Encounter: Payer: Self-pay | Admitting: *Deleted

## 2022-04-28 ENCOUNTER — Encounter: Payer: Self-pay | Admitting: Pulmonary Disease

## 2022-04-28 ENCOUNTER — Ambulatory Visit: Payer: Medicare HMO | Admitting: Pulmonary Disease

## 2022-04-28 VITALS — BP 110/70 | HR 73 | Ht 66.5 in | Wt 231.2 lb

## 2022-04-28 DIAGNOSIS — Z0000 Encounter for general adult medical examination without abnormal findings: Secondary | ICD-10-CM | POA: Insufficient documentation

## 2022-04-28 DIAGNOSIS — F339 Major depressive disorder, recurrent, unspecified: Secondary | ICD-10-CM | POA: Insufficient documentation

## 2022-04-28 DIAGNOSIS — J3089 Other allergic rhinitis: Secondary | ICD-10-CM

## 2022-04-28 DIAGNOSIS — J310 Chronic rhinitis: Secondary | ICD-10-CM | POA: Diagnosis not present

## 2022-04-28 DIAGNOSIS — J4540 Moderate persistent asthma, uncomplicated: Secondary | ICD-10-CM

## 2022-04-28 DIAGNOSIS — G4733 Obstructive sleep apnea (adult) (pediatric): Secondary | ICD-10-CM

## 2022-04-28 DIAGNOSIS — R053 Chronic cough: Secondary | ICD-10-CM

## 2022-04-28 DIAGNOSIS — G8929 Other chronic pain: Secondary | ICD-10-CM | POA: Insufficient documentation

## 2022-04-28 LAB — PULMONARY FUNCTION TEST
DL/VA % pred: 119 %
DL/VA: 4.83 ml/min/mmHg/L
DLCO cor % pred: 108 %
DLCO cor: 24.74 ml/min/mmHg
DLCO unc % pred: 112 %
DLCO unc: 25.54 ml/min/mmHg
FEF 25-75 Post: 3.08 L/sec
FEF 25-75 Pre: 2.73 L/sec
FEF2575-%Change-Post: 13 %
FEF2575-%Pred-Post: 153 %
FEF2575-%Pred-Pre: 135 %
FEV1-%Change-Post: 3 %
FEV1-%Pred-Post: 102 %
FEV1-%Pred-Pre: 98 %
FEV1-Post: 2.78 L
FEV1-Pre: 2.68 L
FEV1FVC-%Change-Post: 7 %
FEV1FVC-%Pred-Pre: 108 %
FEV6-%Change-Post: -3 %
FEV6-%Pred-Post: 93 %
FEV6-%Pred-Pre: 97 %
FEV6-Post: 3.27 L
FEV6-Pre: 3.39 L
FEV6FVC-%Change-Post: 0 %
FEV6FVC-%Pred-Post: 106 %
FEV6FVC-%Pred-Pre: 106 %
FVC-%Change-Post: -3 %
FVC-%Pred-Post: 87 %
FVC-%Pred-Pre: 90 %
FVC-Post: 3.27 L
FVC-Pre: 3.39 L
Post FEV1/FVC ratio: 85 %
Post FEV6/FVC ratio: 100 %
Pre FEV1/FVC ratio: 79 %
Pre FEV6/FVC Ratio: 100 %
RV % pred: 169 %
RV: 3.93 L
TLC % pred: 118 %
TLC: 7.5 L

## 2022-04-28 LAB — HEMOGLOBIN A1C
Est. average glucose Bld gHb Est-mCnc: 126 mg/dL
Hgb A1c MFr Bld: 6 % — ABNORMAL HIGH (ref 4.8–5.6)

## 2022-04-28 LAB — LIPID PANEL
Chol/HDL Ratio: 2.6 ratio (ref 0.0–5.0)
Cholesterol, Total: 129 mg/dL (ref 100–199)
HDL: 49 mg/dL (ref >39)
LDL Chol Calc (NIH): 63 mg/dL (ref 0–99)
Triglycerides: 86 mg/dL (ref 0–149)
VLDL Cholesterol Cal: 17 mg/dL (ref 5–40)

## 2022-04-28 LAB — VITAMIN D 25 HYDROXY (VIT D DEFICIENCY, FRACTURES): Vit D, 25-Hydroxy: 85.9 ng/mL (ref 30.0–100.0)

## 2022-04-28 LAB — B12 AND FOLATE PANEL
Folate: 16.3 ng/mL (ref >3.0)
Vitamin B-12: 492 pg/mL (ref 232–1245)

## 2022-04-28 MED ORDER — AZELASTINE HCL 0.15 % NA SOLN: 1.0000 | Freq: Every morning | NASAL | Status: DC

## 2022-04-28 NOTE — Assessment & Plan Note (Signed)
Currently prescribed Trintellix.  Mood stable.

## 2022-04-28 NOTE — Assessment & Plan Note (Signed)
Asymptomatic currently.  Underwent Nissen fundoplication in 9233.  He is followed by [facility] like to establish care with gastroenterology in [place], specifically Dr. Abbey Chatters with [facility]. -Referral placed today

## 2022-04-28 NOTE — Progress Notes (Addendum)
[facility]  Chief Complaint  Patient presents with   Follow-up    PFT    Constitutional:  BP 110/70 (BP Location: Left Arm)   Pulse 73   Ht 5' 6.5" (1.689 m)   Wt 231 lb 3.2 oz (104.9 kg)   SpO2 90%   BMI 36.76 kg/m   Past Medical History:  HTN, A fib, HLD, GERD, Ulcerative colitis, Back pain, Nephrolithiasis, IBS, HH, Glaucoma, DVT 2001 and 2002, Diverticulosis, Depression, Colon polyp, Cataracts, Squamous cell skin cancer, BPH, OA  Past Surgical History:  He  has a past surgical history that includes right wrist fusion 12/12 - pt wearing brace on the wrist-not started physical therapy yet (Right); cervical fusion 2011--pt has good range of motion neck; bilateral carpal tunnel release 2009; Back surgery (5597); nissen fundoplication 4163; Cholecystectomy (1994); Appendectomy (1993); Transurethral resection of prostate (10/09/2011); Prostate surgery (2014); Spine surgery (2009); mass removed from 2nd finger (Left, 03/2013); uvvvp; Total knee revision (Right, 11/17/2013); Mass excision (Left, 01/04/2014); Joint replacement (Bilateral); XI Robotic assisted inguinal hernia repair with mesh (Bilateral, 07/31/2016); Insertion of mesh (Bilateral, 07/31/2016); Total shoulder arthroplasty (Right, 01/28/2018); thyroid nodule; Total shoulder arthroplasty (Left, 07/15/2018); Eye surgery (N/A); and Lumbar laminectomy/decompression microdiscectomy (N/A, 07/24/2021).  Brief Summary:  [name] is a 71 y.o. male former smoker with obstructive sleep apnea, allergic asthma, and perennial allergic rhinitis.      Subjective:   His chest xray from 03/09/22 was normal.  Labs from 03/09/22 were normal.  PFT today was normal.  He still gets sinus congestion and headaches.  He has drainage in his throat.  This gets worse when he cuts the grass or is outside for longer periods of time.  He feels better when he goes to the beach.  He has cough with clear  sputum, but not as bad as before.  Feels like symbicort helps.  He got his new CPAP.  Had trouble with tubing, but is waiting for replacement from his DME.   Physical Exam:   Appearance - well kempt   ENMT - no sinus tenderness, no oral exudate, no LAN, Mallampati 3 airway, no stridor, deviated septum  Respiratory - equal breath sounds bilaterally, no wheezing or rales  CV - s1s2 regular rate and rhythm, no murmurs  Ext - no clubbing, no edema  Skin - no rashes  Psych - normal mood and affect     Pulmonary testing:  FeNO 09/14/18 >> 24 Spirometry 09/14/18 >> FEV1 2.4 (83%), FEV1% 83 IgE 03/09/22 >> 7 PFT 04/28/22 >> FEV1 2.78 (102%), FEV1% 85, TLC 7.50 (118%), DLCO 112%  Sleep Tests:  PSG 12/21/91 >> AHI 56 Auto CPAP 02/04/22 to 03/05/22 >> used on 30 of 30 nights with average 6 hrs 55 min.  Average AHI 3.6 with median CPAP 10 and 95 th percentile CPAP 12 cm H2O  Cardiac Tests:  Echo 07/19/18 >> EF 60 to 65%, grade 2 DD  Social History:  He  reports that he quit smoking about 38 years ago. His smoking use included cigarettes. He has a 19.00 pack-year smoking history. He has never used smokeless tobacco. He reports current alcohol use of about 6.0 standard drinks of alcohol per week. He reports that he does not use drugs.  Family History:  His family history includes Arthritis in his mother; Bone cancer in his father; Cancer in his father; Colon cancer in his father; Deep vein thrombosis in his mother; Diabetes in his father,  paternal grandfather, and sister; Hearing loss in his maternal grandmother and mother; Hyperlipidemia in his mother; Hypertension in his mother; Prostate cancer in his father.     Assessment/Plan:   Obstructive sleep apnea. - he is compliant with therapy and reports benefit - he uses [employer] for his DME - continue auto CPAP 5 to 20 cm H2O - will get a copy of his CPAP download  Chronic productive cough from allergic asthma and perennial allergic  rhinitis with deviated nasal septum. - continue symbicort 80 two puffs bid - continue nasal irrigation and flonase at night - add astepro in the morning - prn xyzal, benadryl - might need to add singulair if this persists - prn albuterol   Allergic rhinitis with CPAP rhinitis. - flonase, nasal irrigation, astepro, xyzal - he can try breath rite strips at night when using CPAP  Obesity. - discussed importance of weight loss  Time Spent Involved in Patient Care on Day of Examination:  36 minutes  Follow up:   Patient Instructions  Try using saline nasal rinse at night before using flonase.  You can try using astepro nose spray in the morning.  Try using breath rite nasal strips at night to help keep your sinus passages open.  Follow up in 5 to 6 months.  Medication List:   Allergies as of 04/28/2022       Reactions   Adhesive [tape] Other (See Comments)   Causes blisters - pls use paper tape   Other Other (See Comments)   "Adhesives"Pt allergic to some tapes--pt request that we only use paper tape!!        Medication List        Accurate as of April 28, 2022 10:41 AM. If you have any questions, ask your nurse or doctor.          albuterol 108 (90 Base) MCG/ACT inhaler Commonly known as: VENTOLIN HFA Inhale 2 puffs into the lungs every 4 (four) hours as needed for wheezing or shortness of breath.   atorvastatin 40 MG tablet Commonly known as: LIPITOR Take 1 tablet (40 mg total) by mouth daily.   Azelastine HCl 0.15 % Soln Commonly known as: Astepro Place 1 spray into the nose every morning. Started by: Chesley Mires, MD   budesonide-formoterol 80-4.5 MCG/ACT inhaler Commonly known as: Symbicort Inhale 2 puffs into the lungs 2 (two) times daily.   CALCIUM + D PO Take 1 tablet by mouth at bedtime.   fentaNYL 25 MCG/HR Commonly known as: DURAGESIC Place 25 mcg onto the skin every 3 (three) days.   fluticasone 50 MCG/ACT nasal spray Commonly  known as: FLONASE Place 1 spray into both nostrils at bedtime.   hydroxypropyl methylcellulose / hypromellose 2.5 % ophthalmic solution Commonly known as: ISOPTO TEARS / GONIOVISC Place 1 drop into both eyes 3 (three) times daily as needed for dry eyes.   levocetirizine 5 MG tablet Commonly known as: XYZAL TAKE 1 TABLET BY MOUTH EVERY EVENING   meclizine 25 MG tablet Commonly known as: ANTIVERT Take 1 tablet (25 mg total) by mouth 3 (three) times daily as needed for dizziness.   PRESCRIPTION MEDICATION Inhale into the lungs at bedtime. CPAP   sodium chloride 0.65 % Soln nasal spray Commonly known as: OCEAN Place 1 spray into both nostrils as needed for congestion.   traZODone 50 MG tablet Commonly known as: DESYREL Take 100 mg by mouth at bedtime.   vortioxetine HBr 10 MG Tabs tablet Commonly known as: Trintellix Take  1 tablet (10 mg total) by mouth at bedtime.   Xarelto 20 MG Tabs tablet Generic drug: rivaroxaban TAKE 1 TABLET BY MOUTH DAILY WITH SUPPER        Signature:  Chesley Mires, MD [place] Pager - [phone] 04/28/2022, 10:41 AM      Auto CPAP download received.  Auto CPAP 03/29/22 to 04/27/22 >> used on 30 of 30 nights with average 7 hrs 5 min.  Average AHI 6.1 with median CPAP 10 and 95 th percentile CPAP 12 cm H2O.   Chesley Mires, MD [place] Pager - [phone] 04/28/2022, 10:42 AM

## 2022-04-28 NOTE — Patient Instructions (Signed)
Try using saline nasal rinse at night before using flonase.  You can try using astepro nose spray in the morning.  Try using breath rite nasal strips at night to help keep your sinus passages open.  Follow up in 5 to 6 months.

## 2022-04-28 NOTE — Assessment & Plan Note (Signed)
Currently prescribed atorvastatin 40 mg daily.  Repeat lipid panel today.

## 2022-04-28 NOTE — Assessment & Plan Note (Signed)
BP 120/60 today.  Not currently on any antihypertensive medication.

## 2022-04-28 NOTE — Assessment & Plan Note (Signed)
Currently prescribed trazodone nightly for insomnia.  No concerns today.

## 2022-04-28 NOTE — Assessment & Plan Note (Signed)
Currently prescribed Xarelto.  Not on any rate control therapy.  HR 68 today.

## 2022-04-28 NOTE — Assessment & Plan Note (Signed)
Previously documented history of osteoarthritis and degenerative changes in multiple joints.  He also has lumbar spinal stenosis and has undergone multiple prior procedures.  Currently followed by [facility] and pain management.  He is prescribed fentanyl patches.  States that his pain is stable currently.

## 2022-04-28 NOTE — Assessment & Plan Note (Signed)
Presenting today to establish care -Basic labs ordered today -Recommended that he obtain outstanding Shingrix, Tdap, and COVID vaccines at his pharmacy

## 2022-04-28 NOTE — Addendum Note (Signed)
Addended by: Marland Kitchen E on: 04/28/2022 09:11 AM   Modules accepted: Level of Service

## 2022-04-28 NOTE — Assessment & Plan Note (Signed)
Previously documented history of OSA.  Compliant with CPAP.  Followed by pulmonology, last seen one month ago.

## 2022-04-28 NOTE — Progress Notes (Signed)
PFT done today. 

## 2022-05-14 ENCOUNTER — Ambulatory Visit: Payer: Medicare HMO | Admitting: Gastroenterology

## 2022-05-19 NOTE — Progress Notes (Unsigned)
Referring Provider: Billie Lade, MD Primary Care Physician:  Billie Lade, MD Primary Gastroenterologist:  Dr. Marletta Lor  No chief complaint on file.   HPI:   [name] is a 71 y.o. male presenting today at the request of Billie Lade, MD for history of GERD, hiatal hernia s/p Niesen fundoplication in 1995.  Patient previously followed by [facility], but requested to establish at [facility].   Today:     Last colonoscopy 01/08/2016:  - The examined portion of the ileum was normal. - The colon (entire examined portion) appeared normal except the very distal rectum appeared very slightly inflammed (5-6cm with subtle transition to more normal appearing mucosa). Biopsies were taken from right colon, left colon and rectum (separate jars). - The examination was otherwise normal on direct and retroflexion views. -Pathology benign. -Recommended 10-year screening colonoscopy.  EGD 07/17/2005 evidence of prior antireflux surgery, hiatal hernia, otherwise normal exam.   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   Cataracts, both eyes    worst on left   Colon polyps    Complication of anesthesia    pt had spinal for a knee replacement--"woke up" during surgery--and sick after surgery   Depression    Diverticulosis    DVT (deep venous thrombosis) (HCC) 2001 or 2002   right leg-required hospitalization x 8 days   DVT (deep venous thrombosis) (HCC) 1996   right   Glaucoma    both eyes   H/O hiatal hernia    Hearing impaired person, bilateral    bilateral   Hemorrhoid    History of IBS    History of kidney stones    x 1-passed    Hyperlipidemia 2012   Hypertension    Lumbago    Lumbar post-laminectomy syndrome    PAF (paroxysmal atrial fibrillation) (HCC)    PONV (postoperative nausea and vomiting)    Sleep apnea    pt uses cpap - setting of 16   Ulcer 1966   Ulcerative colitis (HCC)    Wears glasses    Wears hearing aid     Past Surgical History:  Procedure Laterality Date   APPENDECTOMY  1993   BACK SURGERY  2008   bone spur removed    bilateral carpal tunnel release 2009     cervical fusion 2011--pt has good range of motion neck     CHOLECYSTECTOMY  1994   EYE SURGERY N/A    [employer] 07/13/2020   INSERTION OF MESH Bilateral 07/31/2016   Procedure: INSERTION OF MESH;  Surgeon: Luretha Murphy, MD;  Location: [facility];  Service: General;  Laterality: Bilateral;   JOINT REPLACEMENT Bilateral    2001 left total knee and 1985 right total knee   LUMBAR LAMINECTOMY/DECOMPRESSION MICRODISCECTOMY N/A 07/24/2021   Procedure: DECOMPRESSION AND REMOVAL OF CYST LUMBAR FIVE THROUGH SACRAL ONE;  Surgeon: Venita Lick, MD;  Location: [facility] OR;  Service: Orthopedics;  Laterality: N/A;   MASS EXCISION Left 01/04/2014   Procedure: LEFT MIDDLE FINGER MASS EXCISION WITH NAILBED REPAIR AND ADVANCEMENT FLAP;  Surgeon: Dominica Severin, MD;  Location:  [facility];  Service: Orthopedics;  Laterality: Left;   mass removed from 2nd finger Left 03/2013   squamous carcinoma   nissen fundoplication 1995     PROSTATE SURGERY  2014   TURP   right wrist fusion 12/12 - pt wearing brace on the wrist-not started physical therapy yet Right    fusion prevent limited ROM to right wrist   SPINE SURGERY  2009   cervical spine fusion   thyroid nodule     removed   TOTAL KNEE REVISION Right 11/17/2013   Procedure: RIGHT KNEE POLYETHYLENE REVISION, bone graft;  Surgeon: Loanne Drilling, MD;  Location: [facility];  Service: Orthopedics;  Laterality: Right;   TOTAL SHOULDER ARTHROPLASTY Right 01/28/2018   Procedure: RIGHT TOTAL  SHOULDER ARTHROPLASTY;  Surgeon: Beverely Low, MD;  Location[facility] OR;  Service: Orthopedics;  Laterality: Right;   TOTAL SHOULDER ARTHROPLASTY Left 07/15/2018   Procedure: LEFT SHOULDER ANATOMIC TOTAL SHOULDER ARTHROPLASTY;  Surgeon: Beverely Low, MD;  Location: [facility] OR;  Service: Orthopedics;  Laterality: Left;   TRANSURETHRAL RESECTION OF PROSTATE  10/09/2011   Procedure: TRANSURETHRAL RESECTION OF THE PROSTATE WITH GYRUS INSTRUMENTS;  Surgeon: Antony Haste, MD;  Location: [facility];  Service: Urology;  Laterality: N/A;   uvvvp     XI ROBOTIC ASSISTED INGUINAL HERNIA REPAIR WITH MESH Bilateral 07/31/2016   Procedure: XI ROBOTIC BILATERAL INGUINAL HERNIA REPAIR WITH MESH;  Surgeon: Luretha Murphy, MD;  Location: [facility];  Service: General;  Laterality: Bilateral;    Current Outpatient Medications  Medication Sig Dispense Refill   albuterol (VENTOLIN HFA) 108 (90 Base) MCG/ACT inhaler Inhale 2 puffs into the lungs every 4 (four) hours as needed for wheezing or shortness of breath. 18 g 2   atorvastatin (LIPITOR) 40 MG tablet Take 1 tablet (40 mg total) by mouth daily. 90 tablet 3   Azelastine HCl (ASTEPRO) 0.15 % SOLN Place 1 spray into the nose every morning.     budesonide-formoterol (SYMBICORT) 80-4.5 MCG/ACT inhaler Inhale 2 puffs into the lungs 2 (two) times daily. 1 each 12   Calcium Carbonate-Vitamin D (CALCIUM + D PO) Take 1 tablet by mouth at bedtime.      fentaNYL (DURAGESIC - DOSED MCG/HR) 25 MCG/HR Place 25 mcg onto the skin every 3 (three) days.      fluticasone (FLONASE) 50 MCG/ACT nasal spray Place 1 spray into both nostrils at bedtime.     hydroxypropyl methylcellulose / hypromellose (ISOPTO TEARS / GONIOVISC) 2.5 % ophthalmic solution Place 1 drop into both eyes 3 (three) times daily as needed for dry eyes.     levocetirizine (XYZAL) 5 MG tablet TAKE 1 TABLET BY MOUTH EVERY EVENING 90 tablet 1   meclizine (ANTIVERT) 25 MG tablet Take 1 tablet (25 mg total) by mouth 3  (three) times daily as needed for dizziness. 30 tablet 0   PRESCRIPTION MEDICATION Inhale into the lungs at bedtime. CPAP     sodium chloride (OCEAN) 0.65 % SOLN nasal spray Place 1 spray into both nostrils as needed for congestion. (Patient not taking: Reported on 04/28/2022) 88 mL 0   traZODone (DESYREL) 50 MG tablet Take 100 mg by mouth at bedtime.     vortioxetine HBr (TRINTELLIX) 10 MG TABS tablet Take 1 tablet (10 mg total) by mouth at bedtime. 30 tablet 5   XARELTO 20 MG TABS tablet TAKE 1 TABLET BY MOUTH DAILY WITH SUPPER 90 tablet 1   No current facility-administered medications for this visit.  Allergies as of 05/21/2022 - Review Complete 04/28/2022  Allergen Reaction Noted   Adhesive [tape] Other (See Comments) 03/23/2017   Other Other (See Comments) 10/06/2011    Family History  Problem Relation Age of Onset   Colon cancer Father    Bone cancer Father        deceased   Cancer Father    Diabetes Father    Prostate cancer Father    Arthritis Mother    Hearing loss Mother    Hyperlipidemia Mother    Hypertension Mother    Deep vein thrombosis Mother    Diabetes Sister    Hearing loss Maternal Grandmother    Diabetes Paternal Grandfather     Social History   Socioeconomic History   Marital status: Married    Spouse name: Erskine Squibb   Number of children: 1   Years of education: Not on file   Highest education level: Not on file  Occupational History   Occupation: disabled  Tobacco Use   Smoking status: Former    Packs/day: 1.00    Years: 19.00    Total pack years: 19.00    Types: Cigarettes    Quit date: 08/04/1983    Years since quitting: 38.8   Smokeless tobacco: Never   Tobacco comments:    quit smoking 1985  Vaping Use   Vaping Use: Never used  Substance and Sexual Activity   Alcohol use: Yes    Alcohol/week: 6.0 standard drinks of alcohol    Types: 6 Cans of beer per week    Comment: maybe 2 or 3 beers per week   Drug use: No   Sexual activity: Not  Currently  Other Topics Concern   Not on file  Social History Narrative   Married x 3 years in 2021.   1 daughter, 1 grand son and 1 grand daughter   Social Determinants of Health   Financial Resource Strain: Low Risk  (06/19/2021)   Overall Financial Resource Strain (CARDIA)    Difficulty of Paying Living Expenses: Not hard at all  Food Insecurity: No Food Insecurity (06/19/2021)   Hunger Vital Sign    Worried About Running Out of Food in the Last Year: Never true    Ran Out of Food in the Last Year: Never true  Transportation Needs: No Transportation Needs (06/19/2021)   PRAPARE - Administrator, Civil Service (Medical): No    Lack of Transportation (Non-Medical): No  Physical Activity: Insufficiently Active (06/19/2021)   Exercise Vital Sign    Days of Exercise per Week: 3 days    Minutes of Exercise per Session: 20 min  Stress: No Stress Concern Present (06/19/2021)   [employer] of Occupational Health - Occupational Stress Questionnaire    Feeling of Stress : Not at all  Social Connections: Socially Integrated (06/19/2021)   Social Connection and Isolation Panel [NHANES]    Frequency of Communication with Friends and Family: More than three times a week    Frequency of Social Gatherings with Friends and Family: More than three times a week    Attends Religious Services: 1 to 4 times per year    Active Member of [employer] or Organizations: Yes    Attends Engineer, structural: More than 4 times per year    Marital Status: Married  Catering manager Violence: Not At Risk (06/19/2021)   Humiliation, Afraid, Rape, and Kick questionnaire    Fear of Current or Ex-Partner: No    Emotionally Abused: No  Physically Abused: No    Sexually Abused: No    Review of Systems: Gen: Denies any fever, chills, cold or flu like symptoms, pre-syncope, or syncope.  CV: Denies chest pain, heart palpitations. Resp: Denies shortness of breath, cough.  GI: See HPI GU  : Denies urinary burning, urinary frequency, urinary hesitancy MS: Denies joint pain. Derm: Denies rash. Psych: Denies depression, anxiety. Heme: See HPI  Physical Exam: There were no vitals taken for this visit. General:   Alert and oriented. Pleasant and cooperative. Well-nourished and well-developed.  Head:  Normocephalic and atraumatic. Eyes:  Without icterus, sclera clear and conjunctiva pink.  Ears:  Normal auditory acuity. Lungs:  Clear to auscultation bilaterally. No wheezes, rales, or rhonchi. No distress.  Heart:  S1, S2 present without murmurs appreciated.  Abdomen:  +BS, soft, non-tender and non-distended. No HSM noted. No guarding or rebound. No masses appreciated.  Rectal:  Deferred  Msk:  Symmetrical without gross deformities. Normal posture. Extremities:  Without edema. Neurologic:  Alert and  oriented x4;  grossly normal neurologically. Skin:  Intact without significant lesions or rashes. Psych:  See HPI    Assessment:     Plan:  ***   Ermalinda Memos, PA-C [facility] 05/21/2022

## 2022-05-19 NOTE — H&P (View-Only) (Signed)
Referring Provider: [name], MD Primary Care Physician:  [name], MD Primary Gastroenterologist:  Dr. Abbey Chatters  Chief Complaint  Patient presents with   New Patient (Initial Visit)    Gas, bloating, stomach pain in the middle going over to left side.     HPI:   [name] is a 71 y.o. male presenting today at the request of [name], MD for history of GERD, hiatal hernia s/p Niesen fundoplication in 9326.  Patient previously followed by [facility], but requested to establish at [facility].   Today:  Has a lot of gas and bloating after eating. Also often having a BM soon after eating without specific trigger. Can be regular, loose, or diarrhea. 1-4 BMs per day. No nocturnal stools. No brbpr or melena. Rare watery stools. Has a history of IBS. However, these symptoms started 4-5 months ago. Prior to this, would have 1 BM per day. No changes in diet, lifestyle, etc. No antibiotics. Drinks well water but it is filtered.   Tried probiotics, but this did not help.   Has also noticed burning in the upper abdomen/periumbilical area and a sharp pain in the LLQ that also started 4 to 5 months ago.  Reports he started taking Prilosec 20 mg daily which has helped some with the burning, but still has it every day. Not necessarily worsened by meals.  LLQ abdominal pain has not changed.  States it is constantly there, but worsens after a bowel movement. About 8/10 in severity.   Reports he has no heartburn symptoms since Niesen fundoplication, but has noticed foul-smelling belches recently. No nausea/vomiting. Intermittent food and pill dysphagia. Prior dilation many years ago that was helpful. This started to return the last couple of months.   No NSAIDs.  No unintentional weight loss. Drinking apple cider vinegar to help with this. Started 1.5 months ago.   No history of PUD.  No personal history of UC.    Last colonoscopy 01/08/2016:  - The examined portion of the ileum was  normal. - The colon (entire examined portion) appeared normal except the very distal rectum appeared very slightly inflammed (5-6cm with subtle transition to more normal appearing mucosa). Biopsies were taken from right colon, left colon and rectum (separate jars). - The examination was otherwise normal on direct and retroflexion views. -Pathology benign. -Recommended 10-year screening colonoscopy.  EGD 07/17/2005 evidence of prior antireflux surgery, hiatal hernia, otherwise normal exam.    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   Cataracts, both eyes    worst on left   Colon polyps    Complication of anesthesia    pt had spinal for a knee replacement--"woke up" during surgery--and sick after surgery   Depression    Diverticulosis    DVT (deep venous thrombosis) ([place]) 2001 or 2002   right leg-required hospitalization x 8 days   DVT (deep venous thrombosis) ([place]) 1996   right   Glaucoma    both eyes   H/O hiatal hernia    Hearing impaired person, bilateral    bilateral  Hemorrhoid    History of IBS    History of kidney stones    x 1-passed   Hyperlipidemia 2012   Hypertension    Lumbago    Lumbar post-laminectomy syndrome    PAF (paroxysmal atrial fibrillation) (HCC)    PONV (postoperative nausea and vomiting)    Sleep apnea    pt uses cpap - setting of 16   Ulcer 1966   Ulcerative colitis ([place])    Wears glasses    Wears hearing aid     Past Surgical History:  Procedure Laterality Date   [place]  2008   bone spur removed    bilateral carpal tunnel release 2009     cervical fusion 2011--pt has good  range of motion neck     CHOLECYSTECTOMY  1994   EYE SURGERY N/A    Phreesia 07/13/2020   INSERTION OF MESH Bilateral 07/31/2016   Procedure: INSERTION OF MESH;  Surgeon: [name], MD;  Location: [facility];  Service: General;  Laterality: Bilateral;   JOINT REPLACEMENT Bilateral    2001 left total knee and 1985 right total knee   LUMBAR LAMINECTOMY/DECOMPRESSION MICRODISCECTOMY N/A 07/24/2021   Procedure: DECOMPRESSION AND REMOVAL OF CYST LUMBAR FIVE THROUGH SACRAL ONE;  Surgeon: Melina Schools, MD;  Location: [place];  Service: Orthopedics;  Laterality: N/A;   MASS EXCISION Left 01/04/2014   Procedure: LEFT MIDDLE FINGER MASS EXCISION WITH NAILBED REPAIR AND ADVANCEMENT FLAP;  Surgeon: [name], MD;  Location: [place];  Service: Orthopedics;  Laterality: Left;   mass removed from 2nd finger Left 03/2013   squamous carcinoma   nissen fundoplication 7408     PROSTATE SURGERY  2014   TURP   right wrist fusion 12/12 - pt wearing brace on the wrist-not started physical therapy yet Right    fusion prevent limited ROM to right wrist   SPINE SURGERY  2009   cervical spine fusion   thyroid nodule     removed   TOTAL KNEE REVISION Right 11/17/2013   Procedure: RIGHT KNEE POLYETHYLENE REVISION, bone graft;  Surgeon: [name], MD;  Location: [facility];  Service: Orthopedics;  Laterality: Right;   TOTAL SHOULDER ARTHROPLASTY Right 01/28/2018   Procedure: RIGHT TOTAL SHOULDER ARTHROPLASTY;  Surgeon: [name], MD;  Location: [place];  Service: Orthopedics;  Laterality: Right;   TOTAL SHOULDER ARTHROPLASTY Left 07/15/2018   Procedure: LEFT SHOULDER ANATOMIC TOTAL SHOULDER ARTHROPLASTY;  Surgeon: [name], MD;  Location: [place];  Service: Orthopedics;  Laterality: Left;   TRANSURETHRAL RESECTION OF PROSTATE  10/09/2011   Procedure: TRANSURETHRAL RESECTION OF THE PROSTATE WITH GYRUS INSTRUMENTS;  Surgeon: Fredricka Bonine, MD;  Location: [facility];  Service:  Urology;  Laterality: N/A;   uvvvp     XI ROBOTIC ASSISTED INGUINAL HERNIA REPAIR WITH MESH Bilateral 07/31/2016   Procedure: XI ROBOTIC BILATERAL INGUINAL HERNIA REPAIR WITH MESH;  Surgeon: [name], MD;  Location: [facility];  Service: General;  Laterality: Bilateral;    Current Outpatient Medications  Medication Sig Dispense Refill   albuterol (VENTOLIN HFA) 108 (90 Base) MCG/ACT inhaler Inhale 2 puffs into the lungs every 4 (four) hours as needed for wheezing or shortness of breath. 18 g 2   atorvastatin (LIPITOR) 40 MG tablet Take 1 tablet (40 mg total) by mouth daily. 90 tablet 3   Azelastine HCl (ASTEPRO) 0.15 % SOLN Place 1 spray into the nose every morning.     Calcium  Carbonate-Vitamin D (CALCIUM + D PO) Take 1 tablet by mouth at bedtime.      fentaNYL (DURAGESIC - DOSED MCG/HR) 25 MCG/HR Place 25 mcg onto the skin every 3 (three) days.      fluticasone (FLONASE) 50 MCG/ACT nasal spray Place 1 spray into both nostrils at bedtime.     hydroxypropyl methylcellulose / hypromellose (ISOPTO TEARS / GONIOVISC) 2.5 % ophthalmic solution Place 1 drop into both eyes 3 (three) times daily as needed for dry eyes.     levocetirizine (XYZAL) 5 MG tablet TAKE 1 TABLET BY MOUTH EVERY EVENING 90 tablet 1   meclizine (ANTIVERT) 25 MG tablet Take 1 tablet (25 mg total) by mouth 3 (three) times daily as needed for dizziness. 30 tablet 0   PRESCRIPTION MEDICATION Inhale into the lungs at bedtime. CPAP     sodium chloride (OCEAN) 0.65 % SOLN nasal spray Place 1 spray into both nostrils as needed for congestion. 88 mL 0   traZODone (DESYREL) 50 MG tablet Take 100 mg by mouth at bedtime.     vortioxetine HBr (TRINTELLIX) 10 MG TABS tablet Take 1 tablet (10 mg total) by mouth at bedtime. 30 tablet 5   XARELTO 20 MG TABS tablet TAKE 1 TABLET BY MOUTH DAILY WITH SUPPER 90 tablet 1   budesonide-formoterol (SYMBICORT) 80-4.5 MCG/ACT inhaler Inhale 2 puffs into the lungs 2 (two) times daily. 1 each 12   No  current facility-administered medications for this visit.    Allergies as of 05/21/2022 - Review Complete 05/21/2022  Allergen Reaction Noted   Adhesive [tape] Other (See Comments) 03/23/2017   Other Other (See Comments) 10/06/2011    Family History  Problem Relation Age of Onset   Colon cancer Father    Bone cancer Father        deceased   Cancer Father    Diabetes Father    Prostate cancer Father    Arthritis Mother    Hearing loss Mother    Hyperlipidemia Mother    Hypertension Mother    Deep vein thrombosis Mother    Diabetes Sister    Hearing loss Maternal Grandmother    Diabetes Paternal Grandfather     Social History   Socioeconomic History   Marital status: Married    Spouse name: Opal Sidles   Number of children: 1   Years of education: Not on file   Highest education level: Not on file  Occupational History   Occupation: disabled  Tobacco Use   Smoking status: Former    Packs/day: 1.00    Years: 19.00    Total pack years: 19.00    Types: Cigarettes    Quit date: 08/04/1983    Years since quitting: 38.8   Smokeless tobacco: Never   Tobacco comments:    quit smoking 1985  Vaping Use   Vaping Use: Never used  Substance and Sexual Activity   Alcohol use: Yes    Alcohol/week: 6.0 standard drinks of alcohol    Types: 6 Cans of beer per week    Comment: maybe 2 or 3 beers per week   Drug use: No   Sexual activity: Not Currently  Other Topics Concern   Not on file  Social History Narrative   Married x 3 years in 2021.   1 daughter, 1 grand son and 1 grand daughter   Social Determinants of Health   Financial Resource Strain: Low Risk  (06/19/2021)   Overall Financial Resource Strain (CARDIA)    Difficulty of  Paying Living Expenses: Not hard at all  Food Insecurity: No Food Insecurity (06/19/2021)   Hunger Vital Sign    Worried About Running Out of Food in the Last Year: Never true    Ran Out of Food in the Last Year: Never true  Transportation Needs: No  Transportation Needs (06/19/2021)   PRAPARE - Hydrologist (Medical): No    Lack of Transportation (Non-Medical): No  Physical Activity: Insufficiently Active (06/19/2021)   Exercise Vital Sign    Days of Exercise per Week: 3 days    Minutes of Exercise per Session: 20 min  Stress: No Stress Concern Present (06/19/2021)   [place]    Feeling of Stress : Not at all  Social Connections: [place] (06/19/2021)   Social Connection and Isolation Panel [NHANES]    Frequency of Communication with Friends and Family: More than three times a week    Frequency of Social Gatherings with Friends and Family: More than three times a week    Attends Religious Services: 1 to 4 times per year    Active Member of [employer] or Organizations: Yes    Attends Music therapist: More than 4 times per year    Marital Status: Married  Human resources officer Violence: Not At Risk (06/19/2021)   Humiliation, Afraid, Rape, and Kick questionnaire    Fear of Current or Ex-Partner: No    Emotionally Abused: No    Physically Abused: No    Sexually Abused: No    Review of Systems: Gen: Denies any fever, chills, cold or flu like symptoms, pre-syncope, or syncope.  CV: Denies chest pain, heart palpitations. Resp: Denies shortness of breath, cough.  GI: See HPI GU : Denies urinary burning, urinary frequency, urinary hesitancy MS: Denies joint pain. Derm: Denies rash. Psych: Denies depression, anxiety. Heme: See HPI  Physical Exam: BP (!) 144/75 (BP Location: Right Arm, Patient Position: Sitting, Cuff Size: Large)   Pulse 70   Temp 97.9 F (36.6 C) (Temporal)   Ht 5' 7"  (1.702 m)   Wt 233 lb 3.2 oz (105.8 kg)   SpO2 96%   BMI 36.52 kg/m  General:   Alert and oriented. Pleasant and cooperative. Well-nourished and well-developed.  Head:  Normocephalic and atraumatic. Eyes:  Without icterus, sclera  clear and conjunctiva pink.  Ears:  Normal auditory acuity. Lungs:  Clear to auscultation bilaterally. No wheezes, rales, or rhonchi. No distress.  Heart: Irregularly irregular rate and rhythm. Abdomen:  +BS, soft, and non-distended.  Mild TTP across upper abdomen, moderate TTP in LLQ.  No HSM noted. No guarding or rebound. No masses appreciated.  Rectal:  Deferred  Msk:  Symmetrical without gross deformities. Normal posture. Extremities:  Without edema. Neurologic:  Alert and  oriented x4;  grossly normal neurologically. Skin:  Intact without significant lesions or rashes. Psych:  See HPI    Assessment:  71 y.o. male with history of chronic back pain, prior DVT, atrial fibrillation on Xarelto, HTN, HLD, IBS, GERD, hiatal hernia s/p Niesen fundoplication in 1219, colon polyps, presenting today to establish care with multiple GI complaints including increased bowel frequency with loose stools, gas and bloating, abdominal pain, dysphagia.  Increased bowel frequency/loose stools/gas/bloating/LLQ abdominal pain:  Change in bowel habits from 1 bowel movement daily to 1-4 bowel movements daily that range from regular, to loose, to watery diarrhea, typically after eating with associated increased gas and bloating.  He has also  had new onset abdominal pain in the left lower quadrant that is constant but seems to worsen after bowel movements rather than improve.  Reports history of IBS, but had not been having any problems until the last few months.  No alarm symptoms.  No identified triggers.  No recent antibiotics. Last colonoscopy in 2017 with normal exam aside from slight inflammation of the distal rectum with benign biopsies.  Interestingly, ulcerative colitis is listed in patient's medical history, but patient denies and last colonoscopy not consistent with UC.   Overall, symptoms are not consistent with infectious diarrhea. Could be IBS, SIBO, celiac disease, or possibly related to thyroid  abnormalities. With constant LLQ abdominal pain worsening after BMs, this is less consistent with IBS, and it does raise the question of colitis/smoldering diverticulitis and can't exclude IBD or malignancy. We will update labs, screen for celiac disease, thyroid abnormalities, and arrange a CT in the near future. Further recommendations to follow.   Epigastric/periumbilical burning:  New onset x 4-5 months. Also with foul smelling belches. No PPI since Niesen fundoplication as he has had no GERD symptoms. Reports he started over-the-counter Prilosec recently which has helped somewhat with the burning.  Ulcer is listed in his past medical history, but patient denies history of this.  Denies NSAIDs.  Drinks 2 beer a day. He has been drinking apple cider vinegar in efforts to lose weight, but only started this 1.5 months ago. No brbpr, melena, nausea, or vomiting. Not particularly worsened by meals. History of cholecystectomy. Mild tenderness across entire upper abdomen on exam today.   I have recommended EGD in the near future to rule out gastritis, duodenitis, H pylori, PUD, and less likely malignancy. Will also start Protonix 40 mg daily and update labs.    Dysphagia:  New onset solid food and pill dysphagia for the last couple of months. Will arrange EGD in the near future to evaluate for esophageal web, ring, stricture, malignancy. Add possible dilation as appropriate.    Plan:  CBC, CMP, Lipase, IgA, ttg IgA, TSH CT A/P with oral and IV contrast Stop OTC prilosec and start pantoprazole 40 mg daily 30 minutes before breakfast.  Stop apple cider vinegar for now.  Avoid common gas producing items including broccoli, cabbage, cauliflower, beans, Brussels sprouts, artificial sweeteners, drinking through a straw, chewing gum, hard candy. May try Beano before meals.  Simethicone as needed.  EGD +/- dilation with propofol with Dr. Abbey Chatters in the near future pending CT as we may need to go ahead and  update colonoscopy as well.  Further recommendations pending lab and CT results.    Aliene Altes, PA-C [facility] 05/21/2022

## 2022-05-21 ENCOUNTER — Telehealth: Payer: Self-pay | Admitting: *Deleted

## 2022-05-21 ENCOUNTER — Ambulatory Visit: Payer: Medicare HMO | Admitting: Gastroenterology

## 2022-05-21 ENCOUNTER — Encounter: Payer: Self-pay | Admitting: Gastroenterology

## 2022-05-21 VITALS — BP 144/75 | HR 70 | Temp 97.9°F | Ht 67.0 in | Wt 233.2 lb

## 2022-05-21 DIAGNOSIS — R1310 Dysphagia, unspecified: Secondary | ICD-10-CM

## 2022-05-21 DIAGNOSIS — R1032 Left lower quadrant pain: Secondary | ICD-10-CM

## 2022-05-21 DIAGNOSIS — R140 Abdominal distension (gaseous): Secondary | ICD-10-CM | POA: Diagnosis not present

## 2022-05-21 DIAGNOSIS — R1013 Epigastric pain: Secondary | ICD-10-CM | POA: Diagnosis not present

## 2022-05-21 DIAGNOSIS — R195 Other fecal abnormalities: Secondary | ICD-10-CM | POA: Diagnosis not present

## 2022-05-21 DIAGNOSIS — R109 Unspecified abdominal pain: Secondary | ICD-10-CM | POA: Insufficient documentation

## 2022-05-21 MED ORDER — PANTOPRAZOLE SODIUM 40 MG PO TBEC: 40.0000 mg | DELAYED_RELEASE_TABLET | Freq: Every day | ORAL | 3 refills | Status: DC

## 2022-05-21 NOTE — Telephone Encounter (Signed)
Pt informed to go to [facility] on 05/25/22 at 6:30 am, that he will be worked in for his CT. Pt verbalized understanding.  PA #: [phone]  Cyril Mourning, NP informed that pt did not have his blood work for his thyroid completed because it was coded wrong and it would cost him $140.

## 2022-05-21 NOTE — Patient Instructions (Signed)
Have blood work completed at [facility].  We will arrange you to have a CT scan at [facility].  Stop over-the-counter Prilosec and start Protonix 40 mg daily 30 minutes before breakfast.  I have sent a new prescription to your pharmacy.  I recommend that you stop drinking apple cider vinegar for now.  Avoid common gas producing items including broccoli, cabbage, cauliflower, beans, Brussels sprouts, artificial sweeteners, drinking through a straw, chewing gum, hard candy.  You may try Beano before meals.  Use Gas-X (simethicone) as needed for gas relief.  We will have further recommendations for you pending your blood work and CT scan.  It was nice to meet you today!  Aliene Altes, PA-C [facility]

## 2022-05-22 LAB — COMPLETE METABOLIC PANEL WITH GFR
AG Ratio: 1.6 (calc) (ref 1.0–2.5)
ALT: 24 U/L (ref 9–46)
AST: 26 U/L (ref 10–35)
Albumin: 4.6 g/dL (ref 3.6–5.1)
Alkaline phosphatase (APISO): 89 U/L (ref 35–144)
BUN: 14 mg/dL (ref 7–25)
CO2: 28 mmol/L (ref 20–32)
Calcium: 9.7 mg/dL (ref 8.6–10.3)
Chloride: 104 mmol/L (ref 98–110)
Creat: 1.19 mg/dL (ref 0.70–1.28)
Globulin: 2.8 g/dL (calc) (ref 1.9–3.7)
Glucose, Bld: 148 mg/dL — ABNORMAL HIGH (ref 65–99)
Potassium: 4.1 mmol/L (ref 3.5–5.3)
Sodium: 142 mmol/L (ref 135–146)
Total Bilirubin: 0.6 mg/dL (ref 0.2–1.2)
Total Protein: 7.4 g/dL (ref 6.1–8.1)
eGFR: 64 mL/min/{1.73_m2} (ref >=60)

## 2022-05-22 LAB — CBC WITH DIFFERENTIAL/PLATELET
Absolute Monocytes: 696 cells/uL (ref 200–950)
Basophils Absolute: 59 cells/uL (ref 0–200)
Basophils Relative: 0.8 %
Eosinophils Absolute: 192 cells/uL (ref 15–500)
Eosinophils Relative: 2.6 %
HCT: 45.9 % (ref 38.5–50.0)
Hemoglobin: 16.1 g/dL (ref 13.2–17.1)
Lymphs Abs: 2050 cells/uL (ref 850–3900)
MCH: 32.3 pg (ref 27.0–33.0)
MCHC: 35.1 g/dL (ref 32.0–36.0)
MCV: 92 fL (ref 80.0–100.0)
MPV: 10.5 fL (ref 7.5–12.5)
Monocytes Relative: 9.4 %
Neutro Abs: 4403 cells/uL (ref 1500–7800)
Neutrophils Relative %: 59.5 %
Platelets: 195 10*3/uL (ref 140–400)
RBC: 4.99 10*6/uL (ref 4.20–5.80)
RDW: 12.8 % (ref 11.0–15.0)
Total Lymphocyte: 27.7 %
WBC: 7.4 10*3/uL (ref 3.8–10.8)

## 2022-05-22 LAB — LIPASE: Lipase: 41 U/L (ref 7–60)

## 2022-05-22 LAB — TISSUE TRANSGLUTAMINASE, IGA: (tTG) Ab, IgA: <1 U/mL

## 2022-05-22 LAB — IGA: Immunoglobulin A: 299 mg/dL (ref 70–320)

## 2022-05-22 LAB — ADVANCED WRITTEN NOTIFICATION (AWN) TEST REFUSAL: AWN TEST REFUSED: 899

## 2022-05-25 ENCOUNTER — Ambulatory Visit (HOSPITAL_COMMUNITY)
Admission: RE | Admit: 2022-05-25 | Discharge: 2022-05-25 | Disposition: A | Discharge status: Home / Self Care | Payer: Medicare HMO | Source: Ambulatory Visit | Attending: Gastroenterology | Admitting: Gastroenterology

## 2022-05-25 DIAGNOSIS — R1032 Left lower quadrant pain: Secondary | ICD-10-CM | POA: Diagnosis not present

## 2022-05-25 DIAGNOSIS — I700 Atherosclerosis of aorta: Secondary | ICD-10-CM | POA: Diagnosis not present

## 2022-05-25 MED ORDER — IOHEXOL 300 MG/ML  SOLN
100.0000 mL | Freq: Once | INTRAMUSCULAR | Status: AC | PRN
  Administered 2022-05-25: 100 mL via INTRAVENOUS

## 2022-05-26 DIAGNOSIS — Z5181 Encounter for therapeutic drug level monitoring: Secondary | ICD-10-CM | POA: Diagnosis not present

## 2022-05-26 DIAGNOSIS — M961 Postlaminectomy syndrome, not elsewhere classified: Secondary | ICD-10-CM | POA: Diagnosis not present

## 2022-05-26 DIAGNOSIS — M25531 Pain in right wrist: Secondary | ICD-10-CM | POA: Diagnosis not present

## 2022-05-26 DIAGNOSIS — Z79891 Long term (current) use of opiate analgesic: Secondary | ICD-10-CM | POA: Diagnosis not present

## 2022-05-28 ENCOUNTER — Telehealth: Payer: Self-pay | Admitting: *Deleted

## 2022-05-28 NOTE — Telephone Encounter (Signed)
Patient with diagnosis of A Fib on Xarelto for anticoagulation.    Procedure: colonoscopy Date of procedure: TBD   CHA2DS2-VASc Score = 3  This indicates a 3.2% annual risk of stroke. The patient's score is based upon: CHF History: 0 HTN History: 1 Diabetes History: 0 Stroke History: 0 Vascular Disease History: 1 Age Score: 1 Gender Score: 0   CrCl 64 mL/min using adj body weight Platelet count 195K   Per office protocol, patient can hold Xarelto for 2 days prior to procedure.   **This guidance is not considered finalized until pre-operative APP has relayed final recommendations.**

## 2022-05-28 NOTE — Telephone Encounter (Signed)
   Patient Name: [name]  DOB: 14-Mar-1951 MRN: [id number]  Primary Cardiologist: [name], MD  Chart reviewed as part of pre-operative protocol coverage. Pharmacy clearance only. Per office protocols and pharmacist review  [name] may hold Eliquis 2 days prior to planned procedure.   I will route this recommendation to the requesting party via Epic fax function and remove from pre-op pool.  Please call with questions.  Loel Dubonnet, NP 05/28/2022, 4:40 PM

## 2022-05-28 NOTE — Telephone Encounter (Signed)
Patient on Xarelto for atrial fibrillation. Will route to pharmacy team for input.  CHA2DS2-VASc Score = 3   This indicates a 3.2% annual risk of stroke. The patient's score is based upon: CHF History: 0 HTN History: 1 Diabetes History: 0 Stroke History: 0 Vascular Disease History: 1 Age Score: 1 Gender Score: 0     Platelet count: 195 (05/21/22)  Creatinine Clearance: 83 mL/min (by original formula) or 64 mL/min (adjusted for weight) based on labs 05/21/22  Loel Dubonnet, NP

## 2022-05-28 NOTE — Telephone Encounter (Signed)
  05/28/22  [name] 04-13-1951  What type of surgery is being performed? Colonoscopy,  esophagogastroduodenoscopy with possible esophageal dilation  When is surgery scheduled? TBD  Clearance to hold xarelto x 48 hours for procedure  Name of physician performing surgery?  Dr. Hurshel Keys [facility] Phone: [phone] Fax: [phone]  Anethesia type (none, local, MAC, general)? MAC

## 2022-05-29 NOTE — Telephone Encounter (Signed)
Reviewed. Approval received to hold Xarelto x 48 hours prior to procedure.   Please proceed with scheduling TCS/EGD +/- dilation as planned.

## 2022-06-01 ENCOUNTER — Encounter: Payer: Self-pay | Admitting: *Deleted

## 2022-06-01 MED ORDER — PEG 3350-KCL-NA BICARB-NACL 420 G PO SOLR: 4000.0000 mL | Freq: Once | ORAL | 0 refills | Status: AC

## 2022-06-01 NOTE — Telephone Encounter (Signed)
LMTRC

## 2022-06-01 NOTE — Telephone Encounter (Signed)
Pt has been scheduled for 06/18/22 at 8:15 am with Dr.Carver. Instructions and pre-op mailed. Prep sent to the pharmacy

## 2022-06-15 NOTE — Patient Instructions (Signed)
[name]  06/15/2022     @PREFPERIOPPHARMACY @   Your procedure is scheduled on  06/18/2022.   Report to [name] at  [facility]   Call this number if you have problems the morning of surgery:  [phone]  If you experience any cold or flu symptoms such as cough, fever, chills, shortness of breath, etc. between now and your scheduled surgery, please notify us at the above number.   Remember:  Follow the diet and prep instructions given to you by the office.      Your last dose of xarelto should be 11/13.      Use your inhalers before your come and bring your rescue inhaler with  you.     Take these medicines the morning of surgery with A SIP OF WATER                                          protonix.    Do not wear jewelry, make-up or nail polish.  Do not wear lotions, powders, or perfumes, or deodorant.  Do not shave 48 hours prior to surgery.  Men may shave face and neck.  Do not bring valuables to the hospital.  [name] is not responsible for any belongings or valuables.  Contacts, dentures or bridgework may not be worn into surgery.  Leave your suitcase in the car.  After surgery it may be brought to your room.  For patients admitted to the hospital, discharge time will be determined by your treatment team.  Patients discharged the day of surgery will not be allowed to drive home and must have someone with them for 24 hours.    Special instructions:   DO NOT smoke tobacco or vape for 24 hours before your procedure.  Please read over the following fact sheets that you were given. Anesthesia Post-op Instructions and Care and Recovery After Surgery      Upper Endoscopy, Adult, Care After After the procedure, it is common to have a sore throat. It is also common to have: Mild stomach pain or discomfort. Bloating. Nausea. Follow these instructions at home: The instructions below may help you care for yourself at home. Your health  care provider may give you more instructions. If you have questions, ask your health care provider. If you were given a sedative during the procedure, it can affect you for several hours. Do not drive or operate machinery until your health care provider says that it is safe. If you will be going home right after the procedure, plan to have a responsible adult: Take you home from the hospital or clinic. You will not be allowed to drive. Care for you for the time you are told. Follow instructions from your health care provider about what you may eat and drink. Return to your normal activities as told by your health care provider. Ask your health care provider what activities are safe for you. Take over-the-counter and prescription medicines only as told by your health care provider. Contact a health care provider if you: Have a sore throat that lasts longer than one day. Have trouble swallowing. Have a fever. Get help right away if you: Vomit blood or your vomit looks like coffee grounds. Have bloody, black, or tarry stools. Have a very bad sore throat or you cannot swallow. Have  difficulty breathing or very bad pain in your chest or abdomen. These symptoms may be an emergency. Get help right away. Call 911. Do not wait to see if the symptoms will go away. Do not drive yourself to the hospital. Summary After the procedure, it is common to have a sore throat, mild stomach discomfort, bloating, and nausea. If you were given a sedative during the procedure, it can affect you for several hours. Do not drive until your health care provider says that it is safe. Follow instructions from your health care provider about what you may eat and drink. Return to your normal activities as told by your health care provider. This information is not intended to replace advice given to you by your health care provider. Make sure you discuss any questions you have with your health care provider. Document  Revised: 10/29/2021 Document Reviewed: 10/29/2021 Elsevier Patient Education  [place]. Esophageal Dilatation Esophageal dilatation, also called esophageal dilation, is a procedure to widen or open a blocked or narrowed part of the esophagus. The esophagus is the part of the body that moves food and liquid from the mouth to the stomach. You may need this procedure if: You have a buildup of scar tissue in your esophagus that makes it difficult, painful, or impossible to swallow. This can be caused by gastroesophageal reflux disease (GERD). You have cancer of the esophagus. There is a problem with how food moves through your esophagus. In some cases, you may need this procedure repeated at a later time to dilate the esophagus gradually. Tell a health care provider about: Any allergies you have. All medicines you are taking, including vitamins, herbs, eye drops, creams, and over-the-counter medicines. Any problems you or family members have had with anesthetic medicines. Any blood disorders you have. Any surgeries you have had. Any medical conditions you have. Any antibiotic medicines you are required to take before dental procedures. Whether you are pregnant or may be pregnant. What are the risks? Generally, this is a safe procedure. However, problems may occur, including: Bleeding due to a tear in the lining of the esophagus. A hole, or perforation, in the esophagus. What happens before the procedure? Ask your health care provider about: Changing or stopping your regular medicines. This is especially important if you are taking diabetes medicines or blood thinners. Taking medicines such as aspirin and ibuprofen. These medicines can thin your blood. Do not take these medicines unless your health care provider tells you to take them. Taking over-the-counter medicines, vitamins, herbs, and supplements. Follow instructions from your health care provider about eating or drinking  restrictions. Plan to have a responsible adult take you home from the hospital or clinic. Plan to have a responsible adult care for you for the time you are told after you leave the hospital or clinic. This is important. What happens during the procedure? You may be given a medicine to help you relax (sedative). A numbing medicine may be sprayed into the back of your throat, or you may gargle the medicine. Your health care provider may perform the dilatation using various surgical instruments, such as: Simple dilators. This instrument is carefully placed in the esophagus to stretch it. Guided wire bougies. This involves using an endoscope to insert a wire into the esophagus. A dilator is passed over this wire to enlarge the esophagus. Then the wire is removed. Balloon dilators. An endoscope with a small balloon is inserted into the esophagus. The balloon is inflated to stretch the esophagus  and open it up. The procedure may vary among health care providers and hospitals. What can I expect after the procedure? Your blood pressure, heart rate, breathing rate, and blood oxygen level will be monitored until you leave the hospital or clinic. Your throat may feel slightly sore and numb. This will get better over time. You will not be allowed to eat or drink until your throat is no longer numb. When you are able to drink, urinate, and sit on the edge of the bed without nausea or dizziness, you may be able to return home. Follow these instructions at home: Take over-the-counter and prescription medicines only as told by your health care provider. If you were given a sedative during the procedure, it can affect you for several hours. Do not drive or operate machinery until your health care provider says that it is safe. Plan to have a responsible adult care for you for the time you are told. This is important. Follow instructions from your health care provider about any eating or drinking  restrictions. Do not use any products that contain nicotine or tobacco, such as cigarettes, e-cigarettes, and chewing tobacco. If you need help quitting, ask your health care provider. Keep all follow-up visits. This is important. Contact a health care provider if: You have a fever. You have pain that is not relieved by medicine. Get help right away if: You have chest pain. You have trouble breathing. You have trouble swallowing. You vomit blood. You have black, tarry, or bloody stools. These symptoms may represent a serious problem that is an emergency. Do not wait to see if the symptoms will go away. Get medical help right away. Call your local emergency services (911 in the [place] Do not drive yourself to the hospital. Summary Esophageal dilatation, also called esophageal dilation, is a procedure to widen or open a blocked or narrowed part of the esophagus. Plan to have a responsible adult take you home from the hospital or clinic. For this procedure, a numbing medicine may be sprayed into the back of your throat, or you may gargle the medicine. Do not drive or operate machinery until your health care provider says that it is safe. This information is not intended to replace advice given to you by your health care provider. Make sure you discuss any questions you have with your health care provider. Document Revised: 12/06/2019 Document Reviewed: 12/06/2019 Elsevier Patient Education  [place]. Colonoscopy, Adult, Care After The following information offers guidance on how to care for yourself after your procedure. Your health care provider may also give you more specific instructions. If you have problems or questions, contact your health care provider. What can I expect after the procedure? After the procedure, it is common to have: A small amount of blood in your stool for 24 hours after the procedure. Some gas. Mild cramping or bloating of your abdomen. Follow these  instructions at home: Eating and drinking  Drink enough fluid to keep your urine pale yellow. Follow instructions from your health care provider about eating or drinking restrictions. Resume your normal diet as told by your health care provider. Avoid heavy or fried foods that are hard to digest. Activity Rest as told by your health care provider. Avoid sitting for a long time without moving. Get up to take short walks every 1-2 hours. This is important to improve blood flow and breathing. Ask for help if you feel weak or unsteady. Return to your normal activities as told by  your health care provider. Ask your health care provider what activities are safe for you. Managing cramping and bloating  Try walking around when you have cramps or feel bloated. If directed, apply heat to your abdomen as told by your health care provider. Use the heat source that your health care provider recommends, such as a moist heat pack or a heating pad. Place a towel between your skin and the heat source. Leave the heat on for 20-30 minutes. Remove the heat if your skin turns bright red. This is especially important if you are unable to feel pain, heat, or cold. You have a greater risk of getting burned. General instructions If you were given a sedative during the procedure, it can affect you for several hours. Do not drive or operate machinery until your health care provider says that it is safe. For the first 24 hours after the procedure: Do not sign important documents. Do not drink alcohol. Do your regular daily activities at a slower pace than normal. Eat soft foods that are easy to digest. Take over-the-counter and prescription medicines only as told by your health care provider. Keep all follow-up visits. This is important. Contact a health care provider if: You have blood in your stool 2-3 days after the procedure. Get help right away if: You have more than a small spotting of blood in your  stool. You have large blood clots in your stool. You have swelling of your abdomen. You have nausea or vomiting. You have a fever. You have increasing pain in your abdomen that is not relieved with medicine. These symptoms may be an emergency. Get help right away. Call 911. Do not wait to see if the symptoms will go away. Do not drive yourself to the hospital. Summary After the procedure, it is common to have a small amount of blood in your stool. You may also have mild cramping and bloating of your abdomen. If you were given a sedative during the procedure, it can affect you for several hours. Do not drive or operate machinery until your health care provider says that it is safe. Get help right away if you have a lot of blood in your stool, nausea or vomiting, a fever, or increased pain in your abdomen. This information is not intended to replace advice given to you by your health care provider. Make sure you discuss any questions you have with your health care provider. Document Revised: 03/12/2021 Document Reviewed: 03/12/2021 Elsevier Patient Education  [place] After The following information offers guidance on how to care for yourself after your procedure. Your health care provider may also give you more specific instructions. If you have problems or questions, contact your health care provider. What can I expect after the procedure? After the procedure, it is common to have: Tiredness. Little or no memory about what happened during or after the procedure. Impaired judgment when it comes to making decisions. Nausea or vomiting. Some trouble with balance. Follow these instructions at home: For the time period you were told by your health care provider:  Rest. Do not participate in activities where you could fall or become injured. Do not drive or use machinery. Do not drink alcohol. Do not take sleeping pills or medicines that cause  drowsiness. Do not make important decisions or sign legal documents. Do not take care of children on your own. Medicines Take over-the-counter and prescription medicines only as told by your health care provider. If you  were prescribed antibiotics, take them as told by your health care provider. Do not stop using the antibiotic even if you start to feel better. Eating and drinking Follow instructions from your health care provider about what you may eat and drink. Drink enough fluid to keep your urine pale yellow. If you vomit: Drink clear fluids slowly and in small amounts as you are able. Clear fluids include water, ice chips, low-calorie sports drinks, and fruit juice that has water added to it (diluted fruit juice). Eat light and bland foods in small amounts as you are able. These foods include bananas, applesauce, rice, lean meats, toast, and crackers. General instructions  Have a responsible adult stay with you for the time you are told. It is important to have someone help care for you until you are awake and alert. If you have sleep apnea, surgery and some medicines can increase your risk for breathing problems. Follow instructions from your health care provider about wearing your sleep device: When you are sleeping. This includes during daytime naps. While taking prescription pain medicines, sleeping medicines, or medicines that make you drowsy. Do not use any products that contain nicotine or tobacco. These products include cigarettes, chewing tobacco, and vaping devices, such as e-cigarettes. If you need help quitting, ask your health care provider. Contact a health care provider if: You feel nauseous or vomit every time you eat or drink. You feel light-headed. You are still sleepy or having trouble with balance after 24 hours. You get a rash. You have a fever. You have redness or swelling around the IV site. Get help right away if: You have trouble breathing. You have new  confusion after you get home. These symptoms may be an emergency. Get help right away. Call 911. Do not wait to see if the symptoms will go away. Do not drive yourself to the hospital. This information is not intended to replace advice given to you by your health care provider. Make sure you discuss any questions you have with your health care provider. Document Revised: 12/15/2021 Document Reviewed: 12/15/2021 Elsevier Patient Education  [place].

## 2022-06-16 ENCOUNTER — Encounter (HOSPITAL_COMMUNITY)
Admission: RE | Admit: 2022-06-16 | Discharge: 2022-06-16 | Disposition: A | Discharge status: Home / Self Care | Payer: Medicare HMO | Source: Ambulatory Visit | Attending: Internal Medicine | Admitting: Internal Medicine

## 2022-06-16 ENCOUNTER — Other Ambulatory Visit: Payer: Self-pay

## 2022-06-16 VITALS — BP 122/74 | HR 67 | Temp 97.7°F | Resp 18 | Ht 67.0 in | Wt 233.2 lb

## 2022-06-16 DIAGNOSIS — Z01810 Encounter for preprocedural cardiovascular examination: Secondary | ICD-10-CM | POA: Insufficient documentation

## 2022-06-16 DIAGNOSIS — I10 Essential (primary) hypertension: Secondary | ICD-10-CM | POA: Diagnosis not present

## 2022-06-16 NOTE — Telephone Encounter (Signed)
Approved via [employer]here Authorization #[id number]  Tracking [id number]  Dos:06/18/22-07/17/22

## 2022-06-18 ENCOUNTER — Encounter (HOSPITAL_COMMUNITY): Payer: Self-pay

## 2022-06-18 ENCOUNTER — Other Ambulatory Visit: Payer: Self-pay

## 2022-06-18 ENCOUNTER — Ambulatory Visit (HOSPITAL_COMMUNITY)
Admission: RE | Admit: 2022-06-18 | Discharge: 2022-06-18 | Disposition: A | Discharge status: Home / Self Care | Payer: Medicare HMO | Attending: Internal Medicine | Admitting: Internal Medicine

## 2022-06-18 ENCOUNTER — Ambulatory Visit (HOSPITAL_COMMUNITY): Payer: Medicare HMO | Admitting: Anesthesiology

## 2022-06-18 ENCOUNTER — Ambulatory Visit (HOSPITAL_BASED_OUTPATIENT_CLINIC_OR_DEPARTMENT_OTHER): Payer: Medicare HMO | Admitting: Anesthesiology

## 2022-06-18 ENCOUNTER — Encounter (HOSPITAL_COMMUNITY)
Admission: RE | Disposition: A | Discharge status: Home / Self Care | Payer: Self-pay | Source: Home / Self Care | Attending: Internal Medicine

## 2022-06-18 DIAGNOSIS — K648 Other hemorrhoids: Secondary | ICD-10-CM | POA: Diagnosis not present

## 2022-06-18 DIAGNOSIS — K449 Diaphragmatic hernia without obstruction or gangrene: Secondary | ICD-10-CM | POA: Diagnosis not present

## 2022-06-18 DIAGNOSIS — K2970 Gastritis, unspecified, without bleeding: Secondary | ICD-10-CM

## 2022-06-18 DIAGNOSIS — R1032 Left lower quadrant pain: Secondary | ICD-10-CM | POA: Insufficient documentation

## 2022-06-18 DIAGNOSIS — K219 Gastro-esophageal reflux disease without esophagitis: Secondary | ICD-10-CM | POA: Insufficient documentation

## 2022-06-18 DIAGNOSIS — Z7901 Long term (current) use of anticoagulants: Secondary | ICD-10-CM | POA: Insufficient documentation

## 2022-06-18 DIAGNOSIS — F32A Depression, unspecified: Secondary | ICD-10-CM | POA: Insufficient documentation

## 2022-06-18 DIAGNOSIS — R194 Change in bowel habit: Secondary | ICD-10-CM | POA: Insufficient documentation

## 2022-06-18 DIAGNOSIS — Z87891 Personal history of nicotine dependence: Secondary | ICD-10-CM | POA: Insufficient documentation

## 2022-06-18 DIAGNOSIS — D125 Benign neoplasm of sigmoid colon: Secondary | ICD-10-CM

## 2022-06-18 DIAGNOSIS — I4891 Unspecified atrial fibrillation: Secondary | ICD-10-CM | POA: Insufficient documentation

## 2022-06-18 DIAGNOSIS — I10 Essential (primary) hypertension: Secondary | ICD-10-CM | POA: Insufficient documentation

## 2022-06-18 DIAGNOSIS — D124 Benign neoplasm of descending colon: Secondary | ICD-10-CM | POA: Insufficient documentation

## 2022-06-18 DIAGNOSIS — R1013 Epigastric pain: Secondary | ICD-10-CM | POA: Diagnosis not present

## 2022-06-18 DIAGNOSIS — I480 Paroxysmal atrial fibrillation: Secondary | ICD-10-CM | POA: Diagnosis not present

## 2022-06-18 DIAGNOSIS — G4730 Sleep apnea, unspecified: Secondary | ICD-10-CM | POA: Insufficient documentation

## 2022-06-18 DIAGNOSIS — R1310 Dysphagia, unspecified: Secondary | ICD-10-CM

## 2022-06-18 DIAGNOSIS — K635 Polyp of colon: Secondary | ICD-10-CM | POA: Diagnosis not present

## 2022-06-18 HISTORY — PX: ESOPHAGOGASTRODUODENOSCOPY (EGD) WITH PROPOFOL: SHX5813

## 2022-06-18 HISTORY — PX: BIOPSY: SHX5522

## 2022-06-18 HISTORY — PX: COLONOSCOPY WITH PROPOFOL: SHX5780

## 2022-06-18 HISTORY — PX: POLYPECTOMY: SHX5525

## 2022-06-18 HISTORY — PX: BALLOON DILATION: SHX5330

## 2022-06-18 SURGERY — COLONOSCOPY WITH PROPOFOL
Anesthesia: General

## 2022-06-18 MED ORDER — LACTATED RINGERS IV SOLN: INTRAVENOUS | Status: DC | PRN

## 2022-06-18 MED ORDER — LACTATED RINGERS IV SOLN: INTRAVENOUS | Status: DC

## 2022-06-18 MED ORDER — LIDOCAINE HCL (CARDIAC) PF 100 MG/5ML IV SOSY
PREFILLED_SYRINGE | INTRAVENOUS | Status: DC | PRN
  Administered 2022-06-18: 100 mg via INTRAVENOUS

## 2022-06-18 MED ORDER — PROPOFOL 500 MG/50ML IV EMUL
INTRAVENOUS | Status: DC | PRN
  Administered 2022-06-18: 200 ug/kg/min via INTRAVENOUS

## 2022-06-18 MED ORDER — STERILE WATER FOR IRRIGATION IR SOLN
Status: DC | PRN
  Administered 2022-06-18: 240 mL

## 2022-06-18 MED ORDER — PROPOFOL 10 MG/ML IV BOLUS
INTRAVENOUS | Status: DC | PRN
  Administered 2022-06-18: 50 mg via INTRAVENOUS

## 2022-06-18 NOTE — Transfer of Care (Signed)
Immediate Anesthesia Transfer of Care Note  Patient: [name]  Procedure(s) Performed: COLONOSCOPY WITH PROPOFOL ESOPHAGOGASTRODUODENOSCOPY (EGD) WITH PROPOFOL BALLOON DILATION BIOPSY POLYPECTOMY  Patient Location: Short Stay  Anesthesia Type:General  Level of Consciousness: awake, alert , oriented, and patient cooperative  Airway & Oxygen Therapy: Patient Spontanous Breathing  Post-op Assessment: Report given to RN, Post -op Vital signs reviewed and stable, and Patient moving all extremities X 4  Post vital signs: Reviewed and stable  Last Vitals:  Vitals Value Taken Time  BP    Temp    Pulse    Resp    SpO2      Last Pain:  Vitals:   06/18/22 0822  PainSc: 6          Complications: No notable events documented.

## 2022-06-18 NOTE — Op Note (Signed)
[facility] Patient Name: [name] Procedure Date: 06/18/2022 8:33 AM MRN: [id number] Date of Birth: 1950/08/30 Attending MD: Elon Alas. Abbey Chatters , [place], [phone] CSN: [id number] Age: 71 Admit Type: Outpatient Procedure:                Colonoscopy Indications:              Abdominal pain in the left lower quadrant, Change                            in bowel habits Providers:                Elon Alas. Abbey Chatters, DO, Lambert Mody, Dereck Leep, Technician Referring MD:              Medicines:                See the Anesthesia note for documentation of the                            administered medications Complications:            No immediate complications. Estimated Blood Loss:     Estimated blood loss was minimal. Procedure:                Pre-Anesthesia Assessment:                           - The anesthesia plan was to use monitored                            anesthesia care (MAC).                           After obtaining informed consent, the colonoscope                            was passed under direct vision. Throughout the                            procedure, the patient's blood pressure, pulse, and                            oxygen saturations were monitored continuously. The                            [id number]-HQ190L ([id number]) scope was introduced through                            the anus and advanced to the the cecum, identified                            by appendiceal orifice and ileocecal valve. The                            colonoscopy was performed without  difficulty. The                            patient tolerated the procedure well. The quality                            of the bowel preparation was evaluated using the                            BBPS [facility] Bowel Preparation Scale) with scores                            of: Right Colon = 3, Transverse Colon = 3 and Left                            Colon = 3 (entire  mucosa seen well with no residual                            staining, small fragments of stool or opaque                            liquid). The total BBPS score equals 9. Scope In: 8:35:43 AM Scope Out: 8:51:29 AM Scope Withdrawal Time: 0 hours 13 minutes 38 seconds  Total Procedure Duration: 0 hours 15 minutes 46 seconds  Findings:      The perianal and digital rectal examinations were normal.      Non-bleeding internal hemorrhoids were found during endoscopy.      Three sessile polyps were found in the descending colon. The polyps were       4 to 6 mm in size. These polyps were removed with a cold snare.       Resection and retrieval were complete.      A 4 mm polyp was found in the sigmoid colon. The polyp was sessile. The       polyp was removed with a cold snare. Resection and retrieval were       complete.      The exam was otherwise without abnormality. Impression:               - Non-bleeding internal hemorrhoids.                           - Three 4 to 6 mm polyps in the descending colon,                            removed with a cold snare. Resected and retrieved.                           - One 4 mm polyp in the sigmoid colon, removed with                            a cold snare. Resected and retrieved.                           - The examination was otherwise normal. Moderate  Sedation:      Per Anesthesia Care Recommendation:           - Patient has a contact number available for                            emergencies. The signs and symptoms of potential                            delayed complications were discussed with the                            patient. Return to normal activities tomorrow.                            Written discharge instructions were provided to the                            patient.                           - Resume previous diet.                           - Continue present medications.                           - Await pathology results.                            - Repeat colonoscopy in 5 years for surveillance.                           - Return to GI clinic in 3 months. Procedure Code(s):        --- Professional ---                           [phone], Colonoscopy, flexible; with removal of                            tumor(s), polyp(s), or other lesion(s) by snare                            technique Diagnosis Code(s):        --- Professional ---                           D12.4, Benign neoplasm of descending colon                           D12.5, Benign neoplasm of sigmoid colon                           K64.8, Other hemorrhoids                           R10.32, Left lower quadrant pain  R19.4, Change in bowel habit CPT copyright 2022 American Medical Association. All rights reserved. The codes documented in this report are preliminary and upon coder review may  be revised to meet current compliance requirements. Elon Alas. Abbey Chatters, DO [place] Abbey Chatters, DO 06/18/2022 8:53:51 AM This report has been signed electronically. Number of Addenda: 0

## 2022-06-18 NOTE — Op Note (Signed)
[facility] Patient Name: [name] Procedure Date: 06/18/2022 8:13 AM MRN: [id number] Date of Birth: 06/19/1951 Attending MD: Elon Alas. Abbey Chatters , [place], [id number] CSN: [id number] Age: 71 Admit Type: Outpatient Procedure:                Upper GI endoscopy Indications:              Epigastric abdominal pain, Dysphagia Providers:                Elon Alas. Abbey Chatters, DO, Lambert Mody, Dereck Leep, Technician Referring MD:              Medicines:                See the Anesthesia note for documentation of the                            administered medications Complications:            No immediate complications. Estimated Blood Loss:     Estimated blood loss was minimal. Procedure:                Pre-Anesthesia Assessment:                           - The anesthesia plan was to use monitored                            anesthesia care (MAC).                           After obtaining informed consent, the endoscope was                            passed under direct vision. Throughout the                            procedure, the patient's blood pressure, pulse, and                            oxygen saturations were monitored continuously. The                            GIF-H190 ([id number]) scope was introduced through the                            mouth, and advanced to the second part of duodenum.                            The upper GI endoscopy was accomplished without                            difficulty. The patient tolerated the procedure                            well. Scope  In: 8:26:22 AM Scope Out: 8:31:15 AM Total Procedure Duration: 0 hours 4 minutes 53 seconds  Findings:      Evidence of a Nissen fundoplication was found at the gastroesophageal       junction. The wrap appeared intact. This was traversed. A TTS dilator       was passed through the scope. Dilation with an 18-19-20 mm balloon       dilator was performed to 20 mm.  The dilation site was examined and       showed mild mucosal disruption and moderate improvement in luminal       narrowing.      Patchy mild inflammation characterized by erythema was found in the       gastric body and in the gastric antrum. Biopsies were taken with a cold       forceps for Helicobacter pylori testing.      The duodenal bulb, first portion of the duodenum and second portion of       the duodenum were normal. Impression:               - A Nissen fundoplication was found. The wrap                            appears intact. Dilated.                           - Gastritis. Biopsied.                           - Normal duodenal bulb, first portion of the                            duodenum and second portion of the duodenum. Moderate Sedation:      Per Anesthesia Care Recommendation:           - Patient has a contact number available for                            emergencies. The signs and symptoms of potential                            delayed complications were discussed with the                            patient. Return to normal activities tomorrow.                            Written discharge instructions were provided to the                            patient.                           - Resume previous diet.                           - Continue present medications.                           -  Await pathology results.                           - Repeat upper endoscopy PRN for retreatment.                           - Return to GI clinic in 3 months.                           - Use Protonix (pantoprazole) 40 mg PO daily. Procedure Code(s):        --- Professional ---                           [phone], Esophagogastroduodenoscopy, flexible,                            transoral; with transendoscopic balloon dilation of                            esophagus (less than 30 mm diameter)                           43239, 59, Esophagogastroduodenoscopy, flexible,                             transoral; with biopsy, single or multiple Diagnosis Code(s):        --- Professional ---                           B58.309, Other specified postprocedural states                           K29.70, Gastritis, unspecified, without bleeding                           R10.13, Epigastric pain                           R13.10, Dysphagia, unspecified CPT copyright 2022 American Medical Association. All rights reserved. The codes documented in this report are preliminary and upon coder review may  be revised to meet current compliance requirements. Elon Alas. Abbey Chatters, DO Elsie Abbey Chatters, DO 06/18/2022 8:33:45 AM This report has been signed electronically. Number of Addenda: 0

## 2022-06-18 NOTE — Anesthesia Postprocedure Evaluation (Signed)
Anesthesia Post Note  Patient: [name]  Procedure(s) Performed: COLONOSCOPY WITH PROPOFOL ESOPHAGOGASTRODUODENOSCOPY (EGD) WITH PROPOFOL BALLOON DILATION BIOPSY POLYPECTOMY  Patient location during evaluation: Phase II Anesthesia Type: General Level of consciousness: awake and alert Pain management: pain level controlled Vital Signs Assessment: post-procedure vital signs reviewed and stable Respiratory status: spontaneous breathing, nonlabored ventilation, respiratory function stable and patient connected to nasal cannula oxygen Cardiovascular status: blood pressure returned to baseline and stable Postop Assessment: no apparent nausea or vomiting Anesthetic complications: no   No notable events documented.   Last Vitals:  Vitals:   06/18/22 0705 06/18/22 0856  BP: 124/79 101/74  Pulse: 67 67  Resp: 15 18  Temp: 37.1 C 36.9 C  SpO2: 95% 93%    Last Pain:  Vitals:   06/18/22 0856  TempSrc: Oral  PainSc: 0-No pain                 Marlina Cataldi Clyde Canterbury

## 2022-06-18 NOTE — Discharge Instructions (Addendum)
EGD Discharge instructions Please read the instructions outlined below and refer to this sheet in the next few weeks. These discharge instructions provide you with general information on caring for yourself after you leave the hospital. Your doctor may also give you specific instructions. While your treatment has been planned according to the most current medical practices available, unavoidable complications occasionally occur. If you have any problems or questions after discharge, please call your doctor. ACTIVITY You may resume your regular activity but move at a slower pace for the next 24 hours.  Take frequent rest periods for the next 24 hours.  Walking will help expel (get rid of) the air and reduce the bloated feeling in your abdomen.  No driving for 24 hours (because of the anesthesia (medicine) used during the test).  You may shower.  Do not sign any important legal documents or operate any machinery for 24 hours (because of the anesthesia used during the test).  NUTRITION Drink plenty of fluids.  You may resume your normal diet.  Begin with a light meal and progress to your normal diet.  Avoid alcoholic beverages for 24 hours or as instructed by your caregiver.  MEDICATIONS You may resume your normal medications unless your caregiver tells you otherwise.  WHAT YOU CAN EXPECT TODAY You may experience abdominal discomfort such as a feeling of fullness or "gas" pains.  FOLLOW-UP Your doctor will discuss the results of your test with you.  SEEK IMMEDIATE MEDICAL ATTENTION IF ANY OF THE FOLLOWING OCCUR: Excessive nausea (feeling sick to your stomach) and/or vomiting.  Severe abdominal pain and distention (swelling).  Trouble swallowing.  Temperature over 101 F (37.8 C).  Rectal bleeding or vomiting of blood.    Colonoscopy Discharge Instructions  Read the instructions outlined below and refer to this sheet in the next few weeks. These discharge instructions provide you with  general information on caring for yourself after you leave the hospital. Your doctor may also give you specific instructions. While your treatment has been planned according to the most current medical practices available, unavoidable complications occasionally occur.   ACTIVITY You may resume your regular activity, but move at a slower pace for the next 24 hours.  Take frequent rest periods for the next 24 hours.  Walking will help get rid of the air and reduce the bloated feeling in your belly (abdomen).  No driving for 24 hours (because of the medicine (anesthesia) used during the test).   Do not sign any important legal documents or operate any machinery for 24 hours (because of the anesthesia used during the test).  NUTRITION Drink plenty of fluids.  You may resume your normal diet as instructed by your doctor.  Begin with a light meal and progress to your normal diet. Heavy or fried foods are harder to digest and may make you feel sick to your stomach (nauseated).  Avoid alcoholic beverages for 24 hours or as instructed.  MEDICATIONS You may resume your normal medications unless your doctor tells you otherwise.  WHAT YOU CAN EXPECT TODAY Some feelings of bloating in the abdomen.  Passage of more gas than usual.  Spotting of blood in your stool or on the toilet paper.  IF YOU HAD POLYPS REMOVED DURING THE COLONOSCOPY: No aspirin products for 7 days or as instructed.  No alcohol for 7 days or as instructed.  Eat a soft diet for the next 24 hours.  FINDING OUT THE RESULTS OF YOUR TEST Not all test results are available  during your visit. If your test results are not back during the visit, make an appointment with your caregiver to find out the results. Do not assume everything is normal if you have not heard from your caregiver or the medical facility. It is important for you to follow up on all of your test results.  SEEK IMMEDIATE MEDICAL ATTENTION IF: You have more than a spotting of  blood in your stool.  Your belly is swollen (abdominal distention).  You are nauseated or vomiting.  You have a temperature over 101.  You have abdominal pain or discomfort that is severe or gets worse throughout the day.   Your EGD revealed mild amount inflammation in your stomach.  I took biopsies of this to rule out infection with a bacteria called H. pylori.  Await pathology results, my office will contact you.  Previous Niesen fundoplication was intact.  I did stretch your esophagus today.  Hopefully this helps with your swallowing.  Continue on pantoprazole daily.  Your colonoscopy revealed 4 polyp(s) which I removed successfully. Await pathology results, my office will contact you. I recommend repeating colonoscopy in 5 years for surveillance purposes.   Follow up with GI in 3 months. Office will inform you of this appointment.   I hope you have a great rest of your week!  Elon Alas. Abbey Chatters, D.O. Gastroenterology and Hepatology [facility]

## 2022-06-18 NOTE — Anesthesia Preprocedure Evaluation (Signed)
Anesthesia Evaluation  Patient identified by MRN, date of birth, ID band Patient awake    Reviewed: Allergy & Precautions, H&P , NPO status , Patient's Chart, lab work & pertinent test results, reviewed documented beta blocker date and time   History of Anesthesia Complications (+) PONV and history of anesthetic complications  Airway Mallampati: II  TM Distance: >3 FB Neck ROM: full    Dental no notable dental hx.    Pulmonary sleep apnea and Continuous Positive Airway Pressure Ventilation , former smoker   Pulmonary exam normal breath sounds clear to auscultation       Cardiovascular Exercise Tolerance: Good hypertension, + dysrhythmias Atrial Fibrillation  Rhythm:regular Rate:Normal     Neuro/Psych  PSYCHIATRIC DISORDERS  Depression    negative neurological ROS     GI/Hepatic Neg liver ROS, PUD,GERD  ,,  Endo/Other  negative endocrine ROS    Renal/GU negative Renal ROS  negative genitourinary   Musculoskeletal   Abdominal   Peds  Hematology negative hematology ROS (+)   Anesthesia Other Findings BMI 36.26  Reproductive/Obstetrics negative OB ROS                             Anesthesia Physical Anesthesia Plan  ASA: 3  Anesthesia Plan: General   Post-op Pain Management:    Induction:   PONV Risk Score and Plan:   Airway Management Planned:   Additional Equipment:   Intra-op Plan:   Post-operative Plan:   Informed Consent: I have reviewed the patients History and Physical, chart, labs and discussed the procedure including the risks, benefits and alternatives for the proposed anesthesia with the patient or authorized representative who has indicated his/her understanding and acceptance.     Dental Advisory Given  Plan Discussed with: CRNA  Anesthesia Plan Comments:         Anesthesia Quick Evaluation

## 2022-06-18 NOTE — Interval H&P Note (Signed)
History and Physical Interval Note:  06/18/2022 8:13 AM  [name]  has presented today for surgery, with the diagnosis of LLQ PAIN,EPIGASTRIC BURNING,DYSPHAGIA.  The various methods of treatment have been discussed with the patient and family. After consideration of risks, benefits and other options for treatment, the patient has consented to  Procedure(s) with comments: COLONOSCOPY WITH PROPOFOL (N/A) - 8:15 AM ESOPHAGOGASTRODUODENOSCOPY (EGD) WITH PROPOFOL (N/A) BALLOON DILATION (N/A) as a surgical intervention.  The patient's history has been reviewed, patient examined, no change in status, stable for surgery.  I have reviewed the patient's chart and labs.  Questions were answered to the patient's satisfaction.     [name]

## 2022-06-19 LAB — SURGICAL PATHOLOGY

## 2022-06-29 ENCOUNTER — Encounter (HOSPITAL_COMMUNITY): Payer: Self-pay | Admitting: Internal Medicine

## 2022-07-09 DIAGNOSIS — H401131 Primary open-angle glaucoma, bilateral, mild stage: Secondary | ICD-10-CM | POA: Diagnosis not present

## 2022-07-18 ENCOUNTER — Other Ambulatory Visit: Payer: Self-pay | Admitting: Family Medicine

## 2022-07-20 ENCOUNTER — Encounter: Payer: Self-pay | Admitting: Internal Medicine

## 2022-07-20 ENCOUNTER — Ambulatory Visit (INDEPENDENT_AMBULATORY_CARE_PROVIDER_SITE_OTHER): Payer: Medicare HMO | Admitting: Internal Medicine

## 2022-07-20 VITALS — BP 135/77 | HR 83 | Ht 67.0 in | Wt 232.0 lb

## 2022-07-20 DIAGNOSIS — I480 Paroxysmal atrial fibrillation: Secondary | ICD-10-CM | POA: Diagnosis not present

## 2022-07-20 DIAGNOSIS — R8290 Unspecified abnormal findings in urine: Secondary | ICD-10-CM

## 2022-07-20 DIAGNOSIS — R1013 Epigastric pain: Secondary | ICD-10-CM | POA: Diagnosis not present

## 2022-07-20 DIAGNOSIS — H8110 Benign paroxysmal vertigo, unspecified ear: Secondary | ICD-10-CM | POA: Diagnosis not present

## 2022-07-20 DIAGNOSIS — R300 Dysuria: Secondary | ICD-10-CM

## 2022-07-20 DIAGNOSIS — Z139 Encounter for screening, unspecified: Secondary | ICD-10-CM | POA: Diagnosis not present

## 2022-07-20 DIAGNOSIS — Z0001 Encounter for general adult medical examination with abnormal findings: Secondary | ICD-10-CM

## 2022-07-20 LAB — POCT URINALYSIS DIP (CLINITEK)
Bilirubin, UA: NEGATIVE
Blood, UA: NEGATIVE
Glucose, UA: NEGATIVE mg/dL
Ketones, POC UA: NEGATIVE mg/dL
Leukocytes, UA: NEGATIVE
Nitrite, UA: NEGATIVE
POC PROTEIN,UA: NEGATIVE
Spec Grav, UA: 1.02 (ref 1.010–1.025)
Urobilinogen, UA: 0.2 E.U./dL
pH, UA: 6 (ref 5.0–8.0)

## 2022-07-20 MED ORDER — MECLIZINE HCL 25 MG PO TABS: 25.0000 mg | ORAL_TABLET | Freq: Three times a day (TID) | ORAL | 0 refills | Status: DC | PRN

## 2022-07-20 NOTE — Assessment & Plan Note (Addendum)
He endorses recent discoloration and foul odor of urine.  Denies dysuria, increased frequency, or hesitancy.  POC UA ordered today was not consistent with infection.  PSA pending.

## 2022-07-20 NOTE — Assessment & Plan Note (Signed)
Presenting today for his annual physical exam.  Recent records and labs have been reviewed. -I recommended that he receive his second shingles vaccine at his pharmacy -Needs to be scheduled for Medicare AWV -We will plan to follow-up in 6 months

## 2022-07-20 NOTE — Patient Instructions (Signed)
It was a pleasure to see you today.  Thank you for giving [place] the opportunity to be involved in your care.  Below is a brief recap of your visit and next steps.  We will plan to see you again in 6 months.  Summary We completed your annual exam today I recommend receiving your second shingles vaccine at your pharmacy I recommend increasing protonix to 40 mg twice daily and contacting Dr. Ave Filter office for follow up. We will follow up in 6 months

## 2022-07-20 NOTE — Assessment & Plan Note (Signed)
Today he describes frequent belching with foul smelling odor.  His symptoms have not improved despite undergoing EGD with dilation and starting Protonix 40 mg daily.  EGD demonstrated gastritis, however biopsies were negative for H. pylori and malignancy. -I have recommended that he increase Protonix to 40 mg twice daily and contact GI to schedule a follow-up appointment to discuss his concerns.

## 2022-07-20 NOTE — Progress Notes (Signed)
Complete physical exam  Patient: [name]   DOB: Dec 16, 1950   71 y.o. Male  MRN: [id number]  Subjective:    Chief Complaint  Patient presents with   Annual Exam   [name] is a 71 y.o. male who presents today for a complete physical exam. He reports consuming a general diet. Home exercise routine includes walking 1 hrs per day. He generally feels fairly well. He reports sleeping well. He does have additional problems to discuss today. He endorses persistent symptoms of dyspepsia despite taking protonix 40 mg daily and undergoing EGD with dilation. He would also like to review recent EGD / colonoscopy results.  Most recent fall risk assessment:    07/20/2022    3:02 PM  [employer] in the past year? 0  Number falls in past yr: 0  Injury with Fall? 0  Risk for fall due to : No Fall Risks  Follow up Falls evaluation completed   Most recent depression screenings:    07/20/2022    3:02 PM 04/27/2022    2:01 PM  PHQ 2/9 Scores  PHQ - 2 Score 0 0   Vision:Within last year and Dental: No current dental problems and Receives regular dental care  Past Medical History:  Diagnosis Date   Allergy 2005   Arthritis    oa and ddd   Back pain, chronic    pt states he has 3 herniated disks--uses fentanyl patch for pain   Blood transfusion without reported diagnosis 1950   rH negative"at birth only"   BPH (benign prostatic hyperplasia)    frequent urination, not able to hold urine well   Cancer ([place])    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   Cataracts, both eyes    worst on left   Colon polyps    Complication of anesthesia    pt had spinal for a knee replacement--"woke up" during surgery--and sick after surgery   Depression    Diverticulosis    DVT (deep venous thrombosis) ([place]) 2001 or 2002   right leg-required hospitalization x 8 days   DVT (deep venous thrombosis) ([place]) 1996   right   Glaucoma    both eyes   H/O hiatal hernia    Hearing impaired person,  bilateral    bilateral   Hemorrhoid    History of IBS    History of kidney stones    x 1-passed   Hyperlipidemia 2012   Hypertension    Lumbago    Lumbar post-laminectomy syndrome    Myocardial infarction [facility]) December 2019   PAF (paroxysmal atrial fibrillation) ([facility])    PONV (postoperative nausea and vomiting)    Sleep apnea    pt uses cpap - setting of 16   Ulcer 1966   patient denies   Ulcerative colitis ([place])    patient denies   Wears glasses    Wears hearing aid    Past Surgical History:  Procedure Laterality Date   APPENDECTOMY  1993   BACK SURGERY  2008   bone spur removed    BALLOON DILATION N/A 06/18/2022   Procedure: BALLOON DILATION;  Surgeon: Eloise Harman, DO;  [facility];  Service: Endoscopy;  Laterality: N/A;   bilateral carpal tunnel release 2009     BIOPSY  06/18/2022   Procedure: BIOPSY;  Surgeon: Eloise Harman, DO;  [facility];  Service: Endoscopy;;   cervical fusion 2011--pt has good range of motion neck  CHOLECYSTECTOMY  1994   COLONOSCOPY WITH PROPOFOL N/A 06/18/2022   Procedure: COLONOSCOPY WITH PROPOFOL;  Surgeon: Eloise Harman, DO;  [facility];  Service: Endoscopy;  Laterality: N/A;  8:15 AM   ESOPHAGOGASTRODUODENOSCOPY (EGD) WITH PROPOFOL N/A 06/18/2022   Procedure: ESOPHAGOGASTRODUODENOSCOPY (EGD) WITH PROPOFOL;  Surgeon: Eloise Harman, DO;  [facility];  Service: Endoscopy;  Laterality: N/A;   EYE SURGERY N/A    [employer]rees[employer] 07/13/2020   HERNIA REPAIR  2008   INSERTION OF MESH Bilateral 07/31/2016   Procedure: INSERTION OF MESH;  Surgeon: [name], MD;  [facility];  Service: General;  Laterality: Bilateral;   JOINT REPLACEMENT Bilateral    2001 left total knee and 1985 right total knee   LUMBAR LAMINECTOMY/DECOMPRESSION MICRODISCECTOMY N/A 07/24/2021   Procedure: DECOMPRESSION AND REMOVAL OF CYST LUMBAR FIVE THROUGH SACRAL ONE;  Surgeon: Melina Schools, MD;   Location: [place];  Service: Orthopedics;  Laterality: N/A;   MASS EXCISION Left 01/04/2014   Procedure: LEFT MIDDLE FINGER MASS EXCISION WITH NAILBED REPAIR AND ADVANCEMENT FLAP;  Surgeon: [name], MD;  Location: [place];  Service: Orthopedics;  Laterality: Left;   mass removed from 2nd finger Left 03/2013   squamous carcinoma   nissen fundoplication 2353     POLYPECTOMY  06/18/2022   Procedure: POLYPECTOMY;  Surgeon: Eloise Harman, DO;  [facility];  Service: Endoscopy;;   PROSTATE SURGERY  2014   TURP   right wrist fusion 12/12 - pt wearing brace on the wrist-not started physical therapy yet Right    fusion prevent limited ROM to right wrist   SPINE SURGERY  2009   cervical spine fusion   thyroid nodule     removed   TOTAL KNEE REVISION Right 11/17/2013   Procedure: RIGHT KNEE POLYETHYLENE REVISION, bone graft;  Surgeon: [name], MD;  [facility];  Service: Orthopedics;  Laterality: Right;   TOTAL SHOULDER ARTHROPLASTY Right 01/28/2018   Procedure: RIGHT TOTAL SHOULDER ARTHROPLASTY;  Surgeon: [name], MD;  Location: [place];  Service: Orthopedics;  Laterality: Right;   TOTAL SHOULDER ARTHROPLASTY Left 07/15/2018   Procedure: LEFT SHOULDER ANATOMIC TOTAL SHOULDER ARTHROPLASTY;  Surgeon: [name], MD;  Location: [place];  Service: Orthopedics;  Laterality: Left;   TRANSURETHRAL RESECTION OF PROSTATE  10/09/2011   Procedure: TRANSURETHRAL RESECTION OF THE PROSTATE WITH GYRUS INSTRUMENTS;  Surgeon: Fredricka Bonine, MD;  [facility];  Service: Urology;  Laterality: N/A;   uvvvp     XI ROBOTIC ASSISTED INGUINAL HERNIA REPAIR WITH MESH Bilateral 07/31/2016   Procedure: XI ROBOTIC BILATERAL INGUINAL HERNIA REPAIR WITH MESH;  Surgeon: [name], MD;  [facility];  Service: General;  Laterality: Bilateral;   Social History   Tobacco Use   Smoking status: Former    Packs/day: 1.00    Years: 15.00    Total pack  years: 15.00    Types: Cigarettes    Quit date: 08/04/1983    Years since quitting: 38.9   Smokeless tobacco: Never   Tobacco comments:    quit smoking 1985  Vaping Use   Vaping Use: Never used  Substance Use Topics   Alcohol use: Yes    Alcohol/week: 3.0 standard drinks of alcohol    Types: 3 Cans of beer per week    Comment: 2 beer a day.   Drug use: No   Family History  Problem Relation Age of Onset   Arthritis Mother  Hearing loss Mother    Hyperlipidemia Mother    Hypertension Mother    Deep vein thrombosis Mother    Bone cancer Father        deceased   Cancer Father    Diabetes Father    Prostate cancer Father    Ulcerative colitis Father    Diabetes Sister    Hearing loss Maternal Grandmother    Hearing loss Maternal Grandfather    Diabetes Paternal Grandfather    Allergies  Allergen Reactions   Adhesive [Tape] Other (See Comments)    Causes blisters - pls use paper tape   Other Other (See Comments)    "Adhesives"Pt allergic to some tapes--pt request that we only use paper tape!!   Patient Care Team: [name], MD as PCP - General (Internal Medicine) [name], MD as PCP - Cardiology (Cardiology) Edythe Clarity, [facility] as Pharmacist (Pharmacist)   Outpatient Medications Prior to Visit  Medication Sig   acetaminophen (TYLENOL) 500 MG tablet Take 1,000 mg by mouth every 6 (six) hours as needed for moderate pain.   albuterol (VENTOLIN HFA) 108 (90 Base) MCG/ACT inhaler Inhale 2 puffs into the lungs every 4 (four) hours as needed for wheezing or shortness of breath.   atorvastatin (LIPITOR) 40 MG tablet Take 1 tablet (40 mg total) by mouth daily. (Patient taking differently: Take 40 mg by mouth at bedtime.)   Azelastine HCl (ASTEPRO) 0.15 % SOLN Place 1 spray into the nose every morning. (Patient taking differently: Place 1 spray into the nose daily as needed (allergies).)   budesonide-formoterol (SYMBICORT) 80-4.5 MCG/ACT inhaler Inhale 2  puffs into the lungs 2 (two) times daily.   Calcium Carbonate-Vitamin D (CALCIUM + D PO) Take 1 tablet by mouth at bedtime.    fentaNYL (DURAGESIC - DOSED MCG/HR) 25 MCG/HR Place 25 mcg onto the skin every 3 (three) days.    fluticasone (FLONASE) 50 MCG/ACT nasal spray Place 1 spray into both nostrils at bedtime as needed for allergies.   hydroxypropyl methylcellulose / hypromellose (ISOPTO TEARS / GONIOVISC) 2.5 % ophthalmic solution Place 1 drop into both eyes 3 (three) times daily as needed for dry eyes.   latanoprost (XALATAN) 0.005 % ophthalmic solution Place 1 drop into both eyes at bedtime.   levocetirizine (XYZAL) 5 MG tablet TAKE 1 TABLET BY MOUTH EVERY EVENING   metoprolol tartrate (LOPRESSOR) 25 MG tablet Take 25 mg by mouth as needed (when in afib).   Multiple Vitamin (MULTIVITAMIN WITH MINERALS) TABS tablet Take 1 tablet by mouth daily.   pantoprazole (PROTONIX) 40 MG tablet Take 1 tablet (40 mg total) by mouth daily before breakfast.   PRESCRIPTION MEDICATION Inhale into the lungs at bedtime. CPAP   sodium chloride (OCEAN) 0.65 % SOLN nasal spray Place 1 spray into both nostrils as needed for congestion.   traZODone (DESYREL) 50 MG tablet Take 100 mg by mouth at bedtime.   vortioxetine HBr (TRINTELLIX) 10 MG TABS tablet Take 1 tablet (10 mg total) by mouth at bedtime.   XARELTO 20 MG TABS tablet TAKE 1 TABLET BY MOUTH DAILY WITH SUPPER   [DISCONTINUED] meclizine (ANTIVERT) 25 MG tablet Take 1 tablet (25 mg total) by mouth 3 (three) times daily as needed for dizziness. (Patient taking differently: Take 25 mg by mouth at bedtime.)   No facility-administered medications prior to visit.   Review of Systems  Gastrointestinal:  Positive for abdominal pain (RLQ / periumbilical) and nausea. Negative for blood in stool, constipation, diarrhea, heartburn,  melena and vomiting.       Dyspepsia   Neurological:  Positive for dizziness and headaches.  All other systems reviewed and are  negative.     Objective:     BP 135/77   Pulse 83   Ht _0  (1.702 m)   Wt 232 lb (105.2 kg)   SpO2 92%   BMI 36.34 kg/m  BP Readings from Last 3 Encounters:  07/20/22 135/77  06/18/22 101/74  06/16/22 122/74   Physical Exam Vitals reviewed.  Constitutional:      General: He is not in acute distress.    Appearance: Normal appearance. He is obese. He is not ill-appearing.  HENT:     Head: Normocephalic and atraumatic.     Right Ear: External ear normal.     Left Ear: External ear normal.     Nose: Nose normal. No congestion or rhinorrhea.     Mouth/Throat:     Mouth: Mucous membranes are moist.     Pharynx: Oropharynx is clear.  Eyes:     Extraocular Movements: Extraocular movements intact.     Conjunctiva/sclera: Conjunctivae normal.     Pupils: Pupils are equal, round, and reactive to light.  Cardiovascular:     Rate and Rhythm: Normal rate and regular rhythm.     Pulses: Normal pulses.     Heart sounds: Normal heart sounds. No murmur heard. Pulmonary:     Effort: Pulmonary effort is normal.     Breath sounds: Normal breath sounds. No wheezing, rhonchi or rales.  Abdominal:     General: Abdomen is flat. Bowel sounds are normal. There is no distension.     Palpations: Abdomen is soft.     Tenderness: There is abdominal tenderness (RLQ).     Hernia: A hernia (large ventral hernia) is present.  Musculoskeletal:        General: No swelling or deformity. Normal range of motion.     Cervical back: Normal range of motion.  Skin:    General: Skin is warm and dry.     Capillary Refill: Capillary refill takes less than 2 seconds.  Neurological:     General: No focal deficit present.     Mental Status: He is alert and oriented to person, place, and time.     Motor: No weakness.  Psychiatric:        Mood and Affect: Mood normal.        Behavior: Behavior normal.        Thought Content: Thought content normal.    Last CBC Lab Results  Component Value Date   WBC  7.4 05/21/2022   HGB 16.1 05/21/2022   HCT 45.9 05/21/2022   MCV 92.0 05/21/2022   MCH 32.3 05/21/2022   RDW 12.8 05/21/2022   PLT 195 16/60/6004   Last metabolic panel Lab Results  Component Value Date   GLUCOSE 148 (H) 05/21/2022   NA 142 05/21/2022   K 4.1 05/21/2022   CL 104 05/21/2022   CO2 28 05/21/2022   BUN 14 05/21/2022   CREATININE 1.19 05/21/2022   EGFR 64 05/21/2022   CALCIUM 9.7 05/21/2022   PHOS 2.1 (L) 07/19/2018   PROT 7.4 05/21/2022   ALBUMIN 4.5 03/09/2022   LABGLOB 2.4 03/09/2022   AGRATIO 1.9 03/09/2022   BILITOT 0.6 05/21/2022   ALKPHOS 102 03/09/2022   AST 26 05/21/2022   ALT 24 05/21/2022   ANIONGAP 9 07/21/2021   Last lipids Lab Results  Component Value Date  CHOL 129 04/27/2022   HDL 49 04/27/2022   LDLCALC 63 04/27/2022   TRIG 86 04/27/2022   CHOLHDL 2.6 04/27/2022   Last hemoglobin A1c Lab Results  Component Value Date   HGBA1C 6.0 (H) 04/27/2022   Last thyroid functions Lab Results  Component Value Date   TSH 3.90 07/11/2020   Last vitamin D Lab Results  Component Value Date   VD25OH 85.9 04/27/2022   Last vitamin B12 and Folate Lab Results  Component Value Date   VITAMINB12 492 04/27/2022   FOLATE 16.3 04/27/2022      Assessment & Plan:    Routine Health Maintenance and Physical Exam  Immunization History  Administered Date(s) Administered   Fluad Quad(high Dose 65+) 04/06/2019, 05/01/2020, 04/24/2021, 04/26/2022   Influenza Split 07/04/2012, 03/03/2013   Influenza,inj,Quad PF,6+ Mos 05/03/2014, 04/30/2015, 04/03/2016, 04/08/2017, 04/11/2018   Influenza,inj,quad, With Preservative 05/12/2017   Moderna Sars-Covid-2 Vaccination 09/14/2019, 06/06/2020   PFIZER(Purple Top)SARS-COV-2 Vaccination 10/13/2019   Pfizer Covid-19 Vaccine Bivalent Booster 85yr & up 08/11/2021   Pneumococcal Conjugate-13 05/03/2014   Pneumococcal Polysaccharide-23 08/03/2008, 04/03/2016   Tdap 08/03/2010, 08/11/2021   Zoster Recombinat  (Shingrix) 08/11/2021   Zoster, Live 08/03/2010    Health Maintenance  Topic Date Due   Zoster Vaccines- Shingrix (2 of 2) 10/06/2021   COVID-19 Vaccine (5 - 2023-24 season) 04/03/2022   Medicare Annual Wellness (AWV)  06/19/2022   DTaP/Tdap/Td (3 - Td or Tdap) 08/12/2031   COLONOSCOPY (Pts 45-467yrInsurance coverage will need to be confirmed)  06/18/2032   Pneumonia Vaccine 6534Years old  Completed   INFLUENZA VACCINE  Completed   Hepatitis C Screening  Completed   HPV VACCINES  Aged Out    Discussed health benefits of physical activity, and encouraged him to engage in regular exercise appropriate for his age and condition.  Problem List Items Addressed This Visit       BPPV (benign paroxysmal positional vertigo) - Primary    Currently prescribed meclizine 25 mg 3 times daily as needed for treatment of vertigo.  He states his symptoms have been worse recently.  On medication review, it appears he has been taking meclizine on a nightly basis instead of as needed.  We reviewed appropriate use of meclizine today.  Refill provided.      Dyspepsia    Today he describes frequent belching with foul smelling odor.  His symptoms have not improved despite undergoing EGD with dilation and starting Protonix 40 mg daily.  EGD demonstrated gastritis, however biopsies were negative for H. pylori and malignancy. -I have recommended that he increase Protonix to 40 mg twice daily and contact GI to schedule a follow-up appointment to discuss his concerns.      Encounter for well adult exam with abnormal findings    Presenting today for his annual physical exam.  Recent records and labs have been reviewed. -I recommended that he receive his second shingles vaccine at his pharmacy -Needs to be scheduled for Medicare AWV -We will plan to follow-up in 6 months      Abnormal urine odor    He endorses recent discoloration and foul odor of urine.  He endorses occasional dysuria. Denies increased  frequency or hesitancy.  POC UA ordered today was not consistent with infection.  PSA pending.      Return in about 6 months (around 01/19/2023).     PhJohnette AbrahamMD

## 2022-07-20 NOTE — Assessment & Plan Note (Signed)
Currently prescribed meclizine 25 mg 3 times daily as needed for treatment of vertigo.  He states his symptoms have been worse recently.  On medication review, it appears he has been taking meclizine on a nightly basis instead of as needed.  We reviewed appropriate use of meclizine today.  Refill provided.

## 2022-07-21 LAB — PSA: Prostate Specific Ag, Serum: 2.8 ng/mL (ref 0.0–4.0)

## 2022-07-22 ENCOUNTER — Telehealth: Payer: Self-pay | Admitting: *Deleted

## 2022-07-22 NOTE — Telephone Encounter (Signed)
Routing to you in absence of [employer]. PA. Pt called and states he is having bad stomach cramps. I informed him that the schedule was full, but we could try and get him in as soon as possible. He stated he only wanted to see Dr. Abbey Chatters. I informed him that it would be in January and he said he would figure something else out and hung up.

## 2022-07-23 NOTE — Telephone Encounter (Signed)
Spoke to pt's wife Opal Sidles [facility]) informed her of recommendations. She voiced understanding. She states he can't be in pain forever and needs to be seen. I informed her that we can try and get him in to see Dr.Carver as soon as possible, but in the meantime if the pain becomes unbearable to proceed to the ED. He has an OV in February.

## 2022-07-23 NOTE — Telephone Encounter (Signed)
LMOM for pt to call office.

## 2022-07-29 ENCOUNTER — Ambulatory Visit: Payer: Medicare HMO | Admitting: Internal Medicine

## 2022-07-31 NOTE — Telephone Encounter (Signed)
LMOM for pt to call office.

## 2022-08-04 ENCOUNTER — Encounter: Payer: Self-pay | Admitting: Family Medicine

## 2022-08-04 ENCOUNTER — Ambulatory Visit (INDEPENDENT_AMBULATORY_CARE_PROVIDER_SITE_OTHER): Payer: Medicare HMO | Admitting: Family Medicine

## 2022-08-04 VITALS — Ht 67.0 in | Wt 221.0 lb

## 2022-08-04 DIAGNOSIS — Z0000 Encounter for general adult medical examination without abnormal findings: Secondary | ICD-10-CM

## 2022-08-04 NOTE — Progress Notes (Addendum)
Annual Wellness Visit     Patient: [name], Male    DOB: 08/21/1950, 73 y.o.   MRN: [id number]  Subjective  No chief complaint on file.   [name] is a 72 y.o. male who presents today for his Annual Wellness Visit. He reports consuming a general diet.  Brisk walk daily with his dog. He generally feels fairly well. He reports sleeping well. He does not have additional problems to discuss today.    Patient Location: At home. Provider Location: [facility] Total time spent with patient  via telehealth/audio 15 minutes   Method of visit : Audio / Telehealth visit Audio: 15 minutes spoken to the patient via telephone.  HPI  Vision:Within last year   Patient Active Problem List   Diagnosis Date Noted   Encounter for well adult exam with abnormal findings 07/20/2022   BPPV (benign paroxysmal positional vertigo) 07/20/2022   Abnormal urine odor 07/20/2022   Epigastric burning sensation 05/21/2022   Left lower quadrant abdominal pain 05/21/2022   Dysphagia 05/21/2022   Loose stools 05/21/2022   Bloating 05/21/2022   Depression, recurrent ([place]) 04/28/2022   Chronic musculoskeletal pain 04/28/2022   Preventative health care 04/28/2022   Insomnia 09/15/2021   Lumbar spinal stenosis 07/24/2021   Nonocclusive coronary atherosclerosis of native coronary artery 10/08/2019   Former smoker 09/26/2019   Cough 09/28/2018   Nasal congestion 09/28/2018   Paroxysmal atrial fibrillation ([place]) 08/25/2018   Cellulitis 07/18/2018   Essential hypertension 07/18/2018   Hyperlipidemia 07/18/2018   Allergic reaction 07/18/2018   Atrial fibrillation with RVR ([place]) 07/18/2018   H/O total shoulder replacement, left 07/15/2018   S/P shoulder replacement, right 01/28/2018   Robotic XI TAPP  bilateral inguinal hernia repair Dec 2017 07/31/2016   S/P bilateral inguinal herniorrhaphy 07/31/2016   Lumbar post-laminectomy syndrome    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   albuterol (VENTOLIN HFA) 108 (90 Base) MCG/ACT inhaler Inhale 2 puffs into the lungs every 4 (four) hours as needed for wheezing or shortness of breath.   atorvastatin (LIPITOR) 40 MG tablet Take 1 tablet (40 mg total) by mouth daily. (Patient taking differently: Take 40 mg by mouth at bedtime.)   Azelastine HCl (ASTEPRO) 0.15 % SOLN Place 1 spray into the nose every morning. (Patient taking differently: Place 1 spray into the nose daily as needed (allergies).)   budesonide-formoterol (SYMBICORT) 80-4.5 MCG/ACT inhaler Inhale 2 puffs into the lungs 2 (two) times daily.   Calcium Carbonate-Vitamin D (CALCIUM + D PO) Take 1 tablet by mouth at bedtime.    fentaNYL (DURAGESIC - DOSED MCG/HR) 25 MCG/HR Place 25 mcg onto the skin every 3 (three) days.    fluticasone (FLONASE) 50 MCG/ACT nasal spray Place 1 spray into both nostrils at bedtime as needed for allergies.   hydroxypropyl methylcellulose / hypromellose (ISOPTO TEARS / GONIOVISC) 2.5 % ophthalmic solution Place 1 drop into both eyes 3 (three) times daily as needed for dry eyes.   latanoprost (XALATAN) 0.005 % ophthalmic solution Place 1 drop into both eyes at bedtime.   levocetirizine (XYZAL) 5 MG  tablet TAKE 1 TABLET BY MOUTH EVERY EVENING   meclizine (ANTIVERT) 25 MG tablet Take 1 tablet (25 mg total) by mouth 3 (three) times daily as needed for dizziness.   metoprolol tartrate (LOPRESSOR) 25 MG tablet Take 25 mg by mouth as needed (when in afib).   Multiple Vitamin (MULTIVITAMIN WITH MINERALS) TABS tablet Take 1 tablet by mouth daily.   pantoprazole (PROTONIX) 40 MG  tablet Take 1 tablet (40 mg total) by mouth daily before breakfast.   PRESCRIPTION MEDICATION Inhale into the lungs at bedtime. CPAP   sodium chloride (OCEAN) 0.65 % SOLN nasal spray Place 1 spray into both nostrils as needed for congestion.   traZODone (DESYREL) 50 MG tablet Take 100 mg by mouth at bedtime.   vortioxetine HBr (TRINTELLIX) 10 MG TABS tablet Take 1 tablet (10 mg total) by mouth at bedtime.   XARELTO 20 MG TABS tablet TAKE 1 TABLET BY MOUTH DAILY WITH SUPPER   No facility-administered medications prior to visit.    Allergies  Allergen Reactions   Adhesive [Tape] Other (See Comments)    Causes blisters - pls use paper tape   Other Other (See Comments)    "Adhesives"Pt allergic to some tapes--pt request that we only use paper tape!!    Patient Care Team: [name], MD as PCP - General (Internal Medicine) [name], MD as PCP - Cardiology (Cardiology) Edythe Clarity, [facility] as Pharmacist (Pharmacist)  ROS      Objective  Ht '5\' 7"'  (1.702 m)   Wt 221 lb (100.2 kg)   BMI 34.61 kg/m  BP Readings from Last 3 Encounters:  07/20/22 135/77  06/18/22 101/74  06/16/22 122/74      Physical Exam    Most recent functional status assessment:    07/31/2022    5:58 PM  In your present state of health, do you have any difficulty performing the following activities:  Hearing? 1  Vision? 0  Difficulty concentrating or making decisions? 0  Walking or climbing stairs? 0  Dressing or bathing? 0  Doing errands, shopping? 0  Preparing Food and eating ? N  Using the Toilet? N  In the past six months, have you accidently leaked urine? Y  Do you have problems with loss of bowel control? N  Managing your Medications? N  Managing your Finances? N  Housekeeping or managing your Housekeeping? N   Most recent fall risk assessment:    08/04/2022    8:52 AM  Fall Risk   Falls in the past year? 0  Number falls in past yr: 0  Injury with Fall? 0  Risk  for fall due to : No Fall Risks  Follow up Falls evaluation completed    Most recent depression screenings:    08/04/2022    9:41 AM 08/04/2022    8:53 AM  PHQ 2/9 Scores  PHQ - 2 Score 0 0   Most recent cognitive screening:    06/19/2021    9:57 AM  6CIT Screen  What Year? 0 points  What month? 0 points  What time? 0 points  Count back from 20 0 points  Months in reverse 2 points  Repeat phrase 2 points  Total Score 4 points   Most recent Audit-C alcohol use screening    07/31/2022    5:58 PM  Alcohol Use Disorder Test (AUDIT)  1. How often do you have a drink containing alcohol? 2  2. How many drinks containing alcohol do you have on  a typical day when you are drinking? 0  3. How often do you have six or more drinks on one occasion? 2  AUDIT-C Score 4   A score of 3 or more in women, and 4 or more in men indicates increased risk for alcohol abuse, EXCEPT if all of the points are from question 1   Vision/Hearing Screen: No results found.  Last CBC Lab Results  Component Value Date   WBC 7.4 05/21/2022   HGB 16.1 05/21/2022   HCT 45.9 05/21/2022   MCV 92.0 05/21/2022   MCH 32.3 05/21/2022   RDW 12.8 05/21/2022   PLT 195 81/15/7262   Last metabolic panel Lab Results  Component Value Date   GLUCOSE 148 (H) 05/21/2022   NA 142 05/21/2022   K 4.1 05/21/2022   CL 104 05/21/2022   CO2 28 05/21/2022   BUN 14 05/21/2022   CREATININE 1.19 05/21/2022   EGFR 64 05/21/2022   CALCIUM 9.7 05/21/2022   PHOS 2.1 (L) 07/19/2018   PROT 7.4 05/21/2022   ALBUMIN 4.5 03/09/2022   LABGLOB 2.4 03/09/2022   AGRATIO 1.9 03/09/2022   BILITOT 0.6 05/21/2022   ALKPHOS 102 03/09/2022   AST 26 05/21/2022   ALT 24 05/21/2022   ANIONGAP 9 07/21/2021   Last lipids Lab Results  Component Value Date   CHOL 129 04/27/2022   HDL 49 04/27/2022   LDLCALC 63 04/27/2022   TRIG 86 04/27/2022   CHOLHDL 2.6 04/27/2022   Last hemoglobin A1c Lab Results  Component Value Date    HGBA1C 6.0 (H) 04/27/2022   Last thyroid functions Lab Results  Component Value Date   TSH 3.90 07/11/2020   Last vitamin D Lab Results  Component Value Date   VD25OH 85.9 04/27/2022   Last vitamin B12 and Folate Lab Results  Component Value Date   VITAMINB12 492 04/27/2022   FOLATE 16.3 04/27/2022      No results found for any visits on 08/04/22.    Assessment & Plan   Annual wellness visit done today including the all of the following: Reviewed patient's Family Medical History Reviewed and updated list of patient's medical providers Assessment of cognitive impairment was done Assessed patient's functional ability Established a written schedule for health screening [place] Completed and Reviewed  Exercise Activities and Dietary recommendations  Goals      DIET - REDUCE CALORIE INTAKE     Would like to lose weight.      DIET - REDUCE CALORIE INTAKE     High Blood Pressure - < 130/90     CARE PLAN ENTRY (see longitudinal plan of care for additional care plan information)  Current Barriers:  Controlled hypertension, complicated by hyperlipidemia, afib, and GERD. Current antihypertensive regimen: none Previous antihypertensives tried: lisinopril Last practice recorded BP readings:  BP Readings from Last 3 Encounters:  05/06/20 (!) 120/58  04/12/20 122/66  10/03/19 136/72  Current home BP readings: 109/70s average at home lately   Pharmacist Clinical Goal(s):  Over the next 6 months, patient will work with PharmD and providers to optimize antihypertensive regimen  Interventions: Inter-disciplinary care team collaboration (see longitudinal plan of care) Comprehensive medication review performed; medication list updated in the electronic medical record.  Continue to monitor blood pressure at least once daily and report any consistent readings > 130/80 to PharmD or PCP.  Patient Self Care Activities:  Patient will continue to check BP  at least once daily , document, and provide at future  appointments Patient will focus on medication adherence by using pill box. Over the next 6 months patient is to report any consistent blood pressure readings > 130/90 to PharmD or PCP.  Please see past updates related to this goal by clicking on the "Past Updates" button in the selected goal       High Cholesterol - LDL < 70     CARE PLAN ENTRY (see longitudinal plan of care for additional care plan information)  Current Barriers:  Uncontrolled hyperlipidemia, complicated by hypertension, afib, and GERD. Current antihyperlipidemic regimen: atorvastatin 45m Previous antihyperlipidemic medications tried: none Most recent lipid panel:     Component Value Date/Time   CHOL 148 05/25/2019 0854   TRIG 72 05/25/2019 0854   HDL 58 05/25/2019 0854   CHOLHDL 2.6 05/25/2019 0854   VLDL 10 07/19/2018 0323   LDLCALC 75 05/25/2019 0854   ASCVD risk enhancing conditions: age >>37 DM, HTN, CKD, CHF, current smoker 10-year ASCVD risk score: 14.3%  Pharmacist Clinical Goal(s):  Over the next 6 months, patient will work with PharmD and providers towards optimized antihyperlipidemic therapy  Interventions: Comprehensive medication review performed; medication list updated in electronic medical record.  Inter-disciplinary care team collaboration (see longitudinal plan of care) Schedule annual physical with lipid panel ASAP.  Patient Self Care Activities:  Patient will focus on medication adherence by using a pill box. Over the next 6 months patient is to schedule appointment for physical with Dr. PDennard Schaumannto get updated lipid panel to evaluate current medication therapy.  Please see past updates related to this goal by clicking on the "Past Updates" button in the selected goal       Manage My Medicine     Timeframe:  Long-Range Goal Priority:  High Start Date:    12/12/20                         Expected End Date:   06/14/21                     Follow Up Date 04/02/21   - call for medicine refill 2 or 3 days before it runs out - call if I am sick and can't take my medicine - use a pillbox to sort medicine    Why is this important?   These steps will help you keep on track with your medicines.   Notes: Patient assistance programs - Xarelto/Trintellix        Immunization History  Administered Date(s) Administered   Fluad Quad(high Dose 65+) 04/06/2019, 05/01/2020, 04/24/2021, 04/26/2022   Influenza Split 07/04/2012, 03/03/2013   Influenza,inj,Quad PF,6+ Mos 05/03/2014, 04/30/2015, 04/03/2016, 04/08/2017, 04/11/2018   Influenza,inj,quad, With Preservative 05/12/2017   Moderna Sars-Covid-2 Vaccination 09/14/2019, 06/06/2020   PFIZER(Purple Top)SARS-COV-2 Vaccination 10/13/2019   Pfizer Covid-19 Vaccine Bivalent Booster 19yr& up 08/11/2021   Pneumococcal Conjugate-13 05/03/2014   Pneumococcal Polysaccharide-23 08/03/2008, 04/03/2016   Tdap 08/03/2010, 08/11/2021   Zoster Recombinat (Shingrix) 08/11/2021   Zoster, Live 08/03/2010    Health Maintenance  Topic Date Due   Zoster Vaccines- Shingrix (2 of 2) 10/06/2021   COVID-19 Vaccine (5 - 2023-24 season) 04/03/2022   Medicare Annual Wellness (AWV)  08/05/2023   DTaP/Tdap/Td (3 - Td or Tdap) 08/12/2031   COLONOSCOPY (Pts 45-4943yrnsurance coverage will need to be confirmed)  06/18/2032   Pneumonia Vaccine 65+55ears old  Completed   INFLUENZA VACCINE  Completed   Hepatitis C Screening  Completed  HPV VACCINES  Aged Out     Discussed health benefits of physical activity, and encouraged him to engage in regular exercise appropriate for his age and condition.    Problem List Items Addressed This Visit   None Visit Diagnoses     Encounter for Medicare annual wellness exam    -  Primary       Return in 1 year (on 08/05/2023).     Renard Hamper Ria Comment, FNP

## 2022-08-26 ENCOUNTER — Other Ambulatory Visit: Payer: Self-pay | Admitting: Family Medicine

## 2022-08-29 ENCOUNTER — Other Ambulatory Visit: Payer: Self-pay | Admitting: Gastroenterology

## 2022-08-29 DIAGNOSIS — R1013 Epigastric pain: Secondary | ICD-10-CM

## 2022-09-03 ENCOUNTER — Encounter (HOSPITAL_BASED_OUTPATIENT_CLINIC_OR_DEPARTMENT_OTHER): Payer: Self-pay | Admitting: Cardiology

## 2022-09-03 ENCOUNTER — Ambulatory Visit (HOSPITAL_BASED_OUTPATIENT_CLINIC_OR_DEPARTMENT_OTHER): Payer: Medicare HMO | Admitting: Cardiology

## 2022-09-03 VITALS — BP 110/70 | HR 65 | Ht 67.0 in | Wt 232.2 lb

## 2022-09-03 DIAGNOSIS — I480 Paroxysmal atrial fibrillation: Secondary | ICD-10-CM | POA: Diagnosis not present

## 2022-09-03 DIAGNOSIS — Z7189 Other specified counseling: Secondary | ICD-10-CM | POA: Diagnosis not present

## 2022-09-03 DIAGNOSIS — I2510 Atherosclerotic heart disease of native coronary artery without angina pectoris: Secondary | ICD-10-CM

## 2022-09-03 DIAGNOSIS — E7800 Pure hypercholesterolemia, unspecified: Secondary | ICD-10-CM | POA: Diagnosis not present

## 2022-09-03 NOTE — Patient Instructions (Signed)
Medication Instructions:  Your Physician recommend you continue on your current medication as directed.    *If you need a refill on your cardiac medications before your next appointment, please call your pharmacy*  Follow-Up: At [facility], you and your health needs are our priority.  As part of our continuing mission to provide you with exceptional heart care, we have created designated Provider Care Teams.  These Care Teams include your primary Cardiologist (physician) and Advanced Practice Providers (APPs -  Physician Assistants and Nurse Practitioners) who all work together to provide you with the care you need, when you need it.  We recommend signing up for the patient portal called "MyChart".  Sign up information is provided on this After Visit Summary.  MyChart is used to connect with patients for Virtual Visits (Telemedicine).  Patients are able to view lab/test results, encounter notes, upcoming appointments, etc.  Non-urgent messages can be sent to your provider as well.   To learn more about what you can do with MyChart, go to [email or web address].    Your next appointment:   1 year(s)  Provider:   Bridgette Christopher, MD  

## 2022-09-03 NOTE — Progress Notes (Signed)
Cardiology Office Note:    Date:  09/03/2022   ID:  LINZIE [name], DOB 10-17-1950, MRN [id number]  PCP:  [name], MD  Cardiologist:  [name], MD PhD  Referring MD: [name], MD   CC: follow up  History of Present Illness:    [name] is a 72 y.o. male with a hx of nonobstructive CAD, hypertension, hyperlipidemia, prior atrial fibrillation with RVR who is seen in follow up for the evaluation and management of afib, hypertension, hyperlipidemia.  [name] was hospitalized in [id number] due to complications from urinary catheter placed during recent shoulder surgery, but he was also found to have new onset atrial fibrillation with RVR. He spontaneously converted to NSR. He was discharged on rivaroxaban.  At his last visit he complained of chronic low back pain. Fentanyl and steroidal injections were not helpful. He noted occasional left chest pains that were not persistent.   Today, he reports having episodes of atrial fibrillation, usually about every other month. When in Afib, he develops diaphoresis, rapid heart rates, and mild chest pains. His arrhythmia occurs at random times; he may be sitting in the chair and suddenly begin to sweat profusely. During his Afib he monitors his rhythm with the [employer]; he sees heart rates as high as 130-150 bpm. His heart rate has lowered to the 90's after taking two tablets of his prn metoprolol.  In clinic today his blood pressure is 110/70. At home he may occasionally see readings in the 130s-140s/70s.  Additionally he complains of dizziness with positional changes which he attributes to vertigo. He also has random, intermittent headaches normally beginning in his occipital head. Sometimes he has nocturnal LE muscle cramps.  He continues to suffer from GI cramping and nausea.    He denies any shortness of breath, or peripheral edema. No syncope, orthopnea, or PND.  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   Cataracts, both eyes    worst on left   Colon polyps    Complication of anesthesia    pt had spinal for a knee replacement--"woke up" during surgery--and sick after surgery   Depression    Diverticulosis    DVT (deep venous thrombosis) ([place]) 2001 or 2002   right leg-required hospitalization x 8 days   DVT (deep venous thrombosis) ([place]) 1996   right   Glaucoma    both eyes   H/O hiatal hernia    Hearing impaired person, bilateral    bilateral   Hemorrhoid    History of IBS    History of kidney stones    x 1-passed   Hyperlipidemia 2012   Hypertension    Lumbago    Lumbar post-laminectomy syndrome    Myocardial infarction [facility]) December 2019   PAF (paroxysmal atrial fibrillation) (HCC)    PONV (postoperative nausea and vomiting)    Sleep apnea    pt uses cpap - setting of 16   Ulcer 1966   patient denies   Ulcerative colitis [facility])    patient denies   Wears glasses  Wears hearing aid     Past Surgical History:  Procedure Laterality Date   APPENDECTOMY  1993   BACK SURGERY  2008   bone spur removed    BALLOON DILATION N/A 06/18/2022   Procedure: BALLOON DILATION;  Surgeon: Eloise Harman, DO;  [facility];  Service: Endoscopy;  Laterality: N/A;   bilateral carpal tunnel release 2009     BIOPSY  06/18/2022   Procedure: BIOPSY;  Surgeon: Eloise Harman, DO;  [facility];  Service: Endoscopy;;   cervical fusion 2011--pt has good range of motion neck     CHOLECYSTECTOMY  1994   COLONOSCOPY WITH PROPOFOL N/A 06/18/2022   Procedure: COLONOSCOPY WITH PROPOFOL;  Surgeon: Eloise Harman,  DO;  [facility];  Service: Endoscopy;  Laterality: N/A;  8:15 AM   ESOPHAGOGASTRODUODENOSCOPY (EGD) WITH PROPOFOL N/A 06/18/2022   Procedure: ESOPHAGOGASTRODUODENOSCOPY (EGD) WITH PROPOFOL;  Surgeon: Eloise Harman, DO;  [facility];  Service: Endoscopy;  Laterality: N/A;   EYE SURGERY N/A    Phreesia 07/13/2020   HERNIA REPAIR  2008   INSERTION OF MESH Bilateral 07/31/2016   Procedure: INSERTION OF MESH;  Surgeon: [name], MD;  [facility]: [facility];  Service: General;  Laterality: Bilateral;   JOINT REPLACEMENT Bilateral    2001 left total knee and 1985 right total knee   LUMBAR LAMINECTOMY/DECOMPRESSION MICRODISCECTOMY N/A 07/24/2021   Procedure: DECOMPRESSION AND REMOVAL OF CYST LUMBAR FIVE THROUGH SACRAL ONE;  Surgeon: Melina Schools, MD;  Location: [place];  Service: Orthopedics;  Laterality: N/A;   MASS EXCISION Left 01/04/2014   Procedure: LEFT MIDDLE FINGER MASS EXCISION WITH NAILBED REPAIR AND ADVANCEMENT FLAP;  Surgeon: [name], MD;  Location: [place];  Service: Orthopedics;  Laterality: Left;   mass removed from 2nd finger Left 03/2013   squamous carcinoma   nissen fundoplication Q000111Q     POLYPECTOMY  06/18/2022   Procedure: POLYPECTOMY;  Surgeon: Eloise Harman, DO;  [facility];  Service: Endoscopy;;   PROSTATE SURGERY  2014   TURP   right wrist fusion 12/12 - pt wearing brace on the wrist-not started physical therapy yet Right    fusion prevent limited ROM to right wrist   SPINE SURGERY  2009   cervical spine fusion   thyroid nodule     removed   TOTAL KNEE REVISION Right 11/17/2013   Procedure: RIGHT KNEE POLYETHYLENE REVISION, bone graft;  Surgeon: [name], MD;  [facility];  Service: Orthopedics;  Laterality: Right;   TOTAL SHOULDER ARTHROPLASTY Right 01/28/2018   Procedure: RIGHT TOTAL SHOULDER ARTHROPLASTY;  Surgeon: [name], MD;  Location: [place];  Service: Orthopedics;   Laterality: Right;   TOTAL SHOULDER ARTHROPLASTY Left 07/15/2018   Procedure: LEFT SHOULDER ANATOMIC TOTAL SHOULDER ARTHROPLASTY;  Surgeon: [name], MD;  Location: [place];  Service: Orthopedics;  Laterality: Left;   TRANSURETHRAL RESECTION OF PROSTATE  10/09/2011   Procedure: TRANSURETHRAL RESECTION OF THE PROSTATE WITH GYRUS INSTRUMENTS;  Surgeon: Fredricka Bonine, MD;  [facility];  Service: Urology;  Laterality: N/A;   uvvvp     XI ROBOTIC ASSISTED INGUINAL HERNIA REPAIR WITH MESH Bilateral 07/31/2016   Procedure: XI ROBOTIC BILATERAL INGUINAL HERNIA REPAIR WITH MESH;  Surgeon: [name], MD;  [facility];  Service: General;  Laterality: Bilateral;    Current Medications: Current Outpatient Medications on File Prior to Visit  Medication Sig   acetaminophen (TYLENOL) 500  MG tablet Take 1,000 mg by mouth every 6 (six) hours as needed for moderate pain.   albuterol (VENTOLIN HFA) 108 (90 Base) MCG/ACT inhaler Inhale 2 puffs into the lungs every 4 (four) hours as needed for wheezing or shortness of breath.   atorvastatin (LIPITOR) 40 MG tablet Take 1 tablet (40 mg total) by mouth daily. (Patient taking differently: Take 40 mg by mouth at bedtime.)   Azelastine HCl (ASTEPRO) 0.15 % SOLN Place 1 spray into the nose every morning. (Patient taking differently: Place 1 spray into the nose daily as needed (allergies).)   budesonide-formoterol (SYMBICORT) 80-4.5 MCG/ACT inhaler Inhale 2 puffs into the lungs 2 (two) times daily.   Calcium Carbonate-Vitamin D (CALCIUM + D PO) Take 1 tablet by mouth at bedtime.    fentaNYL (DURAGESIC) 12 MCG/HR Place 1 patch onto the skin every 3 (three) days.   fluticasone (FLONASE) 50 MCG/ACT nasal spray Place 1 spray into both nostrils at bedtime as needed for allergies.   hydroxypropyl methylcellulose / hypromellose (ISOPTO TEARS / GONIOVISC) 2.5 % ophthalmic solution Place 1 drop into both eyes 3 (three) times daily as needed for dry eyes.    latanoprost (XALATAN) 0.005 % ophthalmic solution Place 1 drop into both eyes at bedtime.   levocetirizine (XYZAL) 5 MG tablet TAKE 1 TABLET BY MOUTH EVERY EVENING   meclizine (ANTIVERT) 25 MG tablet Take 1 tablet (25 mg total) by mouth 3 (three) times daily as needed for dizziness.   metoprolol tartrate (LOPRESSOR) 25 MG tablet Take 25 mg by mouth as needed (when in afib).   Multiple Vitamin (MULTIVITAMIN WITH MINERALS) TABS tablet Take 1 tablet by mouth daily.   pantoprazole (PROTONIX) 40 MG tablet TAKE 1 TABLET(40 MG) BY MOUTH DAILY BEFORE BREAKFAST   PRESCRIPTION MEDICATION Inhale into the lungs at bedtime. CPAP   sodium chloride (OCEAN) 0.65 % SOLN nasal spray Place 1 spray into both nostrils as needed for congestion.   traZODone (DESYREL) 50 MG tablet Take 100 mg by mouth at bedtime.   vortioxetine HBr (TRINTELLIX) 10 MG TABS tablet Take 1 tablet (10 mg total) by mouth at bedtime.   XARELTO 20 MG TABS tablet TAKE 1 TABLET BY MOUTH DAILY WITH SUPPER   No current facility-administered medications on file prior to visit.     Allergies:   Adhesive [tape] and Other   Social History   Tobacco Use   Smoking status: Former    Packs/day: 1.00    Years: 15.00    Total pack years: 15.00    Types: Cigarettes    Quit date: 08/04/1983    Years since quitting: 39.1   Smokeless tobacco: Never   Tobacco comments:    quit smoking 1985  Vaping Use   Vaping Use: Never used  Substance Use Topics   Alcohol use: Yes    Alcohol/week: 3.0 standard drinks of alcohol    Types: 3 Cans of beer per week    Comment: 4 beers a week   Drug use: No    Family History: The patient's family history includes Arthritis in his mother; Bone cancer in his father; Cancer in his father; Deep vein thrombosis in his mother; Diabetes in his father, paternal grandfather, and sister; Hearing loss in his maternal grandfather, maternal grandmother, and mother; Hyperlipidemia in his mother; Hypertension in his mother;  Prostate cancer in his father; Ulcerative colitis in his father.  ROS:   Please see the history of present illness. (+) Diaphoresis (+) Palpitations (+) Chest pains (+)  Headaches (+) LE muscle cramps (+) Nausea (+) GI cramping All other systems are reviewed and negative.    EKGs/Labs/Other Studies Reviewed:    The following studies were reviewed today:  CT cardiac 05/25/19 FINDINGS: Coronary calcium score: The patient's coronary artery calcium score is 236, which places the patient in the 59 percentile.   Coronary arteries: Normal coronary origins.  Right dominance.   Right Coronary Artery: Dominant, large caliber vessel, gives rise to PDA and PLB. Proximal RCA has mixed calcified and noncalcified plaque with no more than 1-24% stenosis. Mid and distal RC has scattered mixed plaque but no significant stenosis.   Left Main Coronary Artery: Trivial calcified plaque, no significant stenosis.   Left Anterior Descending Coronary Artery: Scattered diffuse mixed calcified and noncalcified plaque in proximal LAD, with no more than 1-24% stenosis. Gives rise to 1 small and 1 medium caliber diagonal branches. Mid to distal LAD without significant plaque or stenosis, but caliber becomes small after D2 branch. Distal LAD wraps LV apex.   Left Circumflex Artery: Proximal LCX has mixed calcified and noncalcified plaque with no more than 1-24% stenosis. Gives rise to 2 large caliber OM branches.   Aorta: Normal size, 34 mm at the mid ascending aorta (level of the PA bifurcation) measured double oblique. Mild calcifications. No dissection.   Aortic Valve: No calcifications. Trileaflet.   Other findings:   Normal pulmonary vein drainage into the left atrium.   Normal left atrial appendage without a thrombus.   Normal size of the pulmonary artery.   IMPRESSION: 1.  Scattered nonobstructive CAD, CADRADS = 1.   2. Coronary calcium score of 236. This was 59th percentile for  age and sex matched control.   3. Normal coronary origin with right dominance.  Monitor 08/02/18 ~21 days of data recorded on [employer] monitor. Patient had a min HR of 50 bpm, max HR of 181 bpm, and avg HR of 70 bpm. Predominant underlying rhythm was Sinus Rhythm. There was one episode of rapid atrial fibrillation on 08/06/2018 with a rate of 181 bpm. Total time in atrial fibrillation was 2 hrs 33 min, longest event was 13 minutes. AF occurred 38 time(s) with HR range of 75 - 181; Total AF Burden 1%   No VT, SVT, high degree block, or pauses noted. Isolated atrial and ventricular ectopy was rare (<1%). There were 16 symptomatic and 22 asymptomatic triggered events. All symptomatic events were either normal sinus rhythm, sinus tachycardia, or sinus rhythm with a PAC.  Echo 07/19/18  - Left ventricle: The cavity size was normal. Systolic function was   normal. The estimated ejection fraction was in the range of 60%   to 65%. Wall motion was normal; there were no regional wall   motion abnormalities. Features are consistent with a pseudonormal   left ventricular filling pattern, with concomitant abnormal   relaxation and increased filling pressure (grade 2 diastolic   dysfunction). There was no evidence of elevated ventricular   filling pressure by Doppler parameters. - Aortic root: The aortic root was normal in size. - Left atrium: The atrium was moderately dilated. - Right ventricle: The cavity size was normal. Wall thickness was   normal. Systolic function was normal. - Right atrium: The atrium was normal in size. - Tricuspid valve: There was mild regurgitation. - Pulmonary arteries: Systolic pressure was within the normal   range. - Inferior vena cava: The vessel was dilated. The respirophasic   diameter changes were blunted (< 50%), consistent with elevated  central venous pressure. - Pericardium, extracardiac: There was no pericardial effusion.   MPI 2007--I cannot see complete  results  EKG:  EKG is personally reviewed. 09/03/2022:  NSR at 65 bpm 04/08/2021: NSR at 63 bpm 08/25/18: sinus bradycardia at 55 bpm.  Recent Labs: 05/21/2022: ALT 24; BUN 14; Creat 1.19; Hemoglobin 16.1; Platelets 195; Potassium 4.1; Sodium 142   Recent Lipid Panel    Component Value Date/Time   CHOL 129 04/27/2022 1455   TRIG 86 04/27/2022 1455   HDL 49 04/27/2022 1455   CHOLHDL 2.6 04/27/2022 1455   CHOLHDL 3.2 07/11/2020 0824   VLDL 10 07/19/2018 0323   LDLCALC 63 04/27/2022 1455   LDLCALC 97 07/11/2020 0824    Physical Exam:    VS:  BP 110/70 (BP Location: Left Arm, Patient Position: Sitting, Cuff Size: Large)   Pulse 65   Ht '5\' 7"'$  (1.702 m)   Wt 232 lb 3.2 oz (105.3 kg)   SpO2 96%   BMI 36.37 kg/m     Wt Readings from Last 3 Encounters:  09/03/22 232 lb 3.2 oz (105.3 kg)  08/04/22 221 lb (100.2 kg)  07/20/22 232 lb (105.2 kg)    GEN: Well nourished, well developed in no acute distress HEENT: Normal, moist mucous membranes NECK: No JVD CARDIAC: regular rhythm, normal S1 and S2, no rubs or gallops. No murmur. VASCULAR: Radial and DP pulses 2+ bilaterally. No carotid bruits RESPIRATORY:  Clear to auscultation without rales, wheezing or rhonchi  ABDOMEN: Soft, non-tender, non-distended MUSCULOSKELETAL:  Ambulates independently SKIN: Warm and dry, no edema NEUROLOGIC:  Alert and oriented x 3. No focal neuro deficits noted. PSYCHIATRIC:  Normal affect   ASSESSMENT:    1. Paroxysmal atrial fibrillation (HCC)   2. Nonocclusive coronary atherosclerosis of native coronary artery   3. Pure hypercholesterolemia   4. Cardiac risk counseling   5. Counseling on health promotion and disease prevention    PLAN:    Nonobstructive CAD: based on results of CT cardiac for chest pain -on atorvastatin 40 mg daily -no aspirin as he is on rivaroxaban -counseled on red flag warning signs that need immediate medical attention  Atrial fibrillation, with RVR in hospital and  spontaneous conversion: -CHA2DS2/VAS Stroke Risk Points=3 (HTN, HLD, age)  -on rivaroxaban for anticoagulation. Tolerating well. -regular rhythm today -asymptomatic -has instructions for PRN metoprolol   Hypertension: at goal of <130/80 on no medications -if elevates above goal in the future, did well on lisinopril, would restart  Hypercholesterolemia -LDL goal <70 with nonobstructive CAD -continue atorvastatin 40 mg -working on lifestyle, as below  Lifestyle recommendations -recommend heart healthy/Mediterranean diet, with whole grains, fruits, vegetable, fish, lean meats, nuts, and olive oil. Limit salt. -recommend moderate walking, 3-5 times/week for 30-50 minutes each session. Aim for at least 150 minutes.week. Goal should be pace of 3 miles/hours, or walking 1.5 miles in 30 minutes -recommend avoidance of tobacco products. Avoid excess alcohol.  Plan for follow up:  1 year or sooner as needed.  [name], MD, PhD [place]  [facility]   Medication Adjustments/Labs and Tests Ordered: Current medicines are reviewed at length with the patient today.  Concerns regarding medicines are outlined above.   Orders Placed This Encounter  Procedures   EKG 12-Lead   No orders of the defined types were placed in this encounter.  Patient Instructions  Medication Instructions:  Your Physician recommend you continue on your current medication as directed.    *If you need a refill on  your cardiac medications before your next appointment, please call your pharmacy*  Follow-Up: At [facility], you and your health needs are our priority.  As part of our continuing mission to provide you with exceptional heart care, we have created designated Provider Care Teams.  These Care Teams include your primary Cardiologist (physician) and Advanced Practice Providers (APPs -  Physician Assistants and Nurse Practitioners) who all work together to provide you with the care  you need, when you need it.  We recommend signing up for the patient portal called "MyChart".  Sign up information is provided on this After Visit Summary.  MyChart is used to connect with patients for Virtual Visits (Telemedicine).  Patients are able to view lab/test results, encounter notes, upcoming appointments, etc.  Non-urgent messages can be sent to your provider as well.   To learn more about what you can do with MyChart, go to [email or web address].    Your next appointment:   1 year(s)  Provider:   [name], MD     [facility],acting as a scribe for [name], MD.,have documented all relevant documentation on the behalf of [name], MD,as directed by  [name], MD while in the presence of [name], MD.  I, [name], MD, have reviewed all documentation for this visit. The documentation on 09/03/22 for the exam, diagnosis, procedures, and orders are all accurate and complete.   Signed, [name], MD PhD 09/03/2022   [place]

## 2022-09-10 ENCOUNTER — Ambulatory Visit: Payer: Medicare HMO | Admitting: Internal Medicine

## 2022-09-15 ENCOUNTER — Ambulatory Visit: Payer: Medicare HMO | Admitting: Gastroenterology

## 2022-09-18 ENCOUNTER — Emergency Department (HOSPITAL_COMMUNITY)
Admission: EM | Admit: 2022-09-18 | Discharge: 2022-09-18 | Disposition: A | Discharge status: Home / Self Care | Payer: Medicare HMO | Attending: Emergency Medicine | Admitting: Emergency Medicine

## 2022-09-18 ENCOUNTER — Emergency Department (HOSPITAL_COMMUNITY): Payer: Medicare HMO

## 2022-09-18 ENCOUNTER — Other Ambulatory Visit: Payer: Self-pay

## 2022-09-18 DIAGNOSIS — I700 Atherosclerosis of aorta: Secondary | ICD-10-CM | POA: Diagnosis not present

## 2022-09-18 DIAGNOSIS — R109 Unspecified abdominal pain: Secondary | ICD-10-CM | POA: Diagnosis not present

## 2022-09-18 DIAGNOSIS — K2970 Gastritis, unspecified, without bleeding: Secondary | ICD-10-CM

## 2022-09-18 DIAGNOSIS — Z7901 Long term (current) use of anticoagulants: Secondary | ICD-10-CM | POA: Diagnosis not present

## 2022-09-18 DIAGNOSIS — R196 Halitosis: Secondary | ICD-10-CM | POA: Diagnosis not present

## 2022-09-18 DIAGNOSIS — K760 Fatty (change of) liver, not elsewhere classified: Secondary | ICD-10-CM | POA: Diagnosis not present

## 2022-09-18 DIAGNOSIS — R1013 Epigastric pain: Secondary | ICD-10-CM

## 2022-09-18 LAB — CBC WITH DIFFERENTIAL/PLATELET
Abs Immature Granulocytes: 0.03 10*3/uL (ref 0.00–0.07)
Basophils Absolute: 0.1 10*3/uL (ref 0.0–0.1)
Basophils Relative: 1 %
Eosinophils Absolute: 0.2 10*3/uL (ref 0.0–0.5)
Eosinophils Relative: 3 %
HCT: 46 % (ref 39.0–52.0)
Hemoglobin: 16 g/dL (ref 13.0–17.0)
Immature Granulocytes: 0 %
Lymphocytes Relative: 29 %
Lymphs Abs: 2.1 10*3/uL (ref 0.7–4.0)
MCH: 31.7 pg (ref 26.0–34.0)
MCHC: 34.8 g/dL (ref 30.0–36.0)
MCV: 91.1 fL (ref 80.0–100.0)
Monocytes Absolute: 0.9 10*3/uL (ref 0.1–1.0)
Monocytes Relative: 12 %
Neutro Abs: 3.8 10*3/uL (ref 1.7–7.7)
Neutrophils Relative %: 55 %
Platelets: 168 10*3/uL (ref 150–400)
RBC: 5.05 MIL/uL (ref 4.22–5.81)
RDW: 12.4 % (ref 11.5–15.5)
WBC: 7 10*3/uL (ref 4.0–10.5)
nRBC: 0 % (ref 0.0–0.2)

## 2022-09-18 LAB — COMPREHENSIVE METABOLIC PANEL
ALT: 30 U/L (ref 0–44)
AST: 28 U/L (ref 15–41)
Albumin: 4.6 g/dL (ref 3.5–5.0)
Alkaline Phosphatase: 80 U/L (ref 38–126)
Anion gap: 9 (ref 5–15)
BUN: 14 mg/dL (ref 8–23)
CO2: 25 mmol/L (ref 22–32)
Calcium: 9.4 mg/dL (ref 8.9–10.3)
Chloride: 104 mmol/L (ref 98–111)
Creatinine, Ser: 1.17 mg/dL (ref 0.61–1.24)
GFR, Estimated: >60 mL/min (ref >60)
Glucose, Bld: 104 mg/dL — ABNORMAL HIGH (ref 70–99)
Potassium: 4.3 mmol/L (ref 3.5–5.1)
Sodium: 138 mmol/L (ref 135–145)
Total Bilirubin: 0.7 mg/dL (ref 0.3–1.2)
Total Protein: 7.7 g/dL (ref 6.5–8.1)

## 2022-09-18 LAB — LIPASE, BLOOD: Lipase: 52 U/L — ABNORMAL HIGH (ref 11–51)

## 2022-09-18 MED ORDER — IOHEXOL 300 MG/ML  SOLN
100.0000 mL | Freq: Once | INTRAMUSCULAR | Status: AC | PRN
  Administered 2022-09-18: 100 mL via INTRAVENOUS

## 2022-09-18 MED ORDER — SODIUM CHLORIDE 0.9 % IV BOLUS
1000.0000 mL | Freq: Once | INTRAVENOUS | Status: AC
  Administered 2022-09-18: 1000 mL via INTRAVENOUS

## 2022-09-18 MED ORDER — ONDANSETRON HCL 4 MG/2ML IJ SOLN
4.0000 mg | Freq: Once | INTRAMUSCULAR | Status: AC
  Administered 2022-09-18: 4 mg via INTRAVENOUS
  Filled 2022-09-18: qty 2

## 2022-09-18 MED ORDER — FAMOTIDINE IN NACL 20-0.9 MG/50ML-% IV SOLN
20.0000 mg | Freq: Once | INTRAVENOUS | Status: AC
  Administered 2022-09-18: 20 mg via INTRAVENOUS
  Filled 2022-09-18: qty 50

## 2022-09-18 MED ORDER — SUCRALFATE 1 G PO TABS: 1.0000 g | ORAL_TABLET | Freq: Three times a day (TID) | ORAL | 0 refills | Status: DC

## 2022-09-18 NOTE — ED Provider Triage Note (Signed)
Emergency Medicine Provider Triage Evaluation Note  [name] , a 72 y.o. male  was evaluated in triage.  Pt complains of increasing RUQ pain and "the smell of bowels on his breath" over the last month.  Talk to GI office and was recommended come to the ED.  Symptoms started about a month ago and continues to worsen.  Endorses varying bowel movements, including diarrhea, constipation, though denies bright red blood or black tarry stools.  Feels nauseous, though unable to vomit.  Review of Systems  Positive:  Negative: See above  Physical Exam  BP (!) 147/85 (BP Location: Left Arm)   Pulse 71   Temp 98.1 F (36.7 C) (Oral)   Resp 18   Ht 5' 7"$  (1.702 m)   Wt 105 kg   SpO2 99%   BMI 36.26 kg/m  Gen:   Awake, no distress   Resp:  Normal effort  MSK:   Moves extremities without difficulty  Other:  Sitting comfortably, not diaphoretic.  Mild RUQ tenderness, nondistended, soft.  Medical Decision Making  Medically screening exam initiated at 2:14 PM.  Appropriate orders placed.  [name] was informed that the remainder of the evaluation will be completed by another provider, this initial triage assessment does not replace that evaluation, and the importance of remaining in the ED until their evaluation is complete.     [name] Rome, PA-C [phone]

## 2022-09-18 NOTE — Discharge Instructions (Signed)
As we discussed, your CT scan did not show any acute findings.  I am suspecting you have bad gastritis or gastric ulcer.  Please continue taking Protonix  Please take Pepcid 20 mg twice daily as needed.  I have also prescribed you Carafate to take with each meal and also at bedtime  You can follow-up with Dr. Abbey Chatters or with Dr. Ardis Hughs from GI.  As we discussed, you may need another endoscopy  Return to ER if you have worse nausea or vomiting or abdominal pain or diarrhea

## 2022-09-18 NOTE — ED Provider Notes (Signed)
[place] EMERGENCY DEPARTMENT AT [facility] Provider Note   CSN: [id number] Arrival date & time: 09/18/22  1335     History  Chief Complaint  Patient presents with   Abdominal Pain    [name] is a 72 y.o. male history of Niesen fundoplication, cholecystectomy, appendectomy here presenting with abdominal pain and bad breath.  Patient states that he has been having crampy abdominal pain for the last month or so.  Patient actually had endoscopy and colonoscopy done with Dr. Abbey Chatters in November of last year.  Patient negative H. pylori test and was put on PPI.  He has been taking Protonix daily.  He states that he has not helped him.  He states that he had 3 episodes of watery diarrhea today.  He also smelled feculent material in his breath. Patient states that he is unable to vomit due to his Niesen fundoplication.  The history is provided by the patient.       Home Medications Prior to Admission medications   Medication Sig Start Date End Date Taking? Authorizing Provider  acetaminophen (TYLENOL) 500 MG tablet Take 1,000 mg by mouth every 6 (six) hours as needed for moderate pain.    [provider]  albuterol (VENTOLIN HFA) 108 (90 Base) MCG/ACT inhaler Inhale 2 puffs into the lungs every 4 (four) hours as needed for wheezing or shortness of breath. 12/11/19   [name], MD  atorvastatin (LIPITOR) 40 MG tablet Take 1 tablet (40 mg total) by mouth daily. Patient taking differently: Take 40 mg by mouth at bedtime. 10/20/21   [name], MD  Azelastine HCl (ASTEPRO) 0.15 % SOLN Place 1 spray into the nose every morning. Patient taking differently: Place 1 spray into the nose daily as needed (allergies). 04/28/22   Chesley Mires, MD  budesonide-formoterol (SYMBICORT) 80-4.5 MCG/ACT inhaler Inhale 2 puffs into the lungs 2 (two) times daily. 03/09/22   Chesley Mires, MD  Calcium Carbonate-Vitamin D (CALCIUM + D PO) Take 1 tablet by mouth at bedtime.      [provider]  fentaNYL (DURAGESIC) 12 MCG/HR Place 1 patch onto the skin every 3 (three) days. 08/29/22   [provider]  fluticasone (FLONASE) 50 MCG/ACT nasal spray Place 1 spray into both nostrils at bedtime as needed for allergies.    [provider]  hydroxypropyl methylcellulose / hypromellose (ISOPTO TEARS / GONIOVISC) 2.5 % ophthalmic solution Place 1 drop into both eyes 3 (three) times daily as needed for dry eyes.    [provider]  latanoprost (XALATAN) 0.005 % ophthalmic solution Place 1 drop into both eyes at bedtime. 05/23/22   [provider]  levocetirizine (XYZAL) 5 MG tablet TAKE 1 TABLET BY MOUTH EVERY EVENING 08/26/22   [name], MD  meclizine (ANTIVERT) 25 MG tablet Take 1 tablet (25 mg total) by mouth 3 (three) times daily as needed for dizziness. 07/20/22   [name], MD  metoprolol tartrate (LOPRESSOR) 25 MG tablet Take 25 mg by mouth as needed (when in afib).    [provider]  Multiple Vitamin (MULTIVITAMIN WITH MINERALS) TABS tablet Take 1 tablet by mouth daily.    [provider]  pantoprazole (PROTONIX) 40 MG tablet TAKE 1 TABLET(40 MG) BY MOUTH DAILY BEFORE BREAKFAST 08/31/22   Rourk, Cristopher Estimable, MD  PRESCRIPTION MEDICATION Inhale into the lungs at bedtime. CPAP    [provider]  sodium chloride (OCEAN) 0.65 % SOLN nasal spray Place 1  spray into both nostrils as needed for congestion. 10/28/20   Eulogio Bear, NP  traZODone (DESYREL) 50 MG tablet Take 100 mg by mouth at bedtime. 09/30/12   [provider]  vortioxetine HBr (TRINTELLIX) 10 MG TABS tablet Take 1 tablet (10 mg total) by mouth at bedtime. 12/11/19   [name], MD  XARELTO 20 MG TABS tablet TAKE 1 TABLET BY MOUTH DAILY WITH SUPPER 12/02/21   [name], MD      Allergies    Adhesive [tape] and Other    Review of Systems   Review of Systems  Gastrointestinal:  Positive for abdominal  pain and nausea.  All other systems reviewed and are negative.   Physical Exam Updated Vital Signs BP (!) 149/85 (BP Location: Right Arm)   Pulse (!) 59   Temp 97.8 F (36.6 C) (Oral)   Resp 14   Ht 5' 7"$  (1.702 m)   Wt 105 kg   SpO2 96%   BMI 36.26 kg/m  Physical Exam Vitals and nursing note reviewed.  Constitutional:      Comments: Uncomfortable and dehydrated  HENT:     Head: Normocephalic.     Mouth/Throat:     Pharynx: Oropharynx is clear.  Eyes:     Extraocular Movements: Extraocular movements intact.     Pupils: Pupils are equal, round, and reactive to light.  Cardiovascular:     Rate and Rhythm: Normal rate and regular rhythm.  Pulmonary:     Effort: Pulmonary effort is normal.     Breath sounds: Normal breath sounds.  Abdominal:     General: Abdomen is flat.     Comments: Mild diffuse tenderness worse in the right lower quadrant  Skin:    General: Skin is warm.     Capillary Refill: Capillary refill takes less than 2 seconds.  Neurological:     General: No focal deficit present.     Mental Status: He is oriented to person, place, and time.  Psychiatric:        Mood and Affect: Mood normal.        Behavior: Behavior normal.     ED Results / Procedures / Treatments   Labs (all labs ordered are listed, but only abnormal results are displayed) Labs Reviewed  COMPREHENSIVE METABOLIC PANEL - Abnormal; Notable for the following components:      Result Value   Glucose, Bld 104 (*)    All other components within normal limits  LIPASE, BLOOD - Abnormal; Notable for the following components:   Lipase 52 (*)    All other components within normal limits  CBC WITH DIFFERENTIAL/PLATELET  URINALYSIS, ROUTINE W REFLEX MICROSCOPIC    EKG None  Radiology No results found.  Procedures Procedures    Medications Ordered in ED Medications  famotidine (PEPCID) IVPB 20 mg premix (20 mg Intravenous New Bag/Given 09/18/22 1902)  iohexol (OMNIPAQUE) 300 MG/ML  solution 100 mL (100 mLs Intravenous Contrast Given 09/18/22 1839)  sodium chloride 0.9 % bolus 1,000 mL (1,000 mLs Intravenous New Bag/Given 09/18/22 1859)  ondansetron (ZOFRAN) injection 4 mg (4 mg Intravenous Given 09/18/22 1900)    ED Course/ Medical Decision Making/ A&P                             Medical Decision Making MARIEL SWAYZER is a 71 y.o. male here presenting with abdominal pain and nausea.  Consider ileus versus SBO versus constipation versus  colitis or diverticulitis.  Plan to get CBC and CMP and CT abdomen pelvis.  Will hydrate and reassess.  7:44 PM I reviewed patient's labs and independently interpreted imaging studies.  CBC and CMP unremarkable.  CT abdomen pelvis showed no acute process.  Clinically I wonder if he is developing a stomach ulcer or just persistent gastritis.  Patient received Pepcid and felt better.  Will discharge patient home with Pepcid and Carafate.  He is already on Protonix.  Patient would like a referral back to see Dr. Ardis Hughs  Problems Addressed: Bad breath: acute illness or injury Epigastric pain: acute illness or injury Gastritis, presence of bleeding unspecified, unspecified chronicity, unspecified gastritis type: acute illness or injury  Amount and/or Complexity of Data Reviewed Labs: ordered. Decision-making details documented in ED Course. Radiology: ordered and independent interpretation performed. Decision-making details documented in ED Course.  Risk Prescription drug management.    Final Clinical Impression(s) / ED Diagnoses Final diagnoses:  None    Rx / DC Orders ED Discharge Orders     None         Drenda Freeze, MD 09/18/22 1946

## 2022-09-18 NOTE — ED Notes (Signed)
Patient went to the bathroom waiting to to triage

## 2022-09-18 NOTE — ED Triage Notes (Signed)
Pt reports right abdominal pain states "I smell bowel in my breath" x 1 month

## 2022-09-20 NOTE — Progress Notes (Deleted)
GI Office Note    Referring Provider: [name], MD Primary Care Physician:  [name], MD Primary Gastroenterologist: Elon Alas. Abbey Chatters, DO  Date:  09/20/2022  ID:  [name], DOB 11-Jan-1951, MRN [id number]   Chief Complaint   No chief complaint on file.   History of Present Illness  [name] is a 72 y.o. male with a history of *** presenting today with complaint of   Last office visit 05/21/22. ***  EGD 06/18/22: -***  Colonoscopy 06/18/22: -***  ED visit 08/28/22 for epigastric pain, nausea, and report of 3 episodes of diarrhea. Also with complaint of breath smelling like feculent material.  CBC and CMP unremarkable. Suspected gastritis after negative CT obtained. Was given pepcid IV with improvement of symptoms. Discharged with carafate and pepcid along with his home protonix. Requested referral to see Dr. Ardis Hughs.   CT A/P 09/18/22: -hepatis steatosis -cholecystectomy -Unremarkable pancreas -evidence of prior nissen fundoplication -diverticulosis -enlarged prostate (evidence of TURP) -fat containing bilateral inguinal hernias     Current Outpatient Medications  Medication Sig Dispense Refill   acetaminophen (TYLENOL) 500 MG tablet Take 1,000 mg by mouth every 6 (six) hours as needed for moderate pain.     albuterol (VENTOLIN HFA) 108 (90 Base) MCG/ACT inhaler Inhale 2 puffs into the lungs every 4 (four) hours as needed for wheezing or shortness of breath. 18 g 2   atorvastatin (LIPITOR) 40 MG tablet Take 1 tablet (40 mg total) by mouth daily. (Patient taking differently: Take 40 mg by mouth at bedtime.) 90 tablet 3   Azelastine HCl (ASTEPRO) 0.15 % SOLN Place 1 spray into the nose every morning. (Patient taking differently: Place 1 spray into the nose daily as needed (allergies).)     budesonide-formoterol (SYMBICORT) 80-4.5 MCG/ACT inhaler Inhale 2 puffs into the lungs 2 (two) times daily. 1 each 12   Calcium Carbonate-Vitamin D (CALCIUM  + D PO) Take 1 tablet by mouth at bedtime.      fentaNYL (DURAGESIC) 12 MCG/HR Place 1 patch onto the skin every 3 (three) days.     fluticasone (FLONASE) 50 MCG/ACT nasal spray Place 1 spray into both nostrils at bedtime as needed for allergies.     hydroxypropyl methylcellulose / hypromellose (ISOPTO TEARS / GONIOVISC) 2.5 % ophthalmic solution Place 1 drop into both eyes 3 (three) times daily as needed for dry eyes.     latanoprost (XALATAN) 0.005 % ophthalmic solution Place 1 drop into both eyes at bedtime.     levocetirizine (XYZAL) 5 MG tablet TAKE 1 TABLET BY MOUTH EVERY EVENING 90 tablet 1   meclizine (ANTIVERT) 25 MG tablet Take 1 tablet (25 mg total) by mouth 3 (three) times daily as needed for dizziness. 30 tablet 0   metoprolol tartrate (LOPRESSOR) 25 MG tablet Take 25 mg by mouth as needed (when in afib).     Multiple Vitamin (MULTIVITAMIN WITH MINERALS) TABS tablet Take 1 tablet by mouth daily.     pantoprazole (PROTONIX) 40 MG tablet TAKE 1 TABLET(40 MG) BY MOUTH DAILY BEFORE BREAKFAST 30 tablet 11   PRESCRIPTION MEDICATION Inhale into the lungs at bedtime. CPAP     sodium chloride (OCEAN) 0.65 % SOLN nasal spray Place 1 spray into both nostrils as needed for congestion. 88 mL 0   sucralfate (CARAFATE) 1 g tablet Take 1 tablet (1 g total) by mouth 4 (four) times daily -  with meals and at bedtime. 60 tablet 0   traZODone (DESYREL)  50 MG tablet Take 100 mg by mouth at bedtime.     vortioxetine HBr (TRINTELLIX) 10 MG TABS tablet Take 1 tablet (10 mg total) by mouth at bedtime. 30 tablet 5   XARELTO 20 MG TABS tablet TAKE 1 TABLET BY MOUTH DAILY WITH SUPPER 90 tablet 1   No current facility-administered medications for this visit.    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   Cataracts, both eyes    worst on left   Colon polyps    Complication of anesthesia    pt had spinal for a knee replacement--"woke up" during surgery--and sick after surgery   Depression    Diverticulosis    DVT (deep venous thrombosis) ([place]) 2001 or 2002   right leg-required hospitalization x 8 days   DVT (deep venous thrombosis) ([place]) 1996   right   Glaucoma    both eyes   H/O hiatal hernia    Hearing impaired person, bilateral    bilateral   Hemorrhoid    History of IBS    History of kidney stones    x 1-passed   Hyperlipidemia 2012   Hypertension    Lumbago    Lumbar post-laminectomy syndrome    Myocardial infarction [facility]) December 2019   PAF (paroxysmal atrial fibrillation) (HCC)    PONV (postoperative nausea and vomiting)    Sleep apnea    pt uses cpap - setting of 16   Ulcer 1966   patient denies   Ulcerative colitis ([place])    patient denies   Wears glasses    Wears hearing aid     Past Surgical History:  Procedure Laterality Date   APPENDECTOMY  1993   BACK SURGERY  2008   bone spur removed    BALLOON DILATION N/A 06/18/2022   Procedure: BALLOON DILATION;  Surgeon: Eloise Harman, DO;  [facility];  Service: Endoscopy;  Laterality: N/A;   bilateral carpal tunnel release 2009     BIOPSY  06/18/2022   Procedure: BIOPSY;  Surgeon: Eloise Harman, DO;  [facility];  Service: Endoscopy;;   cervical fusion 2011--pt has good range of motion neck     CHOLECYSTECTOMY  1994   COLONOSCOPY WITH PROPOFOL N/A 06/18/2022   Procedure: COLONOSCOPY WITH PROPOFOL;  Surgeon: Eloise Harman, DO;  [facility];  Service: Endoscopy;  Laterality: N/A;  8:15 AM   ESOPHAGOGASTRODUODENOSCOPY (EGD) WITH PROPOFOL N/A 06/18/2022   Procedure: ESOPHAGOGASTRODUODENOSCOPY (EGD) WITH PROPOFOL;   Surgeon: Eloise Harman, DO;  [facility];  Service: Endoscopy;  Laterality: N/A;   EYE SURGERY N/A    [employer] 07/13/2020   HERNIA REPAIR  2008   INSERTION OF MESH Bilateral 07/31/2016   Procedure: INSERTION OF MESH;  Surgeon: [name], MD;  [facility]: [facility];  Service: General;  Laterality: Bilateral;   JOINT REPLACEMENT Bilateral    2001 left total knee and 1985 right total knee   LUMBAR LAMINECTOMY/DECOMPRESSION MICRODISCECTOMY N/A 07/24/2021   Procedure: DECOMPRESSION AND REMOVAL OF CYST LUMBAR FIVE THROUGH SACRAL  ONE;  Surgeon: Melina Schools, MD;  Location: [place];  Service: Orthopedics;  Laterality: N/A;   MASS EXCISION Left 01/04/2014   Procedure: LEFT MIDDLE FINGER MASS EXCISION WITH NAILBED REPAIR AND ADVANCEMENT FLAP;  Surgeon: [name], MD;  Location: [place];  Service: Orthopedics;  Laterality: Left;   mass removed from 2nd finger Left 03/2013   squamous carcinoma   nissen fundoplication [id number]     POLYPECTOMY  06/18/2022   Procedure: POLYPECTOMY;  Surgeon: Eloise Harman, DO;  [facility];  Service: Endoscopy;;   PROSTATE SURGERY  2014   TURP   right wrist fusion 12/12 - pt wearing brace on the wrist-not started physical therapy yet Right    fusion prevent limited ROM to right wrist   SPINE SURGERY  2009   cervical spine fusion   thyroid nodule     removed   TOTAL KNEE REVISION Right 11/17/2013   Procedure: RIGHT KNEE POLYETHYLENE REVISION, bone graft;  Surgeon: [name], MD;  [facility];  Service: Orthopedics;  Laterality: Right;   TOTAL SHOULDER ARTHROPLASTY Right 01/28/2018   Procedure: RIGHT TOTAL SHOULDER ARTHROPLASTY;  Surgeon: [name], MD;  Location: [place];  Service: Orthopedics;  Laterality: Right;   TOTAL SHOULDER ARTHROPLASTY Left 07/15/2018   Procedure: LEFT SHOULDER ANATOMIC TOTAL SHOULDER ARTHROPLASTY;  Surgeon: [name], MD;  Location: [place] [facility];  Service: Orthopedics;  Laterality:  Left;   TRANSURETHRAL RESECTION OF PROSTATE  10/09/2011   Procedure: TRANSURETHRAL RESECTION OF THE PROSTATE WITH GYRUS INSTRUMENTS;  Surgeon: Fredricka Bonine, MD;  [facility];  Service: Urology;  Laterality: N/A;   uvvvp     XI ROBOTIC ASSISTED INGUINAL HERNIA REPAIR WITH MESH Bilateral 07/31/2016   Procedure: XI ROBOTIC BILATERAL INGUINAL HERNIA REPAIR WITH MESH;  Surgeon: [name], MD;  [facility];  Service: General;  Laterality: Bilateral;    Family History  Problem Relation Age of Onset   Arthritis Mother    Hearing loss Mother    Hyperlipidemia Mother    Hypertension Mother    Deep vein thrombosis Mother    Bone cancer Father        deceased   Cancer Father    Diabetes Father    Prostate cancer Father    Ulcerative colitis Father    Diabetes Sister    Hearing loss Maternal Grandmother    Hearing loss Maternal Grandfather    Diabetes Paternal Grandfather     Allergies as of 09/21/2022 - Review Complete 09/18/2022  Allergen Reaction Noted   Adhesive [tape] Other (See Comments) 03/23/2017   Other Other (See Comments) 10/06/2011    Social History   Socioeconomic History   Marital status: Married    Spouse name: Opal Sidles   Number of children: 1   Years of education: Not on file   Highest education level: Not on file  Occupational History   Occupation: disabled  Tobacco Use   Smoking status: Former    Packs/day: 1.00    Years: 15.00    Total pack years: 15.00    Types: Cigarettes    Quit date: 08/04/1983    Years since quitting: 39.1   Smokeless tobacco: Never   Tobacco comments:    quit smoking 1985  Vaping Use   Vaping Use: Never used  Substance and Sexual Activity   Alcohol use: Yes    Alcohol/week: 3.0 standard drinks of alcohol    Types: 3 Cans of beer per week  Comment: 4 beers a week   Drug use: No   Sexual activity: Not Currently  Other Topics Concern   Not on file  Social History Narrative   Married x 3 years in 2021.    1 daughter, 1 grand son and 1 grand daughter   Social Determinants of Health   Financial Resource Strain: Low Risk  (08/04/2022)   Overall Financial Resource Strain (CARDIA)    Difficulty of Paying Living Expenses: Not hard at all  Food Insecurity: No Food Insecurity (08/04/2022)   Hunger Vital Sign    Worried About Running Out of Food in the Last Year: Never true    Ran Out of Food in the Last Year: Never true  Transportation Needs: No Transportation Needs (08/04/2022)   PRAPARE - Hydrologist (Medical): No    Lack of Transportation (Non-Medical): No  Physical Activity: Sufficiently Active (08/04/2022)   Exercise Vital Sign    Days of Exercise per Week: 7 days    Minutes of Exercise per Session: 30 min  Stress: No Stress Concern Present (08/04/2022)   [place]    Feeling of Stress : Not at all  Social Connections: Moderately Integrated (08/04/2022)   Social Connection and Isolation Panel [NHANES]    Frequency of Communication with Friends and Family: Twice a week    Frequency of Social Gatherings with Friends and Family: Once a week    Attends Religious Services: Never    Marine scientist or Organizations: No    Attends Music therapist: 1 to 4 times per year    Marital Status: Married     Review of Systems   Gen: Denies fever, chills, anorexia. Denies fatigue, weakness, weight loss.  CV: Denies chest pain, palpitations, syncope, peripheral edema, and claudication. Resp: Denies dyspnea at rest, cough, wheezing, coughing up blood, and pleurisy. GI: See HPI Derm: Denies rash, itching, dry skin Psych: Denies depression, anxiety, memory loss, confusion. No homicidal or suicidal ideation.  Heme: Denies bruising, bleeding, and enlarged lymph nodes.   Physical Exam   There were no vitals taken for this visit.  General:   Alert and oriented. No distress noted. Pleasant and  cooperative.  Head:  Normocephalic and atraumatic. Eyes:  Conjuctiva clear without scleral icterus. Mouth:  Oral mucosa pink and moist. Good dentition. No lesions. Lungs:  Clear to auscultation bilaterally. No wheezes, rales, or rhonchi. No distress.  Heart:  S1, S2 present without murmurs appreciated.  Abdomen:  +BS, soft, non-tender and non-distended. No rebound or guarding. No HSM or masses noted. Rectal: *** Msk:  Symmetrical without gross deformities. Normal posture. Extremities:  Without edema. Neurologic:  Alert and  oriented x4 Psych:  Alert and cooperative. Normal mood and affect.   Assessment  [name] is a 72 y.o. male with a history of *** presenting today with    PLAN   ***     Venetia Night, MSN, FNP-BC, AGACNP-BC [facility]

## 2022-09-21 ENCOUNTER — Ambulatory Visit: Payer: Medicare HMO | Admitting: Gastroenterology

## 2022-09-22 ENCOUNTER — Telehealth: Payer: Self-pay

## 2022-09-22 NOTE — Telephone Encounter (Signed)
        Patient  visited [facility] on 2/16   Telephone encounter attempt :  1st  A HIPAA compliant voice message was left requesting a return call.  Instructed patient to call back.    [place] [phone] [address], [place], [place] [address] Phone: [phone] Email: [email or web address]

## 2022-09-23 ENCOUNTER — Telehealth: Payer: Self-pay

## 2022-09-23 NOTE — Telephone Encounter (Signed)
        Patient  visited 2/16 on [place] encounter attempt :   2nd A HIPAA compliant voice message was left requesting a return call.  Instructed patient to call back .    [facility] [phone] [address], [place], [place] [address] Phone: [phone] Email: [email or web address]

## 2022-09-24 DIAGNOSIS — M961 Postlaminectomy syndrome, not elsewhere classified: Secondary | ICD-10-CM | POA: Diagnosis not present

## 2022-09-24 DIAGNOSIS — Z79891 Long term (current) use of opiate analgesic: Secondary | ICD-10-CM | POA: Diagnosis not present

## 2022-09-24 DIAGNOSIS — G4700 Insomnia, unspecified: Secondary | ICD-10-CM | POA: Diagnosis not present

## 2022-09-25 ENCOUNTER — Ambulatory Visit (INDEPENDENT_AMBULATORY_CARE_PROVIDER_SITE_OTHER): Payer: Medicare HMO | Admitting: Internal Medicine

## 2022-09-25 ENCOUNTER — Encounter: Payer: Self-pay | Admitting: Internal Medicine

## 2022-09-25 VITALS — BP 131/77 | HR 66 | Ht 66.75 in | Wt 228.6 lb

## 2022-09-25 DIAGNOSIS — R140 Abdominal distension (gaseous): Secondary | ICD-10-CM

## 2022-09-25 DIAGNOSIS — R590 Localized enlarged lymph nodes: Secondary | ICD-10-CM

## 2022-09-25 DIAGNOSIS — R195 Other fecal abnormalities: Secondary | ICD-10-CM | POA: Diagnosis not present

## 2022-09-25 DIAGNOSIS — H65192 Other acute nonsuppurative otitis media, left ear: Secondary | ICD-10-CM | POA: Diagnosis not present

## 2022-09-25 DIAGNOSIS — R1013 Epigastric pain: Secondary | ICD-10-CM

## 2022-09-25 DIAGNOSIS — R1310 Dysphagia, unspecified: Secondary | ICD-10-CM | POA: Diagnosis not present

## 2022-09-25 MED ORDER — ESOMEPRAZOLE MAGNESIUM 20 MG PO CPDR: 20.0000 mg | DELAYED_RELEASE_CAPSULE | Freq: Every day | ORAL | 2 refills | Status: DC

## 2022-09-25 MED ORDER — AMOXICILLIN-POT CLAVULANATE 875-125 MG PO TABS: 1.0000 | ORAL_TABLET | Freq: Two times a day (BID) | ORAL | 0 refills | Status: AC

## 2022-09-25 NOTE — Progress Notes (Unsigned)
Acute Office Visit  Subjective:     Patient ID: [name], male    DOB: [birth year], 72 y.o.   MRN: [id number]  Chief Complaint  Patient presents with   GI Problem    Went to [facility] 09/18/22 and was told they thought it was ulcers, gave meds and meds didn't work.   Ear Pain    Left ear inf possibly   [name] presents today for an acute visit for persistent symptoms of dyspepsia, epigastric pain, bloating, and loose stools.  He was last evaluated by me on 07/20/22 at which time Protonix increased to 40 mg twice daily.  He had previously undergone EGD with dilation that demonstrated gastritis.  Biopsies were negative for H. pylori and malignancy.  He returns to care today because his symptoms have persisted.  He would like to establish care with a new gastroenterologist.  He additionally endorses a recent history of left ear pain.  He would like me to examine his today.  He largely denies associated symptoms such as fever/chills, hearing loss, and sinus pain/pressure.  Review of Systems  Constitutional:  Negative for chills, fever and malaise/fatigue.  HENT:  Positive for ear pain (Left ear).   Gastrointestinal:  Positive for abdominal pain, diarrhea, heartburn and nausea.  All other systems reviewed and are negative.     Objective:    BP 131/77   Pulse 66   Ht 5' 6.75" (1.695 m)   Wt 228 lb 9.6 oz (103.7 kg)   SpO2 93%   BMI 36.07 kg/m   Physical Exam Vitals reviewed.  Constitutional:      General: He is not in acute distress.    Appearance: Normal appearance. He is obese. He is not ill-appearing.  HENT:     Head: Normocephalic and atraumatic.     Right Ear: Tympanic membrane, ear canal and external ear normal.     Left Ear: External ear normal.     Ears:     Comments: Erythematous, bulging TM noted in the left ear    Nose: Nose normal. No congestion or rhinorrhea.     Mouth/Throat:     Mouth: Mucous membranes are moist.     Pharynx: Oropharynx is clear.  Eyes:      General: No scleral icterus.    Extraocular Movements: Extraocular movements intact.     Conjunctiva/sclera: Conjunctivae normal.     Pupils: Pupils are equal, round, and reactive to light.  Neck:     Comments: There is a firm, mobile, rubbery supraclavicular lymph node palpable on exam today Cardiovascular:     Rate and Rhythm: Normal rate and regular rhythm.     Pulses: Normal pulses.     Heart sounds: Normal heart sounds. No murmur heard. Pulmonary:     Effort: Pulmonary effort is normal.     Breath sounds: Normal breath sounds. No wheezing, rhonchi or rales.  Abdominal:     General: Abdomen is flat. Bowel sounds are normal. There is no distension.     Palpations: Abdomen is soft.     Tenderness: There is no abdominal tenderness.  Musculoskeletal:        General: No swelling or deformity. Normal range of motion.     Cervical back: Normal range of motion.  Skin:    General: Skin is warm and dry.     Capillary Refill: Capillary refill takes less than 2 seconds.  Neurological:     General: No focal deficit present.  Mental Status: He is alert and oriented to person, place, and time.     Motor: No weakness.  Psychiatric:        Mood and Affect: Mood normal.        Behavior: Behavior normal.        Thought Content: Thought content normal.       Assessment & Plan:   Problem List Items Addressed This Visit       Acute otitis media    He endorses left ear discomfort over the last several days.  Erythematous and bulging TM noted on exam. -Augmentin x 5 days prescribed for treatment of acute otitis media of the left ear      Supraclavicular lymphadenopathy    Firm, mobile, rubbery, lymph node palpable in the left supraclavicular chain. -[place] ordered today      Dyspepsia    Presenting today for an acute visit due to persistent symptoms of dyspepsia.  Symptoms did not improve despite undergoing EGD with dilation or with increasing Protonix to 40 mg twice daily. -Discontinue  Protonix.  Start Nexium 20 mg daily -Gastroenterology referral placed today       Meds ordered this encounter  Medications   esomeprazole (NEXIUM) 20 MG capsule    Sig: Take 1 capsule (20 mg total) by mouth daily at 12 noon.    Dispense:  30 capsule    Refill:  2   amoxicillin-clavulanate (AUGMENTIN) 875-125 MG tablet    Sig: Take 1 tablet by mouth 2 (two) times daily for 5 days.    Dispense:  10 tablet    Refill:  0    Return if symptoms worsen or fail to improve.  [name], MD

## 2022-09-25 NOTE — Patient Instructions (Signed)
It was a pleasure to see you today.  Thank you for giving [place] the opportunity to be involved in your care.  Below is a brief recap of your visit and next steps.  We will plan to see you again in June  Summary Stop protonix Start Nexium I have prescribed Augmentin x 5 days for left ear infection

## 2022-09-30 ENCOUNTER — Encounter: Payer: Self-pay | Admitting: Internal Medicine

## 2022-10-01 ENCOUNTER — Ambulatory Visit: Payer: Medicare HMO | Admitting: Internal Medicine

## 2022-10-01 ENCOUNTER — Other Ambulatory Visit: Payer: Self-pay | Admitting: Cardiology

## 2022-10-01 ENCOUNTER — Encounter: Payer: Self-pay | Admitting: Internal Medicine

## 2022-10-01 VITALS — BP 132/78 | HR 60 | Temp 98.0°F | Ht 66.0 in | Wt 231.8 lb

## 2022-10-01 DIAGNOSIS — H6690 Otitis media, unspecified, unspecified ear: Secondary | ICD-10-CM | POA: Insufficient documentation

## 2022-10-01 DIAGNOSIS — K219 Gastro-esophageal reflux disease without esophagitis: Secondary | ICD-10-CM | POA: Diagnosis not present

## 2022-10-01 DIAGNOSIS — R197 Diarrhea, unspecified: Secondary | ICD-10-CM | POA: Diagnosis not present

## 2022-10-01 DIAGNOSIS — R590 Localized enlarged lymph nodes: Secondary | ICD-10-CM | POA: Insufficient documentation

## 2022-10-01 DIAGNOSIS — K580 Irritable bowel syndrome with diarrhea: Secondary | ICD-10-CM

## 2022-10-01 MED ORDER — DICYCLOMINE HCL 10 MG PO CAPS: 10.0000 mg | ORAL_CAPSULE | Freq: Three times a day (TID) | ORAL | 11 refills | Status: DC

## 2022-10-01 NOTE — Assessment & Plan Note (Signed)
Presenting today for an acute visit due to persistent symptoms of dyspepsia.  Symptoms did not improve despite undergoing EGD with dilation or with increasing Protonix to 40 mg twice daily. -Discontinue Protonix.  Start Nexium 20 mg daily -Gastroenterology referral placed today

## 2022-10-01 NOTE — Patient Instructions (Signed)
I am going to start you on a new medication called dicyclomine.  I want to take this twice daily for few weeks and see how you do.  If working then would continue.  If get constipated then I would decrease down to once a day or once every few days.  If helping some but not quite where you want to be then can increase up to 4 times a day.  Hopefully this helps both with your abdominal pain and diarrhea.  Continue on Nexium.  You can take the Carafate on top of this as needed for breakthrough upper GI symptoms.  Follow-up in 4 to 6 weeks.  It was very nice seeing both you today.  Dr. Abbey Chatters

## 2022-10-01 NOTE — Telephone Encounter (Signed)
Prescription refill request for Xarelto received.   Indication: afib  Last office visit: Harrell Gave 09/03/2022 Weight: 103.7 kg  Age: 72 yo  Scr: 1.17, 09/28/2022 CrCl: 82 ml/min   Refill sent.

## 2022-10-01 NOTE — Assessment & Plan Note (Signed)
Firm, mobile, rubbery, lymph node palpable in the left supraclavicular chain. -[place] ordered today

## 2022-10-01 NOTE — Assessment & Plan Note (Signed)
He endorses left ear discomfort over the last several days.  Erythematous and bulging TM noted on exam. -Augmentin x 5 days prescribed for treatment of acute otitis media of the left ear

## 2022-10-01 NOTE — Progress Notes (Signed)
Referring Provider: [name], MD Primary Care Physician:  [name], MD Primary GI:  Dr. Abbey Chatters  Chief Complaint  Patient presents with   Diarrhea    Pt has diarrhea. Center of abdomen hurting. Pt went to Urgent Care and they think its a ulcer    HPI:   [name] is a 72 y.o. male who presents to the clinic today for follow-up visit.  History of chronic GERD status post Niesen fundoplication.  Nexium once daily.  Carafate recently started by ER provider.  ER visit 09/18/2022 for breakthrough abdominal pain and chronic diarrhea.  CT abdomen pelvis with contrast relatively unremarkable from a GI standpoint.  EGD 06/18/2022 with prior Niesen fundoplication status post esophageal dilation.  Mild gastritis, biopsies negative for H. pylori.  Colonoscopy 06/18/2022 with healthy mucosa throughout the entire colon.  3 tubular adenomas removed.  Recall 5 years.  Today, main complaints are ongoing diarrhea.  States he will have up to 6 loose stools daily.  Also with periumbilical abdominal pain mild to moderate severity, intermittent in nature.  Some inguinal pain due to bilateral hernias.  Status postcholecystectomy years ago.  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   Cataracts, both eyes    worst on left   Colon polyps    Complication of anesthesia    pt had spinal for a knee replacement--"woke up" during surgery--and sick after surgery   Depression    Diverticulosis    DVT (deep venous thrombosis) ([place]) 2001 or 2002   right leg-required hospitalization x 8 days   DVT (deep venous thrombosis) ([place]) 1996    right   Glaucoma    both eyes   H/O hiatal hernia    Hearing impaired person, bilateral    bilateral   Hemorrhoid    History of IBS    History of kidney stones    x 1-passed   Hyperlipidemia 2012   Hypertension    Lumbago    Lumbar post-laminectomy syndrome    Myocardial infarction [facility]) December 2019   PAF (paroxysmal atrial fibrillation) (H[facility])    PONV (postoperative nausea and vomiting)    Sleep apnea    pt uses cpap - setting of 16   Ulcer 1966   patient denies   Ulcerative colitis ([place])    patient denies   Wears glasses    Wears hearing aid     Past Surgical History:  Procedure Laterality Date   APPENDECTOMY  1993   BACK SURGERY  2008   bone spur removed    BALLOON DILATION N/A 06/18/2022   Procedure: BALLOON DILATION;  Surgeon: Eloise Harman, DO;  [facility];  Service: Endoscopy;  Laterality: N/A;   bilateral carpal tunnel release 2009     BIOPSY  06/18/2022   Procedure: BIOPSY;  Surgeon: Eloise Harman, DO;  [facility];  Service: Endoscopy;;   cervical fusion 2011--pt has good range of motion neck     CHOLECYSTECTOMY  1994   COLONOSCOPY WITH PROPOFOL N/A 06/18/2022  Procedure: COLONOSCOPY WITH PROPOFOL;  Surgeon: Eloise Harman, DO;  [facility];  Service: Endoscopy;  Laterality: N/A;  8:15 AM   ESOPHAGOGASTRODUODENOSCOPY (EGD) WITH PROPOFOL N/A 06/18/2022   Procedure: ESOPHAGOGASTRODUODENOSCOPY (EGD) WITH PROPOFOL;  Surgeon: Eloise Harman, DO;  [facility];  Service: Endoscopy;  Laterality: N/A;   EYE SURGERY N/A    [employer] 07/13/2020   HERNIA REPAIR  2008   INSERTION OF MESH Bilateral 07/31/2016   Procedure: INSERTION OF MESH;  Surgeon: [name], MD;  Location: [facility];  Service: General;  Laterality: Bilateral;   JOINT REPLACEMENT Bilateral    2001 left total knee and 1985 right total knee   LUMBAR LAMINECTOMY/DECOMPRESSION MICRODISCECTOMY N/A 07/24/2021   Procedure: DECOMPRESSION AND  REMOVAL OF CYST LUMBAR FIVE THROUGH SACRAL ONE;  Surgeon: Melina Schools, MD;  Location: [place];  Service: Orthopedics;  Laterality: N/A;   MASS EXCISION Left 01/04/2014   Procedure: LEFT MIDDLE FINGER MASS EXCISION WITH NAILBED REPAIR AND ADVANCEMENT FLAP;  Surgeon: [name], MD;  Location: [place];  Service: Orthopedics;  Laterality: Left;   mass removed from 2nd finger Left 03/2013   squamous carcinoma   nissen fundoplication [id number]     POLYPECTOMY  06/18/2022   Procedure: POLYPECTOMY;  Surgeon: Eloise Harman, DO;  [facility];  Service: Endoscopy;;   PROSTATE SURGERY  2014   TURP   right wrist fusion 12/12 - pt wearing brace on the wrist-not started physical therapy yet Right    fusion prevent limited ROM to right wrist   SPINE SURGERY  2009   cervical spine fusion   thyroid nodule     removed   TOTAL KNEE REVISION Right 11/17/2013   Procedure: RIGHT KNEE POLYETHYLENE REVISION, bone graft;  Surgeon: [name], MD;  [facility];  Service: Orthopedics;  Laterality: Right;   TOTAL SHOULDER ARTHROPLASTY Right 01/28/2018   Procedure: RIGHT TOTAL SHOULDER ARTHROPLASTY;  Surgeon: [name], MD;  Location: [place];  Service: Orthopedics;  Laterality: Right;   TOTAL SHOULDER ARTHROPLASTY Left 07/15/2018   Procedure: LEFT SHOULDER ANATOMIC TOTAL SHOULDER ARTHROPLASTY;  Surgeon: [name], MD;  Location: [place];  Service: Orthopedics;  Laterality: Left;   TRANSURETHRAL RESECTION OF PROSTATE  10/09/2011   Procedure: TRANSURETHRAL RESECTION OF THE PROSTATE WITH GYRUS INSTRUMENTS;  Surgeon: Fredricka Bonine, MD;  [facility];  Service: Urology;  Laterality: N/A;   uvvvp     XI ROBOTIC ASSISTED INGUINAL HERNIA REPAIR WITH MESH Bilateral 07/31/2016   Procedure: XI ROBOTIC BILATERAL INGUINAL HERNIA REPAIR WITH MESH;  Surgeon: [name], MD;  [facility];  Service: General;  Laterality: Bilateral;    Current Outpatient  Medications  Medication Sig Dispense Refill   acetaminophen (TYLENOL) 500 MG tablet Take 1,000 mg by mouth every 6 (six) hours as needed for moderate pain.     albuterol (VENTOLIN HFA) 108 (90 Base) MCG/ACT inhaler Inhale 2 puffs into the lungs every 4 (four) hours as needed for wheezing or shortness of breath. 18 g 2   atorvastatin (LIPITOR) 40 MG tablet Take 1 tablet (40 mg total) by mouth daily. (Patient taking differently: Take 40 mg by mouth at bedtime.) 90 tablet 3   Azelastine HCl (ASTEPRO) 0.15 % SOLN Place 1 spray into the nose every morning. (Patient taking differently: Place 1 spray into the nose daily as needed (allergies).)     Calcium Carbonate-Vitamin D (CALCIUM + D PO) Take 1 tablet by mouth  at bedtime.      esomeprazole (NEXIUM) 20 MG capsule Take 1 capsule (20 mg total) by mouth daily at 12 noon. 30 capsule 2   fentaNYL (DURAGESIC) 12 MCG/HR Place 1 patch onto the skin every 3 (three) days.     fluticasone (FLONASE) 50 MCG/ACT nasal spray Place 1 spray into both nostrils at bedtime as needed for allergies.     hydroxypropyl methylcellulose / hypromellose (ISOPTO TEARS / GONIOVISC) 2.5 % ophthalmic solution Place 1 drop into both eyes 3 (three) times daily as needed for dry eyes.     latanoprost (XALATAN) 0.005 % ophthalmic solution Place 1 drop into both eyes at bedtime.     levocetirizine (XYZAL) 5 MG tablet TAKE 1 TABLET BY MOUTH EVERY EVENING 90 tablet 1   meclizine (ANTIVERT) 25 MG tablet Take 1 tablet (25 mg total) by mouth 3 (three) times daily as needed for dizziness. 30 tablet 0   metoprolol tartrate (LOPRESSOR) 25 MG tablet Take 25 mg by mouth as needed (when in afib).     Multiple Vitamin (MULTIVITAMIN WITH MINERALS) TABS tablet Take 1 tablet by mouth daily.     PRESCRIPTION MEDICATION Inhale into the lungs at bedtime. CPAP     sucralfate (CARAFATE) 1 g tablet Take 1 tablet (1 g total) by mouth 4 (four) times daily -  with meals and at bedtime. 60 tablet 0   traZODone  (DESYREL) 50 MG tablet Take 100 mg by mouth at bedtime.     vortioxetine HBr (TRINTELLIX) 10 MG TABS tablet Take 1 tablet (10 mg total) by mouth at bedtime. 30 tablet 5   XARELTO 20 MG TABS tablet TAKE 1 TABLET BY MOUTH DAILY WITH SUPPER 90 tablet 1   budesonide-formoterol (SYMBICORT) 80-4.5 MCG/ACT inhaler Inhale 2 puffs into the lungs 2 (two) times daily. (Patient not taking: Reported on 10/01/2022) 1 each 12   sodium chloride (OCEAN) 0.65 % SOLN nasal spray Place 1 spray into both nostrils as needed for congestion. (Patient not taking: Reported on 10/01/2022) 88 mL 0   No current facility-administered medications for this visit.    Allergies as of 10/01/2022 - Review Complete 10/01/2022  Allergen Reaction Noted   Adhesive [tape] Other (See Comments) 03/23/2017   Other Other (See Comments) 10/06/2011    Family History  Problem Relation Age of Onset   Arthritis Mother    Hearing loss Mother    Hyperlipidemia Mother    Hypertension Mother    Deep vein thrombosis Mother    Bone cancer Father        deceased   Cancer Father    Diabetes Father    Prostate cancer Father    Ulcerative colitis Father    Diabetes Sister    Hearing loss Maternal Grandmother    Hearing loss Maternal Grandfather    Diabetes Paternal Grandfather     Social History   Socioeconomic History   Marital status: Married    Spouse name: Opal Sidles   Number of children: 1   Years of education: Not on file   Highest education level: Not on file  Occupational History   Occupation: disabled  Tobacco Use   Smoking status: Former    Packs/day: 1.00    Years: 15.00    Total pack years: 15.00    Types: Cigarettes    Quit date: 08/04/1983    Years since quitting: 39.1   Smokeless tobacco: Never   Tobacco comments:    quit smoking 1985  Vaping Use  Vaping Use: Never used  Substance and Sexual Activity   Alcohol use: Yes    Alcohol/week: 3.0 standard drinks of alcohol    Types: 3 Cans of beer per week     Comment: 4 beers a week   Drug use: No   Sexual activity: Not Currently  Other Topics Concern   Not on file  Social History Narrative   Married x 3 years in 2021.   1 daughter, 1 grand son and 1 grand daughter   Social Determinants of Health   Financial Resource Strain: Low Risk  (08/04/2022)   Overall Financial Resource Strain (CARDIA)    Difficulty of Paying Living Expenses: Not hard at all  Food Insecurity: No Food Insecurity (08/04/2022)   Hunger Vital Sign    Worried About Running Out of Food in the Last Year: Never true    Ran Out of Food in the Last Year: Never true  Transportation Needs: No Transportation Needs (08/04/2022)   PRAPARE - Hydrologist (Medical): No    Lack of Transportation (Non-Medical): No  Physical Activity: Sufficiently Active (08/04/2022)   Exercise Vital Sign    Days of Exercise per Week: 7 days    Minutes of Exercise per Session: 30 min  Stress: No Stress Concern Present (08/04/2022)   [place]    Feeling of Stress : Not at all  Social Connections: Moderately Integrated (08/04/2022)   Social Connection and Isolation Panel [NHANES]    Frequency of Communication with Friends and Family: Twice a week    Frequency of Social Gatherings with Friends and Family: Once a week    Attends Religious Services: Never    Marine scientist or Organizations: No    Attends Music therapist: 1 to 4 times per year    Marital Status: Married    Subjective: Review of Systems  Constitutional:  Negative for chills and fever.  HENT:  Negative for congestion and hearing loss.   Eyes:  Negative for blurred vision and double vision.  Respiratory:  Negative for cough and shortness of breath.   Cardiovascular:  Negative for chest pain and palpitations.  Gastrointestinal:  Positive for abdominal pain, diarrhea and heartburn. Negative for blood in stool, constipation,  melena and vomiting.  Genitourinary:  Negative for dysuria and urgency.  Musculoskeletal:  Negative for joint pain and myalgias.  Skin:  Negative for itching and rash.  Neurological:  Negative for dizziness and headaches.  Psychiatric/Behavioral:  Negative for depression. The patient is not nervous/anxious.      Objective: BP 132/78   Pulse 60   Temp 98 F (36.7 C)   Ht '5\' 6"'$  (1.676 m)   Wt 231 lb 12.8 oz (105.1 kg)   BMI 37.41 kg/m  Physical Exam Constitutional:      Appearance: Normal appearance.  HENT:     Head: Normocephalic and atraumatic.  Eyes:     Extraocular Movements: Extraocular movements intact.     Conjunctiva/sclera: Conjunctivae normal.  Cardiovascular:     Rate and Rhythm: Normal rate and regular rhythm.  Pulmonary:     Effort: Pulmonary effort is normal.     Breath sounds: Normal breath sounds.  Abdominal:     General: Bowel sounds are normal.     Palpations: Abdomen is soft.  Musculoskeletal:        General: Normal range of motion.     Cervical back: Normal range of motion  and neck supple.  Skin:    General: Skin is warm.  Neurological:     General: No focal deficit present.     Mental Status: He is alert and oriented to person, place, and time.  Psychiatric:        Mood and Affect: Mood normal.        Behavior: Behavior normal.      Assessment: *Chronic GERD-status post Niesen fundoplication, well-controlled on Nexium *Abdominal pain-periumbilical *Chronic diarrhea  Plan: Patient's upper GI symptoms appear well-controlled.  Denies any acid reflux for me today.  Dysphagia improved s/p dilation.  Continue on Nexium.  He can take Carafate on top of this as needed for breakthrough symptoms.  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  Will start on dicyclomine twice daily and see how he does.  Counseled he can increase or decrease dose as needed.  Consider trial of cholestyramine in the future if no  improvement on dicyclomine.  Consider 2-week course of Xifaxan.  Follow-up in 4 to 6 weeks  10/01/2022 11:10 AM   Disclaimer: This note was dictated with voice recognition software. Similar sounding words can inadvertently be transcribed and may not be corrected upon review.

## 2022-10-05 DIAGNOSIS — H401131 Primary open-angle glaucoma, bilateral, mild stage: Secondary | ICD-10-CM | POA: Diagnosis not present

## 2022-10-07 ENCOUNTER — Ambulatory Visit (HOSPITAL_COMMUNITY)
Admission: RE | Admit: 2022-10-07 | Discharge: 2022-10-07 | Disposition: A | Discharge status: Home / Self Care | Payer: Medicare HMO | Source: Ambulatory Visit | Attending: Internal Medicine | Admitting: Internal Medicine

## 2022-10-07 ENCOUNTER — Other Ambulatory Visit: Payer: Self-pay | Admitting: Internal Medicine

## 2022-10-07 ENCOUNTER — Encounter (HOSPITAL_BASED_OUTPATIENT_CLINIC_OR_DEPARTMENT_OTHER): Payer: Self-pay | Admitting: Cardiology

## 2022-10-07 DIAGNOSIS — R590 Localized enlarged lymph nodes: Secondary | ICD-10-CM | POA: Diagnosis not present

## 2022-10-07 DIAGNOSIS — E041 Nontoxic single thyroid nodule: Secondary | ICD-10-CM | POA: Diagnosis not present

## 2022-10-19 ENCOUNTER — Encounter: Payer: Self-pay | Admitting: Pulmonary Disease

## 2022-10-19 ENCOUNTER — Ambulatory Visit: Payer: Medicare HMO | Admitting: Pulmonary Disease

## 2022-10-19 VITALS — BP 132/82 | HR 74 | Ht 66.0 in | Wt 228.2 lb

## 2022-10-19 DIAGNOSIS — G4733 Obstructive sleep apnea (adult) (pediatric): Secondary | ICD-10-CM

## 2022-10-19 DIAGNOSIS — G8929 Other chronic pain: Secondary | ICD-10-CM | POA: Diagnosis not present

## 2022-10-19 DIAGNOSIS — J3089 Other allergic rhinitis: Secondary | ICD-10-CM | POA: Diagnosis not present

## 2022-10-19 DIAGNOSIS — H9202 Otalgia, left ear: Secondary | ICD-10-CM | POA: Diagnosis not present

## 2022-10-19 DIAGNOSIS — J4540 Moderate persistent asthma, uncomplicated: Secondary | ICD-10-CM | POA: Diagnosis not present

## 2022-10-19 NOTE — Patient Instructions (Signed)
Will arrange for referral to ENT  - follow up in 6 months

## 2022-10-19 NOTE — Progress Notes (Signed)
[facility]  Chief Complaint  Patient presents with   Follow-up    OSA, Asthma    Constitutional:  BP 132/82   Pulse 74   Ht 5\' 6"  (1.676 m)   Wt 228 lb 3.2 oz (103.5 kg)   SpO2 97% Comment: ra  BMI 36.83 kg/m   Past Medical History:  HTN, A fib, HLD, GERD, Ulcerative colitis, Back pain, Nephrolithiasis, IBS, HH, Glaucoma, DVT 2001 and 2002, Diverticulosis, Depression, Colon polyp, Cataracts, Squamous cell skin cancer, BPH, OA  Past Surgical History:  [name]  has a past surgical history that includes right wrist fusion 12/12 - pt wearing brace on the wrist-not started physical therapy yet (Right); cervical fusion 2011--pt has good range of motion neck; bilateral carpal tunnel release 2009; Back surgery ([id number]); nissen fundoplication [id number]; Cholecystectomy (1994); Appendectomy (1993); Transurethral resection of prostate (10/09/2011); Prostate surgery (2014); Spine surgery (2009); mass removed from 2nd finger (Left, 03/2013); uvvvp; Total knee revision (Right, 11/17/2013); Mass excision (Left, 01/04/2014); Joint replacement (Bilateral); XI Robotic assisted inguinal hernia repair with mesh (Bilateral, 07/31/2016); Insertion of mesh (Bilateral, 07/31/2016); Total shoulder arthroplasty (Right, 01/28/2018); thyroid nodule; Total shoulder arthroplasty (Left, 07/15/2018); Eye surgery (N/A); Lumbar laminectomy/decompression microdiscectomy (N/A, 07/24/2021); Colonoscopy with propofol (N/A, 06/18/2022); Esophagogastroduodenoscopy (egd) with propofol (N/A, 06/18/2022); Balloon dilation (N/A, 06/18/2022); biopsy (06/18/2022); polypectomy (06/18/2022); and Hernia repair (2008).  Brief Summary:  [name] is a 72 y.o. male former smoker with obstructive sleep apnea, allergic asthma, and perennial allergic rhinitis.      Subjective:   Uses CPAP nightly.  Had to get a new mask - previous mask discontinued.  His dog wakes him up in the morning.  Gets about 7  hrs sleep.  Feels rested.  Has occasional cough.  Uses albuterol intermittently.  Not having chest congestion, or wheeze.  Has trouble with abdominal pain.  Following GI and was told [name] has a hernia so [name] will be seeing surgery.  [name] has chronic pain in his left ear  saw ENT before, and would like to see ENT again.   Physical Exam:   Appearance - well kempt   ENMT - no sinus tenderness, no oral exudate, no LAN, Mallampati 3 airway, no stridor, deviated septum  Respiratory - equal breath sounds bilaterally, no wheezing or rales  CV - s1s2 regular rate and rhythm, no murmurs  Ext - no clubbing, no edema  Skin - no rashes  Psych - normal mood and affect     Pulmonary testing:  FeNO 09/14/18 >> 24 Spirometry 09/14/18 >> FEV1 2.4 (83%), FEV1% 83 IgE 03/09/22 >> 7 PFT 04/28/22 >> FEV1 2.78 (102%), FEV1% 85, TLC 7.50 (118%), DLCO 112%  Sleep Tests:  PSG 12/21/91 >> AHI 56 Auto CPAP 09/16/22 to 10/15/22 >> used on 30 of 30 nights with average 7 hrs 5 min.  Average AHI 2.8 with median CPAP 10 and 95 th percentile CPAP 12 cm H2O  Cardiac Tests:  Echo 07/19/18 >> EF 60 to 65%, grade 2 DD  Social History:  [name]  reports that [name] quit smoking about 39 years ago. His smoking use included cigarettes. [name] has a 15.00 pack-year smoking history. [name] has never used smokeless tobacco. [name] reports current alcohol use of about 3.0 standard drinks of alcohol per week. [name] reports that [name] does not use drugs.  Family History:  His family history includes Arthritis in his mother; Bone cancer in his father; Cancer in his father; Deep vein thrombosis  in his mother; Diabetes in his father, paternal grandfather, and sister; Hearing loss in his maternal grandfather, maternal grandmother, and mother; Hyperlipidemia in his mother; Hypertension in his mother; Prostate cancer in his father; Ulcerative colitis in his father.     Assessment/Plan:   Obstructive sleep apnea. - [name] is compliant with therapy and reports  benefit - [name] uses [employer] for his DME - current CPAP ordered August 2023 - continue auto CPAP 5 to 20 cm H2O  Chronic productive cough from allergic asthma and perennial allergic rhinitis with deviated nasal septum. - continue symbicort 80 two puffs bid - prn nasal irrigation, flonase, astepro, xyzal, benadryl   Allergic rhinitis with CPAP rhinitis. - flonase, nasal irrigation, astepro, xyzal - [name] can try breath rite strips at night when using CPAP  Chronic left ear pain. - will arrange for referral to ENT  Obesity. - discussed importance of weight loss  Time Spent Involved in Patient Care on Day of Examination:  25 minutes  Follow up:   Patient Instructions  Will arrange for referral to ENT  - follow up in 6 months  Medication List:   Allergies as of 10/19/2022       Reactions   Adhesive [tape] Other (See Comments)   Causes blisters - pls use paper tape   Other Other (See Comments)   "Adhesives"Pt allergic to some tapes--pt request that we only use paper tape!!        Medication List        Accurate as of October 19, 2022 10:47 AM. If you have any questions, ask your nurse or doctor.          acetaminophen 500 MG tablet Commonly known as: TYLENOL Take 1,000 mg by mouth every 6 (six) hours as needed for moderate pain.   albuterol 108 (90 Base) MCG/ACT inhaler Commonly known as: VENTOLIN HFA Inhale 2 puffs into the lungs every 4 (four) hours as needed for wheezing or shortness of breath.   atorvastatin 40 MG tablet Commonly known as: LIPITOR TAKE 1 TABLET(40 MG) BY MOUTH DAILY   Azelastine HCl 0.15 % Soln Commonly known as: Astepro Place 1 spray into the nose every morning. What changed:  when to take this reasons to take this   budesonide-formoterol 80-4.5 MCG/ACT inhaler Commonly known as: Symbicort Inhale 2 puffs into the lungs 2 (two) times daily.   CALCIUM + D PO Take 1 tablet by mouth at bedtime.   dicyclomine 10 MG capsule Commonly known  as: BENTYL Take 1 capsule (10 mg total) by mouth 4 (four) times daily -  before meals and at bedtime.   esomeprazole 20 MG capsule Commonly known as: NexIUM Take 1 capsule (20 mg total) by mouth daily at 12 noon.   fentaNYL 12 MCG/HR Commonly known as: [place] 1 patch onto the skin every 3 (three) days.   fluticasone 50 MCG/ACT nasal spray Commonly known as: FLONASE Place 1 spray into both nostrils at bedtime as needed for allergies.   hydroxypropyl methylcellulose / hypromellose 2.5 % ophthalmic solution Commonly known as: ISOPTO TEARS / GONIOVISC Place 1 drop into both eyes 3 (three) times daily as needed for dry eyes.   latanoprost 0.005 % ophthalmic solution Commonly known as: XALATAN Place 1 drop into both eyes at bedtime.   levocetirizine 5 MG tablet Commonly known as: XYZAL TAKE 1 TABLET BY MOUTH EVERY EVENING   meclizine 25 MG tablet Commonly known as: ANTIVERT Take 1 tablet (25 mg total) by mouth 3 (  three) times daily as needed for dizziness.   metoprolol tartrate 25 MG tablet Commonly known as: LOPRESSOR Take 25 mg by mouth as needed (when in afib).   multivitamin with minerals Tabs tablet Take 1 tablet by mouth daily.   PRESCRIPTION MEDICATION Inhale into the lungs at bedtime. CPAP   sodium chloride 0.65 % Soln nasal spray Commonly known as: OCEAN Place 1 spray into both nostrils as needed for congestion.   sucralfate 1 g tablet Commonly known as: Carafate Take 1 tablet (1 g total) by mouth 4 (four) times daily -  with meals and at bedtime.   traZODone 50 MG tablet Commonly known as: DESYREL Take 100 mg by mouth at bedtime.   vortioxetine HBr 10 MG Tabs tablet Commonly known as: Trintellix Take 1 tablet (10 mg total) by mouth at bedtime.   Xarelto 20 MG Tabs tablet Generic drug: rivaroxaban TAKE 1 TABLET BY MOUTH DAILY WITH SUPPER        Signature:  Chesley Mires, MD [place] Pager - [phone] 10/19/2022, 10:47 AM      Auto CPAP download received.  Auto CPAP 03/29/22 to 04/27/22 >> used on 30 of 30 nights with average 7 hrs 5 min.  Average AHI 6.1 with median CPAP 10 and 95 th percentile CPAP 12 cm H2O.   Chesley Mires, MD [place] Pager - [phone] 10/19/2022, 10:47 AM

## 2022-11-04 ENCOUNTER — Ambulatory Visit (INDEPENDENT_AMBULATORY_CARE_PROVIDER_SITE_OTHER): Payer: Medicare HMO | Admitting: Internal Medicine

## 2022-11-04 ENCOUNTER — Encounter: Payer: Self-pay | Admitting: Internal Medicine

## 2022-11-04 DIAGNOSIS — R140 Abdominal distension (gaseous): Secondary | ICD-10-CM | POA: Diagnosis not present

## 2022-11-04 DIAGNOSIS — R1310 Dysphagia, unspecified: Secondary | ICD-10-CM

## 2022-11-04 DIAGNOSIS — R195 Other fecal abnormalities: Secondary | ICD-10-CM | POA: Diagnosis not present

## 2022-11-04 DIAGNOSIS — R1013 Epigastric pain: Secondary | ICD-10-CM | POA: Diagnosis not present

## 2022-11-04 MED ORDER — ESOMEPRAZOLE MAGNESIUM 20 MG PO CPDR: 20.0000 mg | DELAYED_RELEASE_CAPSULE | Freq: Every day | ORAL | 11 refills | Status: DC

## 2022-11-04 NOTE — Progress Notes (Signed)
Referring Provider: [name], MD Primary Care Physician:  [name], MD Primary GI:  Dr. Abbey Chatters  Chief Complaint  Patient presents with   Follow-up    Patient here today for a follow up. Patient says he has some issues with his mid abdomin burning. He is has having some eating and having to have a bm right after. He says he is taking Bentyl bid. He is no longer taking Carafate. Patient needs records with imagaing sent to Dr. [name] with [facility] surgery. Needs refill on Esomeprazole refilled, which he takes once per day and controls gerd symptoms.    HPI:   [name] is a 72 y.o. male who presents to the clinic today for follow-up visit.  History of chronic GERD status post Niesen fundoplication.  Nexium once daily.  Carafate recently started by ER provider.  ER visit 09/18/2022 for breakthrough abdominal pain and chronic diarrhea.  CT abdomen pelvis with contrast relatively unremarkable from a GI standpoint. Does have bilateral fat containing inguinal hernias.   EGD 06/18/2022 with prior Niesen fundoplication status post esophageal dilation.  Mild gastritis, biopsies negative for H. pylori.  Colonoscopy 06/18/2022 with healthy mucosa throughout the entire colon.  3 tubular adenomas removed.  Recall 5 years.  Today, main complaints are ongoing diarrhea. Also with periumbilical abdominal pain mild to moderate severity, intermittent in nature.  Some inguinal pain due to bilateral hernias.  Status postcholecystectomy years ago.  Started on dicyclomine BID on previous visit. Notes some improvement but still having 3-4 loose Bms some days  Past Medical History:  Diagnosis Date   Allergy 2005   Arthritis    oa and ddd   Back pain, chronic    pt states he has 3 herniated disks--uses fentanyl patch for pain   Blood transfusion without reported diagnosis 1950   rH negative"at birth only"   BPH (benign prostatic hyperplasia)    frequent urination,  not able to hold urine well   Cancer    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   Cataracts, both eyes    worst on left   Colon polyps    Complication of anesthesia    pt had spinal for a knee replacement--"woke up" during surgery--and sick after surgery   Depression    Diverticulosis    DVT (deep venous thrombosis) 2001 or 2002   right leg-required hospitalization x 8 days   DVT (deep venous thrombosis) 1996   right   Glaucoma    both eyes   H/O hiatal hernia    Hearing impaired person, bilateral    bilateral   Hemorrhoid    History of IBS    History of kidney stones    x 1-passed   Hyperlipidemia 2012   Hypertension    Lumbago    Lumbar post-laminectomy syndrome    Myocardial infarction December 2019   PAF (paroxysmal atrial fibrillation)    PONV (postoperative nausea and vomiting)    Sleep apnea    pt uses cpap - setting of 16   Ulcer 1966   patient denies   Ulcerative colitis    patient denies   Wears glasses    Wears hearing aid     Past Surgical History:  Procedure Laterality Date   [place]  2008   bone spur removed    BALLOON DILATION N/A 06/18/2022   Procedure: BALLOON DILATION;  Surgeon: Eloise Harman, DO;  [facility];  Service: Endoscopy;  Laterality: N/A;   bilateral carpal tunnel release 2009     BIOPSY  06/18/2022   Procedure: BIOPSY;  Surgeon: Eloise Harman, DO;  [facility];  Service: Endoscopy;;   cervical fusion 2011--pt has good range of motion neck     CHOLECYSTECTOMY  1994   COLONOSCOPY WITH PROPOFOL N/A 06/18/2022   Procedure: COLONOSCOPY WITH PROPOFOL;  Surgeon: Eloise Harman, DO;  [facility];  Service: Endoscopy;  Laterality: N/A;  8:15 AM   ESOPHAGOGASTRODUODENOSCOPY (EGD) WITH PROPOFOL N/A 06/18/2022   Procedure: ESOPHAGOGASTRODUODENOSCOPY (EGD) WITH PROPOFOL;  Surgeon: Eloise Harman, DO;  [facility];  Service: Endoscopy;  Laterality: N/A;   EYE  SURGERY N/A    [employer] 07/13/2020   HERNIA REPAIR  2008   INSERTION OF MESH Bilateral 07/31/2016   Procedure: INSERTION OF MESH;  Surgeon: [name], MD;  Location: [facility];  Service: General;  Laterality: Bilateral;   JOINT REPLACEMENT Bilateral    2001 left total knee and 1985 right total knee   LUMBAR LAMINECTOMY/DECOMPRESSION MICRODISCECTOMY N/A 07/24/2021   Procedure: DECOMPRESSION AND REMOVAL OF CYST LUMBAR FIVE THROUGH SACRAL ONE;  Surgeon: Melina Schools, MD;  Location: [facility];  Service: Orthopedics;  Laterality: N/A;   MASS EXCISION Left 01/04/2014   Procedure: LEFT MIDDLE FINGER MASS EXCISION WITH NAILBED REPAIR AND ADVANCEMENT FLAP;  Surgeon: [name], MD;  Location: [facility];  Service: Orthopedics;  Laterality: Left;   mass removed from 2nd finger Left 03/2013   squamous carcinoma   nissen fundoplication [id number]     POLYPECTOMY  06/18/2022   Procedure: POLYPECTOMY;  Surgeon: Eloise Harman, DO;  [facility];  Service: Endoscopy;;   PROSTATE SURGERY  2014   TURP   right wrist fusion 12/12 - pt wearing brace on the wrist-not started physical therapy yet Right    fusion prevent limited ROM to right wrist   SPINE SURGERY  2009   cervical spine fusion   thyroid nodule     removed   TOTAL KNEE REVISION Right 11/17/2013   Procedure: RIGHT KNEE POLYETHYLENE REVISION, bone graft;  Surgeon: [name], MD;  [facility];  Service: Orthopedics;  Laterality: Right;   TOTAL SHOULDER ARTHROPLASTY Right 01/28/2018   Procedure: RIGHT TOTAL SHOULDER ARTHROPLASTY;  Surgeon: [name], MD;  Location: [facility];  Service: Orthopedics;  Laterality: Right;   TOTAL SHOULDER ARTHROPLASTY Left 07/15/2018   Procedure: LEFT SHOULDER ANATOMIC TOTAL SHOULDER ARTHROPLASTY;  Surgeon: [name], MD;  Location: [place] [facility];  Service: Orthopedics;  Laterality: Left;   TRANSURETHRAL RESECTION OF PROSTATE  10/09/2011   Procedure: TRANSURETHRAL RESECTION OF THE  PROSTATE WITH GYRUS INSTRUMENTS;  Surgeon: Fredricka Bonine, MD;  [facility];  Service: Urology;  Laterality: N/A;   uvvvp     XI ROBOTIC ASSISTED INGUINAL HERNIA REPAIR WITH MESH Bilateral 07/31/2016   Procedure: XI ROBOTIC BILATERAL INGUINAL HERNIA REPAIR WITH MESH;  Surgeon: [name], MD;  [facility];  Service: General;  Laterality: Bilateral;    Current Outpatient Medications  Medication Sig Dispense Refill   acetaminophen (TYLENOL) 500 MG tablet Take 1,000 mg by mouth every 6 (six) hours as needed for moderate pain.     albuterol (VENTOLIN HFA) 108 (90 Base) MCG/ACT inhaler Inhale 2 puffs into the lungs every 4 (four) hours as needed for wheezing or shortness of breath. 18 g 2   atorvastatin (LIPITOR) 40 MG tablet TAKE 1 TABLET(40  MG) BY MOUTH DAILY 90 tablet 3   Calcium Carbonate-Vitamin D (CALCIUM + D PO) Take 1 tablet by mouth at bedtime.      dicyclomine (BENTYL) 10 MG capsule Take 1 capsule (10 mg total) by mouth 4 (four) times daily -  before meals and at bedtime. 120 capsule 11   esomeprazole (NEXIUM) 20 MG capsule Take 1 capsule (20 mg total) by mouth daily at 12 noon. 30 capsule 2   fentaNYL (DURAGESIC) 12 MCG/HR Place 1 patch onto the skin every 7 (seven) days.     fluticasone (FLONASE) 50 MCG/ACT nasal spray Place 1 spray into both nostrils at bedtime as needed for allergies.     hydroxypropyl methylcellulose / hypromellose (ISOPTO TEARS / GONIOVISC) 2.5 % ophthalmic solution Place 1 drop into both eyes 3 (three) times daily as needed for dry eyes.     latanoprost (XALATAN) 0.005 % ophthalmic solution Place 1 drop into both eyes at bedtime.     levocetirizine (XYZAL) 5 MG tablet TAKE 1 TABLET BY MOUTH EVERY EVENING 90 tablet 1   meclizine (ANTIVERT) 25 MG tablet Take 1 tablet (25 mg total) by mouth 3 (three) times daily as needed for dizziness. 30 tablet 0   metoprolol tartrate (LOPRESSOR) 25 MG tablet Take 25 mg by mouth as needed (when in afib).      Multiple Vitamin (MULTIVITAMIN WITH MINERALS) TABS tablet Take 1 tablet by mouth daily.     PRESCRIPTION MEDICATION Inhale into the lungs at bedtime. CPAP     sodium chloride (OCEAN) 0.65 % SOLN nasal spray Place 1 spray into both nostrils as needed for congestion. 88 mL 0   traZODone (DESYREL) 50 MG tablet Take 100 mg by mouth at bedtime.     vortioxetine HBr (TRINTELLIX) 10 MG TABS tablet Take 1 tablet (10 mg total) by mouth at bedtime. 30 tablet 5   XARELTO 20 MG TABS tablet TAKE 1 TABLET BY MOUTH DAILY WITH SUPPER 90 tablet 1   sucralfate (CARAFATE) 1 g tablet Take 1 tablet (1 g total) by mouth 4 (four) times daily -  with meals and at bedtime. (Patient not taking: Reported on 11/04/2022) 60 tablet 0   No current facility-administered medications for this visit.    Allergies as of 11/04/2022 - Review Complete 11/04/2022  Allergen Reaction Noted   Adhesive [tape] Other (See Comments) 03/23/2017   Other Other (See Comments) 10/06/2011    Family History  Problem Relation Age of Onset   Arthritis Mother    Hearing loss Mother    Hyperlipidemia Mother    Hypertension Mother    Deep vein thrombosis Mother    Bone cancer Father        deceased   Cancer Father    Diabetes Father    Prostate cancer Father    Ulcerative colitis Father    Diabetes Sister    Hearing loss Maternal Grandmother    Hearing loss Maternal Grandfather    Diabetes Paternal Grandfather     Social History   Socioeconomic History   Marital status: Married    Spouse name: Opal Sidles   Number of children: 1   Years of education: Not on file   Highest education level: Not on file  Occupational History   Occupation: disabled  Tobacco Use   Smoking status: Former    Packs/day: 1.00    Years: 15.00    Additional pack years: 0.00    Total pack years: 15.00    Types: Cigarettes  Quit date: 08/04/1983    Years since quitting: 39.2   Smokeless tobacco: Never   Tobacco comments:    quit smoking 1985  Vaping Use    Vaping Use: Never used  Substance and Sexual Activity   Alcohol use: Yes    Alcohol/week: 3.0 standard drinks of alcohol    Types: 3 Cans of beer per week    Comment: 4 beers a week   Drug use: No   Sexual activity: Not Currently  Other Topics Concern   Not on file  Social History Narrative   Married x 3 years in 2021.   1 daughter, 1 grand son and 1 grand daughter   Social Determinants of Health   Financial Resource Strain: Low Risk  (08/04/2022)   Overall Financial Resource Strain (CARDIA)    Difficulty of Paying Living Expenses: Not hard at all  Food Insecurity: No Food Insecurity (08/04/2022)   Hunger Vital Sign    Worried About Running Out of Food in the Last Year: Never true    Ran Out of Food in the Last Year: Never true  Transportation Needs: No Transportation Needs (08/04/2022)   PRAPARE - Hydrologist (Medical): No    Lack of Transportation (Non-Medical): No  Physical Activity: Sufficiently Active (08/04/2022)   Exercise Vital Sign    Days of Exercise per Week: 7 days    Minutes of Exercise per Session: 30 min  Stress: No Stress Concern Present (08/04/2022)   [place]    Feeling of Stress : Not at all  Social Connections: Moderately Integrated (08/04/2022)   Social Connection and Isolation Panel [NHANES]    Frequency of Communication with Friends and Family: Twice a week    Frequency of Social Gatherings with Friends and Family: Once a week    Attends Religious Services: Never    Marine scientist or Organizations: No    Attends Music therapist: 1 to 4 times per year    Marital Status: Married    Subjective: Review of Systems  Constitutional:  Negative for chills and fever.  HENT:  Negative for congestion and hearing loss.   Eyes:  Negative for blurred vision and double vision.  Respiratory:  Negative for cough and shortness of breath.   Cardiovascular:   Negative for chest pain and palpitations.  Gastrointestinal:  Positive for abdominal pain, diarrhea and heartburn. Negative for blood in stool, constipation, melena and vomiting.  Genitourinary:  Negative for dysuria and urgency.  Musculoskeletal:  Negative for joint pain and myalgias.  Skin:  Negative for itching and rash.  Neurological:  Negative for dizziness and headaches.  Psychiatric/Behavioral:  Negative for depression. The patient is not nervous/anxious.      Objective: BP 122/71 (BP Location: Left Arm, Patient Position: Sitting, Cuff Size: Large)   Pulse 64   Temp (!) 97.5 F (36.4 C) (Temporal)   Ht 5\' 7"  (1.702 m)   Wt 230 lb 11.2 oz (104.6 kg)   BMI 36.13 kg/m  Physical Exam Constitutional:      Appearance: Normal appearance.  HENT:     Head: Normocephalic and atraumatic.  Eyes:     Extraocular Movements: Extraocular movements intact.     Conjunctiva/sclera: Conjunctivae normal.  Cardiovascular:     Rate and Rhythm: Normal rate and regular rhythm.  Pulmonary:     Effort: Pulmonary effort is normal.     Breath sounds: Normal breath sounds.  Abdominal:     General: Bowel sounds are normal.     Palpations: Abdomen is soft.  Musculoskeletal:        General: Normal range of motion.     Cervical back: Normal range of motion and neck supple.  Skin:    General: Skin is warm.  Neurological:     General: No focal deficit present.     Mental Status: He is alert and oriented to person, place, and time.  Psychiatric:        Mood and Affect: Mood normal.        Behavior: Behavior normal.      Assessment: *Chronic GERD-status post Niesen fundoplication, well-controlled on Nexium *Abdominal pain-periumbilical *Chronic diarrhea  Plan: Patient's upper GI symptoms appear well-controlled.  Denies any acid reflux for me today.  Dysphagia improved s/p dilation.  Continue on Nexium.  Refilled today  Likely has component of irritable bowel syndrome with diarrhea  predominance. Some improvement with dicyclomine, Will increase to four times daily and see how he does.    Consider trial of cholestyramine in the future if no improvement on dicyclomine.  Consider 2-week course of Xifaxan.  Can consider trial of pancreatic enzyme replacement.   2-3 months.  11/04/2022 11:00 AM   Disclaimer: This note was dictated with voice recognition software. Similar sounding words can inadvertently be transcribed and may not be corrected upon review.

## 2022-11-04 NOTE — Patient Instructions (Addendum)
  I am going increase your dicyclomine to four times a day.  If not improved then let us know and I will send in cholestyramine   Continue on Nexium.  You can take the Carafate on top of this as needed for breakthrough upper GI symptoms.   Follow-up in 2-3 months   It was very nice seeing both you today.   Dr. Abbey Chatters

## 2022-11-17 ENCOUNTER — Emergency Department (HOSPITAL_BASED_OUTPATIENT_CLINIC_OR_DEPARTMENT_OTHER): Payer: Medicare HMO

## 2022-11-17 ENCOUNTER — Encounter (HOSPITAL_BASED_OUTPATIENT_CLINIC_OR_DEPARTMENT_OTHER): Payer: Self-pay | Admitting: Emergency Medicine

## 2022-11-17 ENCOUNTER — Inpatient Hospital Stay (HOSPITAL_BASED_OUTPATIENT_CLINIC_OR_DEPARTMENT_OTHER)
Admission: EM | Admit: 2022-11-17 | Discharge: 2022-11-20 | DRG: 337 | Disposition: A | Discharge status: Home / Self Care | Payer: Medicare HMO | Attending: Internal Medicine | Admitting: Internal Medicine

## 2022-11-17 ENCOUNTER — Other Ambulatory Visit: Payer: Self-pay

## 2022-11-17 DIAGNOSIS — Z83438 Family history of other disorder of lipoprotein metabolism and other lipidemia: Secondary | ICD-10-CM

## 2022-11-17 DIAGNOSIS — K4021 Bilateral inguinal hernia, without obstruction or gangrene, recurrent: Secondary | ICD-10-CM

## 2022-11-17 DIAGNOSIS — K580 Irritable bowel syndrome with diarrhea: Secondary | ICD-10-CM | POA: Diagnosis present

## 2022-11-17 DIAGNOSIS — G8929 Other chronic pain: Secondary | ICD-10-CM | POA: Diagnosis present

## 2022-11-17 DIAGNOSIS — Z6836 Body mass index (BMI) 36.0-36.9, adult: Secondary | ICD-10-CM | POA: Diagnosis not present

## 2022-11-17 DIAGNOSIS — Z7901 Long term (current) use of anticoagulants: Secondary | ICD-10-CM | POA: Diagnosis not present

## 2022-11-17 DIAGNOSIS — Z9889 Other specified postprocedural states: Secondary | ICD-10-CM

## 2022-11-17 DIAGNOSIS — I10 Essential (primary) hypertension: Secondary | ICD-10-CM | POA: Diagnosis not present

## 2022-11-17 DIAGNOSIS — Z8042 Family history of malignant neoplasm of prostate: Secondary | ICD-10-CM

## 2022-11-17 DIAGNOSIS — Z8249 Family history of ischemic heart disease and other diseases of the circulatory system: Secondary | ICD-10-CM

## 2022-11-17 DIAGNOSIS — K760 Fatty (change of) liver, not elsewhere classified: Secondary | ICD-10-CM | POA: Diagnosis not present

## 2022-11-17 DIAGNOSIS — N400 Enlarged prostate without lower urinary tract symptoms: Secondary | ICD-10-CM | POA: Diagnosis not present

## 2022-11-17 DIAGNOSIS — Z822 Family history of deafness and hearing loss: Secondary | ICD-10-CM

## 2022-11-17 DIAGNOSIS — I252 Old myocardial infarction: Secondary | ICD-10-CM | POA: Diagnosis not present

## 2022-11-17 DIAGNOSIS — K56609 Unspecified intestinal obstruction, unspecified as to partial versus complete obstruction: Secondary | ICD-10-CM | POA: Diagnosis not present

## 2022-11-17 DIAGNOSIS — Z87891 Personal history of nicotine dependence: Secondary | ICD-10-CM

## 2022-11-17 DIAGNOSIS — F32A Depression, unspecified: Secondary | ICD-10-CM | POA: Diagnosis not present

## 2022-11-17 DIAGNOSIS — E669 Obesity, unspecified: Secondary | ICD-10-CM | POA: Diagnosis present

## 2022-11-17 DIAGNOSIS — Z87442 Personal history of urinary calculi: Secondary | ICD-10-CM

## 2022-11-17 DIAGNOSIS — I2510 Atherosclerotic heart disease of native coronary artery without angina pectoris: Secondary | ICD-10-CM | POA: Diagnosis present

## 2022-11-17 DIAGNOSIS — G4733 Obstructive sleep apnea (adult) (pediatric): Secondary | ICD-10-CM | POA: Diagnosis present

## 2022-11-17 DIAGNOSIS — Z833 Family history of diabetes mellitus: Secondary | ICD-10-CM

## 2022-11-17 DIAGNOSIS — Z96651 Presence of right artificial knee joint: Secondary | ICD-10-CM | POA: Diagnosis present

## 2022-11-17 DIAGNOSIS — K219 Gastro-esophageal reflux disease without esophagitis: Secondary | ICD-10-CM | POA: Diagnosis not present

## 2022-11-17 DIAGNOSIS — I700 Atherosclerosis of aorta: Secondary | ICD-10-CM | POA: Diagnosis not present

## 2022-11-17 DIAGNOSIS — Z9079 Acquired absence of other genital organ(s): Secondary | ICD-10-CM

## 2022-11-17 DIAGNOSIS — Z86718 Personal history of other venous thrombosis and embolism: Secondary | ICD-10-CM

## 2022-11-17 DIAGNOSIS — I480 Paroxysmal atrial fibrillation: Secondary | ICD-10-CM | POA: Diagnosis not present

## 2022-11-17 DIAGNOSIS — K56600 Partial intestinal obstruction, unspecified as to cause: Secondary | ICD-10-CM | POA: Diagnosis not present

## 2022-11-17 DIAGNOSIS — K5651 Intestinal adhesions [bands], with partial obstruction: Principal | ICD-10-CM | POA: Diagnosis present

## 2022-11-17 DIAGNOSIS — Z8261 Family history of arthritis: Secondary | ICD-10-CM | POA: Diagnosis not present

## 2022-11-17 DIAGNOSIS — H9193 Unspecified hearing loss, bilateral: Secondary | ICD-10-CM | POA: Diagnosis present

## 2022-11-17 DIAGNOSIS — K565 Intestinal adhesions [bands], unspecified as to partial versus complete obstruction: Secondary | ICD-10-CM | POA: Diagnosis not present

## 2022-11-17 DIAGNOSIS — Z96611 Presence of right artificial shoulder joint: Secondary | ICD-10-CM | POA: Diagnosis present

## 2022-11-17 DIAGNOSIS — Z981 Arthrodesis status: Secondary | ICD-10-CM

## 2022-11-17 DIAGNOSIS — Z4659 Encounter for fitting and adjustment of other gastrointestinal appliance and device: Secondary | ICD-10-CM | POA: Diagnosis not present

## 2022-11-17 DIAGNOSIS — Z96612 Presence of left artificial shoulder joint: Secondary | ICD-10-CM | POA: Diagnosis present

## 2022-11-17 DIAGNOSIS — R109 Unspecified abdominal pain: Secondary | ICD-10-CM | POA: Diagnosis not present

## 2022-11-17 DIAGNOSIS — E785 Hyperlipidemia, unspecified: Secondary | ICD-10-CM | POA: Diagnosis present

## 2022-11-17 DIAGNOSIS — Z9049 Acquired absence of other specified parts of digestive tract: Secondary | ICD-10-CM

## 2022-11-17 DIAGNOSIS — Z79899 Other long term (current) drug therapy: Secondary | ICD-10-CM

## 2022-11-17 DIAGNOSIS — Z8719 Personal history of other diseases of the digestive system: Secondary | ICD-10-CM

## 2022-11-17 DIAGNOSIS — R140 Abdominal distension (gaseous): Secondary | ICD-10-CM | POA: Diagnosis not present

## 2022-11-17 LAB — LIPASE, BLOOD: Lipase: 22 U/L (ref 11–51)

## 2022-11-17 LAB — URINALYSIS, ROUTINE W REFLEX MICROSCOPIC
Bilirubin Urine: NEGATIVE
Glucose, UA: NEGATIVE mg/dL
Hgb urine dipstick: NEGATIVE
Ketones, ur: 15 mg/dL — AB
Leukocytes,Ua: NEGATIVE
Nitrite: NEGATIVE
Protein, ur: NEGATIVE mg/dL
Specific Gravity, Urine: 1.019 (ref 1.005–1.030)
pH: 6.5 (ref 5.0–8.0)

## 2022-11-17 LAB — CBC
HCT: 47.6 % (ref 39.0–52.0)
Hemoglobin: 16.9 g/dL (ref 13.0–17.0)
MCH: 31.5 pg (ref 26.0–34.0)
MCHC: 35.5 g/dL (ref 30.0–36.0)
MCV: 88.6 fL (ref 80.0–100.0)
Platelets: 182 10*3/uL (ref 150–400)
RBC: 5.37 MIL/uL (ref 4.22–5.81)
RDW: 12.9 % (ref 11.5–15.5)
WBC: 9.7 10*3/uL (ref 4.0–10.5)
nRBC: 0 % (ref 0.0–0.2)

## 2022-11-17 LAB — COMPREHENSIVE METABOLIC PANEL
ALT: 29 U/L (ref 0–44)
AST: 28 U/L (ref 15–41)
Albumin: 5.1 g/dL — ABNORMAL HIGH (ref 3.5–5.0)
Alkaline Phosphatase: 72 U/L (ref 38–126)
Anion gap: 14 (ref 5–15)
BUN: 13 mg/dL (ref 8–23)
CO2: 25 mmol/L (ref 22–32)
Calcium: 10.4 mg/dL — ABNORMAL HIGH (ref 8.9–10.3)
Chloride: 100 mmol/L (ref 98–111)
Creatinine, Ser: 1.05 mg/dL (ref 0.61–1.24)
GFR, Estimated: >60 mL/min (ref >60)
Glucose, Bld: 114 mg/dL — ABNORMAL HIGH (ref 70–99)
Potassium: 4 mmol/L (ref 3.5–5.1)
Sodium: 139 mmol/L (ref 135–145)
Total Bilirubin: 1 mg/dL (ref 0.3–1.2)
Total Protein: 7.7 g/dL (ref 6.5–8.1)

## 2022-11-17 LAB — LACTIC ACID, PLASMA: Lactic Acid, Venous: 1.2 mmol/L (ref 0.5–1.9)

## 2022-11-17 MED ORDER — LACTATED RINGERS IV BOLUS
1000.0000 mL | Freq: Once | INTRAVENOUS | Status: AC
  Administered 2022-11-17: 1000 mL via INTRAVENOUS

## 2022-11-17 MED ORDER — FENTANYL CITRATE PF 50 MCG/ML IJ SOSY
50.0000 ug | PREFILLED_SYRINGE | INTRAMUSCULAR | Status: AC | PRN
  Administered 2022-11-17 (×2): 50 ug via INTRAVENOUS
  Filled 2022-11-17 (×2): qty 1

## 2022-11-17 MED ORDER — ONDANSETRON HCL 4 MG/2ML IJ SOLN
4.0000 mg | Freq: Once | INTRAMUSCULAR | Status: AC
  Administered 2022-11-17: 4 mg via INTRAVENOUS
  Filled 2022-11-17: qty 2

## 2022-11-17 MED ORDER — LACTATED RINGERS IV SOLN: INTRAVENOUS | Status: AC

## 2022-11-17 MED ORDER — FENTANYL CITRATE PF 50 MCG/ML IJ SOSY
PREFILLED_SYRINGE | INTRAMUSCULAR | Status: AC
  Administered 2022-11-17: 50 ug via INTRAVENOUS
  Filled 2022-11-17: qty 1

## 2022-11-17 MED ORDER — ONDANSETRON HCL 4 MG/2ML IJ SOLN: 4.0000 mg | Freq: Four times a day (QID) | INTRAMUSCULAR | Status: DC | PRN

## 2022-11-17 MED ORDER — IOHEXOL 300 MG/ML  SOLN
100.0000 mL | Freq: Once | INTRAMUSCULAR | Status: AC | PRN
  Administered 2022-11-17: 85 mL via INTRAVENOUS

## 2022-11-17 MED ORDER — LORAZEPAM 2 MG/ML IJ SOLN
1.0000 mg | Freq: Once | INTRAMUSCULAR | Status: AC
  Administered 2022-11-17: 1 mg via INTRAVENOUS
  Filled 2022-11-17: qty 1

## 2022-11-17 MED ORDER — FENTANYL CITRATE PF 50 MCG/ML IJ SOSY
50.0000 ug | PREFILLED_SYRINGE | INTRAMUSCULAR | Status: AC | PRN
  Administered 2022-11-17 – 2022-11-18 (×4): 50 ug via INTRAVENOUS
  Filled 2022-11-17 (×4): qty 1

## 2022-11-17 MED ORDER — FENTANYL CITRATE PF 50 MCG/ML IJ SOSY
50.0000 ug | PREFILLED_SYRINGE | Freq: Once | INTRAMUSCULAR | Status: AC
  Administered 2022-11-17: 50 ug via INTRAVENOUS
  Filled 2022-11-17: qty 1

## 2022-11-17 NOTE — ED Provider Notes (Signed)
[place] EMERGENCY DEPARTMENT AT [facility] Provider Note   CSN: [id number] Arrival date & time: 11/17/22  1505     History  Chief Complaint  Patient presents with   Abdominal Pain    [name] is a 72 y.o. male.   Abdominal Pain Associated symptoms: nausea      72 year old male with complicated abdominal history to include chronic GERD status post Niesen fundoplication on Nexium daily, chronic diarrhea and abdominal discomfort, bilateral fat-containing inguinal hernias, history of gastritis, had previous biopsies negative for H. pylori, colonoscopy last noted in 06/22/2022 with healthy mucosa with 3 tubular adenomas removed, presenting with acute on chronic periumbilical abdominal pain that has been intermittent in nature.  Some pain in relation to his bilateral inguinal hernias which are fat-containing only.  He has a history of cholecystectomy.  He has been on dicyclomine twice daily and last followed up with gastroenterology outpatient on 11/04/22.  He continues to endorse a burning-like discomfort throughout his abdomen.  He has been unable to tolerate much oral intake and endorses severe worsening of his pain today.  He states that he is not passing gas.  No vomiting although he has had no much in terms of oral intake in the last few days.  No fevers or chills, no urinary symptoms.  Home Medications Prior to Admission medications   Medication Sig Start Date End Date Taking? Authorizing Provider  acetaminophen (TYLENOL) 500 MG tablet Take 1,000 mg by mouth every 6 (six) hours as needed for moderate pain.    [provider]  albuterol (VENTOLIN HFA) 108 (90 Base) MCG/ACT inhaler Inhale 2 puffs into the lungs every 4 (four) hours as needed for wheezing or shortness of breath. 12/11/19   Donita Brooks, MD  atorvastatin (LIPITOR) 40 MG tablet TAKE 1 TABLET(40 MG) BY MOUTH DAILY 10/07/22   Billie Lade, MD  Calcium Carbonate-Vitamin D (CALCIUM + D PO) Take 1  tablet by mouth at bedtime.     [provider]  dicyclomine (BENTYL) 10 MG capsule Take 1 capsule (10 mg total) by mouth 4 (four) times daily -  before meals and at bedtime. 10/01/22 10/01/23  Lanelle Bal, DO  esomeprazole (NEXIUM) 20 MG capsule Take 1 capsule (20 mg total) by mouth daily at 12 noon. 11/04/22 11/04/23  Lanelle Bal, DO  fentaNYL (DURAGESIC) 12 MCG/HR Place 1 patch onto the skin every 7 (seven) days. 08/29/22   [provider]  fluticasone (FLONASE) 50 MCG/ACT nasal spray Place 1 spray into both nostrils at bedtime as needed for allergies.    [provider]  hydroxypropyl methylcellulose / hypromellose (ISOPTO TEARS / GONIOVISC) 2.5 % ophthalmic solution Place 1 drop into both eyes 3 (three) times daily as needed for dry eyes.    [provider]  latanoprost (XALATAN) 0.005 % ophthalmic solution Place 1 drop into both eyes at bedtime. 05/23/22   [provider]  levocetirizine (XYZAL) 5 MG tablet TAKE 1 TABLET BY MOUTH EVERY EVENING 08/26/22   Billie Lade, MD  meclizine (ANTIVERT) 25 MG tablet Take 1 tablet (25 mg total) by mouth 3 (three) times daily as needed for dizziness. 07/20/22   Billie Lade, MD  metoprolol tartrate (LOPRESSOR) 25 MG tablet Take 25 mg by mouth as needed (when in afib).    [provider]  Multiple Vitamin (MULTIVITAMIN WITH MINERALS) TABS tablet Take 1 tablet by mouth daily.    [provider]  PRESCRIPTION MEDICATION Inhale  into the lungs at bedtime. CPAP    [provider]  sodium chloride (OCEAN) 0.65 % SOLN nasal spray Place 1 spray into both nostrils as needed for congestion. 10/28/20   Valentino Nose, NP  traZODone (DESYREL) 50 MG tablet Take 100 mg by mouth at bedtime. 09/30/12   [provider]  vortioxetine HBr (TRINTELLIX) 10 MG TABS tablet Take 1 tablet (10 mg total) by mouth at bedtime. 12/11/19   Donita Brooks, MD  XARELTO 20 MG TABS tablet TAKE 1  TABLET BY MOUTH DAILY WITH SUPPER 10/01/22   Jodelle Red, MD      Allergies    Adhesive [tape] and Other    Review of Systems   Review of Systems  Gastrointestinal:  Positive for abdominal pain and nausea.  All other systems reviewed and are negative.   Physical Exam Updated Vital Signs BP (!) 154/94   Pulse 82   Temp 98.2 F (36.8 C) (Oral)   Resp 17   Ht  (1.702 m)   Wt 105.7 kg   SpO2 95%   BMI 36.49 kg/m  Physical Exam Vitals and nursing note reviewed.  Constitutional:      General: He is not in acute distress.    Appearance: He is well-developed.  HENT:     Head: Normocephalic and atraumatic.  Eyes:     Conjunctiva/sclera: Conjunctivae normal.  Cardiovascular:     Rate and Rhythm: Normal rate and regular rhythm.     Heart sounds: No murmur heard. Pulmonary:     Effort: Pulmonary effort is normal. No respiratory distress.     Breath sounds: Normal breath sounds.  Abdominal:     General: There is distension.     Palpations: Abdomen is soft.     Tenderness: There is generalized abdominal tenderness.  Musculoskeletal:        General: No swelling.     Cervical back: Neck supple.  Skin:    General: Skin is warm and dry.     Capillary Refill: Capillary refill takes less than 2 seconds.  Neurological:     Mental Status: He is alert.  Psychiatric:        Mood and Affect: Mood normal.     ED Results / Procedures / Treatments   Labs (all labs ordered are listed, but only abnormal results are displayed) Labs Reviewed  COMPREHENSIVE METABOLIC PANEL - Abnormal; Notable for the following components:      Result Value   Glucose, Bld 114 (*)    Calcium 10.4 (*)    Albumin 5.1 (*)    All other components within normal limits  URINALYSIS, ROUTINE W REFLEX MICROSCOPIC - Abnormal; Notable for the following components:   Ketones, ur 15 (*)    All other components within normal limits  LIPASE, BLOOD  CBC  LACTIC ACID, PLASMA     EKG None  Radiology DG Abd Portable 1 View  Result Date: 11/17/2022 CLINICAL DATA:  NG tube placement EXAM: PORTABLE ABDOMEN - 1 VIEW COMPARISON:  CT abdomen and pelvis 11/17/2022 FINDINGS: Enteric tube tip and side-port in the stomach. Bowel obstruction better demonstrated on same day CT abdomen and pelvis. IMPRESSION: Enteric tube tip and side-port in the stomach. Electronically Signed   By: Minerva Fester M.D.   On: 11/17/2022 20:16   CT ABDOMEN PELVIS W CONTRAST  Result Date: 11/17/2022 CLINICAL DATA:  Acute on chronic abdominal pain. Abdominal distension, known abdominal hernia. EXAM: CT ABDOMEN AND PELVIS WITH CONTRAST TECHNIQUE:  Multidetector CT imaging of the abdomen and pelvis was performed using the standard protocol following bolus administration of intravenous contrast. RADIATION DOSE REDUCTION: This exam was performed according to the departmental dose-optimization program which includes automated exposure control, adjustment of the mA and/or kV according to patient size and/or use of iterative reconstruction technique. CONTRAST:  85mL OMNIPAQUE IOHEXOL 300 MG/ML  SOLN COMPARISON:  None Available. FINDINGS: Lower chest: No acute abnormality. Hepatobiliary: Mild hepatic steatosis. No enhancing intrahepatic mass. No intra or extrahepatic biliary ductal dilation. Status post cholecystectomy. Pancreas: Unremarkable Spleen: Unremarkable Adrenals/Urinary Tract: The adrenal glands are unremarkable. The kidneys are normal in size and position. Stable cortical hypodensities within the lower pole the left kidney which are too small to accurately characterize but likely represent small cortical cysts. No follow-up imaging is recommended for these lesions. The kidneys are otherwise unremarkable. The bladder is unremarkable. Stomach/Bowel: A mid small bowel obstruction is identified with a single point of transition seen within the periumbilical region at axial image # 61/2. The proximal bowel is  dilated fluid-filled and the distal small bowel decompressed. No associated mass is identified and the settings are most suggestive of an underlying adhesion. Mild sigmoid diverticulosis. The stomach, small bowel, and large bowel are otherwise unremarkable. Status post appendectomy. No free intraperitoneal gas or fluid. Vascular/Lymphatic: Aortic atherosclerosis. No enlarged abdominal or pelvic lymph nodes. Reproductive: Moderate prostatic hypertrophy. Other: Small bilateral fat containing inguinal hernias are present. Musculoskeletal: Degenerative changes are seen within the lumbar spine. No acute bone abnormality. No lytic or blastic bone lesion. IMPRESSION: 1. Mid small bowel obstruction with a single point of transition within the periumbilical region. 2. Mild hepatic steatosis. 3. Mild sigmoid diverticulosis. 4. Moderate prostatic hypertrophy. 5. Small bilateral fat containing inguinal hernias. 6. Aortic atherosclerosis. Aortic Atherosclerosis (ICD10-I70.0). Electronically Signed   By: Helyn Numbers M.D.   On: 11/17/2022 19:13    Procedures Procedures    Medications Ordered in ED Medications  LORazepam (ATIVAN) injection 1 mg (has no administration in time range)  fentaNYL (SUBLIMAZE) injection 50 mcg (50 mcg Intravenous Given 11/17/22 1802)  lactated ringers bolus 1,000 mL (0 mLs Intravenous Stopped 11/17/22 1926)  ondansetron (ZOFRAN) injection 4 mg (4 mg Intravenous Given 11/17/22 1802)  iohexol (OMNIPAQUE) 300 MG/ML solution 100 mL (85 mLs Intravenous Contrast Given 11/17/22 1816)  fentaNYL (SUBLIMAZE) injection 50 mcg (50 mcg Intravenous Given 11/17/22 2109)    ED Course/ Medical Decision Making/ A&P Clinical Course as of 11/17/22 2126  Tue Nov 17, 2022  1756 Lactic Acid, Venous: 1.2 [JL]    Clinical Course User Index [JL] Ernie Avena, MD                             Medical Decision Making Amount and/or Complexity of Data Reviewed Labs: ordered. Decision-making details documented  in ED Course. Radiology: ordered.  Risk Prescription drug management. Decision regarding hospitalization.     72 year old male with complicated abdominal history to include chronic GERD status post Niesen fundoplication on Nexium daily, chronic diarrhea and abdominal discomfort, bilateral fat-containing inguinal hernias, history of gastritis, had previous biopsies negative for H. pylori, colonoscopy last noted in 06/22/2022 with healthy mucosa with 3 tubular adenomas removed, presenting with acute on chronic periumbilical abdominal pain that has been intermittent in nature.  Some pain in relation to his bilateral inguinal hernias which are fat-containing only.  He has a history of cholecystectomy.  He has been on dicyclomine twice daily and  last followed up with gastroenterology outpatient on 11/04/22.  He continues to endorse a burning-like discomfort throughout his abdomen.  He has been unable to tolerate much oral intake and endorses severe worsening of his pain today.  He states that he is not passing gas.  No vomiting although he has had no much in terms of oral intake in the last few days.  No fevers or chills, no urinary symptoms.  On arrival, the patient was not tachycardic or tachypneic, hypertensive BP 171/89, saturating 94% on room air, sinus rhythm noted on cardiac telemetry, physical exam significant for generalized abdominal tenderness to palpation, no rebound or guarding, mild abdominal tenderness and distention present.  No clear incarcerated hernia noted on exam.  Patient presenting with inability to pass gas, generalized burning abdominal discomfort, most likely differential includes acute on chronic abdominal pain and gastritis and GERD versus new small bowel obstruction or hernia.  The access was obtained and the patient was administered IV opiates, IV fluid bolus and IV antiemetics.  Will obtain CT abdomen pelvis to further assess.  Labs: Analysis without evidence of UTI or  hematuria, lactic normal at 1.2, CMP without significant electrolyte abnormality, mildly elevated serum calcium at 10.4, otherwise unremarkable with normal renal and liver function, lipase normal, CBC without a leukocytosis or anemia  Imaging: IMPRESSION:  1. Mid small bowel obstruction with a single point of transition  within the periumbilical region.  2. Mild hepatic steatosis.  3. Mild sigmoid diverticulosis.  4. Moderate prostatic hypertrophy.  5. Small bilateral fat containing inguinal hernias.  6. Aortic atherosclerosis.    Aortic Atherosclerosis (ICD10-I70.0).    Patient was informed of the diagnosis of small bowel obstruction.  An NG tube was placed by nursing with good output. IMPRESSION:  Enteric tube tip and side-port in the stomach.   Plan for hospital admission, hospitalist consulted for admission, plan for general surgery consultation non emergently inpatient.   Spoke with Dr. Loney Loh who accepted the patient in admission, requested general surgery consultation which was performed.  I spoke with Dr. Phylliss Blakes who will see the patient in nonemergent consultation inpatient.   Final Clinical Impression(s) / ED Diagnoses Final diagnoses:  SBO (small bowel obstruction)    Rx / DC Orders ED Discharge Orders     None         Ernie Avena, MD 11/17/22 2134

## 2022-11-17 NOTE — ED Triage Notes (Signed)
Pt arrives to ED with c/o chronic abdominal pain that worsened today after eating breakfast. He notes abdominal swelling and two abdominal hernias.

## 2022-11-17 NOTE — ED Notes (Signed)
Pt reporting continued abdominal discomfort following NG tube placement and fentanyl administration. Lawsing MD made aware, ativan ordered

## 2022-11-17 NOTE — Plan of Care (Signed)
72 year old male with a past medical history of chronic back pain, BPH, DVT, IBS, nephrolithiasis, hypertension, hyperlipidemia, paroxysmal A-fib, CAD, chronic GERD status post Nissen fundoplication, inguinal hernias, gastritis presented to the ED complaining of acute on chronic abdominal pain.  Afebrile.  Labs showing no leukocytosis or anemia, normal LFTs, normal lipase, UA without signs of infection, lactic acid normal.  CT abdomen pelvis with contrast: "IMPRESSION: 1. Mid small bowel obstruction with a single point of transition within the periumbilical region. 2. Mild hepatic steatosis. 3. Mild sigmoid diverticulosis. 4. Moderate prostatic hypertrophy. 5. Small bilateral fat containing inguinal hernias. 6. Aortic atherosclerosis."   Patient received fentanyl, Ativan, Zofran, and 1 L LR bolus.  NG tube placed.  ED physician discussed with Dr. Fredricka Bonine, general surgery will see the patient in consultation.  [facility] will assume care on arrival to accepting facility. Until arrival, care as per EDP. However, [facility] available 24/7 for questions and assistance.  Nursing staff, please page [facility] Admits and Consults [phone]) as soon as the patient arrives to the hospital.

## 2022-11-17 NOTE — ED Notes (Signed)
[facility] at bedside to transport pt to [facility] 

## 2022-11-18 ENCOUNTER — Encounter (HOSPITAL_COMMUNITY): Payer: Self-pay | Admitting: Internal Medicine

## 2022-11-18 ENCOUNTER — Inpatient Hospital Stay (HOSPITAL_COMMUNITY): Payer: Medicare HMO

## 2022-11-18 DIAGNOSIS — K56609 Unspecified intestinal obstruction, unspecified as to partial versus complete obstruction: Secondary | ICD-10-CM | POA: Diagnosis not present

## 2022-11-18 DIAGNOSIS — E669 Obesity, unspecified: Secondary | ICD-10-CM

## 2022-11-18 DIAGNOSIS — K219 Gastro-esophageal reflux disease without esophagitis: Secondary | ICD-10-CM

## 2022-11-18 DIAGNOSIS — I10 Essential (primary) hypertension: Secondary | ICD-10-CM | POA: Diagnosis not present

## 2022-11-18 DIAGNOSIS — K4021 Bilateral inguinal hernia, without obstruction or gangrene, recurrent: Secondary | ICD-10-CM

## 2022-11-18 DIAGNOSIS — Z9889 Other specified postprocedural states: Secondary | ICD-10-CM

## 2022-11-18 DIAGNOSIS — G4733 Obstructive sleep apnea (adult) (pediatric): Secondary | ICD-10-CM

## 2022-11-18 DIAGNOSIS — Z86718 Personal history of other venous thrombosis and embolism: Secondary | ICD-10-CM

## 2022-11-18 DIAGNOSIS — I480 Paroxysmal atrial fibrillation: Secondary | ICD-10-CM

## 2022-11-18 HISTORY — DX: Gastro-esophageal reflux disease without esophagitis: K21.9

## 2022-11-18 LAB — HEPARIN LEVEL (UNFRACTIONATED): Heparin Unfractionated: 0.55 IU/mL (ref 0.30–0.70)

## 2022-11-18 LAB — APTT: aPTT: 64 seconds — ABNORMAL HIGH (ref 24–36)

## 2022-11-18 MED ORDER — HYDROMORPHONE HCL 1 MG/ML IJ SOLN: 0.5000 mg | INTRAMUSCULAR | Status: DC | PRN

## 2022-11-18 MED ORDER — ACETAMINOPHEN 650 MG RE SUPP: 650.0000 mg | Freq: Four times a day (QID) | RECTAL | Status: DC | PRN

## 2022-11-18 MED ORDER — HEPARIN (PORCINE) 25000 UT/250ML-% IV SOLN
1500.0000 [IU]/h | INTRAVENOUS | Status: DC
  Administered 2022-11-18: 1350 [IU]/h via INTRAVENOUS
  Administered 2022-11-19: 1400 [IU]/h via INTRAVENOUS
  Filled 2022-11-18 (×2): qty 250

## 2022-11-18 MED ORDER — ACETAMINOPHEN 160 MG/5ML PO SOLN: 650.0000 mg | Freq: Four times a day (QID) | ORAL | Status: DC | PRN

## 2022-11-18 MED ORDER — HEPARIN BOLUS VIA INFUSION
4000.0000 [IU] | Freq: Once | INTRAVENOUS | Status: AC
  Administered 2022-11-18: 4000 [IU] via INTRAVENOUS
  Filled 2022-11-18: qty 4000

## 2022-11-18 MED ORDER — ALBUTEROL SULFATE (2.5 MG/3ML) 0.083% IN NEBU: 2.5000 mg | INHALATION_SOLUTION | Freq: Four times a day (QID) | RESPIRATORY_TRACT | Status: DC | PRN

## 2022-11-18 MED ORDER — MORPHINE SULFATE (PF) 2 MG/ML IV SOLN
2.0000 mg | INTRAVENOUS | Status: DC | PRN
  Administered 2022-11-18 – 2022-11-19 (×4): 2 mg via INTRAVENOUS
  Filled 2022-11-18 (×4): qty 1

## 2022-11-18 MED ORDER — SODIUM CHLORIDE 0.9% FLUSH
3.0000 mL | Freq: Two times a day (BID) | INTRAVENOUS | Status: DC
  Administered 2022-11-18 – 2022-11-20 (×4): 3 mL via INTRAVENOUS

## 2022-11-18 MED ORDER — PANTOPRAZOLE SODIUM 40 MG IV SOLR
40.0000 mg | INTRAVENOUS | Status: DC
  Administered 2022-11-18: 40 mg via INTRAVENOUS
  Filled 2022-11-18: qty 10

## 2022-11-18 MED ORDER — ENOXAPARIN SODIUM 40 MG/0.4ML IJ SOSY: 40.0000 mg | PREFILLED_SYRINGE | INTRAMUSCULAR | Status: DC

## 2022-11-18 MED ORDER — HYDRALAZINE HCL 20 MG/ML IJ SOLN: 10.0000 mg | INTRAMUSCULAR | Status: DC | PRN

## 2022-11-18 MED ORDER — DIATRIZOATE MEGLUMINE & SODIUM 66-10 % PO SOLN
90.0000 mL | Freq: Once | ORAL | Status: AC
  Administered 2022-11-18: 90 mL via NASOGASTRIC
  Filled 2022-11-18: qty 90

## 2022-11-18 NOTE — Progress Notes (Signed)
ANTICOAGULATION CONSULT NOTE  Pharmacy Consult for Heparin Indication: atrial fibrillation and hx VTE  Allergies  Allergen Reactions   Adhesive [Tape] Other (See Comments)    Causes blisters - pls use paper tape   Other Other (See Comments)    "Adhesives"Pt allergic to some tapes--pt request that we only use paper tape!!    Patient Measurements: Height:  (170.2 cm) Weight: 105.7 kg (233 lb) IBW/kg (Calculated) : 66.1 Heparin Dosing Weight: 90 kg  Vital Signs: Temp: 98.1 F (36.7 C) (04/17 0731) Temp Source: Oral (04/17 0731) BP: 140/84 (04/17 0731) Pulse Rate: 77 (04/17 0731)  Labs: Recent Labs    11/17/22 1520  HGB 16.9  HCT 47.6  PLT 182  CREATININE 1.05    Estimated Creatinine Clearance: 72.6 mL/min (by C-G formula based on SCr of 1.05 mg/dL).   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   Cataracts, both eyes    worst on left   Colon polyps    Complication of anesthesia    pt had spinal for a knee replacement--"woke up" during surgery--and sick after surgery   Depression    Diverticulosis    DVT (deep venous thrombosis) 2001 or 2002   right leg-required hospitalization x 8 days   DVT (deep venous thrombosis) 1996   right   Glaucoma    both eyes   H/O hiatal hernia    Hearing impaired person, bilateral    bilateral   Hemorrhoid    History of IBS    History of kidney stones    x 1-passed   Hyperlipidemia 2012   Hypertension    Lumbago    Lumbar post-laminectomy syndrome    Myocardial infarction December 2019   PAF (paroxysmal atrial fibrillation)    PONV (postoperative nausea and  vomiting)    Sleep apnea    pt uses cpap - setting of 16   Ulcer 1966   patient denies   Ulcerative colitis    patient denies   Wears glasses    Wears hearing aid     Assessment: 72 YO male with medical history significant for atrial fibrillation and hx of VTE on Xarelto  QD PTA who is admitted with complaints for abdominal pain now with concerns for SBO. Pharmacy consulted to manage heparin while Xarelto is on hold. Last dose Xarelto 4/15. Anticipate heparin level to be falsely elevated due to recent Xarelto use. Will use aPTT to adjust heparin.   Goal of Therapy:  Heparin level 0.3-0.7 units/ml aPTT 66-102 seconds Monitor platelets by anticoagulation protocol: Yes   Plan:  Give heparin 4000 units IV bolus x1 Start heparin infusion at 1350 units/hr Check heparin level in 8 hours and daily while on heparin Continue to monitor H&H and platelets  Thank you for allowing pharmacy to be a part of this patient's care.  Thelma Barge, PharmD Clinical Pharmacist

## 2022-11-18 NOTE — H&P (Signed)
History and Physical    Patient: [name]:[id number] DOB: 1951/06/27 DOA: 11/17/2022 DOS: the patient was seen and examined on 11/18/2022 PCP: Billie Lade, MD  Patient coming from: [facility] transfer  Chief Complaint:  Chief Complaint  Patient presents with   Abdominal Pain   HPI: JAYRON MAQUEDA is a 72 y.o. male with medical history significant of hypertension, hyperlipidemia, paroxysmal atrial fibrillation, CAD, history of remote DVT, diarrhea, and hiatal hernia with GERD s/p Nissen fundoplication who presented with complaints of periumbilical abdominal pain.  Patient reports symptoms have been present since November 2023.  Patient describes it as a burning pain.  He had been referred to Dr. Marletta Lor of gastroenterology in November and underwent EGD/colonoscopy on 06/18/2022 which noted healthy mucosa throughout the colon with 3 tubular adenomas which were removed and mild gastritis negative for H. Pylori.  Patient reported that he had been having diarrhea anywhere to 4-5 times/day average with brown stools.  Denies seeing any blood present. He had been diagnosed with IBS started on Nexium, dicyclomine, but had not reported significant minutes symptoms.  He had last been seen on 4/3 where plan was to consider cholestyramine if no improvement with dicyclomine and/or trial of pancreatic enzyme replacement.  He reports having bilateral inguinal hernias for which he is scheduled appointment scheduled for May 4 with Dr. Lovell Sheehan for repair.  Over the last few days he had not been able to eat or drinking much.  Reported having worsening abdominal pain that was much more severe than baseline along with abdominal distention for which he came to the emergency department for further evaluation.  Denied having any fevers, chills, shortness of breath, dysuria, or vomiting.  He reports last having 2 loose bowel movements yesterday.  He has had prior abdominal surgery including appendectomy  cholecystectomy, Nissen fundoplication, and prior hernia repair.  In the emergency department patient was noted to be afebrile with blood pressures elevated to 171/89, and O2 saturations initially noted to be as low as 88% on room air temporarily on 2 L nasal cannula oxygen with improvement to greater than 92%.  Labs significant for calcium of 10.4, and all other labs relatively within normal limits.  Urinalysis noted no signs of infection.  CT scan of the abdomen pelvis noted a mid and small obstruction with a single portion of transition point within the periumbilical region small bilateral fat-containing hernias, hypertrophy.   Review of Systems: As mentioned in the history of present illness. All other systems reviewed and are negative. 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   Cataracts, both eyes    worst on left   Colon polyps    Complication of anesthesia    pt had spinal for a knee replacement--"woke up" during surgery--and sick after surgery   Depression    Diverticulosis    DVT (deep venous thrombosis) 2001 or 2002   right leg-required hospitalization x 8 days   DVT (deep venous thrombosis) 1996   right   Glaucoma    both eyes   H/O hiatal hernia  Hearing impaired person, bilateral    bilateral   Hemorrhoid    History of IBS    History of kidney stones    x 1-passed   Hyperlipidemia 2012   Hypertension    Lumbago    Lumbar post-laminectomy syndrome    Myocardial infarction December 2019   PAF (paroxysmal atrial fibrillation)    PONV (postoperative nausea and vomiting)    Sleep apnea    pt uses cpap - setting of 16    Ulcer 1966   patient denies   Ulcerative colitis    patient denies   Wears glasses    Wears hearing aid    Past Surgical History:  Procedure Laterality Date   APPENDECTOMY  1993   BACK SURGERY  2008   bone spur removed    BALLOON DILATION N/A 06/18/2022   Procedure: BALLOON DILATION;  Surgeon: Lanelle Bal, DO;  Location: [facility];  Service: Endoscopy;  Laterality: N/A;   bilateral carpal tunnel release 2009     BIOPSY  06/18/2022   Procedure: BIOPSY;  Surgeon: Lanelle Bal, DO;  [facility]: [facility];  Service: Endoscopy;;   cervical fusion 2011--pt has good range of motion neck     CHOLECYSTECTOMY  1994   COLONOSCOPY WITH PROPOFOL N/A 06/18/2022   Procedure: COLONOSCOPY WITH PROPOFOL;  Surgeon: Lanelle Bal, DO;  [facility]: [facility];  Service: Endoscopy;  Laterality: N/A;  8:15 AM   ESOPHAGOGASTRODUODENOSCOPY (EGD) WITH PROPOFOL N/A 06/18/2022   Procedure: ESOPHAGOGASTRODUODENOSCOPY (EGD) WITH PROPOFOL;  Surgeon: Lanelle Bal, DO;  [facility]: [facility];  Service: Endoscopy;  Laterality: N/A;   EYE SURGERY N/A    [employer] 07/13/2020   HERNIA REPAIR  2008   INSERTION OF MESH Bilateral 07/31/2016   Procedure: INSERTION OF MESH;  Surgeon: Luretha Murphy, MD;  Location: [facility];  Service: General;  Laterality: Bilateral;   JOINT REPLACEMENT Bilateral    2001 left total knee and 1985 right total knee   LUMBAR LAMINECTOMY/DECOMPRESSION MICRODISCECTOMY N/A 07/24/2021   Procedure: DECOMPRESSION AND REMOVAL OF CYST LUMBAR FIVE THROUGH SACRAL ONE;  Surgeon: Venita Lick, MD;  Location: [facility];  Service: Orthopedics;  Laterality: N/A;   MASS EXCISION Left 01/04/2014   Procedure: LEFT MIDDLE FINGER MASS EXCISION WITH NAILBED REPAIR AND ADVANCEMENT FLAP;  Surgeon: Dominica Severin, MD;  Location: [facility];  Service: Orthopedics;  Laterality: Left;   mass removed from 2nd finger Left 03/2013   squamous carcinoma   nissen fundoplication 1995      POLYPECTOMY  06/18/2022   Procedure: POLYPECTOMY;  Surgeon: Lanelle Bal, DO;  [facility]: [facility];  Service: Endoscopy;;   PROSTATE SURGERY  2014   TURP   right wrist fusion 12/12 - pt wearing brace on the wrist-not started physical therapy yet Right    fusion prevent limited ROM to right wrist   SPINE SURGERY  2009   cervical spine fusion   thyroid nodule     removed   TOTAL KNEE REVISION Right 11/17/2013   Procedure: RIGHT KNEE POLYETHYLENE REVISION, bone graft;  Surgeon: Loanne Drilling, MD;  Location: [facility];  Service: Orthopedics;  Laterality: Right;   TOTAL SHOULDER ARTHROPLASTY Right 01/28/2018   Procedure: RIGHT TOTAL SHOULDER ARTHROPLASTY;  Surgeon: Beverely Low, MD;  Location: [facility];  Service: Orthopedics;  Laterality: Right;   TOTAL SHOULDER ARTHROPLASTY Left 07/15/2018   Procedure: LEFT SHOULDER ANATOMIC TOTAL SHOULDER ARTHROPLASTY;  Surgeon: Beverely Low, MD;  Location: [facility];  Service: Orthopedics;  Laterality: Left;   TRANSURETHRAL RESECTION OF PROSTATE  10/09/2011   Procedure: TRANSURETHRAL RESECTION OF THE PROSTATE WITH GYRUS INSTRUMENTS;  Surgeon: Antony Haste, MD;  Location: [facility];  Service: Urology;  Laterality: N/A;   uvvvp     XI ROBOTIC ASSISTED INGUINAL HERNIA REPAIR WITH MESH Bilateral 07/31/2016   Procedure: XI ROBOTIC BILATERAL INGUINAL HERNIA REPAIR WITH MESH;  Surgeon: Luretha Murphy, MD;  Location: [facility];  Service: General;  Laterality: Bilateral;   Social History:  reports that he quit smoking about 39 years ago. His smoking use included cigarettes. He has a 15.00 pack-year smoking history. He has never used smokeless tobacco. He reports current alcohol use of about 3.0 standard drinks of alcohol per week. He reports that he does not use drugs.  Allergies  Allergen Reactions   Adhesive [Tape] Other (See Comments)    Causes blisters - pls use paper tape   Other Other (See Comments)    "Adhesives"Pt allergic to some tapes--pt request  that we only use paper tape!!    Family History  Problem Relation Age of Onset   Arthritis Mother    Hearing loss Mother    Hyperlipidemia Mother    Hypertension Mother    Deep vein thrombosis Mother    Bone cancer Father        deceased   Cancer Father    Diabetes Father    Prostate cancer Father    Ulcerative colitis Father    Diabetes Sister    Hearing loss Maternal Grandmother    Hearing loss Maternal Grandfather    Diabetes Paternal Grandfather     Prior to Admission medications   Medication Sig Start Date End Date Taking? Authorizing Provider  acetaminophen (TYLENOL) 500 MG tablet Take 1,000 mg by mouth every 6 (six) hours as needed for moderate pain.    [provider]  albuterol (VENTOLIN HFA) 108 (90 Base) MCG/ACT inhaler Inhale 2 puffs into the lungs every 4 (four) hours as needed for wheezing or shortness of breath. 12/11/19   Donita Brooks, MD  atorvastatin (LIPITOR) 40 MG tablet TAKE 1 TABLET(40 MG) BY MOUTH DAILY 10/07/22   Billie Lade, MD  Calcium Carbonate-Vitamin D (CALCIUM + D PO) Take 1 tablet by mouth at bedtime.     [provider]  dicyclomine (BENTYL) 10 MG capsule Take 1 capsule (10 mg total) by mouth 4 (four) times daily -  before meals and at bedtime. 10/01/22 10/01/23  Lanelle Bal, DO  esomeprazole (NEXIUM) 20 MG capsule Take 1 capsule (20 mg total) by mouth daily at 12 noon. 11/04/22 11/04/23  Lanelle Bal, DO  fentaNYL (DURAGESIC) 12 MCG/HR Place 1 patch onto the skin every 7 (seven) days. 08/29/22   [provider]  fluticasone (FLONASE) 50 MCG/ACT nasal spray Place 1 spray into both nostrils at bedtime as needed for allergies.    [provider]  hydroxypropyl methylcellulose / hypromellose (ISOPTO TEARS / GONIOVISC) 2.5 % ophthalmic solution Place 1 drop into both eyes 3 (three) times daily as needed for dry eyes.    [provider]  latanoprost (XALATAN) 0.005 % ophthalmic solution Place 1 drop  into both eyes at bedtime. 05/23/22   [provider]  levocetirizine (XYZAL) 5 MG tablet TAKE 1 TABLET BY MOUTH EVERY EVENING 08/26/22   Billie Lade, MD  meclizine (ANTIVERT) 25 MG tablet Take 1 tablet (25 mg total) by mouth 3 (three) times daily as needed for  dizziness. 07/20/22   Billie Lade, MD  metoprolol tartrate (LOPRESSOR) 25 MG tablet Take 25 mg by mouth as needed (when in afib).    [provider]  Multiple Vitamin (MULTIVITAMIN WITH MINERALS) TABS tablet Take 1 tablet by mouth daily.    [provider]  PRESCRIPTION MEDICATION Inhale into the lungs at bedtime. CPAP    [provider]  sodium chloride (OCEAN) 0.65 % SOLN nasal spray Place 1 spray into both nostrils as needed for congestion. 10/28/20   Valentino Nose, NP  traZODone (DESYREL) 50 MG tablet Take 100 mg by mouth at bedtime. 09/30/12   [provider]  vortioxetine HBr (TRINTELLIX) 10 MG TABS tablet Take 1 tablet (10 mg total) by mouth at bedtime. 12/11/19   Donita Brooks, MD  XARELTO 20 MG TABS tablet TAKE 1 TABLET BY MOUTH DAILY WITH SUPPER 10/01/22   Jodelle Red, MD    Physical Exam: Vitals:   11/17/22 2200 11/17/22 2230 11/17/22 2302 11/18/22 0416  BP: (!) 151/91 (!) 167/95 (!) 149/78 (!) 146/85  Pulse: 83 85 84 78  Resp:   15 14  Temp:   98 F (36.7 C) 98 F (36.7 C)  TempSrc:   Oral Oral  SpO2: 95% 93% 93% 92%  Weight:      Height:        Constitutional: Elderly male currently in no acute distress Eyes: PERRL, lids and conjunctivae normal ENMT: Mucous membranes are moist.  NGT currently in place to suction Neck: normal, supple, no masses, no thyromegaly Respiratory: clear to auscultation bilaterally, no wheezing, no crackles. Normal respiratory effort.   Cardiovascular: Regular rate and rhythm, no murmurs / rubs / gallops. No extremity edema. 2+ pedal pulses. No carotid bruits.  Abdomen: Protuberant male with decreased bowel  sounds. Musculoskeletal: no clubbing / cyanosis. No joint deformity upper and lower extremities. Good ROM, no contractures. Normal muscle tone.  Skin: no rashes, lesions, ulcers. No induration Neurologic: CN 2-12 grossly intact. Sensation intact, DTR normal. Strength 5/5 in all 4.  Psychiatric: Normal judgment and insight. Alert and oriented x 3. Normal mood.   Data Reviewed:   EKG revealed normal sinus rhythm with no signs of T wave inversions noted on previous EKG tracing.  Reviewed labs, imaging, and pertinent records as noted above in HPI. Assessment and Plan:  Small bowel obstruction Acute.  Patient presents with complaints of severe periumbilical abdominal pain, but had been having symptoms since 06/2022.  CT abdomen and pelvis noted mid small bowel obstruction with a single-point of transition within the periumbilical region.   General surgery had been consulted and NG tube placed to suction -Admit to a MedSurg bed -N.p.o. -Continue nasogastric tube to suction -Lactated ringers at 75 mL/h -Antiemetics as needed -Pain medications as needed -Appreciate general surgery consultative services  Paroxysmal atrial fibrillation Patient appears to be in sinus rhythm at this time.  CHA2DS2-VASc score = 3.  He reports being on Xarelto. -Hold Xarelto -Heparin drip per pharmacy  Hypertension -Hydralazine IV as needed for elevated blood pressures  History of DVT Patient with a remote history of DVT of the right leg.  GERD Hiatal hernia s/p Nissen fundoplication -Pharmacy substitution of Protonix IV for Nexium  Inguinal hernias CT scan of the abdomen pelvis noted small bilateral fat-containing inguinal hernias. -Continue outpatient follow-up with Dr. Lovell Sheehan in the outpatient setting scheduled for May 4  OSA Unable to continue CPAP at night while NG tube in place.   -Consider starting  CPAP once NG tube able to be removed.  Obesity  BMI 36.49 kg/m DVT prophylaxis: Heparin  drip Advance Care Planning:   Code Status: Full Code \  Consults: General surgery  Family Communication: No family request to be updated at this time  Severity of Illness: The appropriate patient status for this patient is INPATIENT. Inpatient status is judged to be reasonable and necessary in order to provide the required intensity of service to ensure the patient's safety. The patient's presenting symptoms, physical exam findings, and initial radiographic and laboratory data in the context of their chronic comorbidities is felt to place them at high risk for further clinical deterioration. Furthermore, it is not anticipated that the patient will be medically stable for discharge from the hospital within 2 midnights of admission.   * I certify that at the point of admission it is my clinical judgment that the patient will require inpatient hospital care spanning beyond 2 midnights from the point of admission due to high intensity of service, high risk for further deterioration and high frequency of surveillance required.*  Author: Clydie Braun, MD 11/18/2022 7:10 AM  For on call review [email or web address].

## 2022-11-18 NOTE — Progress Notes (Signed)
   11/18/22 1100  Mobility  Activity Ambulated with assistance in room  Level of Assistance Modified independent, requires aide device or extra time  Assistive Device  (IV Pole)  Distance Ambulated (ft) 40 ft  Activity Response Tolerated well  Mobility Referral Yes  $Mobility charge 1 Mobility   Mobility Specialist Progress Note  Pt was in chair and agreeable. Had c/o abd pain. Left in chair w/ all needs met and call bell in reach.   Anastasia Pall Mobility Specialist  Please contact via SecureChat or Rehab office at [phone]

## 2022-11-18 NOTE — Consult Note (Signed)
Surgical Evaluation Requesting provider: Ernie Avena  Chief Complaint: Abdominal pain  HPI: 72 year old prior patient of Dr. Daphine Deutscher who has a history of GERD status post Nissen fundoplication, chronic diarrhea and abdominal pain (he states since November of last year), bilateral fat-containing inguinal hernias (status post prior robotic repair in 2017), history of cholecystectomy, history of gastritis, who presented to the med center at [facility] with acute on chronic periumbilical abdominal pain.   This has been intermittent in nature with associated chronic diarrhea.  He has undergone a workup for his pain including upper and lower endoscopy, CT scan, has been treated with Bentyl with minimal improvement, and last saw his GI doctor (Dr. Marletta Lor) on April 3.  As a preliminary diagnosis of IBS with diarrhea.  And currently considering Xifaxan or pancreatic enzyme replacement. He is experiencing a burning-like discomfort throughout his abdomen and has had decreased oral intake with severe worsening of his pain in the last 24 hours.  No flatus, not sure when his last bowel movement was.  He does not vomit due to his history of Nissen.  Allergies  Allergen Reactions   Adhesive [Tape] Other (See Comments)    Causes blisters - pls use paper tape   Other Other (See Comments)    "Adhesives"Pt allergic to some tapes--pt request that we only use paper tape!!    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   Cataracts, both eyes    worst on left   Colon polyps    Complication of anesthesia    pt had spinal for a knee replacement--"woke up" during surgery--and  sick after surgery   Depression    Diverticulosis    DVT (deep venous thrombosis) 2001 or 2002   right leg-required hospitalization x 8 days   DVT (deep venous thrombosis) 1996   right   Glaucoma    both eyes   H/O hiatal hernia    Hearing impaired person, bilateral    bilateral   Hemorrhoid    History of IBS    History of kidney stones    x 1-passed   Hyperlipidemia 2012   Hypertension    Lumbago    Lumbar post-laminectomy syndrome    Myocardial infarction December 2019   PAF (paroxysmal atrial fibrillation)    PONV (postoperative nausea and vomiting)    Sleep apnea    pt uses cpap - setting of 16   Ulcer 1966   patient denies   Ulcerative colitis    patient denies   Wears glasses    Wears hearing aid     Past Surgical History:  Procedure Laterality Date   APPENDECTOMY  1993   BACK SURGERY  2008   bone spur removed    BALLOON DILATION N/A 06/18/2022   Procedure: BALLOON DILATION;  Surgeon: Lanelle Bal, DO;  [facility]: [facility];  Service: Endoscopy;  Laterality: N/A;   bilateral carpal tunnel release 2009     BIOPSY  06/18/2022   Procedure: BIOPSY;  Surgeon: Lanelle Bal, DO;  Location: [facility];  Service: Endoscopy;;  cervical fusion 2011--pt has good range of motion neck     CHOLECYSTECTOMY  1994   COLONOSCOPY WITH PROPOFOL N/A 06/18/2022   Procedure: COLONOSCOPY WITH PROPOFOL;  Surgeon: Lanelle Bal, DO;  [facility]: [facility];  Service: Endoscopy;  Laterality: N/A;  8:15 AM   ESOPHAGOGASTRODUODENOSCOPY (EGD) WITH PROPOFOL N/A 06/18/2022   Procedure: ESOPHAGOGASTRODUODENOSCOPY (EGD) WITH PROPOFOL;  Surgeon: Lanelle Bal, DO;  [facility]: [facility];  Service: Endoscopy;  Laterality: N/A;   EYE SURGERY N/A    Phreesia 07/13/2020   HERNIA REPAIR  2008   INSERTION OF MESH Bilateral 07/31/2016   Procedure: INSERTION OF MESH;  Surgeon: Luretha Murphy, MD;  Location: [facility];  Service: General;  Laterality: Bilateral;   JOINT  REPLACEMENT Bilateral    2001 left total knee and 1985 right total knee   LUMBAR LAMINECTOMY/DECOMPRESSION MICRODISCECTOMY N/A 07/24/2021   Procedure: DECOMPRESSION AND REMOVAL OF CYST LUMBAR FIVE THROUGH SACRAL ONE;  Surgeon: Venita Lick, MD;  Location: [facility];  Service: Orthopedics;  Laterality: N/A;   MASS EXCISION Left 01/04/2014   Procedure: LEFT MIDDLE FINGER MASS EXCISION WITH NAILBED REPAIR AND ADVANCEMENT FLAP;  Surgeon: Dominica Severin, MD;  Location: [facility];  Service: Orthopedics;  Laterality: Left;   mass removed from 2nd finger Left 03/2013   squamous carcinoma   nissen fundoplication 1995     POLYPECTOMY  06/18/2022   Procedure: POLYPECTOMY;  Surgeon: Lanelle Bal, DO;  [facility]: [facility];  Service: Endoscopy;;   PROSTATE SURGERY  2014   TURP   right wrist fusion 12/12 - pt wearing brace on the wrist-not started physical therapy yet Right    fusion prevent limited ROM to right wrist   SPINE SURGERY  2009   cervical spine fusion   thyroid nodule     removed   TOTAL KNEE REVISION Right 11/17/2013   Procedure: RIGHT KNEE POLYETHYLENE REVISION, bone graft;  Surgeon: Loanne Drilling, MD;  Location: [facility];  Service: Orthopedics;  Laterality: Right;   TOTAL SHOULDER ARTHROPLASTY Right 01/28/2018   Procedure: RIGHT TOTAL SHOULDER ARTHROPLASTY;  Surgeon: Beverely Low, MD;  Location: [facility];  Service: Orthopedics;  Laterality: Right;   TOTAL SHOULDER ARTHROPLASTY Left 07/15/2018   Procedure: LEFT SHOULDER ANATOMIC TOTAL SHOULDER ARTHROPLASTY;  Surgeon: Beverely Low, MD;  Location: [facility] OR;  Service: Orthopedics;  Laterality: Left;   TRANSURETHRAL RESECTION OF PROSTATE  10/09/2011   Procedure: TRANSURETHRAL RESECTION OF THE PROSTATE WITH GYRUS INSTRUMENTS;  Surgeon: Antony Haste, MD;  Location: [facility];  Service: Urology;  Laterality: N/A;   uvvvp     XI ROBOTIC ASSISTED INGUINAL HERNIA REPAIR WITH MESH Bilateral 07/31/2016   Procedure: XI  ROBOTIC BILATERAL INGUINAL HERNIA REPAIR WITH MESH;  Surgeon: Luretha Murphy, MD;  Location: [facility];  Service: General;  Laterality: Bilateral;    Family History  Problem Relation Age of Onset   Arthritis Mother    Hearing loss Mother    Hyperlipidemia Mother    Hypertension Mother    Deep vein thrombosis Mother    Bone cancer Father        deceased   Cancer Father    Diabetes Father    Prostate cancer Father    Ulcerative colitis Father    Diabetes Sister    Hearing loss Maternal Grandmother    Hearing loss Maternal Grandfather    Diabetes Paternal Grandfather     Social History   Socioeconomic History   Marital status: Married  Spouse name: Erskine Squibb   Number of children: 1   Years of education: Not on file   Highest education level: Not on file  Occupational History   Occupation: disabled  Tobacco Use   Smoking status: Former    Packs/day: 1.00    Years: 15.00    Additional pack years: 0.00    Total pack years: 15.00    Types: Cigarettes    Quit date: 08/04/1983    Years since quitting: 39.3   Smokeless tobacco: Never   Tobacco comments:    quit smoking 1985  Vaping Use   Vaping Use: Never used  Substance and Sexual Activity   Alcohol use: Yes    Alcohol/week: 3.0 standard drinks of alcohol    Types: 3 Cans of beer per week    Comment: 4 beers a week   Drug use: No   Sexual activity: Not Currently  Other Topics Concern   Not on file  Social History Narrative   Married x 3 years in 2021.   1 daughter, 1 grand son and 1 grand daughter   Social Determinants of Health   Financial Resource Strain: Low Risk  (08/04/2022)   Overall Financial Resource Strain (CARDIA)    Difficulty of Paying Living Expenses: Not hard at all  Food Insecurity: No Food Insecurity (11/17/2022)   Hunger Vital Sign    Worried About Running Out of Food in the Last Year: Never true    Ran Out of Food in the Last Year: Never true  Transportation Needs: No Transportation Needs (11/17/2022)    PRAPARE - Administrator, Civil Service (Medical): No    Lack of Transportation (Non-Medical): No  Physical Activity: Sufficiently Active (08/04/2022)   Exercise Vital Sign    Days of Exercise per Week: 7 days    Minutes of Exercise per Session: 30 min  Stress: No Stress Concern Present (08/04/2022)   [employer] of Occupational Health - Occupational Stress Questionnaire    Feeling of Stress : Not at all  Social Connections: Moderately Integrated (08/04/2022)   Social Connection and Isolation Panel [NHANES]    Frequency of Communication with Friends and Family: Twice a week    Frequency of Social Gatherings with Friends and Family: Once a week    Attends Religious Services: Never    Database administrator or Organizations: No    Attends Engineer, structural: 1 to 4 times per year    Marital Status: Married    No current facility-administered medications on file prior to encounter.   Current Outpatient Medications on File Prior to Encounter  Medication Sig Dispense Refill   acetaminophen (TYLENOL) 500 MG tablet Take 1,000 mg by mouth every 6 (six) hours as needed for moderate pain.     albuterol (VENTOLIN HFA) 108 (90 Base) MCG/ACT inhaler Inhale 2 puffs into the lungs every 4 (four) hours as needed for wheezing or shortness of breath. 18 g 2   atorvastatin (LIPITOR) 40 MG tablet TAKE 1 TABLET(40 MG) BY MOUTH DAILY 90 tablet 3   Calcium Carbonate-Vitamin D (CALCIUM + D PO) Take 1 tablet by mouth at bedtime.      dicyclomine (BENTYL) 10 MG capsule Take 1 capsule (10 mg total) by mouth 4 (four) times daily -  before meals and at bedtime. 120 capsule 11   esomeprazole (NEXIUM) 20 MG capsule Take 1 capsule (20 mg total) by mouth daily at 12 noon. 30 capsule 11   fentaNYL (DURAGESIC) 12 MCG/HR  Place 1 patch onto the skin every 7 (seven) days.     fluticasone (FLONASE) 50 MCG/ACT nasal spray Place 1 spray into both nostrils at bedtime as needed for allergies.      hydroxypropyl methylcellulose / hypromellose (ISOPTO TEARS / GONIOVISC) 2.5 % ophthalmic solution Place 1 drop into both eyes 3 (three) times daily as needed for dry eyes.     latanoprost (XALATAN) 0.005 % ophthalmic solution Place 1 drop into both eyes at bedtime.     levocetirizine (XYZAL) 5 MG tablet TAKE 1 TABLET BY MOUTH EVERY EVENING 90 tablet 1   meclizine (ANTIVERT) 25 MG tablet Take 1 tablet (25 mg total) by mouth 3 (three) times daily as needed for dizziness. 30 tablet 0   metoprolol tartrate (LOPRESSOR) 25 MG tablet Take 25 mg by mouth as needed (when in afib).     Multiple Vitamin (MULTIVITAMIN WITH MINERALS) TABS tablet Take 1 tablet by mouth daily.     PRESCRIPTION MEDICATION Inhale into the lungs at bedtime. CPAP     sodium chloride (OCEAN) 0.65 % SOLN nasal spray Place 1 spray into both nostrils as needed for congestion. 88 mL 0   traZODone (DESYREL) 50 MG tablet Take 100 mg by mouth at bedtime.     vortioxetine HBr (TRINTELLIX) 10 MG TABS tablet Take 1 tablet (10 mg total) by mouth at bedtime. 30 tablet 5   XARELTO 20 MG TABS tablet TAKE 1 TABLET BY MOUTH DAILY WITH SUPPER 90 tablet 1    Review of Systems: a complete, 10pt review of systems was completed with pertinent positives and negatives as documented in the HPI  Physical Exam: Vitals:   11/17/22 2230 11/17/22 2302  BP: (!) 167/95 (!) 149/78  Pulse: 85 84  Resp:  15  Temp:  98 F (36.7 C)  SpO2: 93% 93%   Gen: A&Ox3, no distress  Eyes: lids and conjunctivae normal, no icterus. Pupils equally round and reactive to light.  Neck: supple without mass or thyromegaly Chest: respiratory effort is normal. No crepitus or tenderness on palpation of the chest. Breath sounds equal.  Cardiovascular: RRR with palpable distal pulses, no pedal edema Gastrointestinal: soft, mildly distended, tender in the periumbilical and right mid abdomen without peritoneal signs.  No mass, hepatomegaly or splenomegaly.  NG tube in place with  light output. Muscoloskeletal: no clubbing or cyanosis of the fingers.  Strength is symmetrical throughout.  Range of motion of bilateral upper and lower extremities normal without pain, crepitation or contracture. Neuro: cranial nerves grossly intact.  Sensation intact to light touch diffusely. Psych: appropriate mood and affect, normal insight/judgment intact  Skin: warm and dry      Latest Ref Rng & Units 11/17/2022    3:20 PM 09/18/2022    2:31 PM 05/21/2022    3:18 PM  CBC  WBC 4.0 - 10.5 K/uL 9.7  7.0  7.4   Hemoglobin 13.0 - 17.0 g/dL 16.1  09.6  04.5   Hematocrit 39.0 - 52.0 % 47.6  46.0  45.9   Platelets 150 - 400 K/uL 182  168  195        Latest Ref Rng & Units 11/17/2022    3:20 PM 09/18/2022    2:31 PM 05/21/2022    3:18 PM  CMP  Glucose 70 - 99 mg/dL 409  811  914   BUN 8 - 23 mg/dL 13  14  14    Creatinine 0.61 - 1.24 mg/dL 7.82  9.56  2.13  Sodium 135 - 145 mmol/L 139  138  142   Potassium 3.5 - 5.1 mmol/L 4.0  4.3  4.1   Chloride 98 - 111 mmol/L 100  104  104   CO2 22 - 32 mmol/L 25  25  28    Calcium 8.9 - 10.3 mg/dL 16.1  9.4  9.7   Total Protein 6.5 - 8.1 g/dL 7.7  7.7  7.4   Total Bilirubin 0.3 - 1.2 mg/dL 1.0  0.7  0.6   Alkaline Phos 38 - 126 U/L 72  80    AST 15 - 41 U/L 28  28  26    ALT 0 - 44 U/L 29  30  24      Lab Results  Component Value Date   INR 0.98 11/09/2013    Imaging: DG Abd Portable 1 View  Result Date: 11/17/2022 CLINICAL DATA:  NG tube placement EXAM: PORTABLE ABDOMEN - 1 VIEW COMPARISON:  CT abdomen and pelvis 11/17/2022 FINDINGS: Enteric tube tip and side-port in the stomach. Bowel obstruction better demonstrated on same day CT abdomen and pelvis. IMPRESSION: Enteric tube tip and side-port in the stomach. Electronically Signed   By: Minerva Fester M.D.   On: 11/17/2022 20:16   CT ABDOMEN PELVIS W CONTRAST  Result Date: 11/17/2022 CLINICAL DATA:  Acute on chronic abdominal pain. Abdominal distension, known abdominal hernia. EXAM:  CT ABDOMEN AND PELVIS WITH CONTRAST TECHNIQUE: Multidetector CT imaging of the abdomen and pelvis was performed using the standard protocol following bolus administration of intravenous contrast. RADIATION DOSE REDUCTION: This exam was performed according to the departmental dose-optimization program which includes automated exposure control, adjustment of the mA and/or kV according to patient size and/or use of iterative reconstruction technique. CONTRAST:  85mL OMNIPAQUE IOHEXOL 300 MG/ML  SOLN COMPARISON:  None Available. FINDINGS: Lower chest: No acute abnormality. Hepatobiliary: Mild hepatic steatosis. No enhancing intrahepatic mass. No intra or extrahepatic biliary ductal dilation. Status post cholecystectomy. Pancreas: Unremarkable Spleen: Unremarkable Adrenals/Urinary Tract: The adrenal glands are unremarkable. The kidneys are normal in size and position. Stable cortical hypodensities within the lower pole the left kidney which are too small to accurately characterize but likely represent small cortical cysts. No follow-up imaging is recommended for these lesions. The kidneys are otherwise unremarkable. The bladder is unremarkable. Stomach/Bowel: A mid small bowel obstruction is identified with a single point of transition seen within the periumbilical region at axial image # 61/2. The proximal bowel is dilated fluid-filled and the distal small bowel decompressed. No associated mass is identified and the settings are most suggestive of an underlying adhesion. Mild sigmoid diverticulosis. The stomach, small bowel, and large bowel are otherwise unremarkable. Status post appendectomy. No free intraperitoneal gas or fluid. Vascular/Lymphatic: Aortic atherosclerosis. No enlarged abdominal or pelvic lymph nodes. Reproductive: Moderate prostatic hypertrophy. Other: Small bilateral fat containing inguinal hernias are present. Musculoskeletal: Degenerative changes are seen within the lumbar spine. No acute bone  abnormality. No lytic or blastic bone lesion. IMPRESSION: 1. Mid small bowel obstruction with a single point of transition within the periumbilical region. 2. Mild hepatic steatosis. 3. Mild sigmoid diverticulosis. 4. Moderate prostatic hypertrophy. 5. Small bilateral fat containing inguinal hernias. 6. Aortic atherosclerosis. Aortic Atherosclerosis (ICD10-I70.0). Electronically Signed   By: Helyn Numbers M.D.   On: 11/17/2022 19:13       A/P: 72 year old male with partial small bowel obstruction.  He has a fairly clear transition point which corresponds directly to his area of acute on chronic intermittent pain that he  has been having for several months now.  His diarrhea may have been overflow diarrhea related to a chronic partial obstruction.  NG tube decompression for tonight, fluid resuscitation, pain and nausea control; will discuss SBO protocol versus diagnostic laparoscopy with daytime surgeon in the morning.    Patient Active Problem List   Diagnosis Date Noted   SBO (small bowel obstruction) 11/17/2022   Acute otitis media 10/01/2022   Supraclavicular lymphadenopathy 10/01/2022   Encounter for well adult exam with abnormal findings 07/20/2022   BPPV (benign paroxysmal positional vertigo) 07/20/2022   Abnormal urine odor 07/20/2022   Epigastric burning sensation 05/21/2022   Left lower quadrant abdominal pain 05/21/2022   Dysphagia 05/21/2022   Loose stools 05/21/2022   Bloating 05/21/2022   Depression, recurrent 04/28/2022   Chronic musculoskeletal pain 04/28/2022   Preventative health care 04/28/2022   Insomnia 09/15/2021   Lumbar spinal stenosis 07/24/2021   Nonocclusive coronary atherosclerosis of native coronary artery 10/08/2019   Former smoker 09/26/2019   Cough 09/28/2018   Nasal congestion 09/28/2018   Paroxysmal atrial fibrillation 08/25/2018   Cellulitis 07/18/2018   Essential hypertension 07/18/2018   Hyperlipidemia 07/18/2018   Allergic reaction 07/18/2018    Atrial fibrillation with RVR 07/18/2018   H/O total shoulder replacement, left 07/15/2018   S/P shoulder replacement, right 01/28/2018   Robotic XI TAPP  bilateral inguinal hernia repair Dec 2017 07/31/2016   S/P bilateral inguinal herniorrhaphy 07/31/2016   Lumbar post-laminectomy syndrome    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

## 2022-11-18 NOTE — Progress Notes (Signed)
ANTICOAGULATION CONSULT NOTE  Pharmacy Consult for Heparin Indication: atrial fibrillation and hx VTE  Allergies  Allergen Reactions   Latex Rash and Other (See Comments)    CONDOM CATHETER- SEVERE REACTION!!!!!   Adhesive [Tape] Other (See Comments)    Causes blisters - pls use paper tape    Patient Measurements: Height:  (170.2 cm) Weight: 105.7 kg (233 lb) IBW/kg (Calculated) : 66.1 Heparin Dosing Weight: 90 kg  Vital Signs: Temp: 98.1 F (36.7 C) (04/17 1610) Temp Source: Oral (04/17 1610) BP: 161/93 (04/17 1610) Pulse Rate: 79 (04/17 1610)  Labs: Recent Labs    11/17/22 1520 11/18/22 1819  HGB 16.9  --   HCT 47.6  --   PLT 182  --   APTT  --  64*  HEPARINUNFRC  --  0.55  CREATININE 1.05  --      Estimated Creatinine Clearance: 72.6 mL/min (by C-G formula based on SCr of 1.05 mg/dL).   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   Cataracts, both eyes    worst on left   Colon polyps    Complication of anesthesia    pt had spinal for a knee replacement--"woke up" during surgery--and sick after surgery   Depression    Diverticulosis    DVT (deep venous thrombosis) 2001 or 2002   right leg-required hospitalization x 8 days   DVT (deep venous thrombosis) 1996   right   GERD (gastroesophageal reflux disease) 11/18/2022   Glaucoma    both eyes   H/O hiatal hernia    Hearing impaired person, bilateral    bilateral   Hemorrhoid    History of IBS    History of kidney stones    x 1-passed   Hyperlipidemia 2012   Hypertension    Lumbago    Lumbar post-laminectomy syndrome    Myocardial  infarction December 2019   PAF (paroxysmal atrial fibrillation)    PONV (postoperative nausea and vomiting)    Sleep apnea    pt uses cpap - setting of 16   Ulcer 1966   patient denies   Ulcerative colitis    patient denies   Wears glasses    Wears hearing aid     Assessment: 72 YO male with medical history significant for atrial fibrillation and hx of VTE on Xarelto  QD PTA who is admitted with complaints for abdominal pain now with concerns for SBO. Pharmacy consulted to manage heparin while Xarelto is on hold. Last dose Xarelto 4/15. Anticipate heparin level to be falsely elevated due to recent Xarelto use. Will use aPTT to adjust heparin.   Heparin level therapeutic at 0.55, aPTT slightly subtherapeutic at 64 seconds. Levels not quite correlating so will continue with aPTT dosing for now.   Goal of Therapy:  Heparin level 0.3-0.7 units/ml aPTT 66-102 seconds Monitor platelets by anticoagulation protocol: Yes   Plan:  Increase heparin drip slightly to 1400 units/hr Check aPTT in 8 hours Daily heparin level and CBC F/u ability to transition back to home Xarelto  Rexford Maus, PharmD, BCPS  11/18/2022 7:47 PM

## 2022-11-18 NOTE — Progress Notes (Signed)
  Transition of Care [facility]) Screening Note   Patient Details  Name: [name] Date of Birth: January 26, 1951   Transition of Care [facility]) CM/SW Contact:    Kermit Balo, RN Phone Number: 11/18/2022, 3:33 PM   Pt is from home with spouse admitted with SBO. Pt has NG to LIWS. Surgery following. Transition of Care Department [facility]) has reviewed patient. We will continue to monitor patient advancement through interdisciplinary progression rounds. If new patient transition needs arise, please place a TOC consult.

## 2022-11-19 ENCOUNTER — Inpatient Hospital Stay (HOSPITAL_COMMUNITY): Payer: Medicare HMO

## 2022-11-19 ENCOUNTER — Inpatient Hospital Stay (HOSPITAL_COMMUNITY): Payer: Medicare HMO | Admitting: Certified Registered Nurse Anesthetist

## 2022-11-19 ENCOUNTER — Encounter (HOSPITAL_COMMUNITY)
Admission: EM | Disposition: A | Discharge status: Home / Self Care | Payer: Self-pay | Source: Home / Self Care | Attending: Internal Medicine

## 2022-11-19 ENCOUNTER — Other Ambulatory Visit: Payer: Self-pay

## 2022-11-19 ENCOUNTER — Encounter (HOSPITAL_COMMUNITY): Payer: Self-pay | Admitting: Internal Medicine

## 2022-11-19 DIAGNOSIS — K565 Intestinal adhesions [bands], unspecified as to partial versus complete obstruction: Secondary | ICD-10-CM

## 2022-11-19 DIAGNOSIS — I10 Essential (primary) hypertension: Secondary | ICD-10-CM

## 2022-11-19 DIAGNOSIS — Z87891 Personal history of nicotine dependence: Secondary | ICD-10-CM | POA: Diagnosis not present

## 2022-11-19 DIAGNOSIS — I2510 Atherosclerotic heart disease of native coronary artery without angina pectoris: Secondary | ICD-10-CM

## 2022-11-19 DIAGNOSIS — K56609 Unspecified intestinal obstruction, unspecified as to partial versus complete obstruction: Secondary | ICD-10-CM | POA: Diagnosis not present

## 2022-11-19 HISTORY — PX: LYSIS OF ADHESION: SHX5961

## 2022-11-19 HISTORY — PX: LAPAROSCOPY: SHX197

## 2022-11-19 LAB — CBC
HCT: 48 % (ref 39.0–52.0)
Hemoglobin: 16.7 g/dL (ref 13.0–17.0)
MCH: 31.7 pg (ref 26.0–34.0)
MCHC: 34.8 g/dL (ref 30.0–36.0)
MCV: 91.3 fL (ref 80.0–100.0)
Platelets: 188 10*3/uL (ref 150–400)
RBC: 5.26 MIL/uL (ref 4.22–5.81)
RDW: 12.8 % (ref 11.5–15.5)
WBC: 12.3 10*3/uL — ABNORMAL HIGH (ref 4.0–10.5)
nRBC: 0 % (ref 0.0–0.2)

## 2022-11-19 LAB — TYPE AND SCREEN
ABO/RH(D): O POS
Antibody Screen: NEGATIVE

## 2022-11-19 LAB — C DIFFICILE QUICK SCREEN W PCR REFLEX
C Diff antigen: NEGATIVE
C Diff interpretation: NOT DETECTED
C Diff toxin: NEGATIVE

## 2022-11-19 LAB — APTT: aPTT: 62 seconds — ABNORMAL HIGH (ref 24–36)

## 2022-11-19 LAB — BASIC METABOLIC PANEL
Anion gap: 9 (ref 5–15)
BUN: 19 mg/dL (ref 8–23)
CO2: 29 mmol/L (ref 22–32)
Calcium: 9.3 mg/dL (ref 8.9–10.3)
Chloride: 102 mmol/L (ref 98–111)
Creatinine, Ser: 0.99 mg/dL (ref 0.61–1.24)
GFR, Estimated: >60 mL/min (ref >60)
Glucose, Bld: 134 mg/dL — ABNORMAL HIGH (ref 70–99)
Potassium: 3.9 mmol/L (ref 3.5–5.1)
Sodium: 140 mmol/L (ref 135–145)

## 2022-11-19 LAB — HEPARIN LEVEL (UNFRACTIONATED): Heparin Unfractionated: 0.4 IU/mL (ref 0.30–0.70)

## 2022-11-19 SURGERY — LAPAROSCOPY, DIAGNOSTIC
Anesthesia: General | Site: Abdomen

## 2022-11-19 MED ORDER — SUGAMMADEX SODIUM 200 MG/2ML IV SOLN
INTRAVENOUS | Status: DC | PRN
  Administered 2022-11-19: 200 mg via INTRAVENOUS

## 2022-11-19 MED ORDER — PROPOFOL 10 MG/ML IV BOLUS
INTRAVENOUS | Status: AC
  Filled 2022-11-19: qty 20

## 2022-11-19 MED ORDER — ORAL CARE MOUTH RINSE: 15.0000 mL | Freq: Once | OROMUCOSAL | Status: AC

## 2022-11-19 MED ORDER — DEXAMETHASONE SODIUM PHOSPHATE 10 MG/ML IJ SOLN
INTRAMUSCULAR | Status: AC
  Filled 2022-11-19: qty 1

## 2022-11-19 MED ORDER — METOPROLOL TARTRATE 5 MG/5ML IV SOLN: 5.0000 mg | Freq: Four times a day (QID) | INTRAVENOUS | Status: DC | PRN

## 2022-11-19 MED ORDER — ACETAMINOPHEN 10 MG/ML IV SOLN
INTRAVENOUS | Status: DC | PRN
  Administered 2022-11-19: 1000 mg via INTRAVENOUS

## 2022-11-19 MED ORDER — CEFAZOLIN SODIUM-DEXTROSE 2-4 GM/100ML-% IV SOLN
2.0000 g | Freq: Once | INTRAVENOUS | Status: AC
  Administered 2022-11-19: 2 g via INTRAVENOUS
  Filled 2022-11-19: qty 100

## 2022-11-19 MED ORDER — FENTANYL CITRATE (PF) 100 MCG/2ML IJ SOLN
INTRAMUSCULAR | Status: DC | PRN
  Administered 2022-11-19: 100 ug via INTRAVENOUS
  Administered 2022-11-19: 50 ug via INTRAVENOUS

## 2022-11-19 MED ORDER — PANTOPRAZOLE SODIUM 40 MG IV SOLR
40.0000 mg | Freq: Two times a day (BID) | INTRAVENOUS | Status: DC
  Administered 2022-11-19 – 2022-11-20 (×2): 40 mg via INTRAVENOUS
  Filled 2022-11-19 (×2): qty 10

## 2022-11-19 MED ORDER — BUPIVACAINE-EPINEPHRINE (PF) 0.5% -1:200000 IJ SOLN
INTRAMUSCULAR | Status: AC
  Filled 2022-11-19: qty 30

## 2022-11-19 MED ORDER — LACTATED RINGERS IV SOLN: INTRAVENOUS | Status: DC

## 2022-11-19 MED ORDER — CHLORHEXIDINE GLUCONATE 0.12 % MT SOLN
15.0000 mL | Freq: Once | OROMUCOSAL | Status: AC
  Administered 2022-11-19: 15 mL via OROMUCOSAL

## 2022-11-19 MED ORDER — BUPIVACAINE-EPINEPHRINE (PF) 0.5% -1:200000 IJ SOLN
INTRAMUSCULAR | Status: DC | PRN
  Administered 2022-11-19: 20 mL

## 2022-11-19 MED ORDER — PROPOFOL 10 MG/ML IV BOLUS
INTRAVENOUS | Status: DC | PRN
  Administered 2022-11-19: 150 mg via INTRAVENOUS

## 2022-11-19 MED ORDER — 0.9 % SODIUM CHLORIDE (POUR BTL) OPTIME
TOPICAL | Status: DC | PRN
  Administered 2022-11-19: 1000 mL

## 2022-11-19 MED ORDER — ROCURONIUM BROMIDE 10 MG/ML (PF) SYRINGE
PREFILLED_SYRINGE | INTRAVENOUS | Status: DC | PRN
  Administered 2022-11-19: 60 mg via INTRAVENOUS

## 2022-11-19 MED ORDER — CEFAZOLIN SODIUM-DEXTROSE 2-3 GM-%(50ML) IV SOLR
INTRAVENOUS | Status: DC | PRN
  Administered 2022-11-19: 2 g via INTRAVENOUS

## 2022-11-19 MED ORDER — ACETAMINOPHEN 10 MG/ML IV SOLN
INTRAVENOUS | Status: AC
  Filled 2022-11-19: qty 100

## 2022-11-19 MED ORDER — FENTANYL CITRATE (PF) 250 MCG/5ML IJ SOLN
INTRAMUSCULAR | Status: AC
  Filled 2022-11-19: qty 5

## 2022-11-19 MED ORDER — DEXAMETHASONE SODIUM PHOSPHATE 10 MG/ML IJ SOLN
INTRAMUSCULAR | Status: DC | PRN
  Administered 2022-11-19: 10 mg via INTRAVENOUS

## 2022-11-19 MED ORDER — FENTANYL CITRATE (PF) 100 MCG/2ML IJ SOLN: 25.0000 ug | INTRAMUSCULAR | Status: DC | PRN

## 2022-11-19 MED ORDER — HYDROMORPHONE HCL 1 MG/ML IJ SOLN: 1.0000 mg | INTRAMUSCULAR | Status: DC | PRN

## 2022-11-19 MED ORDER — ONDANSETRON HCL 4 MG/2ML IJ SOLN: 4.0000 mg | Freq: Once | INTRAMUSCULAR | Status: DC | PRN

## 2022-11-19 MED ORDER — LIDOCAINE 2% (20 MG/ML) 5 ML SYRINGE
INTRAMUSCULAR | Status: AC
  Filled 2022-11-19: qty 5

## 2022-11-19 MED ORDER — ONDANSETRON HCL 4 MG/2ML IJ SOLN
INTRAMUSCULAR | Status: AC
  Filled 2022-11-19: qty 2

## 2022-11-19 MED ORDER — ONDANSETRON HCL 4 MG/2ML IJ SOLN
INTRAMUSCULAR | Status: DC | PRN
  Administered 2022-11-19: 4 mg via INTRAVENOUS

## 2022-11-19 MED ORDER — LIDOCAINE 2% (20 MG/ML) 5 ML SYRINGE
INTRAMUSCULAR | Status: DC | PRN
  Administered 2022-11-19: 100 mg via INTRAVENOUS

## 2022-11-19 MED ORDER — ROCURONIUM BROMIDE 10 MG/ML (PF) SYRINGE
PREFILLED_SYRINGE | INTRAVENOUS | Status: AC
  Filled 2022-11-19: qty 10

## 2022-11-19 SURGICAL SUPPLY — 36 items
ADH SKN CLS APL DERMABOND .7 (GAUZE/BANDAGES/DRESSINGS) ×2
APL PRP STRL LF DISP 70% ISPRP (MISCELLANEOUS) ×2
BAG COUNTER SPONGE SURGICOUNT (BAG) ×2 IMPLANT
BAG SPNG CNTER NS LX DISP (BAG) ×2
BLADE CLIPPER SURG (BLADE) IMPLANT
CANISTER SUCT 3000ML PPV (MISCELLANEOUS) ×2 IMPLANT
CHLORAPREP W/TINT 26 (MISCELLANEOUS) ×2 IMPLANT
COVER SURGICAL LIGHT HANDLE (MISCELLANEOUS) ×2 IMPLANT
DERMABOND ADVANCED .7 DNX12 (GAUZE/BANDAGES/DRESSINGS) ×2 IMPLANT
DRAPE WARM FLUID 44X44 (DRAPES) ×2 IMPLANT
ELECT REM PT RETURN 9FT ADLT (ELECTROSURGICAL) ×2
ELECTRODE REM PT RTRN 9FT ADLT (ELECTROSURGICAL) ×2 IMPLANT
GLOVE SURG SIGNA 7.5 PF LTX (GLOVE) ×2 IMPLANT
GOWN STRL REUS W/ TWL LRG LVL3 (GOWN DISPOSABLE) ×4 IMPLANT
GOWN STRL REUS W/ TWL XL LVL3 (GOWN DISPOSABLE) ×2 IMPLANT
GOWN STRL REUS W/TWL LRG LVL3 (GOWN DISPOSABLE) ×4
GOWN STRL REUS W/TWL XL LVL3 (GOWN DISPOSABLE) ×2
IRRIG SUCT STRYKERFLOW 2 WTIP (MISCELLANEOUS)
IRRIGATION SUCT STRKRFLW 2 WTP (MISCELLANEOUS) IMPLANT
KIT BASIN OR (CUSTOM PROCEDURE TRAY) ×2 IMPLANT
KIT TURNOVER KIT B (KITS) ×2 IMPLANT
NS IRRIG 1000ML POUR BTL (IV SOLUTION) ×4 IMPLANT
PAD ARMBOARD 7.5X6 YLW CONV (MISCELLANEOUS) ×2 IMPLANT
PENCIL SMOKE EVACUATOR (MISCELLANEOUS) ×2 IMPLANT
SCISSORS LAP 5X35 DISP (ENDOMECHANICALS) IMPLANT
SET TUBE SMOKE EVAC HIGH FLOW (TUBING) ×2 IMPLANT
SLEEVE Z-THREAD 5X100MM (TROCAR) ×2 IMPLANT
SPECIMEN JAR LARGE (MISCELLANEOUS) IMPLANT
SPONGE T-LAP 18X18 ~~LOC~~+RFID (SPONGE) IMPLANT
STAPLER VISISTAT 35W (STAPLE) ×2 IMPLANT
SUT MON AB 4-0 PC3 18 (SUTURE) ×2 IMPLANT
TOWEL GREEN STERILE (TOWEL DISPOSABLE) ×2 IMPLANT
TRAY LAPAROSCOPIC MC (CUSTOM PROCEDURE TRAY) ×2 IMPLANT
TROCAR Z-THREAD OPTICAL 5X100M (TROCAR) ×2 IMPLANT
WARMER LAPAROSCOPE (MISCELLANEOUS) ×2 IMPLANT
YANKAUER SUCT BULB TIP NO VENT (SUCTIONS) IMPLANT

## 2022-11-19 NOTE — Progress Notes (Signed)
NG removed approximately 1500. No s/s of distention/N/V. Will continue to monitor. Reva Bores 3:04 PM 11/19/22

## 2022-11-19 NOTE — Op Note (Signed)
LAPAROSCOPY DIAGNOSTIC, LAPAROSCOPIC LYSIS OF ADHESION  Procedure Note  [name] 11/19/2022   Pre-op Diagnosis: Small bowel obstruction     Post-op Diagnosis: same  Procedure(s): LAPAROSCOPY DIAGNOSTIC LAPAROSCOPIC LYSIS OF ADHESION  Surgeon(s): Abigail Miyamoto, MD  Anesthesia: General  Staff:  Circulator: Pietro Cassis, RN Scrub Person: Truddie Coco, RN; Carmela Rima Circulator Assistant: Jeronimo Greaves, RN  Estimated Blood Loss: Minimal  Indications: This is a 72 year old gentleman who presents with a high-grade partial small bowel obstruction.  He has had multiple abdominal procedures and has a transition zone right underneath the umbilicus in the small bowel.  He has been having intermittent symptoms since November of last year.  The decision was made to proceed to the operating room for diagnostic laparoscopy  Findings: The patient was found have a loop of small bowel densely adherent to the peritoneal surface just below the umbilicus.  A small bowel loop was able to be freed up laparoscopically and there was no obvious stricture to necessitate the need for resection  Procedure: The patient was brought to the operating identified as correct patient.  He was placed upon the operating table general anesthesia was induced.  His abdomen was then prepped and draped in usual sterile fashion.  I made a small incision in the patient's left upper quadrant with a scalpel and then used the 5 mm trocar and Optiview camera to slowly traverse all layers of the abdominal wall under direct vision and gain entrance in the peritoneal cavity.  I then evaluated the trocar entrance site and saw no evidence of bowel injury.  I then evaluated the abdomen and he had a loop of small bowel densely adherent to the peritoneal surface at an old scar just below the umbilicus.  I placed two 5 mm trocars in the patient's left mid and lower quadrant and then using the laparoscopic scissors  was able to free up the loop of small bowel from the abdominal wall.  I then evaluated the small bowel and saw no evidence of stricture.  I was able to milk small bowel contents through the area and saw no evidence of bowel injury.  There was also no obvious incisional hernia.  At this point, hemostasis peer to be achieved.  I again evaluated the bowel and saw no evidence of injury.  At this point all trocars were removed under direct vision and the abdomen was deflated.  All incisions were then anesthetized with Marcaine and closed with 4-0 Monocryl sutures.  Dermabond was then applied.  The patient tolerated the procedure well.  All the counts were correct at the end of the procedure.  The patient was then extubated in the operating room and taken in a stable condition to the recovery room.                         Abigail Miyamoto   Date: 11/19/2022  Time: 10:44 AM

## 2022-11-19 NOTE — Progress Notes (Signed)
Progress Note     Subjective: Pt reports pain is better but still present in mid abdomen and worse with palpation. He did have some bowel function. Discussed imaging and plan to proceed to OR today and patient is agreeable.   Objective: Vital signs in last 24 hours: Temp:  [98.1 F (36.7 C)-98.2 F (36.8 C)] 98.2 F (36.8 C) (04/18 0408) Pulse Rate:  [72-79] 72 (04/18 0408) Resp:  [16-18] 18 (04/18 0408) BP: (145-161)/(77-93) 145/77 (04/18 0408) SpO2:  [92 %-94 %] 94 % (04/18 0408) Last BM Date : 11/17/22  Intake/Output from previous day: 04/17 0701 - 04/18 0700 In: 154.8 [I.V.:124.8; NG/GT:30] Out: 500 [Emesis/NG output:500] Intake/Output this shift: No intake/output data recorded.  PE: General: pleasant, WD, elderly male who is laying in bed in NAD Heart: regular, rate, and rhythm.   Lungs: Respiratory effort nonlabored Abd: soft, TTP in midabdomen supraumbilically, mild distention, +BS, NGT with bilious drainage  Psych: A&Ox3 with an appropriate affect.    Lab Results:  Recent Labs    11/17/22 1520 11/19/22 0037  WBC 9.7 12.3*  HGB 16.9 16.7  HCT 47.6 48.0  PLT 182 188   BMET Recent Labs    11/17/22 1520 11/19/22 0037  NA 139 140  K 4.0 3.9  CL 100 102  CO2 25 29  GLUCOSE 114* 134*  BUN 13 19  CREATININE 1.05 0.99  CALCIUM 10.4* 9.3   PT/INR No results for input(s): "LABPROT", "INR" in the last 72 hours. CMP     Component Value Date/Time   NA 140 11/19/2022 0037   NA 141 03/09/2022 0949   K 3.9 11/19/2022 0037   CL 102 11/19/2022 0037   CO2 29 11/19/2022 0037   GLUCOSE 134 (H) 11/19/2022 0037   BUN 19 11/19/2022 0037   BUN 13 03/09/2022 0949   CREATININE 0.99 11/19/2022 0037   CREATININE 1.19 05/21/2022 1518   CALCIUM 9.3 11/19/2022 0037   PROT 7.7 11/17/2022 1520   PROT 6.9 03/09/2022 0949   ALBUMIN 5.1 (H) 11/17/2022 1520   ALBUMIN 4.5 03/09/2022 0949   AST 28 11/17/2022 1520   ALT 29 11/17/2022 1520   ALKPHOS 72 11/17/2022 1520    BILITOT 1.0 11/17/2022 1520   BILITOT 0.6 03/09/2022 0949   GFRNONAA >60 11/19/2022 0037   GFRNONAA 66 07/11/2020 0824   GFRAA 76 07/11/2020 0824   Lipase     Component Value Date/Time   LIPASE 22 11/17/2022 1520       Studies/Results: DG Abd Portable 1V-Small Bowel Obstruction Protocol-initial, 8 hr delay  Result Date: 11/18/2022 CLINICAL DATA:  8 hour image for SBO EXAM: PORTABLE ABDOMEN - 1 VIEW COMPARISON:  Radiograph 11/17/2022 and CT abdomen and pelvis 11/17/2022 FINDINGS: Redemonstrated dilated loops of small bowel measuring up to 4.5 cm in diameter. Enteric contrast is present in the stomach and likely within the small bowel in the left upper quadrant. No definite enteric contrast in the colon. Contrast in the bladder from CT abdomen and pelvis with IV contrast yesterday. IMPRESSION: Redemonstrated dilated loops of small bowel measuring up to 4.5 cm in diameter. No definite enteric contrast in the colon. Findings consistent with small-bowel obstruction. Electronically Signed   By: Minerva Fester M.D.   On: 11/18/2022 20:38   DG Abd Portable 1 View  Result Date: 11/17/2022 CLINICAL DATA:  NG tube placement EXAM: PORTABLE ABDOMEN - 1 VIEW COMPARISON:  CT abdomen and pelvis 11/17/2022 FINDINGS: Enteric tube tip and side-port in the stomach. Bowel obstruction  better demonstrated on same day CT abdomen and pelvis. IMPRESSION: Enteric tube tip and side-port in the stomach. Electronically Signed   By: Minerva Fester M.D.   On: 11/17/2022 20:16   CT ABDOMEN PELVIS W CONTRAST  Result Date: 11/17/2022 CLINICAL DATA:  Acute on chronic abdominal pain. Abdominal distension, known abdominal hernia. EXAM: CT ABDOMEN AND PELVIS WITH CONTRAST TECHNIQUE: Multidetector CT imaging of the abdomen and pelvis was performed using the standard protocol following bolus administration of intravenous contrast. RADIATION DOSE REDUCTION: This exam was performed according to the departmental  dose-optimization program which includes automated exposure control, adjustment of the mA and/or kV according to patient size and/or use of iterative reconstruction technique. CONTRAST:  85mL OMNIPAQUE IOHEXOL 300 MG/ML  SOLN COMPARISON:  None Available. FINDINGS: Lower chest: No acute abnormality. Hepatobiliary: Mild hepatic steatosis. No enhancing intrahepatic mass. No intra or extrahepatic biliary ductal dilation. Status post cholecystectomy. Pancreas: Unremarkable Spleen: Unremarkable Adrenals/Urinary Tract: The adrenal glands are unremarkable. The kidneys are normal in size and position. Stable cortical hypodensities within the lower pole the left kidney which are too small to accurately characterize but likely represent small cortical cysts. No follow-up imaging is recommended for these lesions. The kidneys are otherwise unremarkable. The bladder is unremarkable. Stomach/Bowel: A mid small bowel obstruction is identified with a single point of transition seen within the periumbilical region at axial image # 61/2. The proximal bowel is dilated fluid-filled and the distal small bowel decompressed. No associated mass is identified and the settings are most suggestive of an underlying adhesion. Mild sigmoid diverticulosis. The stomach, small bowel, and large bowel are otherwise unremarkable. Status post appendectomy. No free intraperitoneal gas or fluid. Vascular/Lymphatic: Aortic atherosclerosis. No enlarged abdominal or pelvic lymph nodes. Reproductive: Moderate prostatic hypertrophy. Other: Small bilateral fat containing inguinal hernias are present. Musculoskeletal: Degenerative changes are seen within the lumbar spine. No acute bone abnormality. No lytic or blastic bone lesion. IMPRESSION: 1. Mid small bowel obstruction with a single point of transition within the periumbilical region. 2. Mild hepatic steatosis. 3. Mild sigmoid diverticulosis. 4. Moderate prostatic hypertrophy. 5. Small bilateral fat  containing inguinal hernias. 6. Aortic atherosclerosis. Aortic Atherosclerosis (ICD10-I70.0). Electronically Signed   By: Helyn Numbers M.D.   On: 11/17/2022 19:13    Anti-infectives: Anti-infectives (From admission, onward)    Start     Dose/Rate Route Frequency Ordered Stop   11/19/22 0845  ceFAZolin (ANCEF) IVPB 2g/100 mL premix        2 g 200 mL/hr over 30 Minutes Intravenous  Once 11/19/22 0749          Assessment/Plan  SBO - NGT with 500 cc out, thin bilious - patient is having bowel function but films still show dilated loops of bowel - patient still having some mid abdominal pain over areas of dilated appearing bowel on films - to OR today for diagnostic laparoscopy, possible ex-lap, possible bowel resection  - hep gtt stopped at 0700 today   - discussed risks of anesthesia, bleeding, infection and injury to intraabdominal structures and patient willing to proceed  FEN: NPO, NGT to LIWS VTE: hep gtt held at 0700 ID: Ancef pre-op  LOS: 2 days   I reviewed hospitalist notes, last 24 h vitals and pain scores, last 48 h intake and output, last 24 h labs and trends, and last 24 h imaging results.  This care required high  level of medical decision making.    Juliet Rude, [facility] Surgery 11/19/2022, 7:56 AM  Please see Amion for pager number during day hours 7:00am-4:30pm

## 2022-11-19 NOTE — Progress Notes (Signed)
PROGRESS NOTE    [name]  EAV:[id number] DOB: 07-31-1951 DOA: 11/17/2022 PCP: Billie Lade, MD   Brief Narrative: 72 year old with past medical history significant for hypertension, hyperlipidemia, paroxysmal A-fib, CAD, history of remote DVT, diarrhea, IBS, hiatal hernia, GERD, status post Nissen fundoplication present complaining of periumbilical abdominal pain.  He has been having abdominal pain since November 2023.  Underwent evaluation by GI at that time with endoscopy and colonoscopy with finding consistent with 3 tubular adenoma and mild gastritis.  He also report diarrhea, diagnosed with IBS.  He is scheduled to be seen by Dr. Lovell Sheehan for bilateral inguinal hernia repair.  He presents with persistent abdominal pain, rib pain CT abdomen and pelvis show mid  small bowel obstruction with single point of transition within the periumbilical region and a small bilateral fat-containing inguinal  hernia.    Assessment & Plan:   Principal Problem:   SBO (small bowel obstruction) Active Problems:   Paroxysmal atrial fibrillation   Essential hypertension   History of DVT (deep vein thrombosis)   Status post Nissen fundoplication   GERD (gastroesophageal reflux disease)   Bilateral recurrent inguinal hernias   OSA (obstructive sleep apnea)   Obesity (BMI 30-39.9)   1-Small bowel obstruction: -Presents with acute on chronic abdominal pain, CT scan showed partial small bowel obstruction with transition point.  -Surgery consulted, patient underwent laparoscopic diagnostic with subsequent laparoscopy lysis of adhesions by Dr. Magnus Ivan on 4/18 -N.p.o., IV fluids IV Protonix   Paroxysmal A-fib: Holding Xarelto preop Patient takes metoprolol lower as needed for A-fib He was on heparin gtt. Holding heparin post op.   Hypertension: PRN Hydralazine.   History DVT: On Xarelto.   GERD Hiatal hernia s/p Nissen fundoplication  IV protonix.   Inguinal  hernia: Sx following.    Obesity, BMI 36 Needs life style modification.   OSA: Unable to use CPAP due to NG tube/    Estimated body mass index is 36.49 kg/m as calculated from the following:   Height as of this encounter: 5\' 7"  (1.702 m).   Weight as of this encounter: 105.7 kg.   DVT prophylaxis: SCD Code Status: Full code Family Communication: Wife at bedside.  Disposition Plan:  Status is: Inpatient Remains inpatient appropriate because: management of SBO    Consultants:  Dr Magnus Ivan.   Procedures:  Laparoscopy, Lysis of Adhesion.   Antimicrobials:    Subjective: He is alert, came from sx, has NGT in place.  Denies abdominal pain.   Objective: Vitals:   11/18/22 0731 11/18/22 1610 11/18/22 1948 11/19/22 0408  BP: (!) 140/84 (!) 161/93 (!) 152/86 (!) 145/77  Pulse: 77 79 74 72  Resp: 16 16 17 18   Temp: 98.1 F (36.7 C) 98.1 F (36.7 C) 98.2 F (36.8 C) 98.2 F (36.8 C)  TempSrc: Oral Oral Oral Oral  SpO2: 91% 94% 92% 94%  Weight:      Height:        Intake/Output Summary (Last 24 hours) at 11/19/2022 0752 Last data filed at 11/19/2022 0455 Gross per 24 hour  Intake 154.82 ml  Output 500 ml  Net -345.18 ml   Filed Weights   11/17/22 1518  Weight: 105.7 kg    Examination:  General exam: Appears calm and comfortable  Respiratory system: Clear to auscultation. Respiratory effort normal. Cardiovascular system: S1 & S2 heard, RRR. No JVD, murmurs, rubs, gallops or clicks. No pedal edema. Gastrointestinal system: BS present, soft, distended, 3 small incision with glue Central nervous  system: Alert and oriented.  Extremities: Symmetric 5 x 5 power.   Data Reviewed: I have personally reviewed following labs and imaging studies  CBC: Recent Labs  Lab 11/17/22 1520 11/19/22 0037  WBC 9.7 12.3*  HGB 16.9 16.7  HCT 47.6 48.0  MCV 88.6 91.3  PLT 182 188   Basic Metabolic Panel: Recent Labs  Lab 11/17/22 1520 11/19/22 0037  NA 139 140  K 4.0 3.9  CL 100 102   CO2 25 29  GLUCOSE 114* 134*  BUN 13 19  CREATININE 1.05 0.99  CALCIUM 10.4* 9.3   GFR: Estimated Creatinine Clearance: 77 mL/min (by C-G formula based on SCr of 0.99 mg/dL). Liver Function Tests: Recent Labs  Lab 11/17/22 1520  AST 28  ALT 29  ALKPHOS 72  BILITOT 1.0  PROT 7.7  ALBUMIN 5.1*   Recent Labs  Lab 11/17/22 1520  LIPASE 22   No results for input(s): "AMMONIA" in the last 168 hours. Coagulation Profile: No results for input(s): "INR", "PROTIME" in the last 168 hours. Cardiac Enzymes: No results for input(s): "CKTOTAL", "CKMB", "CKMBINDEX", "TROPONINI" in the last 168 hours. BNP (last 3 results) No results for input(s): "PROBNP" in the last 8760 hours. HbA1C: No results for input(s): "HGBA1C" in the last 72 hours. CBG: No results for input(s): "GLUCAP" in the last 168 hours. Lipid Profile: No results for input(s): "CHOL", "HDL", "LDLCALC", "TRIG", "CHOLHDL", "LDLDIRECT" in the last 72 hours. Thyroid Function Tests: No results for input(s): "TSH", "T4TOTAL", "FREET4", "T3FREE", "THYROIDAB" in the last 72 hours. Anemia Panel: No results for input(s): "VITAMINB12", "FOLATE", "FERRITIN", "TIBC", "IRON", "RETICCTPCT" in the last 72 hours. Sepsis Labs: Recent Labs  Lab 11/17/22 1718  LATICACIDVEN 1.2    No results found for this or any previous visit (from the past 240 hour(s)).       Radiology Studies: DG Abd Portable 1V-Small Bowel Obstruction Protocol-initial, 8 hr delay  Result Date: 11/18/2022 CLINICAL DATA:  8 hour image for SBO EXAM: PORTABLE ABDOMEN - 1 VIEW COMPARISON:  Radiograph 11/17/2022 and CT abdomen and pelvis 11/17/2022 FINDINGS: Redemonstrated dilated loops of small bowel measuring up to 4.5 cm in diameter. Enteric contrast is present in the stomach and likely within the small bowel in the left upper quadrant. No definite enteric contrast in the colon. Contrast in the bladder from CT abdomen and pelvis with IV contrast yesterday.  IMPRESSION: Redemonstrated dilated loops of small bowel measuring up to 4.5 cm in diameter. No definite enteric contrast in the colon. Findings consistent with small-bowel obstruction. Electronically Signed   By: Minerva Fester M.D.   On: 11/18/2022 20:38   DG Abd Portable 1 View  Result Date: 11/17/2022 CLINICAL DATA:  NG tube placement EXAM: PORTABLE ABDOMEN - 1 VIEW COMPARISON:  CT abdomen and pelvis 11/17/2022 FINDINGS: Enteric tube tip and side-port in the stomach. Bowel obstruction better demonstrated on same day CT abdomen and pelvis. IMPRESSION: Enteric tube tip and side-port in the stomach. Electronically Signed   By: Minerva Fester M.D.   On: 11/17/2022 20:16   CT ABDOMEN PELVIS W CONTRAST  Result Date: 11/17/2022 CLINICAL DATA:  Acute on chronic abdominal pain. Abdominal distension, known abdominal hernia. EXAM: CT ABDOMEN AND PELVIS WITH CONTRAST TECHNIQUE: Multidetector CT imaging of the abdomen and pelvis was performed using the standard protocol following bolus administration of intravenous contrast. RADIATION DOSE REDUCTION: This exam was performed according to the departmental dose-optimization program which includes automated exposure control, adjustment of the mA and/or kV according to  patient size and/or use of iterative reconstruction technique. CONTRAST:  85mL OMNIPAQUE IOHEXOL 300 MG/ML  SOLN COMPARISON:  None Available. FINDINGS: Lower chest: No acute abnormality. Hepatobiliary: Mild hepatic steatosis. No enhancing intrahepatic mass. No intra or extrahepatic biliary ductal dilation. Status post cholecystectomy. Pancreas: Unremarkable Spleen: Unremarkable Adrenals/Urinary Tract: The adrenal glands are unremarkable. The kidneys are normal in size and position. Stable cortical hypodensities within the lower pole the left kidney which are too small to accurately characterize but likely represent small cortical cysts. No follow-up imaging is recommended for these lesions. The kidneys are  otherwise unremarkable. The bladder is unremarkable. Stomach/Bowel: A mid small bowel obstruction is identified with a single point of transition seen within the periumbilical region at axial image # 61/2. The proximal bowel is dilated fluid-filled and the distal small bowel decompressed. No associated mass is identified and the settings are most suggestive of an underlying adhesion. Mild sigmoid diverticulosis. The stomach, small bowel, and large bowel are otherwise unremarkable. Status post appendectomy. No free intraperitoneal gas or fluid. Vascular/Lymphatic: Aortic atherosclerosis. No enlarged abdominal or pelvic lymph nodes. Reproductive: Moderate prostatic hypertrophy. Other: Small bilateral fat containing inguinal hernias are present. Musculoskeletal: Degenerative changes are seen within the lumbar spine. No acute bone abnormality. No lytic or blastic bone lesion. IMPRESSION: 1. Mid small bowel obstruction with a single point of transition within the periumbilical region. 2. Mild hepatic steatosis. 3. Mild sigmoid diverticulosis. 4. Moderate prostatic hypertrophy. 5. Small bilateral fat containing inguinal hernias. 6. Aortic atherosclerosis. Aortic Atherosclerosis (ICD10-I70.0). Electronically Signed   By: Helyn Numbers M.D.   On: 11/17/2022 19:13        Scheduled Meds:  pantoprazole (PROTONIX) IV  40 mg Intravenous Q24H   sodium chloride flush  3 mL Intravenous Q12H   Continuous Infusions:   ceFAZolin (ANCEF) IV     heparin 1,400 Units/hr (11/19/22 0120)     LOS: 2 days    Time spent: 35 Minutes     Janiyla Long A Kailen Name, MD [facility]   If 7PM-7AM, please contact night-coverage [email or web address]  11/19/2022, 7:52 AM

## 2022-11-19 NOTE — Anesthesia Procedure Notes (Signed)
Procedure Name: Intubation Date/Time: 11/19/2022 10:02 AM  Performed by: Lonia Mad, CRNAPre-anesthesia Checklist: Patient identified, Emergency Drugs available, Suction available and Patient being monitored Patient Re-evaluated:Patient Re-evaluated prior to induction Oxygen Delivery Method: Circle System Utilized Preoxygenation: Pre-oxygenation with 100% oxygen Induction Type: IV induction Ventilation: Mask ventilation without difficulty Laryngoscope Size: Mac, 4 and Glidescope Grade View: Grade I Tube type: Oral Tube size: 7.5 mm Number of attempts: 1 Airway Equipment and Method: Stylet Placement Confirmation: ETT inserted through vocal cords under direct vision, positive ETCO2 and breath sounds checked- equal and bilateral Secured at: 24 cm Tube secured with: Tape Dental Injury: Teeth and Oropharynx as per pre-operative assessment  Difficulty Due To: Difficulty was anticipated, Difficult Airway- due to reduced neck mobility, Difficult Airway- due to large tongue and Difficult Airway- due to limited oral opening

## 2022-11-19 NOTE — Progress Notes (Addendum)
ANTICOAGULATION CONSULT NOTE  Pharmacy Consult for Heparin Indication: atrial fibrillation and hx VTE  Allergies  Allergen Reactions   Latex Rash and Other (See Comments)    CONDOM CATHETER- SEVERE REACTION!!!!!   Adhesive [Tape] Other (See Comments)    Causes blisters - pls use paper tape    Patient Measurements: Height:  (170.2 cm) Weight: 105.7 kg (233 lb) IBW/kg (Calculated) : 66.1 Heparin Dosing Weight: 90 kg  Vital Signs: Temp: 98.4 F (36.9 C) (04/18 0818) Temp Source: Oral (04/18 0818) BP: 149/81 (04/18 0818) Pulse Rate: 80 (04/18 0818)  Labs: Recent Labs    11/17/22 1520 11/18/22 1819 11/19/22 0037  HGB 16.9  --  16.7  HCT 47.6  --  48.0  PLT 182  --  188  APTT  --  64* 62*  HEPARINUNFRC  --  0.55 0.40  CREATININE 1.05  --  0.99    Estimated Creatinine Clearance: 77 mL/min (by C-G formula based on SCr of 0.99 mg/dL).   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   Cataracts, both eyes    worst on left   Colon polyps    Complication of anesthesia    pt had spinal for a knee replacement--"woke up" during surgery--and sick after surgery   Depression    Diverticulosis    DVT (deep venous thrombosis) 2001 or 2002   right leg-required hospitalization x 8 days   DVT (deep venous thrombosis) 1996   right   GERD (gastroesophageal reflux disease) 11/18/2022   Glaucoma    both eyes   H/O hiatal hernia    Hearing impaired person, bilateral    bilateral   Hemorrhoid    History of IBS    History of kidney stones    x 1-passed   Hyperlipidemia 2012   Hypertension    Lumbago    Lumbar  post-laminectomy syndrome    Myocardial infarction December 2019   PAF (paroxysmal atrial fibrillation)    PONV (postoperative nausea and vomiting)    Sleep apnea    pt uses cpap - setting of 16   Ulcer 1966   patient denies   Ulcerative colitis    patient denies   Wears glasses    Wears hearing aid     Assessment: 72 YO male with medical history significant for atrial fibrillation and hx of VTE on Xarelto  QD PTA who is admitted with complaints for abdominal pain now with concerns for SBO. Pharmacy consulted to manage heparin while Xarelto is on hold. Last dose Xarelto 4/15. Anticipate heparin level to be falsely elevated due to recent Xarelto use. Will use aPTT to adjust heparin.   aPTT slightly subtherapeutic at 62 seconds. Levels not quite correlating so will continue with aPTT dosing for now. Heparin infusion now held for pending OR procedure. When resumed, re-initiate at 1500 units/hr.  Goal of Therapy:  Heparin level 0.3-0.7 units/ml aPTT 66-102 seconds Monitor platelets by anticoagulation protocol: Yes   Plan:  Stop heparin infusion, f/u resuming after procedure Check heparin level in 8 hours and daily  while on heparin once resumed Continue to monitor H&H and platelets  Thank you for allowing pharmacy to be a part of this patient's care.  Thelma Barge, PharmD Clinical Pharmacist

## 2022-11-19 NOTE — Progress Notes (Signed)
Fentanyl patch removed by Dr. Desmond Lope at 0930. Wasted in Immunologist. Witnessed by Shanda Bumps, RN

## 2022-11-19 NOTE — Transfer of Care (Signed)
Immediate Anesthesia Transfer of Care Note  Patient: [name]  Procedure(s) Performed: LAPAROSCOPY DIAGNOSTIC LAPAROSCOPIC LYSIS OF ADHESION (Abdomen)  Patient Location: PACU  Anesthesia Type:General  Level of Consciousness: awake, alert , oriented, and patient cooperative  Airway & Oxygen Therapy: Patient Spontanous Breathing and Patient connected to nasal cannula oxygen  Post-op Assessment: Report given to RN and Post -op Vital signs reviewed and stable  Post vital signs: Reviewed and stable  Last Vitals:  Vitals Value Taken Time  BP 139/75 11/19/22 1048  Temp    Pulse 77 11/19/22 1051  Resp 17 11/19/22 1051  SpO2 93 % 11/19/22 1051  Vitals shown include unvalidated device data.  Last Pain:  Vitals:   11/19/22 0924  TempSrc: Oral  PainSc: 5       Patients Stated Pain Goal: 0 (11/18/22 1710)  Complications:  Encounter Notable Events  Notable Event Outcome Phase Comment  Difficult to intubate - expected  Intraprocedure Filed from anesthesia note documentation.

## 2022-11-19 NOTE — Anesthesia Preprocedure Evaluation (Addendum)
Anesthesia Evaluation  Patient identified by MRN, date of birth, ID band Patient awake    Reviewed: Allergy & Precautions, NPO status , Patient's Chart, lab work & pertinent test results, reviewed documented beta blocker date and time   History of Anesthesia Complications (+) PONV and history of anesthetic complications  Airway Mallampati: III  TM Distance: >3 FB Neck ROM: Limited    Dental  (+) Teeth Intact, Dental Advisory Given   Pulmonary sleep apnea and Continuous Positive Airway Pressure Ventilation , former smoker   Pulmonary exam normal breath sounds clear to auscultation       Cardiovascular hypertension, Pt. on home beta blockers + CAD, + Past MI and + DVT  Normal cardiovascular exam+ dysrhythmias Atrial Fibrillation  Rhythm:Regular Rate:Normal     Neuro/Psych  PSYCHIATRIC DISORDERS  Depression    S/p ACDF     GI/Hepatic Neg liver ROS, hiatal hernia, PUD,GERD  Medicated,,Small bowel obstruction    Endo/Other  Obesity   Renal/GU negative Renal ROS     Musculoskeletal  (+) Arthritis ,    Abdominal   Peds  Hematology  (+) Blood dyscrasia (Xarelto)   Anesthesia Other Findings Day of surgery medications reviewed with the patient.  Reproductive/Obstetrics                             Anesthesia Physical Anesthesia Plan  ASA: 3  Anesthesia Plan: General   Post-op Pain Management: Ofirmev IV (intra-op)* and Toradol IV (intra-op)*   Induction: Intravenous, Rapid sequence and Cricoid pressure planned  PONV Risk Score and Plan: 3 and Dexamethasone and Ondansetron  Airway Management Planned: Oral ETT and Video Laryngoscope Planned  Additional Equipment:   Intra-op Plan:   Post-operative Plan: Possible Post-op intubation/ventilation  Informed Consent: I have reviewed the patients History and Physical, chart, labs and discussed the procedure including the risks, benefits and  alternatives for the proposed anesthesia with the patient or authorized representative who has indicated his/her understanding and acceptance.     Dental advisory given  Plan Discussed with: CRNA  Anesthesia Plan Comments:        Anesthesia Quick Evaluation

## 2022-11-20 ENCOUNTER — Encounter (HOSPITAL_COMMUNITY): Payer: Self-pay | Admitting: Surgery

## 2022-11-20 DIAGNOSIS — K56609 Unspecified intestinal obstruction, unspecified as to partial versus complete obstruction: Secondary | ICD-10-CM | POA: Diagnosis not present

## 2022-11-20 LAB — COMPREHENSIVE METABOLIC PANEL
ALT: 22 U/L (ref 0–44)
AST: 27 U/L (ref 15–41)
Albumin: 3.4 g/dL — ABNORMAL LOW (ref 3.5–5.0)
Alkaline Phosphatase: 58 U/L (ref 38–126)
Anion gap: 8 (ref 5–15)
BUN: 22 mg/dL (ref 8–23)
CO2: 28 mmol/L (ref 22–32)
Calcium: 8.9 mg/dL (ref 8.9–10.3)
Chloride: 103 mmol/L (ref 98–111)
Creatinine, Ser: 1 mg/dL (ref 0.61–1.24)
GFR, Estimated: >60 mL/min (ref >60)
Glucose, Bld: 110 mg/dL — ABNORMAL HIGH (ref 70–99)
Potassium: 3.8 mmol/L (ref 3.5–5.1)
Sodium: 139 mmol/L (ref 135–145)
Total Bilirubin: 1 mg/dL (ref 0.3–1.2)
Total Protein: 6.2 g/dL — ABNORMAL LOW (ref 6.5–8.1)

## 2022-11-20 LAB — CBC
HCT: 41.2 % (ref 39.0–52.0)
Hemoglobin: 14.9 g/dL (ref 13.0–17.0)
MCH: 32.1 pg (ref 26.0–34.0)
MCHC: 36.2 g/dL — ABNORMAL HIGH (ref 30.0–36.0)
MCV: 88.8 fL (ref 80.0–100.0)
Platelets: 157 10*3/uL (ref 150–400)
RBC: 4.64 MIL/uL (ref 4.22–5.81)
RDW: 12.6 % (ref 11.5–15.5)
WBC: 8.8 10*3/uL (ref 4.0–10.5)
nRBC: 0 % (ref 0.0–0.2)

## 2022-11-20 LAB — GLUCOSE, CAPILLARY: Glucose-Capillary: 108 mg/dL — ABNORMAL HIGH (ref 70–99)

## 2022-11-20 LAB — HEPARIN LEVEL (UNFRACTIONATED): Heparin Unfractionated: <0.1 IU/mL — ABNORMAL LOW (ref 0.30–0.70)

## 2022-11-20 LAB — APTT: aPTT: 29 seconds (ref 24–36)

## 2022-11-20 MED ORDER — HEPARIN (PORCINE) 25000 UT/250ML-% IV SOLN
1500.0000 [IU]/h | INTRAVENOUS | Status: DC
  Administered 2022-11-20: 1500 [IU]/h via INTRAVENOUS
  Filled 2022-11-20: qty 250

## 2022-11-20 MED ORDER — RIVAROXABAN 20 MG PO TABS
20.0000 mg | ORAL_TABLET | Freq: Every day | ORAL | Status: DC
  Administered 2022-11-20: 20 mg via ORAL
  Filled 2022-11-20: qty 1

## 2022-11-20 NOTE — Progress Notes (Signed)
ANTICOAGULATION CONSULT NOTE  Pharmacy Consult for Heparin >> Xarelto Indication: atrial fibrillation and hx VTE  Allergies  Allergen Reactions   Latex Rash and Other (See Comments)    CONDOM CATHETER- SEVERE REACTION!!!!!   Adhesive [Tape] Other (See Comments)    Causes blisters - pls use paper tape    Patient Measurements: Height:  (170.2 cm) Weight: 105.7 kg (233 lb) IBW/kg (Calculated) : 66.1 Heparin Dosing Weight: 90 kg  Vital Signs: Temp: 98.3 F (36.8 C) (04/19 0811) Temp Source: Oral (04/19 0811) BP: 134/76 (04/19 0811) Pulse Rate: 61 (04/19 0811)  Labs: Recent Labs    11/18/22 1819 11/19/22 0037 11/20/22 0148  HGB  --  16.7 14.9  HCT  --  48.0 41.2  PLT  --  188 157  APTT 64* 62* 29  HEPARINUNFRC 0.55 0.40 <0.10*  CREATININE  --  0.99 1.00     Estimated Creatinine Clearance: 76.2 mL/min (by C-G formula based on SCr of 1 mg/dL).   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   Cataracts, both eyes    worst on left   Colon polyps    Complication of anesthesia    pt had spinal for a knee replacement--"woke up" during surgery--and sick after surgery   Depression    Diverticulosis    DVT (deep venous thrombosis) 2001 or 2002   right leg-required hospitalization x 8 days   DVT (deep venous thrombosis) 1996   right   GERD (gastroesophageal reflux disease) 11/18/2022   Glaucoma    both eyes   H/O hiatal hernia    Hearing impaired person, bilateral    bilateral   Hemorrhoid    History of IBS    History of kidney stones    x 1-passed   Hyperlipidemia 2012   Hypertension    Lumbago     Lumbar post-laminectomy syndrome    Myocardial infarction December 2019   PAF (paroxysmal atrial fibrillation)    PONV (postoperative nausea and vomiting)    Sleep apnea    pt uses cpap - setting of 16   Ulcer 1966   patient denies   Ulcerative colitis    patient denies   Wears glasses    Wears hearing aid     Assessment: 72 YO male with medical history significant for atrial fibrillation and hx of VTE on Xarelto  QD PTA who is admitted with complaints for abdominal pain now with concerns for SBO. S/p laparoscopic LOA on 11/19/22. Ok to resume Xarelto 4/19 per surgery.  Goal of Therapy:  Monitor platelets by anticoagulation protocol: Yes   Plan:  Discontinue heparin drip at time of Xarelto administration Resume Xarelto  daily   Rexford Maus, PharmD, BCPS 11/20/2022 3:46 PM

## 2022-11-20 NOTE — Anesthesia Postprocedure Evaluation (Signed)
Anesthesia Post Note  Patient: [name]  Procedure(s) Performed: LAPAROSCOPY DIAGNOSTIC LAPAROSCOPIC LYSIS OF ADHESION (Abdomen)     Patient location during evaluation: PACU Anesthesia Type: General Level of consciousness: awake and alert Pain management: pain level controlled Vital Signs Assessment: post-procedure vital signs reviewed and stable Respiratory status: spontaneous breathing, nonlabored ventilation, respiratory function stable and patient connected to nasal cannula oxygen Cardiovascular status: blood pressure returned to baseline and stable Postop Assessment: no apparent nausea or vomiting Anesthetic complications: yes   Encounter Notable Events  Notable Event Outcome Phase Comment  Difficult to intubate - expected  Intraprocedure Filed from anesthesia note documentation.    Last Vitals:  Vitals:   11/19/22 1646 11/19/22 2010  BP: 127/82 135/77  Pulse: 67 70  Resp: 17   Temp: 36.4 C 36.7 C  SpO2: 93% 94%    Last Pain:  Vitals:   11/19/22 2200  TempSrc:   PainSc: 0-No pain                 Collene Schlichter

## 2022-11-20 NOTE — Progress Notes (Signed)
ANTICOAGULATION CONSULT NOTE  Pharmacy Consult for Heparin Indication: atrial fibrillation and hx VTE  Allergies  Allergen Reactions   Latex Rash and Other (See Comments)    CONDOM CATHETER- SEVERE REACTION!!!!!   Adhesive [Tape] Other (See Comments)    Causes blisters - pls use paper tape    Patient Measurements: Height:  (170.2 cm) Weight: 105.7 kg (233 lb) IBW/kg (Calculated) : 66.1 Heparin Dosing Weight: 90 kg  Vital Signs: Temp: 98.3 F (36.8 C) (04/19 0811) Temp Source: Oral (04/19 0811) BP: 134/76 (04/19 0811) Pulse Rate: 61 (04/19 0811)  Labs: Recent Labs    11/17/22 1520 11/18/22 1819 11/19/22 0037 11/20/22 0148  HGB 16.9  --  16.7 14.9  HCT 47.6  --  48.0 41.2  PLT 182  --  188 157  APTT  --  64* 62* 29  HEPARINUNFRC  --  0.55 0.40 <0.10*  CREATININE 1.05  --  0.99 1.00    Estimated Creatinine Clearance: 76.2 mL/min (by C-G formula based on SCr of 1 mg/dL).   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   Cataracts, both eyes    worst on left   Colon polyps    Complication of anesthesia    pt had spinal for a knee replacement--"woke up" during surgery--and sick after surgery   Depression    Diverticulosis    DVT (deep venous thrombosis) 2001 or 2002   right leg-required hospitalization x 8 days   DVT (deep venous thrombosis) 1996   right   GERD (gastroesophageal reflux disease) 11/18/2022   Glaucoma    both eyes   H/O hiatal hernia    Hearing impaired person, bilateral    bilateral   Hemorrhoid    History of IBS    History of kidney stones    x 1-passed   Hyperlipidemia 2012    Hypertension    Lumbago    Lumbar post-laminectomy syndrome    Myocardial infarction December 2019   PAF (paroxysmal atrial fibrillation)    PONV (postoperative nausea and vomiting)    Sleep apnea    pt uses cpap - setting of 16   Ulcer 1966   patient denies   Ulcerative colitis    patient denies   Wears glasses    Wears hearing aid     Assessment: 72 YO male with medical history significant for atrial fibrillation and hx of VTE on Xarelto  QD PTA who is admitted with complaints for abdominal pain now with concerns for SBO. Pharmacy consulted to manage heparin while Xarelto is on hold. Last dose Xarelto 4/15. Anticipate heparin level to be falsely elevated due to recent Xarelto use. Will use aPTT to adjust heparin.   aPTT slightly subtherapeutic at 62 seconds. Levels not quite correlating so will continue with aPTT dosing for now. Heparin infusion now held for pending OR procedure. When resumed, re-initiate at 1500 units/hr.  Goal of Therapy:  Heparin level 0.3-0.7 units/ml aPTT 66-102 seconds Monitor platelets by anticoagulation protocol: Yes   Plan:  Restart heparin infusion at 1500 units/hr (no  bolus due to recent procedure) Check heparin level in 8 hours and daily while on heparin Continue to monitor H&H and platelets    Thank you for allowing pharmacy to be a part of this patient's care.  Thelma Barge, PharmD Clinical Pharmacist

## 2022-11-20 NOTE — Discharge Instructions (Signed)

## 2022-11-20 NOTE — Progress Notes (Signed)
Patient evaluated this afternoon by Dr. Magnus Ivan and tolerating FLD.  He is felt to be medically stable for DC home from our standpoint.  Appropriate follow up has been placed.  He does not need any pain medications at home per his d/w Dr. Magnus Ivan.  Letha Cape 4:31 PM 11/20/2022

## 2022-11-20 NOTE — Progress Notes (Signed)
Pt discharged home with wife in stable condition after discharge education was provided, all questions addressed

## 2022-11-20 NOTE — Progress Notes (Signed)
Progress Note  1 Day Post-Op  Subjective: Pt reports overall feeling better but has some right sided cramping abdominal pain. He is passing flatus. Denies nausea or vomiting. He walked some last night but has not been up yet this AM.   Objective: Vital signs in last 24 hours: Temp:  [97.6 F (36.4 C)-99 F (37.2 C)] 98.3 F (36.8 C) (04/19 0811) Pulse Rate:  [61-80] 61 (04/19 0811) Resp:  [15-20] 17 (04/19 0811) BP: (127-150)/(71-89) 134/76 (04/19 0811) SpO2:  [92 %-95 %] 92 % (04/19 0811) Last BM Date : 11/19/22  Intake/Output from previous day: 04/18 0701 - 04/19 0700 In: 700 [I.V.:700] Out: 1000 [Urine:600; Emesis/NG output:400] Intake/Output this shift: No intake/output data recorded.  PE: General: pleasant, WD, elderly male who is laying in bed in NAD Heart: regular, rate, and rhythm.   Lungs: Respiratory effort nonlabored Abd: soft, appropriately ttp, ND, +BS, incisions C/D/I Psych: A&Ox3 with an appropriate affect.    Lab Results:  Recent Labs    11/19/22 0037 11/20/22 0148  WBC 12.3* 8.8  HGB 16.7 14.9  HCT 48.0 41.2  PLT 188 157    BMET Recent Labs    11/19/22 0037 11/20/22 0148  NA 140 139  K 3.9 3.8  CL 102 103  CO2 29 28  GLUCOSE 134* 110*  BUN 19 22  CREATININE 0.99 1.00  CALCIUM 9.3 8.9    PT/INR No results for input(s): "LABPROT", "INR" in the last 72 hours. CMP     Component Value Date/Time   NA 139 11/20/2022 0148   NA 141 03/09/2022 0949   K 3.8 11/20/2022 0148   CL 103 11/20/2022 0148   CO2 28 11/20/2022 0148   GLUCOSE 110 (H) 11/20/2022 0148   BUN 22 11/20/2022 0148   BUN 13 03/09/2022 0949   CREATININE 1.00 11/20/2022 0148   CREATININE 1.19 05/21/2022 1518   CALCIUM 8.9 11/20/2022 0148   PROT 6.2 (L) 11/20/2022 0148   PROT 6.9 03/09/2022 0949   ALBUMIN 3.4 (L) 11/20/2022 0148   ALBUMIN 4.5 03/09/2022 0949   AST 27 11/20/2022 0148   ALT 22 11/20/2022 0148   ALKPHOS 58 11/20/2022 0148   BILITOT 1.0 11/20/2022  0148   BILITOT 0.6 03/09/2022 0949   GFRNONAA >60 11/20/2022 0148   GFRNONAA 66 07/11/2020 0824   GFRAA 76 07/11/2020 0824   Lipase     Component Value Date/Time   LIPASE 22 11/17/2022 1520       Studies/Results: DG Abd Portable 1V-Small Bowel Obstruction Protocol-24 hr delay  Result Date: 11/19/2022 CLINICAL DATA:  [id number] SBO (small bowel obstruction) [id number] EXAM: PORTABLE ABDOMEN - 1 VIEW COMPARISON:  KUB, 11/18/2022.  CT AP, 11/17/2018. FINDINGS: Similar appearance of multiple air-and-fluid distended loops of small bowel, with representative loop measuring up to 5.6 cm in width. Nondilated colon. Multiple surgical clips at RIGHT hemi-pelvis. No free intraperitoneal air as can be seen on supine XRs. No interval osseous abnormality. IMPRESSION: Similar degree of small-bowel distention. Findings favored consistent with small-bowel obstruction. Electronically Signed   By: Roanna Banning M.D.   On: 11/19/2022 08:03   DG Abd Portable 1V-Small Bowel Obstruction Protocol-initial, 8 hr delay  Result Date: 11/18/2022 CLINICAL DATA:  8 hour image for SBO EXAM: PORTABLE ABDOMEN - 1 VIEW COMPARISON:  Radiograph 11/17/2022 and CT abdomen and pelvis 11/17/2022 FINDINGS: Redemonstrated dilated loops of small bowel measuring up to 4.5 cm in diameter. Enteric contrast is present in the stomach and likely within the small bowel  in the left upper quadrant. No definite enteric contrast in the colon. Contrast in the bladder from CT abdomen and pelvis with IV contrast yesterday. IMPRESSION: Redemonstrated dilated loops of small bowel measuring up to 4.5 cm in diameter. No definite enteric contrast in the colon. Findings consistent with small-bowel obstruction. Electronically Signed   By: Minerva Fester M.D.   On: 11/18/2022 20:38    Anti-infectives: Anti-infectives (From admission, onward)    Start     Dose/Rate Route Frequency Ordered Stop   11/19/22 0845  ceFAZolin (ANCEF) IVPB 2g/100 mL premix        2  g 200 mL/hr over 30 Minutes Intravenous  Once 11/19/22 0749 11/19/22 1356        Assessment/Plan  SBO POD1 s/p laparoscopic LOA - passing flatus and overall feeling better - ok to start FLD this AM, may advance to soft later today vs tomorrow depending on how patient doing clinically  - ok to restart heparin with NO BOLUS - encourage mobilization  - ok to shower from surgical standpoint   FEN: FLD, KVO VTE: hep gtt  ID: Ancef pre-op  LOS: 3 days   I reviewed hospitalist notes, last 24 h vitals and pain scores, last 48 h intake and output, and last 24 h labs and trends.   Juliet Rude, [facility] 11/20/2022, 9:02 AM Please see Amion for pager number during day hours 7:00am-4:30pm

## 2022-11-20 NOTE — Care Management Important Message (Signed)
Important Message  Patient Details  Name: [name] MRN: [id number] Date of Birth: Dec 18, 1950   Medicare Important Message Given:  Yes     Sherilyn Banker 11/20/2022, 12:44 PM

## 2022-11-20 NOTE — Discharge Summary (Signed)
Physician Discharge Summary   Patient: [name] MRN: [id number] DOB: 06-Mar-1951  Admit date:     11/17/2022  Discharge date: 11/20/22  Discharge Physician: Alba Cory   PCP: Billie Lade, MD   Recommendations at discharge:   Follow up with Sx  Discharge Diagnoses: Principal Problem:   SBO (small bowel obstruction) Active Problems:   Paroxysmal atrial fibrillation   Essential hypertension   History of DVT (deep vein thrombosis)   Status post Nissen fundoplication   GERD (gastroesophageal reflux disease)   Bilateral recurrent inguinal hernias   OSA (obstructive sleep apnea)   Obesity (BMI 30-39.9)  Resolved Problems:   * No resolved hospital problems. *  Hospital Course: 72 year old with past medical history significant for hypertension, hyperlipidemia, paroxysmal A-fib, CAD, history of remote DVT, diarrhea, IBS, hiatal hernia, GERD, status post Nissen fundoplication present complaining of periumbilical abdominal pain. He has been having abdominal pain since November 2023. Underwent evaluation by GI at that time with endoscopy and colonoscopy with finding consistent with 3 tubular adenoma and mild gastritis. He also report diarrhea, diagnosed with IBS. He is scheduled to be seen by Dr. Lovell Sheehan for bilateral inguinal hernia repair. He presents with persistent abdominal pain, rib pain CT abdomen and pelvis show mid small bowel obstruction with single point of transition within the periumbilical region and a small bilateral fat-containing inguinal hernia.   Assessment and Plan: 1-Small bowel obstruction: -Presents with acute on chronic abdominal pain, CT scan showed partial small bowel obstruction with transition point.  -Surgery consulted, patient underwent laparoscopic diagnostic with subsequent laparoscopy lysis of adhesions by Dr. Magnus Ivan on 4/18 - IV fluids IV Protonix -started on Full liquid diet today, ambulate.  Evaluated by sx this afternoon, ok to discharge  home   Paroxysmal A-fib: Holding Xarelto preop Patient takes metoprolol  as needed for A-fib On heparin, Gtt . O Ok to resume Xarelto   Hypertension: PRN Hydralazine.    History DVT: On Xarelto outpatient .    GERD Hiatal hernia s/p Nissen fundoplication  IV protonix.    Inguinal  hernia: Sx following.    Obesity, BMI 36 Needs life style modification.    OSA: Unable to use CPAP due to NG tube/      Estimated body mass index is 36.49 kg/m as calculated from the following:   Height as of this encounter: 5\' 7"  (1.702 m).   Weight as of this encounter: 105.7 kg.     DVT prophylaxis: SCD Code Status: Full code Family Communication: Wife at bedside 4/18.  Disposition Plan:  Status is: Inpatient Remains inpatient appropriate because: management of SBO            Consultants: Sx Procedures performed: Laparoscopy lysis of adhesion  Disposition: Home Diet recommendation:  Discharge Diet Orders (From admission, onward)     Start     Ordered   11/20/22 0000  Diet - low sodium heart healthy        11/20/22 1632           Diet;bland DISCHARGE MEDICATION: Allergies as of 11/20/2022       Reactions   Latex Rash, Other (See Comments)   CONDOM CATHETER- SEVERE REACTION!!!!!   Adhesive [tape] Other (See Comments)   Causes blisters - pls use paper tape        Medication List     STOP taking these medications    dicyclomine 10 MG capsule Commonly known as: BENTYL   fentaNYL 12 MCG/HR Commonly known  as: DURAGESIC   levocetirizine 5 MG tablet Commonly known as: XYZAL       TAKE these medications    acetaminophen 500 MG tablet Commonly known as: TYLENOL Take 500 mg by mouth every 6 (six) hours as needed for mild pain or headache.   albuterol 108 (90 Base) MCG/ACT inhaler Commonly known as: VENTOLIN HFA Inhale 2 puffs into the lungs every 4 (four) hours as needed for wheezing or shortness of breath.   atorvastatin 40 MG tablet Commonly  known as: LIPITOR TAKE 1 TABLET(40 MG) BY MOUTH DAILY What changed: See the new instructions.   CALCIUM + D3 PO Take 1 tablet by mouth at bedtime.   diphenhydrAMINE 25 mg capsule Commonly known as: BENADRYL Take 25 mg by mouth daily as needed for allergies.   esomeprazole 20 MG capsule Commonly known as: NexIUM Take 1 capsule (20 mg total) by mouth daily at 12 noon.   fluticasone 50 MCG/ACT nasal spray Commonly known as: FLONASE Place 1 spray into both nostrils at bedtime as needed for allergies.   hydroxypropyl methylcellulose / hypromellose 2.5 % ophthalmic solution Commonly known as: ISOPTO TEARS / GONIOVISC Place 1 drop into both eyes 3 (three) times daily as needed for dry eyes.   latanoprost 0.005 % ophthalmic solution Commonly known as: XALATAN Place 1 drop into both eyes at bedtime.   meclizine 25 MG tablet Commonly known as: ANTIVERT Take 1 tablet (25 mg total) by mouth 3 (three) times daily as needed for dizziness.   metoprolol tartrate 25 MG tablet Commonly known as: LOPRESSOR Take 25 mg by mouth See admin instructions. Take 25 mg by mouth one to three times a day when in A-Fib (call 9-1-1 if no relief after three doses)   multivitamin with minerals Tabs tablet Take 1 tablet by mouth at bedtime.   OVER THE COUNTER MEDICATION Take 1-2 tablets by mouth See admin instructions. Tylenol for Tension headaches- Take 1-2 tablets by mouth every 6 hours as needed for headaches   PRESCRIPTION MEDICATION Inhale into the lungs See admin instructions. CPAP- At bedtime   sodium chloride 0.65 % Soln nasal spray Commonly known as: OCEAN Place 1 spray into both nostrils as needed for congestion.   traZODone 50 MG tablet Commonly known as: DESYREL Take 100 mg by mouth at bedtime.   vortioxetine HBr 10 MG Tabs tablet Commonly known as: Trintellix Take 1 tablet (10 mg total) by mouth at bedtime.   Xarelto 20 MG Tabs tablet Generic drug: rivaroxaban TAKE 1 TABLET BY  MOUTH DAILY WITH SUPPER What changed:  how much to take when to take this        Follow-up Information     Maczis, Hedda Slade, PA-C. Call in 3 week(s).   Specialty: General Surgery Why: We are scheduling a follow up appointment in 3-4 weeks, please call to confirm appointment date/time. Please arrive 30 min prior to appointment time to check in. Contact information: [address] [facility] [place] [address] [phone]                Discharge Exam: Filed Weights   11/17/22 1518  Weight: 105.7 kg   General NAD  Condition at discharge: stable  The results of significant diagnostics from this hospitalization (including imaging, microbiology, ancillary and laboratory) are listed below for reference.   Imaging Studies: DG Abd Portable 1V-Small Bowel Obstruction Protocol-24 hr delay  Result Date: 11/19/2022 CLINICAL DATA:  [id number] SBO (small bowel obstruction) [id number]  EXAM: PORTABLE ABDOMEN - 1 VIEW COMPARISON:  KUB, 11/18/2022.  CT AP, 11/17/2018. FINDINGS: Similar appearance of multiple air-and-fluid distended loops of small bowel, with representative loop measuring up to 5.6 cm in width. Nondilated colon. Multiple surgical clips at RIGHT hemi-pelvis. No free intraperitoneal air as can be seen on supine XRs. No interval osseous abnormality. IMPRESSION: Similar degree of small-bowel distention. Findings favored consistent with small-bowel obstruction. Electronically Signed   By: Roanna Banning M.D.   On: 11/19/2022 08:03   DG Abd Portable 1V-Small Bowel Obstruction Protocol-initial, 8 hr delay  Result Date: 11/18/2022 CLINICAL DATA:  8 hour image for SBO EXAM: PORTABLE ABDOMEN - 1 VIEW COMPARISON:  Radiograph 11/17/2022 and CT abdomen and pelvis 11/17/2022 FINDINGS: Redemonstrated dilated loops of small bowel measuring up to 4.5 cm in diameter. Enteric contrast is present in the stomach and likely within the small bowel in the left upper  quadrant. No definite enteric contrast in the colon. Contrast in the bladder from CT abdomen and pelvis with IV contrast yesterday. IMPRESSION: Redemonstrated dilated loops of small bowel measuring up to 4.5 cm in diameter. No definite enteric contrast in the colon. Findings consistent with small-bowel obstruction. Electronically Signed   By: Minerva Fester M.D.   On: 11/18/2022 20:38   DG Abd Portable 1 View  Result Date: 11/17/2022 CLINICAL DATA:  NG tube placement EXAM: PORTABLE ABDOMEN - 1 VIEW COMPARISON:  CT abdomen and pelvis 11/17/2022 FINDINGS: Enteric tube tip and side-port in the stomach. Bowel obstruction better demonstrated on same day CT abdomen and pelvis. IMPRESSION: Enteric tube tip and side-port in the stomach. Electronically Signed   By: Minerva Fester M.D.   On: 11/17/2022 20:16   CT ABDOMEN PELVIS W CONTRAST  Result Date: 11/17/2022 CLINICAL DATA:  Acute on chronic abdominal pain. Abdominal distension, known abdominal hernia. EXAM: CT ABDOMEN AND PELVIS WITH CONTRAST TECHNIQUE: Multidetector CT imaging of the abdomen and pelvis was performed using the standard protocol following bolus administration of intravenous contrast. RADIATION DOSE REDUCTION: This exam was performed according to the departmental dose-optimization program which includes automated exposure control, adjustment of the mA and/or kV according to patient size and/or use of iterative reconstruction technique. CONTRAST:  85mL OMNIPAQUE IOHEXOL 300 MG/ML  SOLN COMPARISON:  None Available. FINDINGS: Lower chest: No acute abnormality. Hepatobiliary: Mild hepatic steatosis. No enhancing intrahepatic mass. No intra or extrahepatic biliary ductal dilation. Status post cholecystectomy. Pancreas: Unremarkable Spleen: Unremarkable Adrenals/Urinary Tract: The adrenal glands are unremarkable. The kidneys are normal in size and position. Stable cortical hypodensities within the lower pole the left kidney which are too small to  accurately characterize but likely represent small cortical cysts. No follow-up imaging is recommended for these lesions. The kidneys are otherwise unremarkable. The bladder is unremarkable. Stomach/Bowel: A mid small bowel obstruction is identified with a single point of transition seen within the periumbilical region at axial image # 61/2. The proximal bowel is dilated fluid-filled and the distal small bowel decompressed. No associated mass is identified and the settings are most suggestive of an underlying adhesion. Mild sigmoid diverticulosis. The stomach, small bowel, and large bowel are otherwise unremarkable. Status post appendectomy. No free intraperitoneal gas or fluid. Vascular/Lymphatic: Aortic atherosclerosis. No enlarged abdominal or pelvic lymph nodes. Reproductive: Moderate prostatic hypertrophy. Other: Small bilateral fat containing inguinal hernias are present. Musculoskeletal: Degenerative changes are seen within the lumbar spine. No acute bone abnormality. No lytic or blastic bone lesion. IMPRESSION: 1. Mid small bowel obstruction with a single point of transition  within the periumbilical region. 2. Mild hepatic steatosis. 3. Mild sigmoid diverticulosis. 4. Moderate prostatic hypertrophy. 5. Small bilateral fat containing inguinal hernias. 6. Aortic atherosclerosis. Aortic Atherosclerosis (ICD10-I70.0). Electronically Signed   By: Helyn Numbers M.D.   On: 11/17/2022 19:13    Microbiology: Results for orders placed or performed during the hospital encounter of 11/17/22  C Difficile Quick Screen w PCR reflex     Status: None   Collection Time: 11/19/22 12:48 PM   Specimen: STOOL  Result Value Ref Range Status   C Diff antigen NEGATIVE NEGATIVE Final   C Diff toxin NEGATIVE NEGATIVE Final   C Diff interpretation No C. difficile detected.  Final    Comment: Performed at [facility], [address] [place], [place] [address]    Labs: CBC: Recent Labs  Lab 11/17/22 1520  11/19/22 0037 11/20/22 0148  WBC 9.7 12.3* 8.8  HGB 16.9 16.7 14.9  HCT 47.6 48.0 41.2  MCV 88.6 91.3 88.8  PLT 182 188 157   Basic Metabolic Panel: Recent Labs  Lab 11/17/22 1520 11/19/22 0037 11/20/22 0148  NA 139 140 139  K 4.0 3.9 3.8  CL 100 102 103  CO2 GLUCOSE 114* 134* 110*  BUN CREATININE 1.05 0.99 1.00  CALCIUM 10.4* 9.3 8.9   Liver Function Tests: Recent Labs  Lab 11/17/22 1520 11/20/22 0148  AST 28 27  ALT 29 22  ALKPHOS 72 58  BILITOT 1.0 1.0  PROT 7.7 6.2*  ALBUMIN 5.1* 3.4*   CBG: Recent Labs  Lab 11/20/22 0057  GLUCAP 108*    Discharge time spent: greater than 30 minutes.  Signed: Alba Cory, MD [facility] 11/20/2022

## 2022-11-20 NOTE — Progress Notes (Addendum)
PROGRESS NOTE    [name]  ZOX:[id number] DOB: 06-17-1951 DOA: 11/17/2022 PCP: Billie Lade, MD   Brief Narrative: 72 year old with past medical history significant for hypertension, hyperlipidemia, paroxysmal A-fib, CAD, history of remote DVT, diarrhea, IBS, hiatal hernia, GERD, status post Nissen fundoplication present complaining of periumbilical abdominal pain.  He has been having abdominal pain since November 2023.  Underwent evaluation by GI at that time with endoscopy and colonoscopy with finding consistent with 3 tubular adenoma and mild gastritis.  He also report diarrhea, diagnosed with IBS.  He is scheduled to be seen by Dr. Lovell Sheehan for bilateral inguinal hernia repair.  He presents with persistent abdominal pain, rib pain CT abdomen and pelvis show mid  small bowel obstruction with single point of transition within the periumbilical region and a small bilateral fat-containing inguinal  hernia.    Assessment & Plan:   Principal Problem:   SBO (small bowel obstruction) Active Problems:   Paroxysmal atrial fibrillation   Essential hypertension   History of DVT (deep vein thrombosis)   Status post Nissen fundoplication   GERD (gastroesophageal reflux disease)   Bilateral recurrent inguinal hernias   OSA (obstructive sleep apnea)   Obesity (BMI 30-39.9)   1-Small bowel obstruction: -Presents with acute on chronic abdominal pain, CT scan showed partial small bowel obstruction with transition point.  -Surgery consulted, patient underwent laparoscopic diagnostic with subsequent laparoscopy lysis of adhesions by Dr. Magnus Ivan on 4/18 - IV fluids IV Protonix -started on Full liquid diet today, ambulate.   Paroxysmal A-fib: Holding Xarelto preop Patient takes metoprolol  as needed for A-fib On heparin, Gtt . Resume Xarelto when ok by sx.  Per sx ok to resume Xarelto  Hypertension: PRN Hydralazine.   History DVT: On Xarelto outpatient .   GERD Hiatal hernia s/p  Nissen fundoplication  IV protonix.   Inguinal  hernia: Sx following.   Obesity, BMI 36 Needs life style modification.   OSA: Unable to use CPAP due to NG tube/    Estimated body mass index is 36.49 kg/m as calculated from the following:   Height as of this encounter:  (1.702 m).   Weight as of this encounter: 105.7 kg.   DVT prophylaxis: SCD Code Status: Full code Family Communication: Wife at bedside 4/18.  Disposition Plan:  Status is: Inpatient Remains inpatient appropriate because: management of SBO    Consultants:  Dr Magnus Ivan.   Procedures:  Laparoscopy, Lysis of Adhesion.   Antimicrobials:    Subjective: He is doing well, denies abdominal pain, would like to eat regular food, we discussed step for diet   Objective: Vitals:   11/19/22 1115 11/19/22 1133 11/19/22 1646 11/19/22 2010  BP: 134/71 128/75 127/82 135/77  Pulse: 74 76 67 70  Resp: 15  17   Temp:  98.6 F (37 C) 97.6 F (36.4 C) 98.1 F (36.7 C)  TempSrc:  Oral Oral Oral  SpO2: 93% 95% 93% 94%  Weight:      Height:        Intake/Output Summary (Last 24 hours) at 11/20/2022 0733 Last data filed at 11/20/2022 8119 Gross per 24 hour  Intake 700 ml  Output 1000 ml  Net -300 ml    Filed Weights   11/17/22 1518  Weight: 105.7 kg    Examination:  General exam: NAD Respiratory system: CTA Cardiovascular system: S 1, S 2 RRR Gastrointestinal system: BS present, soft, nt 3 small incision with glue Central nervous system: Alert, oriented Extremities:  no edema.   Data Reviewed: I have personally reviewed following labs and imaging studies  CBC: Recent Labs  Lab 11/17/22 1520 11/19/22 0037 11/20/22 0148  WBC 9.7 12.3* 8.8  HGB 16.9 16.7 14.9  HCT 47.6 48.0 41.2  MCV 88.6 91.3 88.8  PLT 182 188 157    Basic Metabolic Panel: Recent Labs  Lab 11/17/22 1520 11/19/22 0037 11/20/22 0148  NA 139 140 139  K 4.0 3.9 3.8  CL 100 102 103  CO2 GLUCOSE 114* 134*  110*  BUN CREATININE 1.05 0.99 1.00  CALCIUM 10.4* 9.3 8.9    GFR: Estimated Creatinine Clearance: 76.2 mL/min (by C-G formula based on SCr of 1 mg/dL). Liver Function Tests: Recent Labs  Lab 11/17/22 1520 11/20/22 0148  AST 28 27  ALT 29 22  ALKPHOS 72 58  BILITOT 1.0 1.0  PROT 7.7 6.2*  ALBUMIN 5.1* 3.4*    Recent Labs  Lab 11/17/22 1520  LIPASE 22    No results for input(s): "AMMONIA" in the last 168 hours. Coagulation Profile: No results for input(s): "INR", "PROTIME" in the last 168 hours. Cardiac Enzymes: No results for input(s): "CKTOTAL", "CKMB", "CKMBINDEX", "TROPONINI" in the last 168 hours. BNP (last 3 results) No results for input(s): "PROBNP" in the last 8760 hours. HbA1C: No results for input(s): "HGBA1C" in the last 72 hours. CBG: Recent Labs  Lab 11/20/22 0057  GLUCAP 108*   Lipid Profile: No results for input(s): "CHOL", "HDL", "LDLCALC", "TRIG", "CHOLHDL", "LDLDIRECT" in the last 72 hours. Thyroid Function Tests: No results for input(s): "TSH", "T4TOTAL", "FREET4", "T3FREE", "THYROIDAB" in the last 72 hours. Anemia Panel: No results for input(s): "VITAMINB12", "FOLATE", "FERRITIN", "TIBC", "IRON", "RETICCTPCT" in the last 72 hours. Sepsis Labs: Recent Labs  Lab 11/17/22 1718  LATICACIDVEN 1.2     Recent Results (from the past 240 hour(s))  C Difficile Quick Screen w PCR reflex     Status: None   Collection Time: 11/19/22 12:48 PM   Specimen: STOOL  Result Value Ref Range Status   C Diff antigen NEGATIVE NEGATIVE Final   C Diff toxin NEGATIVE NEGATIVE Final   C Diff interpretation No C. difficile detected.  Final    Comment: Performed at [facility], [address], [place], [place] [address]         Radiology Studies: DG Abd Portable 1V-Small Bowel Obstruction Protocol-24 hr delay  Result Date: 11/19/2022 CLINICAL DATA:  [id number] SBO (small bowel obstruction) [id number] EXAM: PORTABLE ABDOMEN - 1 VIEW COMPARISON:   KUB, 11/18/2022.  CT AP, 11/17/2018. FINDINGS: Similar appearance of multiple air-and-fluid distended loops of small bowel, with representative loop measuring up to 5.6 cm in width. Nondilated colon. Multiple surgical clips at RIGHT hemi-pelvis. No free intraperitoneal air as can be seen on supine XRs. No interval osseous abnormality. IMPRESSION: Similar degree of small-bowel distention. Findings favored consistent with small-bowel obstruction. Electronically Signed   By: Roanna Banning M.D.   On: 11/19/2022 08:03   DG Abd Portable 1V-Small Bowel Obstruction Protocol-initial, 8 hr delay  Result Date: 11/18/2022 CLINICAL DATA:  8 hour image for SBO EXAM: PORTABLE ABDOMEN - 1 VIEW COMPARISON:  Radiograph 11/17/2022 and CT abdomen and pelvis 11/17/2022 FINDINGS: Redemonstrated dilated loops of small bowel measuring up to 4.5 cm in diameter. Enteric contrast is present in the stomach and likely within the small bowel in the left upper quadrant. No definite enteric contrast in the colon. Contrast in the bladder  from CT abdomen and pelvis with IV contrast yesterday. IMPRESSION: Redemonstrated dilated loops of small bowel measuring up to 4.5 cm in diameter. No definite enteric contrast in the colon. Findings consistent with small-bowel obstruction. Electronically Signed   By: Minerva Fester M.D.   On: 11/18/2022 20:38        Scheduled Meds:  pantoprazole (PROTONIX) IV  40 mg Intravenous Q12H   sodium chloride flush  3 mL Intravenous Q12H   Continuous Infusions:  lactated ringers 75 mL/hr at 11/20/22 0452     LOS: 3 days    Time spent: 35 Minutes     Dereon Corkery A Macari Zalesky, MD [facility]   If 7PM-7AM, please contact night-coverage [email or web address]  11/20/2022, 7:33 AM

## 2022-11-23 ENCOUNTER — Telehealth: Payer: Self-pay | Admitting: *Deleted

## 2022-11-23 ENCOUNTER — Encounter: Payer: Self-pay | Admitting: *Deleted

## 2022-11-23 NOTE — Transitions of Care (Post Inpatient/ED Visit) (Signed)
   11/23/2022  Name: [name] MRN: [id number] DOB: 09-Jul-1951  Today's TOC FU Call Status: Today's TOC FU Call Status:: Successful TOC FU Call Competed TOC FU Call Complete Date: 11/23/22  Transition Care Management Follow-up Telephone Call Date of Discharge: 11/20/22 Discharge Facility: [facility]) Type of Discharge: Inpatient Admission Primary Inpatient Discharge Diagnosis:: Small Bowel Obstruction How have you been since you were released from the hospital?: Better Any questions or concerns?: No  Items Reviewed: Did you receive and understand the discharge instructions provided?: Yes Medications obtained and verified?: Yes (Medications Reviewed) Any new allergies since your discharge?: No Dietary orders reviewed?: Yes Type of Diet Ordered:: heart healthy, bland Do you have support at home?: Yes People in Home: spouse Name of Support/Comfort Primary Source: [facility] and Equipment/Supplies: Were Home Health Services Ordered?: No Any new equipment or medical supplies ordered?: No  Functional Questionnaire: Do you need assistance with bathing/showering or dressing?: No Do you need assistance with meal preparation?: No Do you need assistance with eating?: No Do you have difficulty maintaining continence: No Do you need assistance with getting out of bed/getting out of a chair/moving?: No Do you have difficulty managing or taking your medications?: No  Follow up appointments reviewed: PCP Follow-up appointment confirmed?: NA Specialist Hospital Follow-up appointment confirmed?: Yes Date of Specialist follow-up appointment?: 12/14/22 (Unsure of exact appt date at time right now b/c wife scheduled it, but it's in 3 weeks) Follow-Up Specialty Provider:: [facility]. Has initial visit with Dr Lovell Sheehan on 12/03/22 re: bil inguinal hernias. Patient is going to talk to him about the SBO as well. May be able to cancel appt at [facility] if Dr Lovell Sheehan will  manage it. Do you need transportation to your follow-up appointment?: No Do you understand care options if your condition(s) worsen?: Yes-patient verbalized understanding  SDOH Interventions Today    Flowsheet Row Most Recent Value  SDOH Interventions   Transportation Interventions Intervention Not Indicated  Financial Strain Interventions Intervention Not Indicated      Interventions Today    Flowsheet Row Most Recent Value  Chronic Disease   Chronic disease during today's visit Other  [Hospitalization for small bowel obstruction, Bilateral Inguinal Hernias]  General Interventions   General Interventions Discussed/Reviewed General Interventions Discussed, General Interventions Reviewed, Doctor Visits  Doctor Visits Discussed/Reviewed Doctor Visits Discussed, Doctor Visits Reviewed, PCP, Specialist  PCP/Specialist Visits Compliance with follow-up visit  Exercise Interventions   Exercise Discussed/Reviewed Physical Activity  Physical Activity Discussed/Reviewed Physical Activity Discussed, Physical Activity Reviewed  Education Interventions   Education Provided Provided Education  Provided Verbal Education On Medication, When to see the doctor, Mental Health/Coping with Illness, Other, Nutrition  [GasX for bloating/gas]  Nutrition Interventions   Nutrition Discussed/Reviewed Nutrition Discussed, Nutrition Reviewed, Decreasing salt  [heart healthy, bland]  Pharmacy Interventions   Pharmacy Dicussed/Reviewed Pharmacy Topics Discussed, Pharmacy Topics Reviewed, Medications and their functions  Safety Interventions   Safety Discussed/Reviewed Safety Discussed       TOC Interventions Today    Flowsheet Row Most Recent Value  TOC Interventions   TOC Interventions Discussed/Reviewed TOC Interventions Discussed, TOC Interventions Reviewed        Demetrios Loll, BSN, RN-BC RN Care Coordinator [facility]  [facility] Direct Dial: [phone] Main #: [phone]

## 2022-11-24 ENCOUNTER — Ambulatory Visit: Payer: Self-pay | Admitting: *Deleted

## 2022-11-24 ENCOUNTER — Other Ambulatory Visit: Payer: Self-pay | Admitting: Cardiology

## 2022-11-24 NOTE — Telephone Encounter (Signed)
Prescription refill request for Xarelto received.  Indication: PAF Last office visit: 09/03/22  Ernst Bowler MD Weight: 105.3kg Age: 72 Scr: 1.00 on 11/20/22  Epic CrCl: 97.99  Based on above findings Xarelto  daily is the appropriate dose.  Refill approved.

## 2022-11-24 NOTE — Chronic Care Management (AMB) (Signed)
   11/24/2022  PERCELL LAMBOY 01-Mar-1951 [id number]   Enrollment status changed to previously enrolled.  Irving Shows [facility], BSN RN Case Manager [facility] [phone]

## 2022-12-03 ENCOUNTER — Ambulatory Visit: Payer: Medicare HMO | Admitting: General Surgery

## 2022-12-03 ENCOUNTER — Encounter: Payer: Self-pay | Admitting: General Surgery

## 2022-12-03 ENCOUNTER — Other Ambulatory Visit: Payer: Self-pay

## 2022-12-03 VITALS — BP 110/73 | HR 58 | Temp 97.7°F | Resp 16 | Ht 67.0 in | Wt 221.0 lb

## 2022-12-03 DIAGNOSIS — K4020 Bilateral inguinal hernia, without obstruction or gangrene, not specified as recurrent: Secondary | ICD-10-CM

## 2022-12-04 NOTE — Progress Notes (Signed)
[name]; [id number]; 03-22-1951   HPI Patient is a 72 year old white male who was referred to my care by Dr. Durwin Nora for evaluation and treatment of recurrent bilateral inguinal hernias.  Patient states he was referred to my care as these hernias were found on CT scan of the abdomen.  He is status post robotic assisted bilateral inguinal herniorrhaphy with mesh by Dr. Luretha Murphy in [place] in 2017.  He denies any swelling in the inguinal regions.  He does not describe any specific pain in the groin region.  He may have some discomfort on occasion.  Of note was the fact that he just had a laparoscopic lysis of adhesions for a small bowel obstruction in [place] on 11/19/2022.  He is still recovering from this. 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   Cataracts, both eyes    worst on left   Colon polyps    Complication of anesthesia    pt had spinal for a knee replacement--"woke up" during surgery--and sick after surgery   Depression    Diverticulosis    DVT (deep venous thrombosis) (HCC) 2001 or 2002   right leg-required hospitalization x 8 days   DVT (deep venous thrombosis) (HCC) 1996   right   GERD (gastroesophageal reflux disease) 11/18/2022   Glaucoma    both eyes   H/O hiatal hernia    Hearing impaired person, bilateral    bilateral   Hemorrhoid    History of IBS    History of kidney stones    x 1-passed   Hyperlipidemia 2012   Hypertension    Lumbago    Lumbar post-laminectomy syndrome    Myocardial infarction [facility]) December 2019   PAF (paroxysmal atrial fibrillation) (HCC)    PONV (postoperative nausea and  vomiting)    Sleep apnea    pt uses cpap - setting of 16   Ulcer 1966   patient denies   Ulcerative colitis (HCC)    patient denies   Wears glasses    Wears hearing aid     Past Surgical History:  Procedure Laterality Date   APPENDECTOMY  1993   BACK SURGERY  2008   bone spur removed    BALLOON DILATION N/A 06/18/2022   Procedure: BALLOON DILATION;  Surgeon: Lanelle Bal, DO;  [facility];  Service: Endoscopy;  Laterality: N/A;   bilateral carpal tunnel release 2009     BIOPSY  06/18/2022   Procedure: BIOPSY;  Surgeon: Lanelle Bal, DO;  [facility];  Service: Endoscopy;;   cervical fusion 2011--pt has good range of motion neck     CHOLECYSTECTOMY  1994   COLONOSCOPY WITH PROPOFOL N/A 06/18/2022   Procedure: COLONOSCOPY WITH PROPOFOL;  Surgeon: Lanelle Bal, DO;  [facility];  Service: Endoscopy;  Laterality: N/A;  8:15 AM   ESOPHAGOGASTRODUODENOSCOPY (EGD) WITH PROPOFOL N/A 06/18/2022   Procedure: ESOPHAGOGASTRODUODENOSCOPY (EGD) WITH PROPOFOL;  Surgeon: Lanelle Bal, DO;  [facility];  Service: Endoscopy;  Laterality: N/A;   EYE SURGERY N/A  [employer] 07/13/2020   HERNIA REPAIR  2008   INSERTION OF MESH Bilateral 07/31/2016   Procedure: INSERTION OF MESH;  Surgeon: Luretha Murphy, MD;  Location: [facility];  Service: General;  Laterality: Bilateral;   JOINT REPLACEMENT Bilateral    2001 left total knee and 1985 right total knee   LAPAROSCOPY N/A 11/19/2022   Procedure: LAPAROSCOPY DIAGNOSTIC;  Surgeon: Abigail Miyamoto, MD;  [facility];  Service: General;  Laterality: N/A;   LUMBAR LAMINECTOMY/DECOMPRESSION MICRODISCECTOMY N/A 07/24/2021   Procedure: DECOMPRESSION AND REMOVAL OF CYST LUMBAR FIVE THROUGH SACRAL ONE;  Surgeon: Venita Lick, MD;  Location: [facility];  Service: Orthopedics;  Laterality: N/A;   LYSIS OF ADHESION  11/19/2022   Procedure: LAPAROSCOPIC LYSIS OF ADHESION;  Surgeon: Abigail Miyamoto,  MD;  Location: [facility];  Service: General;;   MASS EXCISION Left 01/04/2014   Procedure: LEFT MIDDLE FINGER MASS EXCISION WITH NAILBED REPAIR AND ADVANCEMENT FLAP;  Surgeon: Dominica Severin, MD;  Location: [facility];  Service: Orthopedics;  Laterality: Left;   mass removed from 2nd finger Left 03/2013   squamous carcinoma   nissen fundoplication 1995     POLYPECTOMY  06/18/2022   Procedure: POLYPECTOMY;  Surgeon: Lanelle Bal, DO;  [facility];  Service: Endoscopy;;   PROSTATE SURGERY  2014   TURP   right wrist fusion 12/12 - pt wearing brace on the wrist-not started physical therapy yet Right    fusion prevent limited ROM to right wrist   SPINE SURGERY  2009   cervical spine fusion   thyroid nodule     removed   TOTAL KNEE REVISION Right 11/17/2013   Procedure: RIGHT KNEE POLYETHYLENE REVISION, bone graft;  Surgeon: Loanne Drilling, MD;  [facility];  Service: Orthopedics;  Laterality: Right;   TOTAL SHOULDER ARTHROPLASTY Right 01/28/2018   Procedure: RIGHT TOTAL SHOULDER ARTHROPLASTY;  Surgeon: Beverely Low, MD;  Location: [facility] OR;  Service: Orthopedics;  Laterality: Right;   TOTAL SHOULDER ARTHROPLASTY Left 07/15/2018   Procedure: LEFT SHOULDER ANATOMIC TOTAL SHOULDER ARTHROPLASTY;  Surgeon: Beverely Low, MD;  Location[facility] OR;  Service: Orthopedics;  Laterality: Left;   TRANSURETHRAL RESECTION OF PROSTATE  10/09/2011   Procedure: TRANSURETHRAL RESECTION OF THE PROSTATE WITH GYRUS INSTRUMENTS;  Surgeon: Antony Haste, MD;  Location[facility];  Service: Urology;  Laterality: N/A;   uvvvp     XI ROBOTIC ASSISTED INGUINAL HERNIA REPAIR WITH MESH Bilateral 07/31/2016   Procedure: XI ROBOTIC BILATERAL INGUINAL HERNIA REPAIR WITH MESH;  Surgeon: Luretha Murphy, MD;  Location: [facility];  Service: General;  Laterality: Bilateral;    Family History  Problem Relation Age of Onset   Arthritis Mother    Hearing loss Mother    Hyperlipidemia Mother     Hypertension Mother    Deep vein thrombosis Mother    Bone cancer Father        deceased   Cancer Father    Diabetes Father    Prostate cancer Father    Ulcerative colitis Father    Diabetes Sister    Hearing loss Maternal Grandmother    Hearing loss Maternal Grandfather    Diabetes Paternal Grandfather     Current Outpatient Medications on File Prior to Visit  Medication Sig Dispense Refill   acetaminophen (TYLENOL) 500 MG tablet Take 500 mg by mouth every 6 (six) hours as needed for mild pain or headache.     albuterol (VENTOLIN HFA) 108 (90 Base) MCG/ACT inhaler Inhale 2  puffs into the lungs every 4 (four) hours as needed for wheezing or shortness of breath. 18 g 2   atorvastatin (LIPITOR) 40 MG tablet TAKE 1 TABLET(40 MG) BY MOUTH DAILY (Patient taking differently: Take 40 mg by mouth at bedtime.) 90 tablet 3   Calcium Carb-Cholecalciferol (CALCIUM + D3 PO) Take 1 tablet by mouth at bedtime.     diphenhydrAMINE (BENADRYL) 25 mg capsule Take 25 mg by mouth daily as needed for allergies.     esomeprazole (NEXIUM) 20 MG capsule Take 1 capsule (20 mg total) by mouth daily at 12 noon. 30 capsule 11   fluticasone (FLONASE) 50 MCG/ACT nasal spray Place 1 spray into both nostrils at bedtime as needed for allergies.     hydroxypropyl methylcellulose / hypromellose (ISOPTO TEARS / GONIOVISC) 2.5 % ophthalmic solution Place 1 drop into both eyes 3 (three) times daily as needed for dry eyes.     latanoprost (XALATAN) 0.005 % ophthalmic solution Place 1 drop into both eyes at bedtime.     meclizine (ANTIVERT) 25 MG tablet Take 1 tablet (25 mg total) by mouth 3 (three) times daily as needed for dizziness. 30 tablet 0   metoprolol tartrate (LOPRESSOR) 25 MG tablet Take 25 mg by mouth See admin instructions. Take 25 mg by mouth one to three times a day when in A-Fib (call 9-1-1 if no relief after three doses)     Multiple Vitamin (MULTIVITAMIN WITH MINERALS) TABS tablet Take 1 tablet by mouth at  bedtime.     OVER THE COUNTER MEDICATION Take 1-2 tablets by mouth See admin instructions. Tylenol for Tension headaches- Take 1-2 tablets by mouth every 6 hours as needed for headaches     PRESCRIPTION MEDICATION Inhale into the lungs See admin instructions. CPAP- At bedtime     sodium chloride (OCEAN) 0.65 % SOLN nasal spray Place 1 spray into both nostrils as needed for congestion. 88 mL 0   traZODone (DESYREL) 50 MG tablet Take 100 mg by mouth at bedtime.     vortioxetine HBr (TRINTELLIX) 10 MG TABS tablet Take 1 tablet (10 mg total) by mouth at bedtime. 30 tablet 5   XARELTO 20 MG TABS tablet TAKE 1 TABLET BY MOUTH DAILY WITH SUPPER 90 tablet 1   No current facility-administered medications on file prior to visit.    Allergies  Allergen Reactions   Latex Rash and Other (See Comments)    CONDOM CATHETER- SEVERE REACTION!!!!!   Adhesive [Tape] Other (See Comments)    Causes blisters - pls use paper tape    Social History   Substance and Sexual Activity  Alcohol Use Yes   Alcohol/week: 3.0 standard drinks of alcohol   Types: 3 Cans of beer per week   Comment: 4 beers a week    Social History   Tobacco Use  Smoking Status Former   Packs/day: 1.00   Years: 15.00   Additional pack years: 0.00   Total pack years: 15.00   Types: Cigarettes   Quit date: 08/04/1983   Years since quitting: 39.3  Smokeless Tobacco Never  Tobacco Comments   quit smoking 1985    Review of Systems  Constitutional: Negative.   HENT:  Positive for ear pain.   Eyes: Negative.   Respiratory: Negative.    Cardiovascular: Negative.   Gastrointestinal:  Positive for abdominal pain and nausea.  Genitourinary:  Positive for frequency.  Musculoskeletal:  Positive for back pain and joint pain.  Skin: Negative.   Neurological:  Positive  for dizziness.  Endo/Heme/Allergies: Negative.   Psychiatric/Behavioral: Negative.      Objective   Vitals:   12/03/22 1129  BP: 110/73  Pulse: (!) 58  Resp:  16  Temp: 97.7 F (36.5 C)  SpO2: 91%    Physical Exam Vitals reviewed.  Constitutional:      Appearance: Normal appearance. He is not ill-appearing.  HENT:     Head: Normocephalic and atraumatic.  Cardiovascular:     Rate and Rhythm: Normal rate and regular rhythm.     Heart sounds: Normal heart sounds. No murmur heard.    No friction rub. No gallop.  Pulmonary:     Effort: Pulmonary effort is normal. No respiratory distress.     Breath sounds: Normal breath sounds. No stridor. No wheezing, rhonchi or rales.  Abdominal:     General: There is no distension.     Palpations: Abdomen is soft. There is no mass.     Tenderness: There is no abdominal tenderness. There is no guarding or rebound.     Hernia: No hernia is present.     Comments: Multiple healing surgical scars present.  There appears to be some laxity in the inguinal regions over the internal ring bilaterally.  I could not appreciate a distinct hernia on either side.  Skin:    General: Skin is warm and dry.  Neurological:     Mental Status: He is alert and oriented to person, place, and time.    CT images personally reviewed.  The impression is small bilateral fat-containing inguinal hernias present.  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 Assessment  I do not feel the patient has recurrent bilateral inguinal hernias that are symptomatic to require surgical intervention.  I explained to the patient that this could be lipomatous tissue of the cord.  He understands and does not want any surgical intervention unless absolutely necessary. Plan  Monitor for any signs or symptoms of recurrent inguinal hernias.  Follow-up with me as needed.

## 2023-01-13 ENCOUNTER — Other Ambulatory Visit: Payer: Self-pay | Admitting: Internal Medicine

## 2023-01-13 DIAGNOSIS — H401131 Primary open-angle glaucoma, bilateral, mild stage: Secondary | ICD-10-CM | POA: Diagnosis not present

## 2023-01-13 DIAGNOSIS — H8110 Benign paroxysmal vertigo, unspecified ear: Secondary | ICD-10-CM

## 2023-01-19 ENCOUNTER — Ambulatory Visit: Payer: Medicare HMO | Admitting: Internal Medicine

## 2023-01-22 ENCOUNTER — Encounter: Payer: Self-pay | Admitting: Internal Medicine

## 2023-01-22 ENCOUNTER — Ambulatory Visit (INDEPENDENT_AMBULATORY_CARE_PROVIDER_SITE_OTHER): Payer: Medicare HMO | Admitting: Internal Medicine

## 2023-01-22 VITALS — BP 136/80 | HR 63 | Ht 67.0 in | Wt 220.8 lb

## 2023-01-22 DIAGNOSIS — G4733 Obstructive sleep apnea (adult) (pediatric): Secondary | ICD-10-CM | POA: Diagnosis not present

## 2023-01-22 DIAGNOSIS — H8113 Benign paroxysmal vertigo, bilateral: Secondary | ICD-10-CM

## 2023-01-22 DIAGNOSIS — F339 Major depressive disorder, recurrent, unspecified: Secondary | ICD-10-CM | POA: Diagnosis not present

## 2023-01-22 DIAGNOSIS — K56609 Unspecified intestinal obstruction, unspecified as to partial versus complete obstruction: Secondary | ICD-10-CM

## 2023-01-22 DIAGNOSIS — G4700 Insomnia, unspecified: Secondary | ICD-10-CM

## 2023-01-22 DIAGNOSIS — R310 Gross hematuria: Secondary | ICD-10-CM | POA: Insufficient documentation

## 2023-01-22 DIAGNOSIS — R300 Dysuria: Secondary | ICD-10-CM | POA: Diagnosis not present

## 2023-01-22 DIAGNOSIS — E785 Hyperlipidemia, unspecified: Secondary | ICD-10-CM

## 2023-01-22 DIAGNOSIS — I480 Paroxysmal atrial fibrillation: Secondary | ICD-10-CM

## 2023-01-22 DIAGNOSIS — K219 Gastro-esophageal reflux disease without esophagitis: Secondary | ICD-10-CM

## 2023-01-22 DIAGNOSIS — I10 Essential (primary) hypertension: Secondary | ICD-10-CM

## 2023-01-22 LAB — POCT URINALYSIS DIP (CLINITEK)
Bilirubin, UA: NEGATIVE
Glucose, UA: NEGATIVE mg/dL
Ketones, POC UA: NEGATIVE mg/dL
Leukocytes, UA: NEGATIVE
Nitrite, UA: NEGATIVE
POC PROTEIN,UA: NEGATIVE
Spec Grav, UA: 1.01 (ref 1.010–1.025)
Urobilinogen, UA: 0.2 E.U./dL
pH, UA: 6 (ref 5.0–8.0)

## 2023-01-22 NOTE — Progress Notes (Signed)
Established Patient Office Visit  Subjective   Patient ID: [name], male    DOB: 1951-06-04  Age: 72 y.o. MRN: [id number]  Chief Complaint  Patient presents with   Hyperlipidemia    Follow up   Mr. [name] returns to care today for follow-up.  He was last evaluated by me on 2/23 endorsing left ear discomfort and persistent symptoms of dyspepsia.  Protonix was discontinued in favor of Nexium, and he was treated with Augmentin for acute otitis media.  Nuclear lymphadenopathy was appreciated on exam.  A soft tissue ultrasound was ordered.  In the interim, he was admitted to [facility] in April and treated for small bowel obstruction, ultimately undergoing laparoscopic lysis of adhesions on 4/18.  His postoperative course has been uncomplicated.  He has been seen by pulmonology and gastroenterology for follow-up.  Mr. [name] reports feeling well today.  He is asymptomatic currently.  He states that earlier today he noted blood in his urine at the beginning of his urine stream.  This was the first time he has noticed this.  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   Cataracts, both eyes    worst on left   Colon polyps    Complication of anesthesia    pt had spinal for a knee replacement--"woke up" during surgery--and sick after surgery   Depression    Diverticulosis    DVT (deep venous thrombosis) (HCC) 2001 or 2002   right leg-required hospitalization x 8 days   DVT (deep venous thrombosis) (HCC) 1996   right   GERD (gastroesophageal reflux disease) 11/18/2022   Glaucoma    both eyes   H/O hiatal hernia    Hearing  impaired person, bilateral    bilateral   Hemorrhoid    History of IBS    History of kidney stones    x 1-passed   Hyperlipidemia 2012   Hypertension    Lumbago    Lumbar post-laminectomy syndrome    Myocardial infarction [facility]) December 2019   PAF (paroxysmal atrial fibrillation) (HCC)    PONV (postoperative nausea and vomiting)    Sleep apnea    pt uses cpap - setting of 16   Ulcer 1966   patient denies   Ulcerative colitis (HCC)    patient denies   Wears glasses    Wears hearing aid    Past Surgical History:  Procedure Laterality Date   APPENDECTOMY  1993   BACK SURGERY  2008   bone spur removed    BALLOON DILATION N/A 06/18/2022   Procedure: BALLOON DILATION;  Surgeon: Lanelle Bal, DO;  [facility];  Service: Endoscopy;  Laterality: N/A;   bilateral carpal tunnel release 2009     BIOPSY  06/18/2022   Procedure: BIOPSY;  Surgeon: Lanelle Bal, DO;  [facility];  Service: Endoscopy;;   cervical fusion 2011--pt has good range of motion neck     CHOLECYSTECTOMY  1994   COLONOSCOPY WITH PROPOFOL N/A 06/18/2022   Procedure: COLONOSCOPY WITH PROPOFOL;  Surgeon: Marletta Lor,  Hennie Duos, DO;  [facility];  Service: Endoscopy;  Laterality: N/A;  8:15 AM   ESOPHAGOGASTRODUODENOSCOPY (EGD) WITH PROPOFOL N/A 06/18/2022   Procedure: ESOPHAGOGASTRODUODENOSCOPY (EGD) WITH PROPOFOL;  Surgeon: Lanelle Bal, DO;  [facility];  Service: Endoscopy;  Laterality: N/A;   EYE SURGERY N/A    [employer] 07/13/2020   HERNIA REPAIR  2008   INSERTION OF MESH Bilateral 07/31/2016   Procedure: INSERTION OF MESH;  Surgeon: Luretha Murphy, MD;  Location: [facility];  Service: General;  Laterality: Bilateral;   JOINT REPLACEMENT Bilateral    2001 left total knee and 1985 right total knee   LAPAROSCOPY N/A 11/19/2022   Procedure: LAPAROSCOPY DIAGNOSTIC;  Surgeon: Abigail Miyamoto, MD;  Location[facility] OR;  Service: General;  Laterality: N/A;   LUMBAR  LAMINECTOMY/DECOMPRESSION MICRODISCECTOMY N/A 07/24/2021   Procedure: DECOMPRESSION AND REMOVAL OF CYST LUMBAR FIVE THROUGH SACRAL ONE;  Surgeon: Venita Lick, MD;  Location: [facility] OR;  Service: Orthopedics;  Laterality: N/A;   LYSIS OF ADHESION  11/19/2022   Procedure: LAPAROSCOPIC LYSIS OF ADHESION;  Surgeon: Abigail Miyamoto, MD;  Location: [facility] OR;  Service: General;;   MASS EXCISION Left 01/04/2014   Procedure: LEFT MIDDLE FINGER MASS EXCISION WITH NAILBED REPAIR AND ADVANCEMENT FLAP;  Surgeon: Dominica Severin, MD;  Location: [facility];  Service: Orthopedics;  Laterality: Left;   mass removed from 2nd finger Left 03/2013   squamous carcinoma   nissen fundoplication 1995     POLYPECTOMY  06/18/2022   Procedure: POLYPECTOMY;  Surgeon: Lanelle Bal, DO;  [facility];  Service: Endoscopy;;   PROSTATE SURGERY  2014   TURP   right wrist fusion 12/12 - pt wearing brace on the wrist-not started physical therapy yet Right    fusion prevent limited ROM to right wrist   SPINE SURGERY  2009   cervical spine fusion   thyroid nodule     removed   TOTAL KNEE REVISION Right 11/17/2013   Procedure: RIGHT KNEE POLYETHYLENE REVISION, bone graft;  Surgeon: Loanne Drilling, MD;  Location: [facility];  Service: Orthopedics;  Laterality: Right;   TOTAL SHOULDER ARTHROPLASTY Right 01/28/2018   Procedure: RIGHT TOTAL SHOULDER ARTHROPLASTY;  Surgeon: Beverely Low, MD;  Location: [facility] OR;  Service: Orthopedics;  Laterality: Right;   TOTAL SHOULDER ARTHROPLASTY Left 07/15/2018   Procedure: LEFT SHOULDER ANATOMIC TOTAL SHOULDER ARTHROPLASTY;  Surgeon: Beverely Low, MD;  Location: [facility] OR;  Service: Orthopedics;  Laterality: Left;   TRANSURETHRAL RESECTION OF PROSTATE  10/09/2011   Procedure: TRANSURETHRAL RESECTION OF THE PROSTATE WITH GYRUS INSTRUMENTS;  Surgeon: Antony Haste, MD;  Location: [facility];  Service: Urology;  Laterality: N/A;   uvvvp     XI ROBOTIC ASSISTED INGUINAL  HERNIA REPAIR WITH MESH Bilateral 07/31/2016   Procedure: XI ROBOTIC BILATERAL INGUINAL HERNIA REPAIR WITH MESH;  Surgeon: Luretha Murphy, MD;  Location: [facility];  Service: General;  Laterality: Bilateral;   Social History   Tobacco Use   Smoking status: Former    Packs/day: 1.00    Years: 15.00    Additional pack years: 0.00    Total pack years: 15.00    Types: Cigarettes    Quit date: 08/04/1983    Years since quitting: 39.4   Smokeless tobacco: Never   Tobacco comments:    quit smoking 1985  Vaping Use   Vaping Use: Never used  Substance Use Topics   Alcohol use: Yes    Alcohol/week: 3.0 standard drinks  of alcohol    Types: 3 Cans of beer per week    Comment: 4 beers a week   Drug use: No   Family History  Problem Relation Age of Onset   Arthritis Mother    Hearing loss Mother    Hyperlipidemia Mother    Hypertension Mother    Deep vein thrombosis Mother    Bone cancer Father        deceased   Cancer Father    Diabetes Father    Prostate cancer Father    Ulcerative colitis Father    Diabetes Sister    Hearing loss Maternal Grandmother    Hearing loss Maternal Grandfather    Diabetes Paternal Grandfather    Allergies  Allergen Reactions   Latex Rash and Other (See Comments)    CONDOM CATHETER- SEVERE REACTION!!!!!   Adhesive [Tape] Other (See Comments)    Causes blisters - pls use paper tape   Review of Systems  Constitutional:  Negative for chills and fever.  HENT:  Negative for sore throat.   Respiratory:  Negative for cough and shortness of breath.   Cardiovascular:  Negative for chest pain, palpitations and leg swelling.  Gastrointestinal:  Negative for abdominal pain, blood in stool, constipation, diarrhea, nausea and vomiting.  Genitourinary:  Positive for hematuria. Negative for dysuria.  Musculoskeletal:  Negative for myalgias.  Skin:  Negative for itching and rash.  Neurological:  Negative for dizziness and headaches.  Psychiatric/Behavioral:   Negative for depression and suicidal ideas.      Objective:     BP 136/80   Pulse 63   Ht 5\' 7"  (1.702 m)   Wt 220 lb 12.8 oz (100.2 kg)   SpO2 93%   BMI 34.58 kg/m  BP Readings from Last 3 Encounters:  01/22/23 136/80  12/03/22 110/73  11/20/22 124/78   Physical Exam Vitals reviewed.  Constitutional:      General: He is not in acute distress.    Appearance: Normal appearance. He is obese. He is not ill-appearing.  HENT:     Head: Normocephalic and atraumatic.     Right Ear: External ear normal.     Left Ear: External ear normal.     Nose: Nose normal. No congestion or rhinorrhea.     Mouth/Throat:     Mouth: Mucous membranes are moist.     Pharynx: Oropharynx is clear.  Eyes:     General: No scleral icterus.    Extraocular Movements: Extraocular movements intact.     Conjunctiva/sclera: Conjunctivae normal.     Pupils: Pupils are equal, round, and reactive to light.  Cardiovascular:     Rate and Rhythm: Normal rate and regular rhythm.     Pulses: Normal pulses.     Heart sounds: Normal heart sounds. No murmur heard. Pulmonary:     Effort: Pulmonary effort is normal.     Breath sounds: Normal breath sounds. No wheezing, rhonchi or rales.  Abdominal:     General: Abdomen is flat. Bowel sounds are normal. There is no distension.     Palpations: Abdomen is soft.     Tenderness: There is no abdominal tenderness.     Hernia: A hernia (Large ventral hernia.  Well-healed laparoscopic incisions.) is present.  Musculoskeletal:        General: No swelling or deformity. Normal range of motion.     Cervical back: Normal range of motion.  Skin:    General: Skin is warm and dry.  Capillary Refill: Capillary refill takes less than 2 seconds.  Neurological:     General: No focal deficit present.     Mental Status: He is alert and oriented to person, place, and time.     Motor: No weakness.  Psychiatric:        Mood and Affect: Mood normal.        Behavior: Behavior  normal.        Thought Content: Thought content normal.   Last CBC Lab Results  Component Value Date   WBC 8.8 11/20/2022   HGB 14.9 11/20/2022   HCT 41.2 11/20/2022   MCV 88.8 11/20/2022   MCH 32.1 11/20/2022   RDW 12.6 11/20/2022   PLT 157 11/20/2022   Last metabolic panel Lab Results  Component Value Date   GLUCOSE 110 (H) 11/20/2022   NA 139 11/20/2022   K 3.8 11/20/2022   CL 103 11/20/2022   CO2 28 11/20/2022   BUN 22 11/20/2022   CREATININE 1.00 11/20/2022   GFRNONAA >60 11/20/2022   CALCIUM 8.9 11/20/2022   PHOS 2.1 (L) 07/19/2018   PROT 6.2 (L) 11/20/2022   ALBUMIN 3.4 (L) 11/20/2022   LABGLOB 2.4 03/09/2022   AGRATIO 1.9 03/09/2022   BILITOT 1.0 11/20/2022   ALKPHOS 58 11/20/2022   AST 27 11/20/2022   ALT 22 11/20/2022   ANIONGAP 8 11/20/2022   Last lipids Lab Results  Component Value Date   CHOL 129 04/27/2022   HDL 49 04/27/2022   LDLCALC 63 04/27/2022   TRIG 86 04/27/2022   CHOLHDL 2.6 04/27/2022   Last hemoglobin A1c Lab Results  Component Value Date   HGBA1C 6.0 (H) 04/27/2022   Last thyroid functions Lab Results  Component Value Date   TSH 3.90 07/11/2020   Last vitamin D Lab Results  Component Value Date   VD25OH 85.9 04/27/2022   Last vitamin B12 and Folate Lab Results  Component Value Date   VITAMINB12 492 04/27/2022   FOLATE 16.3 04/27/2022     Assessment & Plan:   Problem List Items Addressed This Visit       Essential hypertension    Remains adequately controlled off antihypertensive therapy.      Paroxysmal atrial fibrillation (HCC)    Regular rate and rhythm detected on exam today.  Currently prescribed Xarelto.      OSA (obstructive sleep apnea)    Continues to endorse nightly compliance with CPAP.      SBO (small bowel obstruction) (HCC)    Recent hospital admission in April for SBO.  Ultimately underwent laparoscopic lysis of adhesions.  His postoperative course has been uncomplicated and symptoms have  resolved.  He has been seen by general surgery for postoperative follow-up and told that he can return to care on an as-needed basis.      GERD (gastroesophageal reflux disease)    Followed by gastroenterology (Dr. Marletta Lor).  Symptoms are adequately controlled with Nexium.      BPPV (benign paroxysmal positional vertigo)    Symptoms remain adequately controlled with as needed use of meclizine.      Gross hematuria    He endorses gross hematuria when initiating his urine stream earlier today.  This is the first time he has noticed this.  POC UA today shows moderate blood but is negative for other markers of infection.  He is asymptomatic currently. -Further workup pending urine microscopy results      Hyperlipidemia    He is currently prescribed atorvastatin 40 mg daily.  Lipid panel updated in September 2023 and reflects adequate control.      Insomnia    Symptoms are well-controlled with nightly trazodone.      Depression, recurrent (HCC)    Mood remains stable on Trintellix.      Return in about 6 months (around 07/24/2023) for CPE.   Billie Lade, MD

## 2023-01-22 NOTE — Assessment & Plan Note (Signed)
Followed by gastroenterology (Dr. Marletta Lor).  Symptoms are adequately controlled with Nexium.

## 2023-01-22 NOTE — Assessment & Plan Note (Signed)
Continues to endorse nightly compliance with CPAP.

## 2023-01-22 NOTE — Assessment & Plan Note (Signed)
He is currently prescribed atorvastatin 40 mg daily.  Lipid panel updated in September 2023 and reflects adequate control.

## 2023-01-22 NOTE — Assessment & Plan Note (Signed)
Regular rate and rhythm detected on exam today.  Currently prescribed Xarelto.

## 2023-01-22 NOTE — Assessment & Plan Note (Addendum)
Mood remains stable on Trintellix.

## 2023-01-22 NOTE — Assessment & Plan Note (Signed)
Symptoms are well-controlled with nightly trazodone.

## 2023-01-22 NOTE — Patient Instructions (Signed)
It was a pleasure to see you today.  Thank you for giving [place] the opportunity to be involved in your care.  Below is a brief recap of your visit and next steps.  We will plan to see you again in 6 months.  Summary No medication changes today I am glad to see things are going well Follow up in December for annual physical

## 2023-01-22 NOTE — Assessment & Plan Note (Signed)
He endorses gross hematuria when initiating his urine stream earlier today.  This is the first time he has noticed this.  POC UA today shows moderate blood but is negative for other markers of infection.  He is asymptomatic currently. -Further workup pending urine microscopy results

## 2023-01-22 NOTE — Assessment & Plan Note (Signed)
Remains adequately controlled off antihypertensive therapy.

## 2023-01-22 NOTE — Assessment & Plan Note (Signed)
Symptoms remain adequately controlled with as needed use of meclizine.

## 2023-01-22 NOTE — Assessment & Plan Note (Signed)
Recent hospital admission in April for SBO.  Ultimately underwent laparoscopic lysis of adhesions.  His postoperative course has been uncomplicated and symptoms have resolved.  He has been seen by general surgery for postoperative follow-up and told that he can return to care on an as-needed basis.

## 2023-01-23 LAB — MICROSCOPIC EXAMINATION
Bacteria, UA: NONE SEEN
Casts: NONE SEEN /lpf
Epithelial Cells (non renal): NONE SEEN /hpf (ref 0–10)
WBC, UA: NONE SEEN /hpf (ref 0–5)

## 2023-01-23 LAB — URINALYSIS, ROUTINE W REFLEX MICROSCOPIC
Bilirubin, UA: NEGATIVE
Glucose, UA: NEGATIVE
Ketones, UA: NEGATIVE
Leukocytes,UA: NEGATIVE
Nitrite, UA: NEGATIVE
Protein,UA: NEGATIVE
Specific Gravity, UA: 1.011 (ref 1.005–1.030)
Urobilinogen, Ur: 0.2 mg/dL (ref 0.2–1.0)
pH, UA: 6.5 (ref 5.0–7.5)

## 2023-01-25 ENCOUNTER — Telehealth: Payer: Self-pay | Admitting: Internal Medicine

## 2023-01-25 NOTE — Telephone Encounter (Signed)
Patient returning phone call about lab results. 

## 2023-01-25 NOTE — Telephone Encounter (Signed)
Spoke with patient.

## 2023-02-15 DIAGNOSIS — G8929 Other chronic pain: Secondary | ICD-10-CM | POA: Diagnosis not present

## 2023-02-15 DIAGNOSIS — Q181 Preauricular sinus and cyst: Secondary | ICD-10-CM | POA: Diagnosis not present

## 2023-02-15 DIAGNOSIS — H9202 Otalgia, left ear: Secondary | ICD-10-CM | POA: Diagnosis not present

## 2023-02-15 DIAGNOSIS — J3489 Other specified disorders of nose and nasal sinuses: Secondary | ICD-10-CM | POA: Diagnosis not present

## 2023-02-15 DIAGNOSIS — R590 Localized enlarged lymph nodes: Secondary | ICD-10-CM | POA: Diagnosis not present

## 2023-02-17 DIAGNOSIS — M961 Postlaminectomy syndrome, not elsewhere classified: Secondary | ICD-10-CM | POA: Diagnosis not present

## 2023-02-17 DIAGNOSIS — K56609 Unspecified intestinal obstruction, unspecified as to partial versus complete obstruction: Secondary | ICD-10-CM | POA: Diagnosis not present

## 2023-02-17 DIAGNOSIS — Z79891 Long term (current) use of opiate analgesic: Secondary | ICD-10-CM | POA: Diagnosis not present

## 2023-02-24 ENCOUNTER — Ambulatory Visit: Payer: Medicare HMO | Admitting: Internal Medicine

## 2023-03-03 ENCOUNTER — Other Ambulatory Visit: Payer: Self-pay | Admitting: Internal Medicine

## 2023-04-14 DIAGNOSIS — H401131 Primary open-angle glaucoma, bilateral, mild stage: Secondary | ICD-10-CM | POA: Diagnosis not present

## 2023-04-22 ENCOUNTER — Encounter (HOSPITAL_BASED_OUTPATIENT_CLINIC_OR_DEPARTMENT_OTHER): Payer: Self-pay | Admitting: Pulmonary Disease

## 2023-04-22 ENCOUNTER — Ambulatory Visit (HOSPITAL_BASED_OUTPATIENT_CLINIC_OR_DEPARTMENT_OTHER): Payer: Medicare HMO | Admitting: Pulmonary Disease

## 2023-04-22 VITALS — BP 130/78 | HR 61 | Ht 67.0 in | Wt 222.0 lb

## 2023-04-22 DIAGNOSIS — G4733 Obstructive sleep apnea (adult) (pediatric): Secondary | ICD-10-CM

## 2023-04-22 DIAGNOSIS — J4540 Moderate persistent asthma, uncomplicated: Secondary | ICD-10-CM | POA: Diagnosis not present

## 2023-04-22 NOTE — Patient Instructions (Signed)
Follow up in 1 year with Dr. Vassie Loll

## 2023-04-22 NOTE — Progress Notes (Signed)
[facility]  Chief Complaint  Patient presents with   Sleep Apnea    Constitutional:  BP 130/78   Pulse 61   Ht 5\' 7"  (1.702 m)   Wt 222 lb (100.7 kg)   SpO2 94%   BMI 34.77 kg/m   Past Medical History:  HTN, A fib, HLD, GERD, Ulcerative colitis, Back pain, Nephrolithiasis, IBS, HH, Glaucoma, DVT 2001 and 2002, Diverticulosis, Depression, Colon polyp, Cataracts, Squamous cell skin cancer, BPH, OA  Past Surgical History:  He  has a past surgical history that includes right wrist fusion 12/12 - pt wearing brace on the wrist-not started physical therapy yet (Right); cervical fusion 2011--pt has good range of motion neck; bilateral carpal tunnel release 2009; Back surgery (2008); nissen fundoplication 1995; Cholecystectomy (1994); Appendectomy (1993); Transurethral resection of prostate (10/09/2011); Prostate surgery (2014); Spine surgery (2009); mass removed from 2nd finger (Left, 03/2013); uvvvp; Total knee revision (Right, 11/17/2013); Mass excision (Left, 01/04/2014); Joint replacement (Bilateral); XI Robotic assisted inguinal hernia repair with mesh (Bilateral, 07/31/2016); Insertion of mesh (Bilateral, 07/31/2016); Total shoulder arthroplasty (Right, 01/28/2018); thyroid nodule; Total shoulder arthroplasty (Left, 07/15/2018); Eye surgery (N/A); Lumbar laminectomy/decompression microdiscectomy (N/A, 07/24/2021); Colonoscopy with propofol (N/A, 06/18/2022); Esophagogastroduodenoscopy (egd) with propofol (N/A, 06/18/2022); Balloon dilation (N/A, 06/18/2022); biopsy (06/18/2022); polypectomy (06/18/2022); Hernia repair (2008); laparoscopy (N/A, 11/19/2022); and Lysis of adhesion (11/19/2022).  Brief Summary:  [name] is a 72 y.o. male former smoker with obstructive sleep apnea, allergic asthma, and perennial allergic rhinitis.      Subjective:   He had surgery for bowel obstruction.  Lost weight after this.  Breathing has been okay.  Has  more sinus congestion from allergies over last few weeks.  More when he works outside.  Uses CPAP nightly.  Has nasal mask.  Doesn't use as much water with this mask, but liked the fit of full face mask better.   Physical Exam:   Appearance - well kempt   ENMT - no sinus tenderness, no oral exudate, no LAN, Mallampati 3 airway, no stridor, deviated septum  Respiratory - equal breath sounds bilaterally, no wheezing or rales  CV - s1s2 regular rate and rhythm, no murmurs  Ext - no clubbing, no edema  Skin - no rashes  Psych - normal mood and affect     Pulmonary testing:  FeNO 09/14/18 >> 24 Spirometry 09/14/18 >> FEV1 2.4 (83%), FEV1% 83 IgE 03/09/22 >> 7 PFT 04/28/22 >> FEV1 2.78 (102%), FEV1% 85, TLC 7.50 (118%), DLCO 112%  Sleep Tests:  PSG 12/21/91 >> AHI 56 Auto CPAP 03/22/23 to 04/20/23 >> used on 30 of 30 nights with average 6 hrs 59 min.  Average AHI 3.5 with median CPAP 10 and 95 th percentile CPAP 12 cm H2O  Cardiac Tests:  Echo 07/19/18 >> EF 60 to 65%, grade 2 DD  Social History:  He  reports that he quit smoking about 39 years ago. His smoking use included cigarettes. He started smoking about 54 years ago. He has a 15 pack-year smoking history. He has never used smokeless tobacco. He reports current alcohol use of about 3.0 standard drinks of alcohol per week. He reports that he does not use drugs.  Family History:  His family history includes Arthritis in his mother; Bone cancer in his father; Cancer in his father; Deep vein thrombosis in his mother; Diabetes in his father, paternal grandfather, and sister; Hearing loss in his maternal grandfather, maternal grandmother, and mother; Hyperlipidemia in  his mother; Hypertension in his mother; Prostate cancer in his father; Ulcerative colitis in his father.     Assessment/Plan:   Obstructive sleep apnea. - he is compliant with therapy and reports benefit - he uses [employer] for his DME - current CPAP ordered August  2023 - continue auto CPAP 5 to 20 cm H2O - continue auto CPAP 5 to 20 cm H2O  Chronic productive cough from allergic asthma and perennial allergic rhinitis with deviated nasal septum. Allergic and CPAP rhinitis. - prn albuterol - continue flonase, benadryl prn  Obesity. - discussed importance of weight loss  Time Spent Involved in Patient Care on Day of Examination:  26 minutes  Follow up:   There are no Patient Instructions on file for this visit.  Medication List:   Allergies as of 04/22/2023       Reactions   Latex Rash, Other (See Comments)   CONDOM CATHETER- SEVERE REACTION!!!!!   Adhesive [tape] Other (See Comments)   Causes blisters - pls use paper tape        Medication List        Accurate as of April 22, 2023 10:05 AM. If you have any questions, ask your nurse or doctor.          acetaminophen 500 MG tablet Commonly known as: TYLENOL Take 500 mg by mouth every 6 (six) hours as needed for mild pain or headache.   albuterol 108 (90 Base) MCG/ACT inhaler Commonly known as: VENTOLIN HFA Inhale 2 puffs into the lungs every 4 (four) hours as needed for wheezing or shortness of breath.   atorvastatin 40 MG tablet Commonly known as: LIPITOR TAKE 1 TABLET(40 MG) BY MOUTH DAILY What changed: See the new instructions.   CALCIUM + D3 PO Take 1 tablet by mouth at bedtime.   diphenhydrAMINE 25 mg capsule Commonly known as: BENADRYL Take 25 mg by mouth daily as needed for allergies.   esomeprazole 20 MG capsule Commonly known as: NexIUM Take 1 capsule (20 mg total) by mouth daily at 12 noon.   fluticasone 50 MCG/ACT nasal spray Commonly known as: FLONASE Place 1 spray into both nostrils at bedtime as needed for allergies.   hydroxypropyl methylcellulose / hypromellose 2.5 % ophthalmic solution Commonly known as: ISOPTO TEARS / GONIOVISC Place 1 drop into both eyes 3 (three) times daily as needed for dry eyes.   latanoprost 0.005 % ophthalmic  solution Commonly known as: XALATAN Place 1 drop into both eyes at bedtime.   meclizine 25 MG tablet Commonly known as: ANTIVERT TAKE 1 TABLET(25 MG) BY MOUTH THREE TIMES DAILY AS NEEDED FOR DIZZINESS   metoprolol tartrate 25 MG tablet Commonly known as: LOPRESSOR Take 25 mg by mouth See admin instructions. Take 25 mg by mouth one to three times a day when in A-Fib (call 9-1-1 if no relief after three doses)   multivitamin with minerals Tabs tablet Take 1 tablet by mouth at bedtime.   OVER THE COUNTER MEDICATION Take 1-2 tablets by mouth See admin instructions. Tylenol for Tension headaches- Take 1-2 tablets by mouth every 6 hours as needed for headaches   PRESCRIPTION MEDICATION Inhale into the lungs See admin instructions. CPAP- At bedtime   sodium chloride 0.65 % Soln nasal spray Commonly known as: OCEAN Place 1 spray into both nostrils as needed for congestion.   traZODone 50 MG tablet Commonly known as: DESYREL Take 100 mg by mouth at bedtime.   vortioxetine HBr 10 MG Tabs tablet Commonly known as:  Trintellix Take 1 tablet (10 mg total) by mouth at bedtime.   Xarelto 20 MG Tabs tablet Generic drug: rivaroxaban TAKE 1 TABLET BY MOUTH DAILY WITH SUPPER        Signature:  Coralyn Helling, MD [facility] Pager - [phone] 04/22/2023, 10:05 AM

## 2023-05-07 ENCOUNTER — Encounter: Payer: Self-pay | Admitting: Internal Medicine

## 2023-05-07 ENCOUNTER — Ambulatory Visit (INDEPENDENT_AMBULATORY_CARE_PROVIDER_SITE_OTHER): Payer: Medicare HMO | Admitting: Internal Medicine

## 2023-05-07 VITALS — BP 129/75 | HR 69 | Temp 98.3°F | Resp 16 | Ht 67.0 in | Wt 224.0 lb

## 2023-05-07 DIAGNOSIS — J189 Pneumonia, unspecified organism: Secondary | ICD-10-CM | POA: Insufficient documentation

## 2023-05-07 MED ORDER — AZITHROMYCIN 250 MG PO TABS
ORAL_TABLET | ORAL | 0 refills | Status: DC
Start: 2023-05-07 — End: 2023-08-10

## 2023-05-07 NOTE — Patient Instructions (Signed)
It was a pleasure to see you today.  Thank you for giving [place] the opportunity to be involved in your care.  Below is a brief recap of your visit and next steps.  We will plan to see you again in December.  Summary Z pak prescribed today  Continue cough medication as needed Follow up in December but return to care next week if symptoms do not improve

## 2023-05-07 NOTE — Assessment & Plan Note (Signed)
Presenting today for an acute visit endorsing 5-day history of cough productive of yellow sputum as described above.  Left basilar rhonchi are present on exam today.  History and exam findings today are concerning for community-acquired pneumonia. -Z-Pak prescribed today -He was instructed to return to care if symptoms worsen or fail to improve by next week.  Otherwise, he will return for routine follow-up in December.

## 2023-05-07 NOTE — Progress Notes (Signed)
Acute Office Visit  Subjective:     Patient ID: [name], male    DOB: Mar 03, 1951, 72 y.o.   MRN: [id number]  Chief Complaint  Patient presents with   Cough    Started out like a cold Monday, coughing up clear to yellowish phlegm, burns in chest. Started to get better Wed but now getting worse, especially at night. Having some SOB    [name] presents today for an acute visit endorsing a cough with sputum production that began on Monday (9/30).  He states that symptoms transiently improved on Wednesday, but have worsened over the last 2 days.  He currently endorses a cough productive of yellow sputum.  He additionally endorses shortness of breath.  Denies fever/chills and sinus congestion.  Review of Systems  Constitutional:  Negative for chills, fever and malaise/fatigue.  Respiratory:  Positive for cough, sputum production and shortness of breath.   All other systems reviewed and are negative.    Objective:    BP 129/75   Pulse 69   Temp 98.3 F (36.8 C) (Oral)   Resp 16   Ht 5\' 7"  (1.702 m)   Wt 224 lb (101.6 kg)   SpO2 96%   BMI 35.08 kg/m   Physical Exam Vitals reviewed.  Constitutional:      Appearance: Normal appearance.  HENT:     Head: Normocephalic and atraumatic.     Nose: Nose normal. No congestion.     Mouth/Throat:     Mouth: Mucous membranes are moist.     Pharynx: Oropharynx is clear. No posterior oropharyngeal erythema.  Eyes:     Extraocular Movements: Extraocular movements intact.     Conjunctiva/sclera: Conjunctivae normal.     Pupils: Pupils are equal, round, and reactive to light.  Cardiovascular:     Rate and Rhythm: Normal rate and regular rhythm.     Pulses: Normal pulses.     Heart sounds: Murmur heard.  Pulmonary:     Breath sounds: Rhonchi (Left basilar rhonchi) present.  Abdominal:     General: Bowel sounds are normal.     Palpations: Abdomen is soft.  Skin:    General: Skin is warm and dry.  Neurological:     Mental  Status: He is alert.       Assessment & Plan:   Problem List Items Addressed This Visit       Community acquired pneumonia - Primary    Presenting today for an acute visit endorsing 5-day history of cough productive of yellow sputum as described above.  Left basilar rhonchi are present on exam today.  History and exam findings today are concerning for community-acquired pneumonia. -Z-Pak prescribed today -He was instructed to return to care if symptoms worsen or fail to improve by next week.  Otherwise, he will return for routine follow-up in December.      Meds ordered this encounter  Medications   azithromycin (ZITHROMAX Z-PAK) 250 MG tablet    Sig: Take 2 tablets (500 mg) PO today, then 1 tablet (250 mg) PO daily x4 days.    Dispense:  6 tablet    Refill:  0   Return if symptoms worsen or fail to improve.  Billie Lade, MD

## 2023-05-18 DIAGNOSIS — S0502XA Injury of conjunctiva and corneal abrasion without foreign body, left eye, initial encounter: Secondary | ICD-10-CM | POA: Diagnosis not present

## 2023-06-09 ENCOUNTER — Other Ambulatory Visit: Payer: Self-pay | Admitting: Cardiology

## 2023-06-09 DIAGNOSIS — I480 Paroxysmal atrial fibrillation: Secondary | ICD-10-CM

## 2023-06-09 NOTE — Telephone Encounter (Signed)
Prescription refill request for Xarelto received.  Indication: Afib  Last office visit: 09/03/22 ([name])  Weight: 101.6kg Age: 72 Scr: 1.00 (11/20/22)  CrCl: 93.65ml/min  Appropriate dose. Refill sent.

## 2023-06-09 NOTE — Telephone Encounter (Signed)
Xarelto refill

## 2023-06-10 ENCOUNTER — Telehealth: Payer: Self-pay | Admitting: Internal Medicine

## 2023-06-10 NOTE — Telephone Encounter (Signed)
Patient came by office said this medicine vortioxetine HBr (TRINTELLIX) 10 MG TABS tablet [[id number]]  needs some paperwork to be filled out said provider has the paperwork before for this medicine.  (shipped from  [facility] shipped out rx need  provider to do the paperwork )

## 2023-06-11 NOTE — Telephone Encounter (Signed)
Patient assisted printed, patient needs to sign  last three pages, then we will get Dr Durwin Nora to sign and fax

## 2023-06-11 NOTE — Telephone Encounter (Signed)
Contacted patient, will come into office 11.08.2024 to complete

## 2023-06-14 NOTE — Telephone Encounter (Signed)
Patient has completed and dropped form back off.

## 2023-07-08 DIAGNOSIS — H401131 Primary open-angle glaucoma, bilateral, mild stage: Secondary | ICD-10-CM | POA: Diagnosis not present

## 2023-07-22 ENCOUNTER — Encounter: Payer: Medicare HMO | Admitting: Internal Medicine

## 2023-07-27 ENCOUNTER — Encounter: Payer: Medicare HMO | Admitting: Internal Medicine

## 2023-08-09 DIAGNOSIS — M5451 Vertebrogenic low back pain: Secondary | ICD-10-CM | POA: Diagnosis not present

## 2023-08-09 DIAGNOSIS — R0781 Pleurodynia: Secondary | ICD-10-CM | POA: Diagnosis not present

## 2023-08-10 ENCOUNTER — Encounter: Payer: Self-pay | Admitting: Internal Medicine

## 2023-08-10 ENCOUNTER — Ambulatory Visit: Payer: Medicare Other | Admitting: Internal Medicine

## 2023-08-10 VITALS — BP 127/73 | HR 73 | Ht 67.0 in | Wt 231.4 lb

## 2023-08-10 DIAGNOSIS — S2242XD Multiple fractures of ribs, left side, subsequent encounter for fracture with routine healing: Secondary | ICD-10-CM | POA: Diagnosis not present

## 2023-08-10 DIAGNOSIS — F339 Major depressive disorder, recurrent, unspecified: Secondary | ICD-10-CM

## 2023-08-10 DIAGNOSIS — R7303 Prediabetes: Secondary | ICD-10-CM | POA: Diagnosis not present

## 2023-08-10 DIAGNOSIS — G4733 Obstructive sleep apnea (adult) (pediatric): Secondary | ICD-10-CM | POA: Diagnosis not present

## 2023-08-10 DIAGNOSIS — R195 Other fecal abnormalities: Secondary | ICD-10-CM

## 2023-08-10 DIAGNOSIS — E785 Hyperlipidemia, unspecified: Secondary | ICD-10-CM | POA: Diagnosis not present

## 2023-08-10 DIAGNOSIS — Z0001 Encounter for general adult medical examination with abnormal findings: Secondary | ICD-10-CM | POA: Diagnosis not present

## 2023-08-10 DIAGNOSIS — I480 Paroxysmal atrial fibrillation: Secondary | ICD-10-CM | POA: Diagnosis not present

## 2023-08-10 DIAGNOSIS — I10 Essential (primary) hypertension: Secondary | ICD-10-CM

## 2023-08-10 DIAGNOSIS — K219 Gastro-esophageal reflux disease without esophagitis: Secondary | ICD-10-CM

## 2023-08-10 DIAGNOSIS — E559 Vitamin D deficiency, unspecified: Secondary | ICD-10-CM | POA: Diagnosis not present

## 2023-08-10 DIAGNOSIS — G4700 Insomnia, unspecified: Secondary | ICD-10-CM | POA: Diagnosis not present

## 2023-08-10 NOTE — Assessment & Plan Note (Signed)
 His acute concerns today is changing stool consistency over the last month.  He states that he will have solid stools followed by loose stools and occasionally diarrhea.  He has not appreciated any blood in his stool.  We discussed increasing daily fiber supplementation and starting probiotics.  He will return to care if symptoms worsen or fail to improve.

## 2023-08-10 NOTE — Assessment & Plan Note (Signed)
A1c 6.0 on labs from September 2023.  Repeat A1c ordered today.

## 2023-08-10 NOTE — Assessment & Plan Note (Signed)
 Mood remains stable with Trintellix

## 2023-08-10 NOTE — Assessment & Plan Note (Signed)
Symptoms remain adequately controlled with Nexium.

## 2023-08-10 NOTE — Assessment & Plan Note (Signed)
 Annual physical completed today.  Previous records and labs reviewed. -Repeat labs ordered today -Due for second shingles vaccine.  Recommend that this be completed at his pharmacy. -We will tentatively plan for follow-up in 6 months

## 2023-08-10 NOTE — Patient Instructions (Signed)
 It was a pleasure to see you today.  Thank you for giving us  the opportunity to be involved in your care.  Below is a brief recap of your visit and next steps.  We will plan to see you again in 6 months.  Summary Annual physical completed today Repeat labs ordered No medication changes Follow up in 6 months

## 2023-08-10 NOTE — Assessment & Plan Note (Signed)
 Currently prescribed atorvastatin 40 mg daily.  Repeat lipid panel ordered today.

## 2023-08-10 NOTE — Progress Notes (Signed)
 Complete physical exam  Patient: [name]   DOB: [birth year]   73 y.o. Male  MRN: [id number]  Subjective:    Chief Complaint  Patient presents with   Annual Exam   Fall    Patient slipped on ice yesterday , was seen by [facility] does have broken ribs, also complaining of back and hip pain     [name] is a 73 y.o. male who presents today for a complete physical exam. He reports consuming a general diet. The patient does not participate in regular exercise at present. He generally feels well. He reports sleeping well. He does have additional problems to discuss today. He endorses changes in stool consistency for the last month.    Most recent fall risk assessment:    08/10/2023   10:21 AM  Fall Risk   Falls in the past year? 1  Number falls in past yr: 0  Injury with Fall? 1  Risk for fall due to : Other (Comment)  Risk for fall due to: Comment ice  Follow up Falls evaluation completed;Education provided;Falls prevention discussed     Most recent depression screenings:    08/10/2023   10:21 AM 01/22/2023   10:07 AM  PHQ 2/9 Scores  PHQ - 2 Score 0 0  PHQ- 9 Score  0    Vision:Within last year and Dental: No current dental problems and Receives regular dental care  Patient Active Problem List   Diagnosis Date Noted   Prediabetes 08/10/2023   Multiple fractures of ribs, left side, subsequent encounter for fracture with routine healing 08/10/2023   Obesity (BMI 30-39.9) 11/18/2022   Status post Nissen fundoplication 11/18/2022   History of DVT (deep vein thrombosis) 11/18/2022   GERD (gastroesophageal reflux disease) 11/18/2022   Bilateral recurrent inguinal hernias 11/18/2022   SBO (small bowel obstruction) (HCC) 11/17/2022   Acute otitis media 10/01/2022   Supraclavicular lymphadenopathy 10/01/2022   Encounter for well adult exam with abnormal findings 07/20/2022   BPPV (benign paroxysmal positional vertigo) 07/20/2022   Abnormal urine odor 07/20/2022    Epigastric burning sensation 05/21/2022   Left lower quadrant abdominal pain 05/21/2022   Dysphagia 05/21/2022   Change in consistency of stool 05/21/2022   Bloating 05/21/2022   Depression, recurrent (HCC) 04/28/2022   Chronic musculoskeletal pain 04/28/2022   Preventative health care 04/28/2022   Insomnia 09/15/2021   Lumbar spinal stenosis 07/24/2021   Nonocclusive coronary atherosclerosis of native coronary artery 10/08/2019   Former smoker 09/26/2019   Cough 09/28/2018   Nasal congestion 09/28/2018   Paroxysmal atrial fibrillation (HCC) 08/25/2018   Cellulitis 07/18/2018   Essential hypertension 07/18/2018   Hyperlipidemia 07/18/2018   Allergic reaction 07/18/2018   H/O total shoulder replacement, left 07/15/2018   S/P shoulder replacement, right 01/28/2018   Robotic XI TAPP  bilateral inguinal hernia repair Dec 2017 07/31/2016   S/P bilateral inguinal herniorrhaphy 07/31/2016   Lumbar post-laminectomy syndrome    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/17/2013   OA (osteoarthritis) of knee 11/17/2013   OSA (obstructive sleep apnea) 12/05/2012   Dyspepsia 12/23/2007   RLQ PAIN 12/23/2007   History of colonic polyps 12/23/2007   Past Medical History:  Diagnosis Date   Allergy 2005   Arthritis    oa and ddd   Back pain, chronic    pt states he has 3 herniated disks--uses fentanyl  patch for pain   Blood transfusion without reported diagnosis 1950   rH negativeat birth only   BPH (  benign prostatic hyperplasia)    frequent urination, not able to hold urine well   Cancer (HCC)    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   Cataracts, both eyes    worst on left   Colon polyps    Complication of anesthesia    pt had spinal for a knee replacement--woke up during surgery--and sick after surgery   Depression    Diverticulosis    DVT (deep venous thrombosis) (HCC) 2001 or 2002   right leg-required hospitalization x 8 days   DVT (deep venous thrombosis) (HCC) 1996   right    GERD (gastroesophageal reflux disease) 11/18/2022   Glaucoma    both eyes   H/O hiatal hernia    Hearing impaired person, bilateral    bilateral   Hemorrhoid    History of IBS    History of kidney stones    x 1-passed   Hyperlipidemia 2012   Hypertension    Lumbago    Lumbar post-laminectomy syndrome    Myocardial infarction [facility]) December 2019   PAF (paroxysmal atrial fibrillation) (HCC)    PONV (postoperative nausea and vomiting)    Sleep apnea    pt uses cpap - setting of 16   Ulcer 1966   patient denies   Ulcerative colitis (HCC)    patient denies   Wears glasses    Wears hearing aid    Past Surgical History:  Procedure Laterality Date   APPENDECTOMY  1993   BACK SURGERY  2008   bone spur removed    BALLOON DILATION N/A 06/18/2022   Procedure: BALLOON DILATION;  Surgeon: Cindie Carlin POUR, DO;  [facility];  Service: Endoscopy;  Laterality: N/A;   bilateral carpal tunnel release 2009     BIOPSY  06/18/2022   Procedure: BIOPSY;  Surgeon: Cindie Carlin POUR, DO;  [facility];  Service: Endoscopy;;   cervical fusion 2011--pt has good range of motion neck     CHOLECYSTECTOMY  1994   COLONOSCOPY WITH PROPOFOL  N/A 06/18/2022   Procedure: COLONOSCOPY WITH PROPOFOL ;  Surgeon: Cindie Carlin POUR, DO;  [facility];  Service: Endoscopy;  Laterality: N/A;  8:15 AM   ESOPHAGOGASTRODUODENOSCOPY (EGD) WITH PROPOFOL  N/A 06/18/2022   Procedure: ESOPHAGOGASTRODUODENOSCOPY (EGD) WITH PROPOFOL ;  Surgeon: Cindie Carlin POUR, DO;  [facility];  Service: Endoscopy;  Laterality: N/A;   EYE SURGERY N/A    Phreesia 07/13/2020   HERNIA REPAIR  2008   INSERTION OF MESH Bilateral 07/31/2016   Procedure: INSERTION OF MESH;  Surgeon: Donnice Lunger, MD;  [facility]: [facility];  Service: General;  Laterality: Bilateral;   JOINT REPLACEMENT Bilateral    2001 left total knee and 1985 right total knee   LAPAROSCOPY N/A 11/19/2022   Procedure: LAPAROSCOPY  DIAGNOSTIC;  Surgeon: Vernetta Berg, MD;  Location[facility];  Service: General;  Laterality: N/A;   LUMBAR LAMINECTOMY/DECOMPRESSION MICRODISCECTOMY N/A 07/24/2021   Procedure: DECOMPRESSION AND REMOVAL OF CYST LUMBAR FIVE THROUGH SACRAL ONE;  Surgeon: Burnetta Aures, MD;  Location: [facility];  Service: Orthopedics;  Laterality: N/A;   LYSIS OF ADHESION  11/19/2022   Procedure: LAPAROSCOPIC LYSIS OF ADHESION;  Surgeon: Vernetta Berg, MD;  Location: [facility];  Service: General;;   MASS EXCISION Left 01/04/2014   Procedure: LEFT MIDDLE FINGER MASS EXCISION WITH NAILBED REPAIR AND ADVANCEMENT FLAP;  Surgeon: Elsie Mussel, MD;  Location: [facility];  Service: Orthopedics;  Laterality: Left;   mass removed from 2nd finger Left 03/2013  squamous carcinoma   nissen fundoplication 1995     POLYPECTOMY  06/18/2022   Procedure: POLYPECTOMY;  Surgeon: Cindie Carlin POUR, DO;  [facility];  Service: Endoscopy;;   PROSTATE SURGERY  2014   TURP   right wrist fusion 12/12 - pt wearing brace on the wrist-not started physical therapy yet Right    fusion prevent limited ROM to right wrist   SPINE SURGERY  2009   cervical spine fusion   thyroid  nodule     removed   TOTAL KNEE REVISION Right 11/17/2013   Procedure: RIGHT KNEE POLYETHYLENE REVISION, bone graft;  Surgeon: Dempsey LULLA Moan, MD;  [facility];  Service: Orthopedics;  Laterality: Right;   TOTAL SHOULDER ARTHROPLASTY Right 01/28/2018   Procedure: RIGHT TOTAL SHOULDER ARTHROPLASTY;  Surgeon: Kay Kemps, MD;  Location: [facility] OR;  Service: Orthopedics;  Laterality: Right;   TOTAL SHOULDER ARTHROPLASTY Left 07/15/2018   Procedure: LEFT SHOULDER ANATOMIC TOTAL SHOULDER ARTHROPLASTY;  Surgeon: Kay Kemps, MD;  Location: [facility] OR;  Service: Orthopedics;  Laterality: Left;   TRANSURETHRAL RESECTION OF PROSTATE  10/09/2011   Procedure: TRANSURETHRAL RESECTION OF THE PROSTATE WITH GYRUS INSTRUMENTS;  Surgeon: Donnice Gwenyth Brooks, MD;  [facility];  Service: Urology;  Laterality: N/A;   uvvvp     XI ROBOTIC ASSISTED INGUINAL HERNIA REPAIR WITH MESH Bilateral 07/31/2016   Procedure: XI ROBOTIC BILATERAL INGUINAL HERNIA REPAIR WITH MESH;  Surgeon: Donnice Lunger, MD;  [facility];  Service: General;  Laterality: Bilateral;   Social History   Tobacco Use   Smoking status: Former    Current packs/day: 0.00    Average packs/day: 1 pack/day for 15.0 years (15.0 ttl pk-yrs)    Types: Cigarettes    Start date: 08/03/1968    Quit date: 08/04/1983    Years since quitting: 40.0   Smokeless tobacco: Never   Tobacco comments:    quit smoking 1985  Vaping Use   Vaping status: Never Used  Substance Use Topics   Alcohol  use: Yes    Alcohol /week: 3.0 standard drinks of alcohol     Types: 3 Cans of beer per week    Comment: 4 beers a week   Drug use: No   Family History  Problem Relation Age of Onset   Arthritis Mother    Hearing loss Mother    Hyperlipidemia Mother    Hypertension Mother    Deep vein thrombosis Mother    Bone cancer Father        deceased   Cancer Father    Diabetes Father    Prostate cancer Father    Ulcerative colitis Father    Diabetes Sister    Hearing loss Maternal Grandmother    Hearing loss Maternal Grandfather    Diabetes Paternal Grandfather    Allergies  Allergen Reactions   Latex Rash and Other (See Comments)    CONDOM CATHETER- SEVERE REACTION!!!!!   Adhesive [Tape] Other (See Comments)    Causes blisters - pls use paper tape   Patient Care Team: Melvenia Manus BRAVO, MD as PCP - General (Internal Medicine) Lonni Slain, MD as PCP - Cardiology (Cardiology) Nicholaus Sherlean CROME, [facility] (Inactive) as Pharmacist (Pharmacist)   Outpatient Medications Prior to Visit  Medication Sig   acetaminophen  (TYLENOL ) 500 MG tablet Take 500 mg by mouth every 6 (six) hours as needed for mild pain or headache.   albuterol  (VENTOLIN  HFA) 108 (90 Base) MCG/ACT inhaler Inhale  2 puffs into the lungs every 4 (  four) hours as needed for wheezing or shortness of breath.   atorvastatin  (LIPITOR) 40 MG tablet TAKE 1 TABLET(40 MG) BY MOUTH DAILY (Patient taking differently: Take 40 mg by mouth at bedtime.)   Calcium  Carb-Cholecalciferol (CALCIUM  + D3 PO) Take 1 tablet by mouth at bedtime.   diphenhydrAMINE  (BENADRYL ) 25 mg capsule Take 25 mg by mouth daily as needed for allergies.   esomeprazole  (NEXIUM ) 20 MG capsule Take 1 capsule (20 mg total) by mouth daily at 12 noon.   fluticasone  (FLONASE ) 50 MCG/ACT nasal spray Place 1 spray into both nostrils at bedtime as needed for allergies.   hydroxypropyl methylcellulose / hypromellose (ISOPTO TEARS / GONIOVISC) 2.5 % ophthalmic solution Place 1 drop into both eyes 3 (three) times daily as needed for dry eyes.   latanoprost  (XALATAN ) 0.005 % ophthalmic solution Place 1 drop into both eyes at bedtime.   meclizine  (ANTIVERT ) 25 MG tablet TAKE 1 TABLET(25 MG) BY MOUTH THREE TIMES DAILY AS NEEDED FOR DIZZINESS   metoprolol  tartrate (LOPRESSOR ) 25 MG tablet Take 25 mg by mouth See admin instructions. Take 25 mg by mouth one to three times a day when in A-Fib (call 9-1-1 if no relief after three doses)   Multiple Vitamin (MULTIVITAMIN WITH MINERALS) TABS tablet Take 1 tablet by mouth at bedtime.   OVER THE COUNTER MEDICATION Take 1-2 tablets by mouth See admin instructions. Tylenol  for Tension headaches- Take 1-2 tablets by mouth every 6 hours as needed for headaches   PRESCRIPTION MEDICATION Inhale into the lungs See admin instructions. CPAP- At bedtime   sodium chloride  (OCEAN) 0.65 % SOLN nasal spray Place 1 spray into both nostrils as needed for congestion.   traZODone  (DESYREL ) 50 MG tablet Take 100 mg by mouth at bedtime.   vortioxetine  HBr (TRINTELLIX ) 10 MG TABS tablet Take 1 tablet (10 mg total) by mouth at bedtime.   XARELTO  20 MG TABS tablet TAKE 1 TABLET BY MOUTH DAILY WITH SUPPER   [DISCONTINUED] azithromycin  (ZITHROMAX   Z-PAK) 250 MG tablet Take 2 tablets (500 mg) PO today, then 1 tablet (250 mg) PO daily x4 days.   No facility-administered medications prior to visit.   Review of Systems  Constitutional:  Negative for chills and fever.  HENT:  Negative for sore throat.   Respiratory:  Negative for cough and shortness of breath.   Cardiovascular:  Negative for chest pain, palpitations and leg swelling.  Gastrointestinal:  Negative for abdominal pain, blood in stool, constipation, diarrhea, nausea and vomiting.       Changes in stool consistency x 1 month  Genitourinary:  Negative for dysuria and hematuria.  Musculoskeletal:  Positive for back pain (Left rib pain). Negative for myalgias.  Skin:  Negative for itching and rash.  Neurological:  Negative for dizziness and headaches.  Psychiatric/Behavioral:  Negative for depression and suicidal ideas.       Objective:     BP 127/73 (BP Location: Left Arm, Patient Position: Sitting, Cuff Size: Large)   Pulse 73   Ht 5' 7 (1.702 m)   Wt 231 lb 6.4 oz (105 kg)   SpO2 96%   BMI 36.24 kg/m  BP Readings from Last 3 Encounters:  08/10/23 127/73  05/07/23 129/75  04/22/23 130/78   Wt Readings from Last 3 Encounters:  08/10/23 231 lb 6.4 oz (105 kg)  05/07/23 224 lb (101.6 kg)  04/22/23 222 lb (100.7 kg)   Physical Exam Vitals reviewed.  Constitutional:      General: He is not  in acute distress.    Appearance: Normal appearance. He is obese. He is not ill-appearing.  HENT:     Head: Normocephalic and atraumatic.     Right Ear: External ear normal.     Left Ear: External ear normal.     Nose: Nose normal. No congestion or rhinorrhea.     Mouth/Throat:     Mouth: Mucous membranes are moist.     Pharynx: Oropharynx is clear.  Eyes:     General: No scleral icterus.    Extraocular Movements: Extraocular movements intact.     Conjunctiva/sclera: Conjunctivae normal.     Pupils: Pupils are equal, round, and reactive to light.  Cardiovascular:      Rate and Rhythm: Normal rate and regular rhythm.     Pulses: Normal pulses.     Heart sounds: Murmur heard.  Pulmonary:     Effort: Pulmonary effort is normal.     Breath sounds: Normal breath sounds. No wheezing, rhonchi or rales.  Abdominal:     General: Abdomen is flat. Bowel sounds are normal. There is no distension.     Palpations: Abdomen is soft.     Tenderness: There is no abdominal tenderness.  Musculoskeletal:        General: No swelling or deformity. Normal range of motion.     Cervical back: Normal range of motion.  Skin:    General: Skin is warm and dry.     Capillary Refill: Capillary refill takes less than 2 seconds.  Neurological:     General: No focal deficit present.     Mental Status: He is alert and oriented to person, place, and time.     Motor: No weakness.  Psychiatric:        Mood and Affect: Mood normal.        Behavior: Behavior normal.        Thought Content: Thought content normal.   Last CBC Lab Results  Component Value Date   WBC 8.8 11/20/2022   HGB 14.9 11/20/2022   HCT 41.2 11/20/2022   MCV 88.8 11/20/2022   MCH 32.1 11/20/2022   RDW 12.6 11/20/2022   PLT 157 11/20/2022   Last metabolic panel Lab Results  Component Value Date   GLUCOSE 110 (H) 11/20/2022   NA 139 11/20/2022   K 3.8 11/20/2022   CL 103 11/20/2022   CO2 28 11/20/2022   BUN 22 11/20/2022   CREATININE 1.00 11/20/2022   GFRNONAA >60 11/20/2022   CALCIUM  8.9 11/20/2022   PHOS 2.1 (L) 07/19/2018   PROT 6.2 (L) 11/20/2022   ALBUMIN 3.4 (L) 11/20/2022   LABGLOB 2.4 03/09/2022   AGRATIO 1.9 03/09/2022   BILITOT 1.0 11/20/2022   ALKPHOS 58 11/20/2022   AST 27 11/20/2022   ALT 22 11/20/2022   ANIONGAP 8 11/20/2022   Last lipids Lab Results  Component Value Date   CHOL 129 04/27/2022   HDL 49 04/27/2022   LDLCALC 63 04/27/2022   TRIG 86 04/27/2022   CHOLHDL 2.6 04/27/2022   Last hemoglobin A1c Lab Results  Component Value Date   HGBA1C 6.0 (H) 04/27/2022    Last thyroid  functions Lab Results  Component Value Date   TSH 3.90 07/11/2020   Last vitamin D  Lab Results  Component Value Date   VD25OH 85.9 04/27/2022   Last vitamin B12 and Folate Lab Results  Component Value Date   VITAMINB12 492 04/27/2022   FOLATE 16.3 04/27/2022      Assessment & Plan:  Routine Health Maintenance and Physical Exam  Immunization History  Administered Date(s) Administered   Fluad Quad(high Dose 65+) 04/06/2019, 05/01/2020, 04/24/2021, 04/26/2022, 04/07/2023   Influenza Split 07/04/2012, 03/03/2013   Influenza,inj,Quad PF,6+ Mos 05/03/2014, 04/30/2015, 04/03/2016, 04/08/2017, 04/11/2018   Influenza,inj,quad, With Preservative 05/12/2017   Moderna Covid-19 Fall Seasonal Vaccine 72yrs & older 04/07/2023   Moderna Sars-Covid-2 Vaccination 09/14/2019, 06/06/2020   PFIZER(Purple Top)SARS-COV-2 Vaccination 10/13/2019   Pfizer Covid-19 Vaccine Bivalent Booster 56yrs & up 08/11/2021   Pneumococcal Conjugate-13 05/03/2014   Pneumococcal Polysaccharide-23 08/03/2008, 04/03/2016   Tdap 08/03/2010, 08/11/2021   Zoster Recombinant(Shingrix) 08/11/2021   Zoster, Live 08/03/2010    Health Maintenance  Topic Date Due   Zoster Vaccines- Shingrix (2 of 2) 10/06/2021   Medicare Annual Wellness (AWV)  08/05/2023   DTaP/Tdap/Td (3 - Td or Tdap) 08/12/2031   Colonoscopy  06/18/2032   Pneumonia Vaccine 79+ Years old  Completed   INFLUENZA VACCINE  Completed   COVID-19 Vaccine  Completed   Hepatitis C Screening  Completed   HPV VACCINES  Aged Out    Discussed health benefits of physical activity, and encouraged him to engage in regular exercise appropriate for his age and condition.  Problem List Items Addressed This Visit       Paroxysmal atrial fibrillation (HCC)   He remains on Xarelto .  Regular rate and rhythm detected on exam today.      OSA (obstructive sleep apnea)   He continues to endorse nightly compliance with CPAP      GERD  (gastroesophageal reflux disease)   Symptoms remain adequately controlled with Nexium .      Multiple fractures of ribs, left side, subsequent encounter for fracture with routine healing   He reports a fall yesterday (1/6) while walking down his stairs, landing on his left side and fracturing multiple ribs.  He was evaluated by orthopedic surgery.  Pain is adequately controlled.  The importance of practicing deep breathing exercises was reviewed today.      Hyperlipidemia   Currently prescribed atorvastatin  40 mg daily.  Repeat lipid panel ordered today.      Insomnia   Adequately treated with nightly trazodone .      Depression, recurrent (HCC)   Mood remains stable with Trintellix       Change in consistency of stool   His acute concerns today is changing stool consistency over the last month.  He states that he will have solid stools followed by loose stools and occasionally diarrhea.  He has not appreciated any blood in his stool.  We discussed increasing daily fiber supplementation and starting probiotics.  He will return to care if symptoms worsen or fail to improve.      Encounter for well adult exam with abnormal findings - Primary   Annual physical completed today.  Previous records and labs reviewed. -Repeat labs ordered today -Due for second shingles vaccine.  Recommend that this be completed at his pharmacy. -We will tentatively plan for follow-up in 6 months      Prediabetes   A1c 6.0 on labs from September 2023.  Repeat A1c ordered today.      Return in about 6 months (around 02/07/2024).  Manus FORBES Fireman, MD

## 2023-08-10 NOTE — Assessment & Plan Note (Signed)
 Adequately treated with nightly trazodone.

## 2023-08-10 NOTE — Assessment & Plan Note (Signed)
He continues to endorse nightly compliance with CPAP. 

## 2023-08-10 NOTE — Assessment & Plan Note (Addendum)
 He reports a fall yesterday (1/6) while walking down his stairs, landing on his left side and fracturing multiple ribs.  He was evaluated by orthopedic surgery.  Pain is adequately controlled.  The importance of practicing deep breathing exercises was reviewed today.

## 2023-08-10 NOTE — Assessment & Plan Note (Signed)
 He remains on Xarelto.  Regular rate and rhythm detected on exam today.

## 2023-08-11 LAB — CBC WITH DIFFERENTIAL/PLATELET
Basophils Absolute: 0.1 10*3/uL (ref 0.0–0.2)
Basos: 1 %
EOS (ABSOLUTE): 0.2 10*3/uL (ref 0.0–0.4)
Eos: 2 %
Hematocrit: 46.6 % (ref 37.5–51.0)
Hemoglobin: 15.9 g/dL (ref 13.0–17.7)
Immature Grans (Abs): 0 10*3/uL (ref 0.0–0.1)
Immature Granulocytes: 0 %
Lymphocytes Absolute: 1.9 10*3/uL (ref 0.7–3.1)
Lymphs: 24 %
MCH: 32.6 pg (ref 26.6–33.0)
MCHC: 34.1 g/dL (ref 31.5–35.7)
MCV: 96 fL (ref 79–97)
Monocytes Absolute: 0.9 10*3/uL (ref 0.1–0.9)
Monocytes: 11 %
Neutrophils Absolute: 4.9 10*3/uL (ref 1.4–7.0)
Neutrophils: 62 %
Platelets: 197 10*3/uL (ref 150–450)
RBC: 4.88 x10E6/uL (ref 4.14–5.80)
RDW: 12.5 % (ref 11.6–15.4)
WBC: 8 10*3/uL (ref 3.4–10.8)

## 2023-08-11 LAB — CMP14+EGFR
ALT: 30 [IU]/L (ref 0–44)
AST: 28 [IU]/L (ref 0–40)
Albumin: 4.8 g/dL (ref 3.8–4.8)
Alkaline Phosphatase: 89 [IU]/L (ref 44–121)
BUN/Creatinine Ratio: 18 (ref 10–24)
BUN: 20 mg/dL (ref 8–27)
Bilirubin Total: 0.5 mg/dL (ref 0.0–1.2)
CO2: 25 mmol/L (ref 20–29)
Calcium: 9.5 mg/dL (ref 8.6–10.2)
Chloride: 103 mmol/L (ref 96–106)
Creatinine, Ser: 1.13 mg/dL (ref 0.76–1.27)
Globulin, Total: 2.4 g/dL (ref 1.5–4.5)
Glucose: 106 mg/dL — ABNORMAL HIGH (ref 70–99)
Potassium: 4.1 mmol/L (ref 3.5–5.2)
Sodium: 141 mmol/L (ref 134–144)
Total Protein: 7.2 g/dL (ref 6.0–8.5)
eGFR: 68 mL/min/{1.73_m2} (ref >59)

## 2023-08-11 LAB — VITAMIN D 25 HYDROXY (VIT D DEFICIENCY, FRACTURES): Vit D, 25-Hydroxy: 68.5 ng/mL (ref 30.0–100.0)

## 2023-08-11 LAB — HEMOGLOBIN A1C
Est. average glucose Bld gHb Est-mCnc: 114 mg/dL
Hgb A1c MFr Bld: 5.6 % (ref 4.8–5.6)

## 2023-08-11 LAB — B12 AND FOLATE PANEL
Folate: 12.3 ng/mL (ref >3.0)
Vitamin B-12: 560 pg/mL (ref 232–1245)

## 2023-08-11 LAB — LIPID PANEL
Chol/HDL Ratio: 3.2 {ratio} (ref 0.0–5.0)
Cholesterol, Total: 154 mg/dL (ref 100–199)
HDL: 48 mg/dL (ref >39)
LDL Chol Calc (NIH): 81 mg/dL (ref 0–99)
Triglycerides: 141 mg/dL (ref 0–149)
VLDL Cholesterol Cal: 25 mg/dL (ref 5–40)

## 2023-08-11 LAB — TSH+FREE T4
Free T4: 0.9 ng/dL (ref 0.82–1.77)
TSH: 3.43 u[IU]/mL (ref 0.450–4.500)

## 2023-08-12 DIAGNOSIS — M961 Postlaminectomy syndrome, not elsewhere classified: Secondary | ICD-10-CM | POA: Diagnosis not present

## 2023-08-28 NOTE — Progress Notes (Signed)
Cardiology Office Note:  .   Date:  08/30/2023  ID:  Council Mechanic, DOB October 12, 1950, MRN [id number] PCP: Billie Lade, MD  [facility] Providers Cardiologist:  Jodelle Red, MD {  History of Present Illness: .   [name] is a 73 y.o. male with a hx of nonobstructive CAD, hypertension, hyperlipidemia, prior atrial fibrillation with RVR, history of GERD s/p Nissen fundiplication and SBO 2024 s/p lysis of adhesions who is seen in follow up for the evaluation and management of afib, hypertension, hyperlipidemia   Pertinent CV history: hospitalized in 07/2018 due to complications from urinary catheter placed during recent shoulder surgery, but he was also found to have new onset atrial fibrillation with RVR. He spontaneously converted to NSR. He was discharged on rivaroxaban. Coronary CT 2020 minimal nonobstructive CAD, Ca score 236 (59th %ile). Monitor 2019 with afib. Echo 2018 EF 60-65%, G2DD, no significant valve disease.  Today: Feel down the steps 2 weeks ago, broke a rib. Had X rays. Trying to avoid coughing, managing with pain management. Otherwise stays very active. Splits wood, stays very active. No bleeding issues. Checks rhythm with Lourena Simmonds, has not caught any afib.  Just had labs with PCP, reviewed today.  ROS: Denies other chest pain, shortness of breath at rest or with normal exertion. No PND, orthopnea, LE edema or unexpected weight gain. No syncope or palpitations. ROS otherwise negative except as noted.   Studies Reviewed: Marland Kitchen    EKG:       Physical Exam:   VS:  BP 132/60   Pulse 75   Ht 5\' 7"  (1.702 m)   Wt 230 lb 6.4 oz (104.5 kg)   SpO2 96%   BMI 36.09 kg/m    Wt Readings from Last 3 Encounters:  08/30/23 230 lb 6.4 oz (104.5 kg)  08/10/23 231 lb 6.4 oz (105 kg)  05/07/23 224 lb (101.6 kg)    GEN: Well nourished, well developed in no acute distress HEENT: Normal, moist mucous membranes NECK: No JVD CARDIAC: regular rhythm, normal S1 and  S2, no rubs or gallops. No murmur. VASCULAR: Radial and DP pulses 2+ bilaterally. No carotid bruits RESPIRATORY:  Clear to auscultation without rales, wheezing or rhonchi  ABDOMEN: Soft, non-tender, non-distended MUSCULOSKELETAL:  Ambulates independently SKIN: Warm and dry, no edema NEUROLOGIC:  Alert and oriented x 3. No focal neuro deficits noted. PSYCHIATRIC:  Normal affect    ASSESSMENT AND PLAN: .    Nonobstructive CAD Hypercholesterolemia -on atorvastatin 40 mg daily -LDL goal <70 with nonobstructive CAD. Most recent 56, was previously well controlled. Discussed options, including increasing atorvastatin, adding ezetimibe, working on lifestyle. After shared decision making, he wants to work on lifestyle/diet/weight loss, recheck lipids in 6 mos -no aspirin as he is on rivaroxaban -counseled on red flag warning signs that need immediate medical attention   Atrial fibrillation, with RVR in hospital and spontaneous conversion: -CHA2DS2/VAS Stroke Risk Points=3 (HTN, HLD, age)  -on rivaroxaban for anticoagulation. Tolerating well. -regular rhythm today -asymptomatic -has instructions for PRN metoprolol   Hypertension, history of:  -near goal of <130/80 on no medications -if elevates above goal in the future, did well on lisinopril, would restart  CV risk counseling and prevention -recommend heart healthy/Mediterranean diet, with whole grains, fruits, vegetable, fish, lean meats, nuts, and olive oil. Limit salt. -recommend moderate walking, 3-5 times/week for 30-50 minutes each session. Aim for at least 150 minutes.week. Goal should be pace of 3 miles/hours, or walking 1.5 miles in  30 minutes -recommend avoidance of tobacco products. Avoid excess alcohol.  Dispo: 1 year or sooner as needed  Signed, Jodelle Red, MD   Jodelle Red, MD, PhD, [facility]  [facility]  [place]  [facility] at [facility] at [facility] [address], [address] [place], [place] [address] [phone]

## 2023-08-30 ENCOUNTER — Encounter (HOSPITAL_BASED_OUTPATIENT_CLINIC_OR_DEPARTMENT_OTHER): Payer: Self-pay | Admitting: Cardiology

## 2023-08-30 ENCOUNTER — Ambulatory Visit (HOSPITAL_BASED_OUTPATIENT_CLINIC_OR_DEPARTMENT_OTHER): Payer: Medicare Other | Admitting: Cardiology

## 2023-08-30 VITALS — BP 132/60 | HR 75 | Ht 67.0 in | Wt 230.4 lb

## 2023-08-30 DIAGNOSIS — I480 Paroxysmal atrial fibrillation: Secondary | ICD-10-CM

## 2023-08-30 DIAGNOSIS — Z7189 Other specified counseling: Secondary | ICD-10-CM

## 2023-08-30 DIAGNOSIS — E7800 Pure hypercholesterolemia, unspecified: Secondary | ICD-10-CM

## 2023-08-30 DIAGNOSIS — I2510 Atherosclerotic heart disease of native coronary artery without angina pectoris: Secondary | ICD-10-CM | POA: Diagnosis not present

## 2023-08-30 NOTE — Patient Instructions (Signed)
Medication Instructions:  Your physician recommends that you continue on your current medications as directed. Please refer to the Current Medication list given to you today.  *If you need a refill on your cardiac medications before your next appointment, please call your pharmacy*   Lab Work: Your physician recommends that you return for lab work in 6 months: LIPIDS  If you have labs (blood work) drawn today and your tests are completely normal, you will receive your results only by: MyChart Message (if you have MyChart) OR A paper copy in the mail If you have any lab test that is abnormal or we need to change your treatment, we will call you to review the results.   Follow-Up: At [facility], you and your health needs are our priority.  As part of our continuing mission to provide you with exceptional heart care, we have created designated Provider Care Teams.  These Care Teams include your primary Cardiologist (physician) and Advanced Practice Providers (APPs -  Physician Assistants and Nurse Practitioners) who all work together to provide you with the care you need, when you need it.  We recommend signing up for the patient portal called "MyChart".  Sign up information is provided on this After Visit Summary.  MyChart is used to connect with patients for Virtual Visits (Telemedicine).  Patients are able to view lab/test results, encounter notes, upcoming appointments, etc.  Non-urgent messages can be sent to your provider as well.   To learn more about what you can do with MyChart, go to [email or web address].    Your next appointment:   1 year(s)  Provider:   Jodelle Red, MD

## 2023-09-08 ENCOUNTER — Ambulatory Visit (INDEPENDENT_AMBULATORY_CARE_PROVIDER_SITE_OTHER): Payer: Medicare Other

## 2023-09-08 VITALS — Ht 67.0 in | Wt 226.0 lb

## 2023-09-08 DIAGNOSIS — Z0000 Encounter for general adult medical examination without abnormal findings: Secondary | ICD-10-CM | POA: Diagnosis not present

## 2023-09-08 NOTE — Patient Instructions (Addendum)
 [name] , Thank you for taking time to come for your Medicare Wellness Visit. I appreciate your ongoing commitment to your health goals. Please review the following plan we discussed and let me know if I can assist you in the future.   Referrals/Orders/Follow-Ups/Clinician Recommendations: Aim for 30 minutes of exercise or brisk walking, 6-8 glasses of water , and 5 servings of fruits and vegetables each day. Encouraged to get 2nd Shingles vaccine.  Advance Directives will be mailed to the patient for review and completion.    This is a list of the screening recommended for you and due dates:  Health Maintenance  Topic Date Due   Zoster (Shingles) Vaccine (2 of 2) 10/06/2021   Medicare Annual Wellness Visit  09/07/2024   DTaP/Tdap/Td vaccine (3 - Td or Tdap) 08/12/2031   Colon Cancer Screening  06/18/2032   Pneumonia Vaccine  Completed   Flu Shot  Completed   COVID-19 Vaccine  Completed   Hepatitis C Screening  Completed   HPV Vaccine  Aged Out    Advanced directives: (Provided) Advance directive discussed with you today. I have provided a copy for you to complete at home and have notarized. Once this is complete, please bring a copy in to our office so we can scan it into your chart.   Next Medicare Annual Wellness Visit scheduled for next year: Yes - 09/11/2024

## 2023-09-08 NOTE — Progress Notes (Signed)
 Subjective:   [name] is a 73 y.o. male who presents for Medicare Annual/Subsequent preventive examination.  Visit Complete: Virtual I connected with  [name] on 09/08/23 by a audio enabled telemedicine application and verified that I am speaking with the correct person using two identifiers.  Patient Location: Home  Provider Location: Office/Clinic  I discussed the limitations of evaluation and management by telemedicine. The patient expressed understanding and agreed to proceed.  Vital Signs: Because this visit was a virtual/telehealth visit, some criteria may be missing or patient reported. Any vitals not documented were not able to be obtained and vitals that have been documented are patient reported.  Patient Medicare AWV questionnaire was completed by the patient on 08/07/2023; I have confirmed that all information answered by patient is correct and no changes since this date.  Cardiac Risk Factors include: advanced age (>66men, >81 women);obesity (BMI >30kg/m2);hypertension;male gender;dyslipidemia     Objective:    Today's Vitals   09/08/23 1440  Weight: 226 lb (102.5 kg)  Height: 5' 7 (1.702 m)   Body mass index is 35.4 kg/m.     09/08/2023    2:37 PM 11/19/2022    9:00 AM 11/17/2022   11:17 PM 11/17/2022    3:19 PM 09/18/2022    1:47 PM 06/18/2022    6:53 AM 06/16/2022   11:55 AM  Advanced Directives  Does Patient Have a Medical Advance Directive? No Yes No No No No No  Would patient like information on creating a medical advance directive? No - Patient declined  No - Patient declined   No - Patient declined No - Patient declined    Current Medications (verified) Outpatient Encounter Medications as of 09/08/2023  Medication Sig   acetaminophen  (TYLENOL ) 500 MG tablet Take 500 mg by mouth every 6 (six) hours as needed for mild pain or headache.   albuterol  (VENTOLIN  HFA) 108 (90 Base) MCG/ACT inhaler Inhale 2 puffs into the lungs every 4 (four) hours as  needed for wheezing or shortness of breath.   atorvastatin  (LIPITOR) 40 MG tablet TAKE 1 TABLET(40 MG) BY MOUTH DAILY (Patient taking differently: Take 40 mg by mouth at bedtime.)   Calcium  Carb-Cholecalciferol (CALCIUM  + D3 PO) Take 1 tablet by mouth at bedtime.   diphenhydrAMINE  (BENADRYL ) 25 mg capsule Take 25 mg by mouth daily as needed for allergies.   esomeprazole  (NEXIUM ) 20 MG capsule Take 1 capsule (20 mg total) by mouth daily at 12 noon.   fluticasone  (FLONASE ) 50 MCG/ACT nasal spray Place 1 spray into both nostrils at bedtime as needed for allergies.   hydroxypropyl methylcellulose / hypromellose (ISOPTO TEARS / GONIOVISC) 2.5 % ophthalmic solution Place 1 drop into both eyes 3 (three) times daily as needed for dry eyes.   latanoprost  (XALATAN ) 0.005 % ophthalmic solution Place 1 drop into both eyes at bedtime.   meclizine  (ANTIVERT ) 25 MG tablet TAKE 1 TABLET(25 MG) BY MOUTH THREE TIMES DAILY AS NEEDED FOR DIZZINESS   metoprolol  tartrate (LOPRESSOR ) 25 MG tablet Take 25 mg by mouth See admin instructions. Take 25 mg by mouth one to three times a day when in A-Fib (call 9-1-1 if no relief after three doses)   Multiple Vitamin (MULTIVITAMIN WITH MINERALS) TABS tablet Take 1 tablet by mouth at bedtime.   OVER THE COUNTER MEDICATION Take 1-2 tablets by mouth See admin instructions. Tylenol  for Tension headaches- Take 1-2 tablets by mouth every 6 hours as needed for headaches   PRESCRIPTION MEDICATION Inhale into  the lungs See admin instructions. CPAP- At bedtime   sodium chloride  (OCEAN) 0.65 % SOLN nasal spray Place 1 spray into both nostrils as needed for congestion.   traZODone  (DESYREL ) 50 MG tablet Take 100 mg by mouth at bedtime.   vortioxetine  HBr (TRINTELLIX ) 10 MG TABS tablet Take 1 tablet (10 mg total) by mouth at bedtime.   XARELTO  20 MG TABS tablet TAKE 1 TABLET BY MOUTH DAILY WITH SUPPER   No facility-administered encounter medications on file as of 09/08/2023.    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   Cataracts, both eyes    worst on left   Colon polyps    Complication of anesthesia    pt had spinal for a knee replacement--woke up during surgery--and sick after surgery   Depression    Diverticulosis    DVT (deep venous thrombosis) (HCC) 2001 or 2002   right leg-required hospitalization x 8 days   DVT (deep venous thrombosis) (HCC) 1996   right   GERD (gastroesophageal reflux disease) 11/18/2022   Glaucoma    both eyes   H/O hiatal hernia    Hearing impaired person, bilateral    bilateral   Hemorrhoid    History of IBS    History of kidney stones    x 1-passed   Hyperlipidemia 2012   Hypertension    Lumbago    Lumbar post-laminectomy syndrome    Myocardial infarction [facility]) December 2019   PAF (paroxysmal atrial fibrillation) (HCC)    PONV (postoperative nausea and vomiting)    Sleep apnea    pt uses cpap - setting of 16   Ulcer 1966   patient denies   Ulcerative colitis (HCC)    patient denies   Wears glasses    Wears hearing aid    Past Surgical History:  Procedure Laterality Date   APPENDECTOMY  1993   BACK SURGERY  2008   bone spur removed    BALLOON DILATION N/A 06/18/2022   Procedure: BALLOON DILATION;  Surgeon: Cindie Carlin POUR, DO;  [facility];  Service: Endoscopy;  Laterality: N/A;   bilateral carpal tunnel release 2009     BIOPSY  06/18/2022   Procedure: BIOPSY;  Surgeon: Cindie Carlin POUR, DO;  [facility];  Service: Endoscopy;;   cervical fusion 2011--pt has good range of motion neck      CHOLECYSTECTOMY  1994   COLONOSCOPY WITH PROPOFOL  N/A 06/18/2022   Procedure: COLONOSCOPY WITH PROPOFOL ;  Surgeon: Cindie Carlin POUR, DO;  [facility];  Service: Endoscopy;  Laterality: N/A;  8:15 AM   ESOPHAGOGASTRODUODENOSCOPY (EGD) WITH PROPOFOL  N/A 06/18/2022   Procedure: ESOPHAGOGASTRODUODENOSCOPY (EGD) WITH PROPOFOL ;  Surgeon: Cindie Carlin POUR, DO;  [facility];  Service: Endoscopy;  Laterality: N/A;   EYE SURGERY N/A    [employer] 07/13/2020   HERNIA REPAIR  2008   INSERTION OF MESH Bilateral 07/31/2016   Procedure: INSERTION OF MESH;  Surgeon: Donnice Lunger, MD;  Location: [facility];  Service: General;  Laterality: Bilateral;   JOINT REPLACEMENT Bilateral    2001 left total knee and 1985 right total knee   LAPAROSCOPY N/A 11/19/2022   Procedure: LAPAROSCOPY DIAGNOSTIC;  Surgeon: Vernetta Berg, MD;  Location: [facility] OR;  Service: General;  Laterality: N/A;   LUMBAR LAMINECTOMY/DECOMPRESSION MICRODISCECTOMY N/A 07/24/2021   Procedure: DECOMPRESSION AND REMOVAL OF CYST LUMBAR FIVE THROUGH SACRAL ONE;  Surgeon: Burnetta Aures, MD;  Location: [facility] OR;  Service: Orthopedics;  Laterality: N/A;   LYSIS OF ADHESION  11/19/2022   Procedure: LAPAROSCOPIC LYSIS OF ADHESION;  Surgeon: Vernetta Berg, MD;  Location: [facility];  Service: General;;   MASS EXCISION Left 01/04/2014   Procedure: LEFT MIDDLE FINGER MASS EXCISION WITH NAILBED REPAIR AND ADVANCEMENT FLAP;  Surgeon: Elsie Mussel, MD;  Location: [facility];  Service: Orthopedics;  Laterality: Left;   mass removed from 2nd finger Left 03/2013   squamous carcinoma   nissen fundoplication 1995     POLYPECTOMY  06/18/2022   Procedure: POLYPECTOMY;  Surgeon: Cindie Carlin POUR, DO;  [facility];  Service: Endoscopy;;   PROSTATE SURGERY  2014   TURP   right wrist fusion 12/12 - pt wearing brace on the wrist-not started physical therapy yet Right    fusion prevent limited ROM to right wrist    SPINE SURGERY  2009   cervical spine fusion   thyroid  nodule     removed   TOTAL KNEE REVISION Right 11/17/2013   Procedure: RIGHT KNEE POLYETHYLENE REVISION, bone graft;  Surgeon: Dempsey LULLA Moan, MD;  [facility];  Service: Orthopedics;  Laterality: Right;   TOTAL SHOULDER ARTHROPLASTY Right 01/28/2018   Procedure: RIGHT TOTAL SHOULDER ARTHROPLASTY;  Surgeon: Kay Kemps, MD;  Location[facility] OR;  Service: Orthopedics;  Laterality: Right;   TOTAL SHOULDER ARTHROPLASTY Left 07/15/2018   Procedure: LEFT SHOULDER ANATOMIC TOTAL SHOULDER ARTHROPLASTY;  Surgeon: Kay Kemps, MD;  Location[facility];  Service: Orthopedics;  Laterality: Left;   TRANSURETHRAL RESECTION OF PROSTATE  10/09/2011   Procedure: TRANSURETHRAL RESECTION OF THE PROSTATE WITH GYRUS INSTRUMENTS;  Surgeon: Donnice Gwenyth Brooks, MD;  Location[facility];  Service: Urology;  Laterality: N/A;   uvvvp     XI ROBOTIC ASSISTED INGUINAL HERNIA REPAIR WITH MESH Bilateral 07/31/2016   Procedure: XI ROBOTIC BILATERAL INGUINAL HERNIA REPAIR WITH MESH;  Surgeon: Donnice Lunger, MD;  Location: [facility];  Service: General;  Laterality: Bilateral;   Family History  Problem Relation Age of Onset   Arthritis Mother    Hearing loss Mother    Hyperlipidemia Mother    Hypertension Mother    Deep vein thrombosis Mother    Bone cancer Father        deceased   Cancer Father    Diabetes Father    Prostate cancer Father    Ulcerative colitis Father    Diabetes Sister    Hearing loss Maternal Grandmother    Hearing loss Maternal Grandfather    Diabetes Paternal Grandfather    Social History   Socioeconomic History   Marital status: Married    Spouse name: Slater   Number of children: 1   Years of education: Not on file   Highest education level: Some college, no degree  Occupational History   Occupation: disabled  Tobacco Use   Smoking status: Former    Current packs/day: 0.00    Average packs/day: 1 pack/day for 15.0 years (15.0  ttl pk-yrs)    Types: Cigarettes    Start  date: 08/03/1968    Quit date: 08/04/1983    Years since quitting: 40.1   Smokeless tobacco: Never   Tobacco comments:    quit smoking 1985  Vaping Use   Vaping status: Never Used  Substance and Sexual Activity   Alcohol  use: Yes    Alcohol /week: 3.0 standard drinks of alcohol     Types: 3 Cans of beer per week    Comment: 4 beers a week   Drug use: No   Sexual activity: Not Currently  Other Topics Concern   Not on file  Social History Narrative   Married x 3 years in 2021.   1 daughter, 1 grand son and 1 grand daughter   Social Drivers of Corporate Investment Banker Strain: Low Risk  (09/08/2023)   Overall Financial Resource Strain (CARDIA)    Difficulty of Paying Living Expenses: Not hard at all  Food Insecurity: No Food Insecurity (09/08/2023)   Hunger Vital Sign    Worried About Running Out of Food in the Last Year: Never true    Ran Out of Food in the Last Year: Never true  Transportation Needs: No Transportation Needs (09/08/2023)   PRAPARE - Administrator, Civil Service (Medical): No    Lack of Transportation (Non-Medical): No  Physical Activity: Sufficiently Active (09/08/2023)   Exercise Vital Sign    Days of Exercise per Week: 7 days    Minutes of Exercise per Session: 60 min  Stress: No Stress Concern Present (09/08/2023)   [employer] of Occupational Health - Occupational Stress Questionnaire    Feeling of Stress : Not at all  Social Connections: Moderately Isolated (09/08/2023)   Social Connection and Isolation Panel [NHANES]    Frequency of Communication with Friends and Family: More than three times a week    Frequency of Social Gatherings with Friends and Family: More than three times a week    Attends Religious Services: Never    Database Administrator or Organizations: No    Attends Engineer, Structural: Never    Marital Status: Married    Tobacco Counseling Counseling given: Not  Answered Tobacco comments: quit smoking 1985   Clinical Intake:  Pre-visit preparation completed: Yes  Pain : No/denies pain     BMI - recorded: 35.4 Diabetes: No  How often do you need to have someone help you when you read instructions, pamphlets, or other written materials from your doctor or pharmacy?: 1 - Never  Interpreter Needed?: No  Information entered by :: Verdie Saba, CMA   Activities of Daily Living    09/08/2023    2:44 PM 11/17/2022   11:17 PM  In your present state of health, do you have any difficulty performing the following activities:  Hearing? 0 0  Vision? 0 0  Difficulty concentrating or making decisions? 0 0  Walking or climbing stairs? 0 0  Dressing or bathing? 0 0  Doing errands, shopping? 0 0  Preparing Food and eating ? N   Using the Toilet? N   In the past six months, have you accidently leaked urine? N   Do you have problems with loss of bowel control? N   Managing your Medications? N   Managing your Finances? N   Housekeeping or managing your Housekeeping? N     Patient Care Team: Melvenia Manus BRAVO, MD as PCP - General (Internal Medicine) Lonni Slain, MD as PCP - Cardiology (Cardiology) Nicholaus Sherlean CROME, [facility] (Inactive) as Pharmacist (Pharmacist)  Indicate any recent Medical Services you may have received from other than [facility] providers in the past year (date may be approximate).     Assessment:   This is a routine wellness examination for [name].  Hearing/Vision screen Hearing Screening - Comments:: Wears bilateral hearing aids Vision Screening - Comments:: Wears rx glasses - up to date with routine eye exams with  Dr Heather Oman - appt 10/06/23   Goals Addressed               This Visit's Progress     Patient Stated (pt-stated)        Patient stated he plans to lose weight (215lbs).       Depression Screen    09/08/2023    2:47 PM 08/10/2023   10:21 AM 01/22/2023   10:07 AM 09/25/2022    9:46 AM 08/04/2022     9:41 AM 08/04/2022    8:53 AM 07/20/2022    3:02 PM  PHQ 2/9 Scores  PHQ - 2 Score 0 0 0 0 0 0 0  PHQ- 9 Score   0 3       Fall Risk    09/08/2023    2:49 PM 08/10/2023   10:21 AM 01/22/2023   10:07 AM 09/25/2022    9:46 AM 08/04/2022    8:52 AM  Fall Risk   Falls in the past year? 1 1 1  0 0  Number falls in past yr: 0 0 0 0 0  Injury with Fall? 1 1 0 0 0  Comment fractured ribs      Risk for fall due to : History of fall(s);Impaired balance/gait Other (Comment) No Fall Risks No Fall Risks No Fall Risks  Risk for fall due to: Comment  ice     Follow up Falls evaluation completed;Falls prevention discussed Falls evaluation completed;Education provided;Falls prevention discussed Falls evaluation completed Falls evaluation completed Falls evaluation completed    MEDICARE RISK AT HOME: Medicare Risk at Home Any stairs in or around the home?: Yes If so, are there any without handrails?: No Home free of loose throw rugs in walkways, pet beds, electrical cords, etc?: Yes Adequate lighting in your home to reduce risk of falls?: Yes Life alert?: No Use of a cane, walker or w/c?: No Grab bars in the bathroom?: No Shower chair or bench in shower?: No Elevated toilet seat or a handicapped toilet?: Yes  TIMED UP AND GO:  Was the test performed?  No    Cognitive Function:        09/08/2023    2:50 PM 06/19/2021    9:57 AM 07/16/2020   10:29 AM  6CIT Screen  What Year? 0 points 0 points 0 points  What month? 0 points 0 points 0 points  What time? 0 points 0 points 0 points  Count back from 20 0 points 0 points 0 points  Months in reverse 0 points 2 points 0 points  Repeat phrase 0 points 2 points 0 points  Total Score 0 points 4 points 0 points    Immunizations Immunization History  Administered Date(s) Administered   Fluad Quad(high Dose 65+) 04/06/2019, 05/01/2020, 04/24/2021, 04/26/2022, 04/07/2023   Influenza Split 07/04/2012, 03/03/2013   Influenza,inj,Quad PF,6+ Mos  05/03/2014, 04/30/2015, 04/03/2016, 04/08/2017, 04/11/2018   Influenza,inj,quad, With Preservative 05/12/2017   Moderna Covid-19 Fall Seasonal Vaccine 21yrs & older 04/07/2023   Moderna Sars-Covid-2 Vaccination 09/14/2019, 06/06/2020   PFIZER(Purple Top)SARS-COV-2 Vaccination 10/13/2019   Pfizer Covid-19 Vaccine Bivalent Booster 37yrs &  up 08/11/2021   Pneumococcal Conjugate-13 05/03/2014   Pneumococcal Polysaccharide-23 08/03/2008, 04/03/2016   Tdap 08/03/2010, 08/11/2021   Zoster Recombinant(Shingrix) 08/11/2021   Zoster, Live 08/03/2010    TDAP status: Up to date - 08/11/21  Flu Vaccine status: Up to date - 04/07/23  Pneumococcal vaccine status: Up to date - 04/03/2016  Covid-19 vaccine status: Completed vaccines  Qualifies for Shingles Vaccine? Yes   Zostavax completed No   Shingrix Completed?: No.    Education has been provided regarding the importance of this vaccine. Patient has been advised to call insurance company to determine out of pocket expense if they have not yet received this vaccine. Advised may also receive vaccine at local pharmacy or Health Dept. Verbalized acceptance and understanding.  Screening Tests Health Maintenance  Topic Date Due   Zoster Vaccines- Shingrix (2 of 2) 10/06/2021   Medicare Annual Wellness (AWV)  09/07/2024   DTaP/Tdap/Td (3 - Td or Tdap) 08/12/2031   Colonoscopy  06/18/2032   Pneumonia Vaccine 73+ Years old  Completed   INFLUENZA VACCINE  Completed   COVID-19 Vaccine  Completed   Hepatitis C Screening  Completed   HPV VACCINES  Aged Out    Health Maintenance  Health Maintenance Due  Topic Date Due   Zoster Vaccines- Shingrix (2 of 2) 10/06/2021    Colorectal cancer screening: Type of screening: Colonoscopy. Completed 06/18/2022. Repeat every 10 years   Additional Screening:  Hepatitis C Screening: does qualify; Completed 04/07/2018  Vision Screening: Recommended annual ophthalmology exams for early detection of glaucoma and  other disorders of the eye. Is the patient up to date with their annual eye exam?  Yes  Who is the provider or what is the name of the office in which the patient attends annual eye exams? Dr Heather Oman If pt is not established with a provider, would they like to be referred to a provider to establish care? No .   Dental Screening: Recommended annual dental exams for proper oral hygiene   Community Resource Referral / Chronic Care Management: CRR required this visit?  No   CCM required this visit?  No     Plan:     I have personally reviewed and noted the following in the patient's chart:   Medical and social history Use of alcohol , tobacco or illicit drugs  Current medications and supplements including opioid prescriptions. Patient is not currently taking opioid prescriptions. Functional ability and status Nutritional status Physical activity Advanced directives List of other physicians Hospitalizations, surgeries, and ER visits in previous 12 months Vitals Screenings to include cognitive, depression, and falls Referrals and appointments  In addition, I have reviewed and discussed with patient certain preventive protocols, quality metrics, and best practice recommendations. A written personalized care plan for preventive services as well as general preventive health recommendations were provided to patient.     Verdie CHRISTELLA Saba, CMA   09/08/2023   After Visit Summary: (MyChart) Due to this being a telephonic visit, the after visit summary with patients personalized plan was offered to patient via MyChart   Nurse Notes: Will mail the patient Advance Directive forms to complete.

## 2023-09-10 ENCOUNTER — Other Ambulatory Visit: Payer: Self-pay

## 2023-09-10 DIAGNOSIS — I480 Paroxysmal atrial fibrillation: Secondary | ICD-10-CM

## 2023-09-10 MED ORDER — RIVAROXABAN 20 MG PO TABS: 20.0000 mg | ORAL_TABLET | Freq: Every day | ORAL | 1 refills | Status: DC

## 2023-09-10 NOTE — Telephone Encounter (Signed)
 Prescription refill request for Xarelto received.  Indication: Afib  Last office visit: 08/30/23 [name])  Weight: 102.5kg Age: 73 Scr: 1.13 (08/10/23)  CrCl: 83.35ml/min  Appropriate dose. Refill sent.

## 2023-09-13 ENCOUNTER — Other Ambulatory Visit: Payer: Self-pay

## 2023-09-13 MED ORDER — ATORVASTATIN CALCIUM 40 MG PO TABS
40.0000 mg | ORAL_TABLET | Freq: Every day | ORAL | 3 refills | Status: DC
Start: 2023-09-13 — End: 2024-06-09

## 2023-10-06 DIAGNOSIS — H401131 Primary open-angle glaucoma, bilateral, mild stage: Secondary | ICD-10-CM | POA: Diagnosis not present

## 2023-11-08 DIAGNOSIS — M79644 Pain in right finger(s): Secondary | ICD-10-CM | POA: Diagnosis not present

## 2023-11-18 ENCOUNTER — Other Ambulatory Visit: Payer: Self-pay | Admitting: Cardiology

## 2023-11-18 DIAGNOSIS — I480 Paroxysmal atrial fibrillation: Secondary | ICD-10-CM

## 2023-11-18 NOTE — Telephone Encounter (Signed)
 Prescription refill request for Xarelto received.  Indication: Afib  Last office visit: 08/30/23 [name])  Weight: 102.5kg Age: 73 Scr: 1.13 (08/10/23)  CrCl: 83.35ml/min  Appropriate dose. Refill sent.

## 2023-12-03 DIAGNOSIS — R2231 Localized swelling, mass and lump, right upper limb: Secondary | ICD-10-CM | POA: Diagnosis not present

## 2023-12-26 DIAGNOSIS — R2231 Localized swelling, mass and lump, right upper limb: Secondary | ICD-10-CM | POA: Diagnosis not present

## 2024-01-11 DIAGNOSIS — H401131 Primary open-angle glaucoma, bilateral, mild stage: Secondary | ICD-10-CM | POA: Diagnosis not present

## 2024-01-18 DIAGNOSIS — I2510 Atherosclerotic heart disease of native coronary artery without angina pectoris: Secondary | ICD-10-CM | POA: Diagnosis not present

## 2024-01-18 DIAGNOSIS — E7800 Pure hypercholesterolemia, unspecified: Secondary | ICD-10-CM | POA: Diagnosis not present

## 2024-01-19 LAB — LIPID PANEL
Chol/HDL Ratio: 2.6 ratio (ref 0.0–5.0)
Cholesterol, Total: 130 mg/dL (ref 100–199)
HDL: 50 mg/dL (ref >39)
LDL Chol Calc (NIH): 67 mg/dL (ref 0–99)
Triglycerides: 58 mg/dL (ref 0–149)
VLDL Cholesterol Cal: 13 mg/dL (ref 5–40)

## 2024-01-27 ENCOUNTER — Other Ambulatory Visit: Payer: Self-pay | Admitting: Cardiology

## 2024-01-27 DIAGNOSIS — I480 Paroxysmal atrial fibrillation: Secondary | ICD-10-CM

## 2024-01-27 NOTE — Telephone Encounter (Signed)
 Prescription refill request for Xarelto received.  Indication: Afib  Last office visit: 08/30/23 [name])  Weight: 102.5kg Age: 73 Scr: 1.13 (08/10/23)  CrCl: 83.35ml/min  Appropriate dose. Refill sent.

## 2024-02-07 ENCOUNTER — Ambulatory Visit: Admitting: Internal Medicine

## 2024-02-07 ENCOUNTER — Ambulatory Visit: Payer: Medicare Other | Admitting: Internal Medicine

## 2024-02-16 ENCOUNTER — Encounter: Payer: Self-pay | Admitting: Internal Medicine

## 2024-03-30 ENCOUNTER — Encounter: Payer: Self-pay | Admitting: Internal Medicine

## 2024-03-30 ENCOUNTER — Ambulatory Visit (INDEPENDENT_AMBULATORY_CARE_PROVIDER_SITE_OTHER): Admitting: Internal Medicine

## 2024-03-30 VITALS — BP 128/72 | HR 60 | Ht 67.0 in | Wt 223.0 lb

## 2024-03-30 DIAGNOSIS — R140 Abdominal distension (gaseous): Secondary | ICD-10-CM | POA: Diagnosis not present

## 2024-03-30 DIAGNOSIS — E559 Vitamin D deficiency, unspecified: Secondary | ICD-10-CM | POA: Diagnosis not present

## 2024-03-30 DIAGNOSIS — R7303 Prediabetes: Secondary | ICD-10-CM

## 2024-03-30 DIAGNOSIS — E782 Mixed hyperlipidemia: Secondary | ICD-10-CM | POA: Diagnosis not present

## 2024-03-30 DIAGNOSIS — R109 Unspecified abdominal pain: Secondary | ICD-10-CM | POA: Diagnosis not present

## 2024-03-30 DIAGNOSIS — Z125 Encounter for screening for malignant neoplasm of prostate: Secondary | ICD-10-CM

## 2024-03-30 DIAGNOSIS — F411 Generalized anxiety disorder: Secondary | ICD-10-CM | POA: Insufficient documentation

## 2024-03-30 DIAGNOSIS — K529 Noninfective gastroenteritis and colitis, unspecified: Secondary | ICD-10-CM | POA: Diagnosis not present

## 2024-03-30 DIAGNOSIS — K219 Gastro-esophageal reflux disease without esophagitis: Secondary | ICD-10-CM

## 2024-03-30 DIAGNOSIS — F339 Major depressive disorder, recurrent, unspecified: Secondary | ICD-10-CM

## 2024-03-30 DIAGNOSIS — I10 Essential (primary) hypertension: Secondary | ICD-10-CM

## 2024-03-30 DIAGNOSIS — I480 Paroxysmal atrial fibrillation: Secondary | ICD-10-CM | POA: Diagnosis not present

## 2024-03-30 MED ORDER — DICYCLOMINE HCL 10 MG PO CAPS
10.0000 mg | ORAL_CAPSULE | Freq: Three times a day (TID) | ORAL | 1 refills | Status: AC
Start: 2024-03-30 — End: ?

## 2024-03-30 MED ORDER — ESOMEPRAZOLE MAGNESIUM 40 MG PO CPDR: 40.0000 mg | DELAYED_RELEASE_CAPSULE | Freq: Every day | ORAL | 11 refills | Status: AC

## 2024-03-30 NOTE — Progress Notes (Signed)
 Established Patient Office Visit  Subjective:  Patient ID: [name], male    DOB: 23-Nov-1950  Age: 73 y.o. MRN: [id number]  CC:  Chief Complaint  Patient presents with   Abdominal Pain    Abdominal cramping with diarrhea, gas, acid in throat   Excessive Sweating    Excessive sweating    hand issue    Hand problems, will randomly drop things     HPI [name] is a 73 y.o. male with past medical history of atrial fibrillation, HTN, OSA, GERD, lumbar postlaminectomy syndrome, HLD and MDD who presents for f/u of his chronic medical conditions.  Atrial fibrillation and HTN: He currently takes metoprolol  25 mg as needed and Xarelto  20 mg QD, followed by cardiology. BP is well-controlled. Takes medications regularly. Patient denies headache, dizziness, chest pain, dyspnea or palpitations.  Chronic abdominal pain: He reports epigastric pain, bloating and loose bowel movements at times.  He has been evaluated by local GI provider, was told of IBS-D in the past.  He has history of SBO as well.  He has history of Nissen's fundoplication for GERD.  He takes Nexium  20 mg as needed.  Denies melena or hematochezia.  MDD and GAD: He takes Trintellix  for MDD, through patient assistance program.  He takes trazodone  100 mg nightly for insomnia.  Denies any recent worsening of anxiety, SI or HI currently.  He reports excessive sweating, which could be related to Trintellix  and trazodone .  OA of hands: He has chronic hand stiffness and pain.  He has been evaluated by Dr. Camella for hand OA.    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    Cataracts, both eyes    worst on left   Colon polyps    Complication of anesthesia    pt had spinal for a knee replacement--woke up during surgery--and sick after surgery   Depression    Diverticulosis    DVT (deep venous thrombosis) (HCC) 2001 or 2002   right leg-required hospitalization x 8 days   DVT (deep venous thrombosis) (HCC) 1996   right   GERD (gastroesophageal reflux disease) 11/18/2022   Glaucoma    both eyes   H/O hiatal hernia    Hearing impaired person, bilateral    bilateral   Hemorrhoid    History of IBS    History of kidney stones    x 1-passed   Hyperlipidemia 2012   Hypertension    Lumbago    Lumbar post-laminectomy syndrome    Myocardial infarction [facility]) December 2019   PAF (paroxysmal atrial fibrillation) (HCC)    PONV (postoperative nausea and vomiting)    Sleep apnea    pt uses cpap - setting of 16   Ulcer 1966   patient denies   Ulcerative colitis (HCC)    patient denies   Wears glasses    Wears hearing aid     Past Surgical History:  Procedure Laterality Date   APPENDECTOMY  1993   BACK SURGERY  2008   bone spur removed    BALLOON  DILATION N/A 06/18/2022   Procedure: BALLOON DILATION;  Surgeon: Cindie Carlin POUR, DO;  [facility];  Service: Endoscopy;  Laterality: N/A;   bilateral carpal tunnel release 2009     BIOPSY  06/18/2022   Procedure: BIOPSY;  Surgeon: Cindie Carlin POUR, DO;  [facility];  Service: Endoscopy;;   cervical fusion 2011--pt has good range of motion neck     CHOLECYSTECTOMY  1994   COLONOSCOPY WITH PROPOFOL  N/A 06/18/2022   Procedure: COLONOSCOPY WITH PROPOFOL ;  Surgeon: Cindie Carlin POUR, DO;  [facility];  Service: Endoscopy;  Laterality: N/A;  8:15 AM   ESOPHAGOGASTRODUODENOSCOPY (EGD) WITH PROPOFOL  N/A 06/18/2022   Procedure: ESOPHAGOGASTRODUODENOSCOPY (EGD) WITH PROPOFOL ;  Surgeon: Cindie Carlin POUR, DO;  [facility];  Service: Endoscopy;  Laterality: N/A;   EYE  SURGERY N/A    [employer] 07/13/2020   HERNIA REPAIR  2008   INSERTION OF MESH Bilateral 07/31/2016   Procedure: INSERTION OF MESH;  Surgeon: Donnice Lunger, MD;  [facility];  Service: General;  Laterality: Bilateral;   JOINT REPLACEMENT Bilateral    2001 left total knee and 1985 right total knee   LAPAROSCOPY N/A 11/19/2022   Procedure: LAPAROSCOPY DIAGNOSTIC;  Surgeon: Vernetta Berg, MD;  [facility];  Service: General;  Laterality: N/A;   LUMBAR LAMINECTOMY/DECOMPRESSION MICRODISCECTOMY N/A 07/24/2021   Procedure: DECOMPRESSION AND REMOVAL OF CYST LUMBAR FIVE THROUGH SACRAL ONE;  Surgeon: Burnetta Aures, MD;  Location: [facility];  Service: Orthopedics;  Laterality: N/A;   LYSIS OF ADHESION  11/19/2022   Procedure: LAPAROSCOPIC LYSIS OF ADHESION;  Surgeon: Vernetta Berg, MD;  Location: [facility];  Service: General;;   MASS EXCISION Left 01/04/2014   Procedure: LEFT MIDDLE FINGER MASS EXCISION WITH NAILBED REPAIR AND ADVANCEMENT FLAP;  Surgeon: Elsie Mussel, MD;  Location: [facility];  Service: Orthopedics;  Laterality: Left;   mass removed from 2nd finger Left 03/2013   squamous carcinoma   nissen fundoplication 1995     POLYPECTOMY  06/18/2022   Procedure: POLYPECTOMY;  Surgeon: Cindie Carlin POUR, DO;  [facility];  Service: Endoscopy;;   PROSTATE SURGERY  2014   TURP   right wrist fusion 12/12 - pt wearing brace on the wrist-not started physical therapy yet Right    fusion prevent limited ROM to right wrist   SPINE SURGERY  2009   cervical spine fusion   thyroid  nodule     removed   TOTAL KNEE REVISION Right 11/17/2013   Procedure: RIGHT KNEE POLYETHYLENE REVISION, bone graft;  Surgeon: Dempsey LULLA Moan, MD;  [facility];  Service: Orthopedics;  Laterality: Right;   TOTAL SHOULDER ARTHROPLASTY Right 01/28/2018   Procedure: RIGHT TOTAL SHOULDER ARTHROPLASTY;  Surgeon: Kay Kemps, MD;  Location[facility] OR;  Service: Orthopedics;  Laterality:  Right;   TOTAL SHOULDER ARTHROPLASTY Left 07/15/2018   Procedure: LEFT SHOULDER ANATOMIC TOTAL SHOULDER ARTHROPLASTY;  Surgeon: Kay Kemps, MD;  Location: [facility] OR;  Service: Orthopedics;  Laterality: Left;   TRANSURETHRAL RESECTION OF PROSTATE  10/09/2011   Procedure: TRANSURETHRAL RESECTION OF THE PROSTATE WITH GYRUS INSTRUMENTS;  Surgeon: Donnice Gwenyth Brooks, MD;  [facility];  Service: Urology;  Laterality: N/A;   uvvvp     XI ROBOTIC ASSISTED INGUINAL HERNIA REPAIR WITH MESH Bilateral 07/31/2016   Procedure: XI ROBOTIC BILATERAL INGUINAL HERNIA REPAIR WITH MESH;  Surgeon: Donnice Lunger, MD;  [facility];  Service: General;  Laterality: Bilateral;    Family  History  Problem Relation Age of Onset   Arthritis Mother    Hearing loss Mother    Hyperlipidemia Mother    Hypertension Mother    Deep vein thrombosis Mother    Bone cancer Father        deceased   Cancer Father    Diabetes Father    Prostate cancer Father    Ulcerative colitis Father    Diabetes Sister    Hearing loss Maternal Grandmother    Hearing loss Maternal Grandfather    Diabetes Paternal Grandfather     Social History   Socioeconomic History   Marital status: Married    Spouse name: Slater   Number of children: 1   Years of education: Not on file   Highest education level: Some college, no degree  Occupational History   Occupation: disabled  Tobacco Use   Smoking status: Former    Current packs/day: 0.00    Average packs/day: 1 pack/day for 15.0 years (15.0 ttl pk-yrs)    Types: Cigarettes    Start date: 08/03/1968    Quit date: 08/04/1983    Years since quitting: 40.6   Smokeless tobacco: Never   Tobacco comments:    quit smoking 1985  Vaping Use   Vaping status: Never Used  Substance and Sexual Activity   Alcohol  use: Yes    Alcohol /week: 3.0 standard drinks of alcohol     Types: 3 Cans of beer per week    Comment: 4 beers a week   Drug use: No   Sexual activity: Not Currently   Other Topics Concern   Not on file  Social History Narrative   Married x 3 years in 2021.   1 daughter, 1 grand son and 1 grand daughter   Social Drivers of Corporate investment banker Strain: Low Risk  (02/04/2024)   Overall Financial Resource Strain (CARDIA)    Difficulty of Paying Living Expenses: Not hard at all  Food Insecurity: No Food Insecurity (02/04/2024)   Hunger Vital Sign    Worried About Running Out of Food in the Last Year: Never true    Ran Out of Food in the Last Year: Never true  Transportation Needs: No Transportation Needs (02/04/2024)   PRAPARE - Administrator, Civil Service (Medical): No    Lack of Transportation (Non-Medical): No  Physical Activity: Sufficiently Active (02/04/2024)   Exercise Vital Sign    Days of Exercise per Week: 4 days    Minutes of Exercise per Session: 150+ min  Stress: No Stress Concern Present (02/04/2024)   [employer] of Occupational Health - Occupational Stress Questionnaire    Feeling of Stress: Not at all  Social Connections: Moderately Isolated (02/04/2024)   Social Connection and Isolation Panel    Frequency of Communication with Friends and Family: Twice a week    Frequency of Social Gatherings with Friends and Family: Twice a week    Attends Religious Services: Never    Database administrator or Organizations: No    Attends Engineer, structural: Not on file    Marital Status: Married  Catering manager Violence: Not At Risk (09/08/2023)   Humiliation, Afraid, Rape, and Kick questionnaire    Fear of Current or Ex-Partner: No    Emotionally Abused: No    Physically Abused: No    Sexually Abused: No    Outpatient Medications Prior to Visit  Medication Sig Dispense Refill   acetaminophen  (TYLENOL ) 500 MG tablet Take 500  mg by mouth every 6 (six) hours as needed for mild pain or headache.     albuterol  (VENTOLIN  HFA) 108 (90 Base) MCG/ACT inhaler Inhale 2 puffs into the lungs every 4 (four) hours as needed  for wheezing or shortness of breath. 18 g 2   atorvastatin  (LIPITOR) 40 MG tablet Take 1 tablet (40 mg total) by mouth daily. TAKE 1 TABLET(40 MG) BY MOUTH DAILY 90 tablet 3   Calcium  Carb-Cholecalciferol (CALCIUM  + D3 PO) Take 1 tablet by mouth at bedtime.     diphenhydrAMINE  (BENADRYL ) 25 mg capsule Take 25 mg by mouth daily as needed for allergies.     fluticasone  (FLONASE ) 50 MCG/ACT nasal spray Place 1 spray into both nostrils at bedtime as needed for allergies.     hydroxypropyl methylcellulose / hypromellose (ISOPTO TEARS / GONIOVISC) 2.5 % ophthalmic solution Place 1 drop into both eyes 3 (three) times daily as needed for dry eyes.     latanoprost  (XALATAN ) 0.005 % ophthalmic solution Place 1 drop into both eyes at bedtime.     meclizine  (ANTIVERT ) 25 MG tablet TAKE 1 TABLET(25 MG) BY MOUTH THREE TIMES DAILY AS NEEDED FOR DIZZINESS 30 tablet 0   metoprolol  tartrate (LOPRESSOR ) 25 MG tablet Take 25 mg by mouth See admin instructions. Take 25 mg by mouth one to three times a day when in A-Fib (call 9-1-1 if no relief after three doses)     Multiple Vitamin (MULTIVITAMIN WITH MINERALS) TABS tablet Take 1 tablet by mouth at bedtime.     OVER THE COUNTER MEDICATION Take 1-2 tablets by mouth See admin instructions. Tylenol  for Tension headaches- Take 1-2 tablets by mouth every 6 hours as needed for headaches     PRESCRIPTION MEDICATION Inhale into the lungs See admin instructions. CPAP- At bedtime     rivaroxaban  (XARELTO ) 20 MG TABS tablet TAKE 1 TABLET BY MOUTH DAILY  WITH SUPPER 100 tablet 1   sodium chloride  (OCEAN) 0.65 % SOLN nasal spray Place 1 spray into both nostrils as needed for congestion. 88 mL 0   traZODone  (DESYREL ) 50 MG tablet Take 100 mg by mouth at bedtime.     vortioxetine  HBr (TRINTELLIX ) 10 MG TABS tablet Take 1 tablet (10 mg total) by mouth at bedtime. 30 tablet 5   esomeprazole  (NEXIUM ) 20 MG capsule Take 1 capsule (20 mg total) by mouth daily at 12 noon. 30 capsule 11    No facility-administered medications prior to visit.    Allergies  Allergen Reactions   Latex Rash and Other (See Comments)    CONDOM CATHETER- SEVERE REACTION!!!!!   Adhesive [Tape] Other (See Comments)    Causes blisters - pls use paper tape    ROS Review of Systems  Constitutional:  Negative for chills and fever.  HENT:  Negative for congestion and sore throat.   Eyes:  Negative for pain and discharge.  Respiratory:  Negative for cough and shortness of breath.   Cardiovascular:  Negative for chest pain and palpitations.  Gastrointestinal:  Positive for abdominal pain and diarrhea. Negative for nausea and vomiting.       Bloating  Endocrine: Negative for polydipsia and polyuria.  Genitourinary:  Negative for dysuria and hematuria.  Musculoskeletal:  Positive for arthralgias, back pain and neck pain. Negative for neck stiffness.  Skin:  Negative for rash.  Neurological:  Negative for dizziness and weakness.  Psychiatric/Behavioral:  Negative for agitation and behavioral problems.       Objective:    Physical Exam  Vitals reviewed.  Constitutional:      General: He is not in acute distress.    Appearance: He is not diaphoretic.  HENT:     Head: Normocephalic and atraumatic.     Nose: Nose normal.     Mouth/Throat:     Mouth: Mucous membranes are moist.  Eyes:     General: No scleral icterus.    Extraocular Movements: Extraocular movements intact.  Cardiovascular:     Rate and Rhythm: Normal rate and regular rhythm.     Heart sounds: No murmur heard. Pulmonary:     Breath sounds: Normal breath sounds. No wheezing or rales.  Abdominal:     Palpations: Abdomen is soft.     Tenderness: There is abdominal tenderness (Mild, epigastric).  Musculoskeletal:     Cervical back: Neck supple. No tenderness.     Right lower leg: No edema.     Left lower leg: No edema.  Skin:    General: Skin is warm.     Findings: No rash.  Neurological:     General: No focal deficit  present.     Mental Status: He is alert and oriented to person, place, and time.  Psychiatric:        Mood and Affect: Mood normal.        Behavior: Behavior normal.     BP 128/72   Pulse 60   Ht 5' 7 (1.702 m)   Wt 223 lb (101.2 kg)   SpO2 97%   BMI 34.93 kg/m  Wt Readings from Last 3 Encounters:  03/30/24 223 lb (101.2 kg)  09/08/23 226 lb (102.5 kg)  08/30/23 230 lb 6.4 oz (104.5 kg)    Lab Results  Component Value Date   TSH 3.430 08/10/2023   Lab Results  Component Value Date   WBC 8.0 08/10/2023   HGB 15.9 08/10/2023   HCT 46.6 08/10/2023   MCV 96 08/10/2023   PLT 197 08/10/2023   Lab Results  Component Value Date   NA 141 08/10/2023   K 4.1 08/10/2023   CO2 25 08/10/2023   GLUCOSE 106 (H) 08/10/2023   BUN 20 08/10/2023   CREATININE 1.13 08/10/2023   BILITOT 0.5 08/10/2023   ALKPHOS 89 08/10/2023   AST 28 08/10/2023   ALT 30 08/10/2023   PROT 7.2 08/10/2023   ALBUMIN 4.8 08/10/2023   CALCIUM  9.5 08/10/2023   ANIONGAP 8 11/20/2022   EGFR 68 08/10/2023   GFR 80.20 01/01/2016   Lab Results  Component Value Date   CHOL 130 01/18/2024   Lab Results  Component Value Date   HDL 50 01/18/2024   Lab Results  Component Value Date   LDLCALC 67 01/18/2024   Lab Results  Component Value Date   TRIG 58 01/18/2024   Lab Results  Component Value Date   CHOLHDL 2.6 01/18/2024   Lab Results  Component Value Date   HGBA1C 5.6 08/10/2023      Assessment & Plan:   Problem List Items Addressed This Visit       Cardiovascular and Mediastinum   Essential hypertension   BP Readings from Last 1 Encounters:  03/30/24 128/72   Well-controlled with low-salt diet On metoprolol  as needed for rate control for A-fib Advised DASH diet and moderate exercise/walking as tolerated       Paroxysmal atrial fibrillation (HCC)   Rate controlled with metoprolol , although takes it as needed On Xarelto  for [facility] Followed by cardiology  Relevant Orders    TSH   CMP14+EGFR   CBC with Differential/Platelet     Digestive   GERD (gastroesophageal reflux disease)   Uncontrolled Epigastric pain and belching could be related to GERD Increase dose of Nexium  to 40 mg QD Referred to GI for further evaluation - prefers [facility] in [place]       Relevant Medications   esomeprazole  (NEXIUM ) 40 MG capsule   dicyclomine  (BENTYL ) 10 MG capsule   Chronic diarrhea - Primary   Has been told of IBS-D in the past Bentyl  as needed for abdominal spasms/diarrhea Refer to GI      Relevant Medications   dicyclomine  (BENTYL ) 10 MG capsule   Other Relevant Orders   Ambulatory referral to Gastroenterology     Other   Hyperlipidemia   Relevant Orders   Lipid panel   Depression, recurrent (HCC)   Overall well-controlled with Trintellix , gets it through patient assistance program      Relevant Orders   TSH   CMP14+EGFR   CBC with Differential/Platelet   Abdominal bloating with cramps   Relevant Medications   dicyclomine  (BENTYL ) 10 MG capsule   Prediabetes   Lab Results  Component Value Date   HGBA1C 5.6 08/10/2023   Continue to follow low-carb diet      Relevant Orders   Hemoglobin A1c   CMP14+EGFR   GAD (generalized anxiety disorder)   Well-controlled with Trintellix  Takes trazodone  100 mg nightly for insomnia Excessive sweating could be related to Trintellix  and trazodone , may need to adjust dose mood change medicine if persistent symptoms      Other Visit Diagnoses       Prostate cancer screening       Relevant Orders   PSA     Vitamin D  deficiency       Relevant Orders   VITAMIN D  25 Hydroxy (Vit-D Deficiency, Fractures)       Meds ordered this encounter  Medications   esomeprazole  (NEXIUM ) 40 MG capsule    Sig: Take 1 capsule (40 mg total) by mouth daily at 12 noon.    Dispense:  30 capsule    Refill:  11   dicyclomine  (BENTYL ) 10 MG capsule    Sig: Take 1 capsule (10 mg total) by mouth 4 (four) times daily  -  before meals and at bedtime.    Dispense:  60 capsule    Refill:  1    Follow-up: Return in about 5 months (around 08/30/2024) for Annual physical.    Suzzane MARLA Blanch, MD

## 2024-03-30 NOTE — Assessment & Plan Note (Signed)
 Overall well-controlled with Trintellix , gets it through patient assistance program

## 2024-03-30 NOTE — Assessment & Plan Note (Signed)
 Has been told of IBS-D in the past Bentyl  as needed for abdominal spasms/diarrhea Refer to GI

## 2024-03-30 NOTE — Assessment & Plan Note (Signed)
 Rate controlled with metoprolol , although takes it as needed On Xarelto  for [facility] Followed by cardiology

## 2024-03-30 NOTE — Assessment & Plan Note (Signed)
 Lab Results  Component Value Date   HGBA1C 5.6 08/10/2023   Continue to follow low-carb diet

## 2024-03-30 NOTE — Assessment & Plan Note (Signed)
 Well-controlled with Trintellix  Takes trazodone  100 mg nightly for insomnia Excessive sweating could be related to Trintellix  and trazodone , may need to adjust dose mood change medicine if persistent symptoms

## 2024-03-30 NOTE — Assessment & Plan Note (Signed)
 BP Readings from Last 1 Encounters:  03/30/24 128/72   Well-controlled with low-salt diet On metoprolol  as needed for rate control for A-fib Advised DASH diet and moderate exercise/walking as tolerated

## 2024-03-30 NOTE — Assessment & Plan Note (Signed)
 Uncontrolled Epigastric pain and belching could be related to GERD Increase dose of Nexium  to 40 mg QD Referred to GI for further evaluation - prefers [facility] in [place]

## 2024-03-30 NOTE — Patient Instructions (Signed)
 Please start taking Nexium  40 mg once daily.  Please take Dicyclomine  as needed for abdominal cramps.  Please continue to take medications as prescribed.  Please continue to follow low salt diet and perform moderate exercise/walking as tolerated.

## 2024-04-12 ENCOUNTER — Other Ambulatory Visit: Payer: Self-pay

## 2024-04-12 MED ORDER — COVID-19 MRNA VACC (MODERNA) 50 MCG/0.5ML IM SUSY
0.5000 mL | PREFILLED_SYRINGE | Freq: Once | INTRAMUSCULAR | 0 refills | Status: DC
Start: 2024-04-12 — End: 2024-04-12

## 2024-04-12 MED ORDER — COVID-19 MRNA VACC (MODERNA) 50 MCG/0.5ML IM SUSY: 0.5000 mL | PREFILLED_SYRINGE | Freq: Once | INTRAMUSCULAR | 0 refills | Status: AC

## 2024-05-22 DIAGNOSIS — H401131 Primary open-angle glaucoma, bilateral, mild stage: Secondary | ICD-10-CM | POA: Diagnosis not present

## 2024-06-06 ENCOUNTER — Telehealth: Payer: Self-pay | Admitting: Internal Medicine

## 2024-06-06 NOTE — Telephone Encounter (Signed)
 Patient requested that his records be forwarded to [facility].

## 2024-06-09 ENCOUNTER — Other Ambulatory Visit: Payer: Self-pay | Admitting: Internal Medicine

## 2024-06-16 ENCOUNTER — Other Ambulatory Visit: Payer: Self-pay | Admitting: Internal Medicine

## 2024-06-21 ENCOUNTER — Encounter: Payer: Self-pay | Admitting: Internal Medicine

## 2024-07-03 ENCOUNTER — Encounter: Payer: Self-pay | Admitting: Internal Medicine

## 2024-07-12 ENCOUNTER — Ambulatory Visit
Admission: RE | Admit: 2024-07-12 | Discharge: 2024-07-12 | Disposition: A | Discharge status: Home / Self Care | Source: Ambulatory Visit | Attending: Gastroenterology | Admitting: Gastroenterology

## 2024-07-12 ENCOUNTER — Other Ambulatory Visit (INDEPENDENT_AMBULATORY_CARE_PROVIDER_SITE_OTHER)

## 2024-07-12 ENCOUNTER — Ambulatory Visit: Admitting: Gastroenterology

## 2024-07-12 ENCOUNTER — Encounter: Payer: Self-pay | Admitting: Gastroenterology

## 2024-07-12 VITALS — BP 146/82 | HR 67 | Ht 67.0 in | Wt 225.0 lb

## 2024-07-12 DIAGNOSIS — Z9049 Acquired absence of other specified parts of digestive tract: Secondary | ICD-10-CM | POA: Diagnosis not present

## 2024-07-12 DIAGNOSIS — Z8719 Personal history of other diseases of the digestive system: Secondary | ICD-10-CM

## 2024-07-12 DIAGNOSIS — R197 Diarrhea, unspecified: Secondary | ICD-10-CM

## 2024-07-12 DIAGNOSIS — R1030 Lower abdominal pain, unspecified: Secondary | ICD-10-CM

## 2024-07-12 DIAGNOSIS — Z9889 Other specified postprocedural states: Secondary | ICD-10-CM | POA: Diagnosis not present

## 2024-07-12 LAB — COMPREHENSIVE METABOLIC PANEL WITH GFR
ALT: 29 U/L (ref 0–53)
AST: 26 U/L (ref 0–37)
Albumin: 4.9 g/dL (ref 3.5–5.2)
Alkaline Phosphatase: 66 U/L (ref 39–117)
BUN: 16 mg/dL (ref 6–23)
CO2: 28 meq/L (ref 19–32)
Calcium: 9.8 mg/dL (ref 8.4–10.5)
Chloride: 104 meq/L (ref 96–112)
Creatinine, Ser: 1.08 mg/dL (ref 0.40–1.50)
GFR: 67.23 mL/min (ref >60.00)
Glucose, Bld: 99 mg/dL (ref 70–99)
Potassium: 4.4 meq/L (ref 3.5–5.1)
Sodium: 139 meq/L (ref 135–145)
Total Bilirubin: 0.6 mg/dL (ref 0.2–1.2)
Total Protein: 7.9 g/dL (ref 6.0–8.3)

## 2024-07-12 LAB — CBC WITH DIFFERENTIAL/PLATELET
Basophils Absolute: 0.1 K/uL (ref 0.0–0.1)
Basophils Relative: 0.9 % (ref 0.0–3.0)
Eosinophils Absolute: 0.2 K/uL (ref 0.0–0.7)
Eosinophils Relative: 2.5 % (ref 0.0–5.0)
HCT: 46.3 % (ref 39.0–52.0)
Hemoglobin: 16.3 g/dL (ref 13.0–17.0)
Lymphocytes Relative: 25.2 % (ref 12.0–46.0)
Lymphs Abs: 1.8 K/uL (ref 0.7–4.0)
MCHC: 35.1 g/dL (ref 30.0–36.0)
MCV: 93.6 fl (ref 78.0–100.0)
Monocytes Absolute: 0.8 K/uL (ref 0.1–1.0)
Monocytes Relative: 10.9 % (ref 3.0–12.0)
Neutro Abs: 4.3 K/uL (ref 1.4–7.7)
Neutrophils Relative %: 60.5 % (ref 43.0–77.0)
Platelets: 189 K/uL (ref 150.0–400.0)
RBC: 4.95 Mil/uL (ref 4.22–5.81)
RDW: 13.1 % (ref 11.5–15.5)
WBC: 7.2 K/uL (ref 4.0–10.5)

## 2024-07-12 LAB — HIGH SENSITIVITY CRP: CRP, High Sensitivity: 0.58 mg/L (ref 0.000–5.000)

## 2024-07-12 MED ORDER — CHOLESTYRAMINE 4 G PO PACK: PACK | ORAL | 1 refills | Status: DC

## 2024-07-12 NOTE — Patient Instructions (Signed)
 Your provider has requested that you go to the basement level for lab work before leaving today. Press B on the elevator. The lab is located at the first door on the left as you exit the elevator.  Also, go to our x-ray department after you leave the lab in the basement.   We have sent the following medications to your pharmacy for you to pick up at your convenience: cholestyramine.   Your provider has ordered Diatherix stool testing for you. You have received a kit from our office today containing all necessary supplies to complete this test. Please carefully read the stool collection instructions provided in the kit before opening the accompanying materials. In addition, be sure there is a label providing your full name and date of birth on the puritan opti-swab tube that is supplied in the kit (if you do not see a label with this information on your test tube, please make us  aware before test collection!). After completing the test, you should secure the purtian tube into the specimen biohazard bag. The [facility] E-Req sheet (including date and time of specimen collection) should be placed into the outside pocket of the specimen biohazard bag and returned to the [place] lab (basement floor of [facility]) within 3 days of collection. Please make sure to give the specimen to a staff member at the lab. DO NOT leave the specimen on the counter.   If the specimen date and time (can be found in the upper right boxed portion of the sheet) are not filled out on the E-Req sheet, the test will NOT be performed.   _______________________________________________________  If your blood pressure at your visit was 140/90 or greater, please contact your primary care physician to follow up on this.  _______________________________________________________  If you are age 74 or older, your body mass index should be between 23-30. Your Body mass index is 35.24 kg/m. If this is  out of the aforementioned range listed, please consider follow up with your Primary Care Provider.  If you are age 25 or younger, your body mass index should be between 19-25. Your Body mass index is 35.24 kg/m. If this is out of the aformentioned range listed, please consider follow up with your Primary Care Provider.   ________________________________________________________  The [facility] providers would like to encourage you to use MYCHART to communicate with providers for non-urgent requests or questions.  Due to long hold times on the telephone, sending your provider a message by [facility] may be a faster and more efficient way to get a response.  Please allow 48 business hours for a response.  Please remember that this is for non-urgent requests.  _______________________________________________________  [facility] is using a team-based approach to care.  Your team is made up of your doctor and two to three APPS. Our APPS (Nurse Practitioners and Physician Assistants) work with your physician to ensure care continuity for you. They are fully qualified to address your health concerns and develop a treatment plan. They communicate directly with your gastroenterologist to care for you. Seeing the Advanced Practice Practitioners on your physician's team can help you by facilitating care more promptly, often allowing for earlier appointments, access to diagnostic testing, procedures, and other specialty referrals.

## 2024-07-12 NOTE — Progress Notes (Signed)
 Discussed the use of AI scribe software for clinical note transcription with the patient, who gave verbal consent to proceed.  HPI : [name] is a 73 year old male with a history of atrial fibrillation, depression, sleep apnea, s/p cholecystectomy, s/p fundoplication and chronic back pain who presents with chronic gastrointestinal symptoms. He was referred by Dr. Tobie in August for further evaluation of his symptoms.  He was previously followed at [facility] before switching to [facility]/Dr. Cindie in 2023.  He requested to transfer back to [place].  He was last seen by Dr. Cindie in April 2024.  He has also seen [facility], although those records are not available.  He experiences chronic abdominal pain, diarrhea, and changes in bowel habits. The abdominal pain is consistently located around the periumbilical region and right side, worsening postprandially. The pain has persisted for several years, with increased severity since August, described as a burning sensation, sometimes akin to 'somebody's stabbing me'.  He has a history of diarrhea, with bowel movements occurring postprandially, approximately three times daily. Stools are often loose and poorly formed, requiring straining. No frequent urgency or accidents, with the last incident occurring a couple of years ago. Recently, he has noticed blood when wiping, but no significant blood in the stool itself.  He has undergone multiple diagnostic evaluations, including CT scans, colonoscopy, and upper endoscopy. Previous diagnoses included IBS and an ulcer. He has been treated with dicyclomine  and other medications, but reports limited relief. He has not significantly modified his diet, continuing to consume a variety of foods including eggs, cereal, sandwiches, and chicken.  He experiences bloating and a sensation of fullness postprandially, feeling as though he has eaten 'ten times as much'. He also reports nausea without  vomiting, attributed to a previous Nissen fundoplication, which prevents vomiting. He experiences dry heaves and sometimes regurgitates liquids shortly after ingestion.  No heartburn but persistent burning pain in the stomach. This burning pain is constant and never completely goes away.  He has a history of smoking, which he quit when his daughter was [age] years old, and drinks alcohol  occasionally, more so in the summer months.  Previous notes indicate he would drink 4 beers daily, but patient denies daily alcohol  use to me.     In April 2024 he was hospitalized with a small bowel obstruction.  He underwent ex-lap with LOA by Dr. Vernetta on 11/19/2022     EGD 06/18/2022 (Dr. Cindie) with prior [facility] fundoplication status post esophageal dilation.  Mild gastritis, biopsies negative for H. pylori.   Colonoscopy 06/18/2022 (Dr. Vanda healthy mucosa throughout the entire colon.  3 tubular adenomas removed.  Recall 5 years. FINAL MICROSCOPIC DIAGNOSIS:   A. STOMACH, BIOPSY:  - Antral and oxyntic mucosa with no significant pathologic changes.  - No Helicobacter pylori identified.   B. COLON, DESCENDING, POLYPECTOMY:  - Tubular adenoma (3) without high grade dysplasia.   C. COLON, SIGMOID, POLYPECTOMY:  - Hyperplastic polyp (s)   Colonoscopy January 08, 2016 (Dr. Teressa) Normal appearing colon except very mild inflammation in distal 5-6 cm of rectum.  No polyps   EGD 07/17/2005 evidence of prior antireflux surgery, hiatal hernia, otherwise normal exam    Today, main complaints are ongoing diarrhea. Also with periumbilical abdominal pain mild to moderate severity, intermittent in nature.  Some inguinal pain due to bilateral hernias.   Status postcholecystectomy years ago.   Started on dicyclomine  BID on previous visit. Notes some improvement but still having 3-4 loose  Bms some days   CT Abdomen/pelvis November 17, 2022 CLINICAL DATA:  Acute on chronic abdominal pain.  Abdominal distension, known abdominal hernia IMPRESSION: 1. Mid small bowel obstruction with a single point of transition within the periumbilical region. 2. Mild hepatic steatosis. 3. Mild sigmoid diverticulosis. 4. Moderate prostatic hypertrophy. 5. Small bilateral fat containing inguinal hernias. 6. Aortic atherosclerosis  CT Abdomen/pelvis Sep 18, 2022 CLINICAL DATA: Abdominal pain  IMPRESSION: 1. No CT evidence of acute abdominal/pelvic process. 2. Hepatic steatosis. 3. Postsurgical changes about the GE junction, likely prior Nissen fundoplication. 4. Colonic diverticulosis without evidence of acute diverticulitis. 5. Enlarged prostate with findings suggestive of prior TURP. 6. Fat containing bilateral inguinal hernias. 7. Levoscoliosis of the spine centered at L2-L3 disc space. Multilevel degenerate disc disease of the lumbar spine. 8. Aortic atherosclerosis  CT Abdomen/pelvis Oct 2023 CLINICAL DATA: Left lower quadrant abdominal pain  IMPRESSION: 1. No acute intra-abdominal or pelvic pathology. 2. Sigmoid diverticulosis. No bowel obstruction. 3. Mild fatty liver. 4.  Aortic Atherosclerosis (ICD10-I70.0)  CT abdomen/pelvis Nov 2017 CLINICAL DATA:  73 y/o M; abdominal pain, nausea, and diarrhea. History of fundoplication, appendectomy, and cholecystectomy.  IMPRESSION: 1. New herniation of a short segment of sigmoid colon into a moderate left inguinal hernia. No proximal obstruction or significant inflammatory changes of the bowel. Stable moderate right inguinal hernia containing fat. 2. Postsurgical changes related to fundoplication, cholecystectomy, and appendectomy. No hiatal hernia. 3. Aortic atherosclerosis   CT Abdomen/pelvis April 2016 CLINICAL DATA: Left lower quadrant abdominal pain for 2 weeks  IMPRESSION: No hydronephrosis or renal obstruction is noted. No renal or ureteral calculi are noted.   Stable mild bilateral fat containing inguinal  hernias.   No acute abnormality seen in the abdomen or pelvis.    CT Abdomen/pelvis May 2009 Clinical Data:  Right lower quadrant pain with bloating and bloody  stools.  Diarrhea with epigastric discomfort.  IMPRESSION:    1. No acute findings.  IMPRESSION:    1. Enlarged prostate.  2.  Small bilateral inguinal hernias contain fat.    CT Abdomen/pelvis Feb 2007 Clinical Data: Mid to low abdominal pain with elevated hemoglobin  IMPRESSION:  1. No acute abdominal findings are seen.   2. Prominent pancreas, probably a normal variant. Postoperative changes at the GE junction.  IMPRESSION:  No acute pelvic findings    CT Abdomen/pelvis May 0024 CLINICAL DATA:    ABDOMINAL PAIN.  IMPRESSION  NEGATIVE CT SCAN OF THE ABDOMEN WITH CONTRAST   IMPRESSION  1.  NO EVIDENCE FOR INFLAMMATORY OR MALIGNANT DISEASE WITHIN THE PELVIS.  2.   BILATERAL PARS DEFECTS OF L5.  3.   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   Cataracts, both eyes    worst on left   Colon polyps    Complication of anesthesia    pt had spinal for a knee replacement--woke up during surgery--and sick after surgery   Depression    Diverticulosis    DVT (deep venous thrombosis) (HCC) 2001 or 2002  right leg-required hospitalization x 8 days   DVT (deep venous thrombosis) (HCC) 1996   right   GERD (gastroesophageal reflux disease) 11/18/2022   Glaucoma    both eyes   H/O hiatal hernia    Hearing impaired person, bilateral    bilateral   Hemorrhoid    History of IBS    History of kidney stones    x 1-passed    Hyperlipidemia 2012   Hypertension    Lumbago    Lumbar post-laminectomy syndrome    Myocardial infarction [facility]) December 2019   PAF (paroxysmal atrial fibrillation) (HCC)    PONV (postoperative nausea and vomiting)    Sleep apnea    pt uses cpap - setting of 16   Ulcer 1966   patient denies   Ulcerative colitis (HCC)    patient denies   Wears glasses    Wears hearing aid      Past Surgical History:  Procedure Laterality Date   APPENDECTOMY  1993   BACK SURGERY  2008   bone spur removed    BALLOON DILATION N/A 06/18/2022   Procedure: BALLOON DILATION;  Surgeon: Cindie Carlin POUR, DO;  [facility];  Service: Endoscopy;  Laterality: N/A;   bilateral carpal tunnel release 2009     BIOPSY  06/18/2022   Procedure: BIOPSY;  Surgeon: Cindie Carlin POUR, DO;  [facility];  Service: Endoscopy;;   cervical fusion 2011--pt has good range of motion neck     CHOLECYSTECTOMY  1994   COLONOSCOPY WITH PROPOFOL  N/A 06/18/2022   Procedure: COLONOSCOPY WITH PROPOFOL ;  Surgeon: Cindie Carlin POUR, DO;  [facility];  Service: Endoscopy;  Laterality: N/A;  8:15 AM   ESOPHAGOGASTRODUODENOSCOPY (EGD) WITH PROPOFOL  N/A 06/18/2022   Procedure: ESOPHAGOGASTRODUODENOSCOPY (EGD) WITH PROPOFOL ;  Surgeon: Cindie Carlin POUR, DO;  [facility];  Service: Endoscopy;  Laterality: N/A;   EYE SURGERY N/A    Phreesia 07/13/2020   HERNIA REPAIR  2008   INSERTION OF MESH Bilateral 07/31/2016   Procedure: INSERTION OF MESH;  Surgeon: Donnice Lunger, MD;  Location: [facility];  Service: General;  Laterality: Bilateral;   JOINT REPLACEMENT Bilateral    2001 left total knee and 1985 right total knee   LAPAROSCOPY N/A 11/19/2022   Procedure: LAPAROSCOPY DIAGNOSTIC;  Surgeon: Vernetta Berg, MD;  Location: [facility] OR;  Service: General;  Laterality: N/A;   LUMBAR LAMINECTOMY/DECOMPRESSION MICRODISCECTOMY N/A 07/24/2021   Procedure: DECOMPRESSION AND REMOVAL OF CYST LUMBAR FIVE  THROUGH SACRAL ONE;  Surgeon: Burnetta Aures, MD;  Location: [facility] OR;  Service: Orthopedics;  Laterality: N/A;   LYSIS OF ADHESION  11/19/2022   Procedure: LAPAROSCOPIC LYSIS OF ADHESION;  Surgeon: Vernetta Berg, MD;  Location: [facility] OR;  Service: General;;   MASS EXCISION Left 01/04/2014   Procedure: LEFT MIDDLE FINGER MASS EXCISION WITH NAILBED REPAIR AND ADVANCEMENT FLAP;  Surgeon: Elsie Mussel, MD;  Location: [facility];  Service: Orthopedics;  Laterality: Left;   mass removed from 2nd finger Left 03/2013   squamous carcinoma   nissen fundoplication 1995     POLYPECTOMY  06/18/2022   Procedure: POLYPECTOMY;  Surgeon: Cindie Carlin POUR, DO;  [facility];  Service: Endoscopy;;   PROSTATE SURGERY  2014   TURP   right wrist fusion 12/12 - pt wearing brace on the wrist-not started physical therapy yet Right    fusion prevent limited ROM to right wrist   SPINE SURGERY  2009   cervical spine  fusion   thyroid  nodule     removed   TOTAL KNEE REVISION Right 11/17/2013   Procedure: RIGHT KNEE POLYETHYLENE REVISION, bone graft;  Surgeon: Dempsey LULLA Moan, MD;  Location: [facility];  Service: Orthopedics;  Laterality: Right;   TOTAL SHOULDER ARTHROPLASTY Right 01/28/2018   Procedure: RIGHT TOTAL SHOULDER ARTHROPLASTY;  Surgeon: Kay Kemps, MD;  Location[facility];  Service: Orthopedics;  Laterality: Right;   TOTAL SHOULDER ARTHROPLASTY Left 07/15/2018   Procedure: LEFT SHOULDER ANATOMIC TOTAL SHOULDER ARTHROPLASTY;  Surgeon: Kay Kemps, MD;  Location: [facility] OR;  Service: Orthopedics;  Laterality: Left;   TRANSURETHRAL RESECTION OF PROSTATE  10/09/2011   Procedure: TRANSURETHRAL RESECTION OF THE PROSTATE WITH GYRUS INSTRUMENTS;  Surgeon: Donnice Gwenyth Brooks, MD;  Location: [facility];  Service: Urology;  Laterality: N/A;   uvvvp     XI ROBOTIC ASSISTED INGUINAL HERNIA REPAIR WITH MESH Bilateral 07/31/2016   Procedure: XI ROBOTIC BILATERAL INGUINAL HERNIA REPAIR WITH MESH;   Surgeon: Donnice Lunger, MD;  Location: [facility];  Service: General;  Laterality: Bilateral;   Family History  Problem Relation Age of Onset   Arthritis Mother    Hearing loss Mother    Hyperlipidemia Mother    Hypertension Mother    Deep vein thrombosis Mother    Bone cancer Father        deceased   Cancer Father    Diabetes Father    Prostate cancer Father    Ulcerative colitis Father    Diabetes Sister    Hearing loss Maternal Grandmother    Hearing loss Maternal Grandfather    Diabetes Paternal Grandfather    Social History   Tobacco Use   Smoking status: Former    Current packs/day: 0.00    Average packs/day: 1 pack/day for 15.0 years (15.0 ttl pk-yrs)    Types: Cigarettes    Start date: 08/03/1968    Quit date: 08/04/1983    Years since quitting: 40.9   Smokeless tobacco: Never   Tobacco comments:    quit smoking 1985  Vaping Use   Vaping status: Never Used  Substance Use Topics   Alcohol  use: Yes    Alcohol /week: 3.0 standard drinks of alcohol     Types: 3 Cans of beer per week    Comment: 4 beers a week   Drug use: No   Current Outpatient Medications  Medication Sig Dispense Refill   acetaminophen  (TYLENOL ) 500 MG tablet Take 500 mg by mouth every 6 (six) hours as needed for mild pain or headache.     albuterol  (VENTOLIN  HFA) 108 (90 Base) MCG/ACT inhaler Inhale 2 puffs into the lungs every 4 (four) hours as needed for wheezing or shortness of breath. 18 g 2   atorvastatin  (LIPITOR) 40 MG tablet TAKE 1 TABLET BY MOUTH DAILY 100 tablet 2   Calcium  Carb-Cholecalciferol (CALCIUM  + D3 PO) Take 1 tablet by mouth at bedtime.     dicyclomine  (BENTYL ) 10 MG capsule Take 1 capsule (10 mg total) by mouth 4 (four) times daily -  before meals and at bedtime. 60 capsule 1   diphenhydrAMINE  (BENADRYL ) 25 mg capsule Take 25 mg by mouth daily as needed for allergies.     esomeprazole  (NEXIUM ) 40 MG capsule Take 1 capsule (40 mg total) by mouth daily at 12 noon. 30 capsule 11    fluticasone  (FLONASE ) 50 MCG/ACT nasal spray Place 1 spray into both nostrils at bedtime as needed for allergies.     hydroxypropyl methylcellulose / hypromellose (ISOPTO TEARS /  GONIOVISC) 2.5 % ophthalmic solution Place 1 drop into both eyes 3 (three) times daily as needed for dry eyes.     latanoprost  (XALATAN ) 0.005 % ophthalmic solution Place 1 drop into both eyes at bedtime.     meclizine  (ANTIVERT ) 25 MG tablet TAKE 1 TABLET(25 MG) BY MOUTH THREE TIMES DAILY AS NEEDED FOR DIZZINESS 30 tablet 0   metoprolol  tartrate (LOPRESSOR ) 25 MG tablet Take 25 mg by mouth See admin instructions. Take 25 mg by mouth one to three times a day when in A-Fib (call 9-1-1 if no relief after three doses)     Multiple Vitamin (MULTIVITAMIN WITH MINERALS) TABS tablet Take 1 tablet by mouth at bedtime.     OVER THE COUNTER MEDICATION Take 1-2 tablets by mouth See admin instructions. Tylenol  for Tension headaches- Take 1-2 tablets by mouth every 6 hours as needed for headaches     PRESCRIPTION MEDICATION Inhale into the lungs See admin instructions. CPAP- At bedtime     rivaroxaban  (XARELTO ) 20 MG TABS tablet TAKE 1 TABLET BY MOUTH DAILY  WITH SUPPER 100 tablet 1   sodium chloride  (OCEAN) 0.65 % SOLN nasal spray Place 1 spray into both nostrils as needed for congestion. 88 mL 0   traZODone  (DESYREL ) 50 MG tablet Take 100 mg by mouth at bedtime.     TRINTELLIX  10 MG TABS tablet TAKE ONE TABLET BY MOUTH EVERY NIGHT AT BEDTIME 90 tablet 2   No current facility-administered medications for this visit.   Allergies  Allergen Reactions   Latex Rash and Other (See Comments)    CONDOM CATHETER- SEVERE REACTION!!!!!   Adhesive [Tape] Other (See Comments)    Causes blisters - pls use paper tape     Review of Systems: All systems reviewed and negative except where noted in HPI.    No results found.  Physical Exam: BP (!) 146/82   Pulse 67   Ht 5' 7 (1.702 m)   Wt 225 lb (102.1 kg)   SpO2 97%   BMI 35.24 kg/m   Constitutional: Pleasant,well-developed, Caucasian male in no acute distress. HEENT: Normocephalic and atraumatic. Conjunctivae are normal. No scleral icterus. Neck supple.  Cardiovascular: Normal rate, regular rhythm.  Pulmonary/chest: Effort normal and breath sounds normal. No wheezing, rales or rhonchi. Abdominal: Soft, nondistended, mild, multifocal tenderness to palpation in the b/l lower quadrants without rigidity or guarding. Bowel sounds active throughout. There are no masses palpable. No hepatomegaly. Extremities: no edema Neurological: Alert and oriented to person place and time. Skin: Skin is warm and dry. No rashes noted. Psychiatric: Normal mood and affect. Behavior is normal.  CBC    Component Value Date/Time   WBC 8.0 08/10/2023 1114   WBC 8.8 11/20/2022 0148   RBC 4.88 08/10/2023 1114   RBC 4.64 11/20/2022 0148   HGB 15.9 08/10/2023 1114   HCT 46.6 08/10/2023 1114   PLT 197 08/10/2023 1114   MCV 96 08/10/2023 1114   MCH 32.6 08/10/2023 1114   MCH 32.1 11/20/2022 0148   MCHC 34.1 08/10/2023 1114   MCHC 36.2 (H) 11/20/2022 0148   RDW 12.5 08/10/2023 1114   LYMPHSABS 1.9 08/10/2023 1114   MONOABS 0.9 09/18/2022 1431   EOSABS 0.2 08/10/2023 1114   BASOSABS 0.1 08/10/2023 1114    CMP     Component Value Date/Time   NA 141 08/10/2023 1114   K 4.1 08/10/2023 1114   CL 103 08/10/2023 1114   CO2 25 08/10/2023 1114   GLUCOSE 106 (H) 08/10/2023  1114   GLUCOSE 110 (H) 11/20/2022 0148   BUN 20 08/10/2023 1114   CREATININE 1.13 08/10/2023 1114   CREATININE 1.19 05/21/2022 1518   CALCIUM  9.5 08/10/2023 1114   PROT 7.2 08/10/2023 1114   ALBUMIN 4.8 08/10/2023 1114   AST 28 08/10/2023 1114   ALT 30 08/10/2023 1114   ALKPHOS 89 08/10/2023 1114   BILITOT 0.5 08/10/2023 1114   GFRNONAA >60 11/20/2022 0148   GFRNONAA 66 07/11/2020 0824   GFRAA 76 07/11/2020 0824       Latest Ref Rng & Units 08/10/2023   11:14 AM 11/20/2022    1:48 AM 11/19/2022   12:37 AM  CBC  EXTENDED  WBC 3.4 - 10.8 x10E3/uL 8.0  8.8  12.3   RBC 4.14 - 5.80 x10E6/uL 4.88  4.64  5.26   Hemoglobin 13.0 - 17.7 g/dL 84.0  85.0  83.2   HCT 37.5 - 51.0 % 46.6  41.2  48.0   Platelets 150 - 450 x10E3/uL 197  157  188   NEUT# 1.4 - 7.0 x10E3/uL 4.9     Lymph# 0.7 - 3.1 x10E3/uL 1.9         ASSESSMENT AND PLAN:  73 year old male with chronic abdominal pain and diarrhea going back 20 years, with recent admission for small bowel obstruction.  When asked what his symptoms were like when diagnosed with the bowel obstruction, he states they weren't any different than his current symptoms.  He is unable to vomit since his fundoplication.  The patient's chronic symptoms are very consistent with diarrhea-predominant IBS.  We discussed the pathophysiology of IBS and gut-brain axis disorders.  We discussed how this can be treated with medications and/or dietary modifications. Although it is not clear his diarrhea started after his cholecystectomy, he may have an element of bile-acid diarrhea. Recommended he start cholestyramine, 1-2x/day. Will order additional tests to exclude other causes of chronic abdominal pain that can mimic IBS to include celiac disease, EPI, alpha gal syndrome and Crohn's disease. As the patient is concerned about possible SBO, will order abdominal film.  His exam and clinical presentation are not suggestive of SBO.    Irritable bowel syndrome with diarrhea Chronic abdominal pain and diarrhea, likely IBS. Differential includes celiac disease, Crohn's disease, pancreatic insufficiency, and alpha-gal syndrome. Previous dicyclomine  ineffective. Current symptoms include straining and loose stools. - Ordered blood test for celiac disease. - Ordered fecal calprotectin to evaluate for Crohn's disease. - Ordered test for pancreatic insufficiency. - Ordered blood test for alpha-gal syndrome. - Prescribed cholestyramine once daily with meals. - Ordered plain abdominal film to rule  out obstruction. - Scheduled follow-up in 6-8 weeks to assess treatment response and consider rifaximin if tests negative and symptoms persist.  Patient may also benefit from trial of low FODMAP diet.  Postcholecystectomy syndrome Abdominal pain and diarrhea possibly due to bile acid malabsorption post-cholecystectomy. - Prescribed cholestyramine once daily with meals.  Recording duration: 20 minutes     I spent a total of 55 minutes reviewing the patient's medical record including his numerous CT scans and previous GI notes, interviewing and examining the patient, discussing his diagnosis and management of his condition going forward, and documenting in the medical record   Prisila Dlouhy E. Stacia, MD [facility]   Tobie Suzzane POUR, MD

## 2024-07-14 ENCOUNTER — Other Ambulatory Visit (INDEPENDENT_AMBULATORY_CARE_PROVIDER_SITE_OTHER)

## 2024-07-14 DIAGNOSIS — R1030 Lower abdominal pain, unspecified: Secondary | ICD-10-CM

## 2024-07-14 DIAGNOSIS — R197 Diarrhea, unspecified: Secondary | ICD-10-CM

## 2024-07-16 LAB — TISSUE TRANSGLUTAMINASE, IGA: (tTG) Ab, IgA: <1 U/mL

## 2024-07-16 LAB — INTERPRETATION:

## 2024-07-16 LAB — ALPHA-GAL PANEL
Allergen, Mutton, f88: <0.1 kU/L
Allergen, Pork, f26: <0.1 kU/L
Beef: <0.1 kU/L
CLASS: 0
CLASS: 0
Class: 0
GALACTOSE-ALPHA-1,3-GALACTOSE IGE*: 0.15 kU/L — ABNORMAL HIGH (ref <0.10)

## 2024-07-16 LAB — IGA: Immunoglobulin A: 286 mg/dL (ref 70–320)

## 2024-07-17 ENCOUNTER — Ambulatory Visit: Payer: Self-pay | Admitting: Gastroenterology

## 2024-07-17 ENCOUNTER — Encounter: Payer: Self-pay | Admitting: Gastroenterology

## 2024-07-17 DIAGNOSIS — R935 Abnormal findings on diagnostic imaging of other abdominal regions, including retroperitoneum: Secondary | ICD-10-CM

## 2024-07-17 DIAGNOSIS — R1030 Lower abdominal pain, unspecified: Secondary | ICD-10-CM

## 2024-07-17 DIAGNOSIS — Z8719 Personal history of other diseases of the digestive system: Secondary | ICD-10-CM

## 2024-07-17 NOTE — Progress Notes (Signed)
 Mr. [name],  The x-ray of your abdomen last week did show some dilated loops of small bowel which could indicate some degree of obstruction. I would recommend a CT of the abdomen and pelvis with contrast to be assess whether there may be a focal area of obstruction.  I would recommend you follow a 'low-residue' diet which involves avoiding foods that have high amounts of roughage or fiber content.  Team, Please order a CT of the abdomen/pelvis with IV/oral contrast and provide a handout on a low residue/low fiber diet

## 2024-07-18 ENCOUNTER — Telehealth: Payer: Self-pay | Admitting: Internal Medicine

## 2024-07-18 NOTE — Telephone Encounter (Signed)
 Social Security Benefit forms  Noted Copied Sleeved Original placed provider box Mathias) Copy placed front desk folder  Patient asked to fax and would like the original copy back for pick up.

## 2024-07-19 NOTE — Progress Notes (Signed)
 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  This may indicate that your symptoms are from a condition call alpha gal syndrome.  This is a condition which the immune system is reacting to molecule associated with mammalian food products (mainly beef, lamb and pork).  This condition is transmitted via a tick bite.    I think your symptoms will get better if you eliminate beef and pork from your diet.  It can take several weeks or even months before symptoms completely resolve.  Some patients are even sensitive to dairy.    For now, I recommend you eliminate beef and pork completely from your diet. Chicken/turkey and fish are fine.  Please follow up with me next month as scheduled to discuss this further.

## 2024-07-20 ENCOUNTER — Encounter (HOSPITAL_BASED_OUTPATIENT_CLINIC_OR_DEPARTMENT_OTHER): Payer: Self-pay

## 2024-07-21 LAB — CALPROTECTIN: Calprotectin: 11 ug/g

## 2024-07-21 LAB — PANCREATIC ELASTASE, FECAL: Pancreatic Elastase-1, Stool: >800 ug/g

## 2024-07-22 ENCOUNTER — Ambulatory Visit (HOSPITAL_BASED_OUTPATIENT_CLINIC_OR_DEPARTMENT_OTHER)
Admission: RE | Admit: 2024-07-22 | Discharge: 2024-07-22 | Disposition: A | Discharge status: Home / Self Care | Source: Ambulatory Visit | Attending: Gastroenterology | Admitting: Gastroenterology

## 2024-07-22 DIAGNOSIS — Z8719 Personal history of other diseases of the digestive system: Secondary | ICD-10-CM | POA: Insufficient documentation

## 2024-07-22 DIAGNOSIS — R935 Abnormal findings on diagnostic imaging of other abdominal regions, including retroperitoneum: Secondary | ICD-10-CM | POA: Insufficient documentation

## 2024-07-22 DIAGNOSIS — R1030 Lower abdominal pain, unspecified: Secondary | ICD-10-CM | POA: Diagnosis present

## 2024-07-22 MED ORDER — IOHEXOL 300 MG/ML  SOLN
100.0000 mL | Freq: Once | INTRAMUSCULAR | Status: AC | PRN
  Administered 2024-07-22: 100 mL via INTRAVENOUS

## 2024-07-24 NOTE — Progress Notes (Signed)
 Mr. Foisy,  Your fecal calprotectin was normal, indicating there is likely no inflammatory process in your GI tract.  This makes Crohn's diesease and ulcerative colitis very unlikely. Your elastase level was normal.  This makes chronic pancreatitis and exocrine pancreatic insufficiency very unlikely. Please eliminate beef and pork from your diet as previously discussed.  Will await results from the CT scan

## 2024-07-25 ENCOUNTER — Other Ambulatory Visit: Payer: Self-pay | Admitting: Internal Medicine

## 2024-07-25 DIAGNOSIS — I480 Paroxysmal atrial fibrillation: Secondary | ICD-10-CM

## 2024-07-25 DIAGNOSIS — I2510 Atherosclerotic heart disease of native coronary artery without angina pectoris: Secondary | ICD-10-CM

## 2024-07-25 DIAGNOSIS — F339 Major depressive disorder, recurrent, unspecified: Secondary | ICD-10-CM

## 2024-07-28 ENCOUNTER — Ambulatory Visit: Payer: Self-pay | Admitting: Gastroenterology

## 2024-07-28 NOTE — Progress Notes (Signed)
 [name],  Your CT was normal.  There was no evidence of bowel obstruction or inflammatory changes in the abdomen.  It is possible that you are having intermittent partial obstruction related to scar tissue in the abdomen, although this does not cause a constant pain like you describe.  I recommend you eliminate red meat as previously discussed, given the positive results on the alpha gal panel.  Follow up with me next month as scheduled.

## 2024-08-04 ENCOUNTER — Telehealth: Payer: Self-pay

## 2024-08-04 NOTE — Progress Notes (Signed)
 Care Guide Pharmacy Note  08/04/2024 Name: [name] MRN: [id number] DOB: 25-Sep-1950  Referred By: Tobie Suzzane POUR, MD Reason for referral: Complex Care Management (Outreach to schedule with pharm d)   [name] is a 74 y.o. year old male who is a primary care patient of Tobie Suzzane POUR, MD.  [name] was referred to the pharmacist for assistance related to: Atrial Fibrillation, HTN, and Depression  Successful contact was made with the patient to discuss pharmacy services including being ready for the pharmacist to call at least 5 minutes before the scheduled appointment time and to have medication bottles and any blood pressure readings ready for review. The patient agreed to meet with the pharmacist via telephone visit on (date/time).1.15.2026  [name] , RMA       [facility] Guide  Direct Dial: [phone]  Website: [email or web address]

## 2024-08-17 ENCOUNTER — Other Ambulatory Visit: Payer: Self-pay

## 2024-08-17 DIAGNOSIS — F339 Major depressive disorder, recurrent, unspecified: Secondary | ICD-10-CM

## 2024-08-17 DIAGNOSIS — I480 Paroxysmal atrial fibrillation: Secondary | ICD-10-CM

## 2024-08-17 NOTE — Progress Notes (Signed)
" ° °  08/17/2024 Name: ROURKE MCQUITTY MRN: [id number] DOB: 1951-02-27  Chief Complaint  Patient presents with   Medication Assistance    FREDERIK STANDLEY is a 74 y.o. year old male who presented for a telephone visit.   They were referred to the pharmacist by their PCP for assistance in managing medication access.    Subjective:  Care Team: Primary Care Provider: Tobie Suzzane POUR, MD ; Next Scheduled Visit:  Future Appointments  Date Time Provider Department Center  08/24/2024  9:00 AM Lonni Slain, MD [facility]-CVD [address]  08/24/2024 10:50 AM Stacia Glendia BRAVO, MD [facility]  10/10/2024 10:00 AM Tobie Suzzane POUR, MD [facility]-RPC [phone]    Medication Access/Adherence  Current Pharmacy:  [facility] [phone] - [place], [place] - [address] AT [facility] [address] [place] [address] Phone: [phone] Fax: [phone]  [facility] - [place], [place] - [address] [place] [address] Phone: [phone] Fax: [phone]  [facility] [address], [place] - [address] [place] [address] [place] [address] [place] [address] Phone: [phone] Fax: [phone]  [facility] - [place], [place] - [address] [place] [address] Phone: [phone] Fax: [phone]   Patient reports affordability concerns with their medications: Yes  Patient reports access/transportation concerns to their pharmacy: No  Patient reports adherence concerns with their medications:  No other than cost concerns with Xarelto  and Trintellix    Notes that he previously qualified for Trintellix  PAP and has not pursue Xarelto  PAP over past few years.    Objective:  Lab Results  Component Value Date   HGBA1C 5.6 08/10/2023    Lab Results  Component Value Date   CREATININE 1.08 07/12/2024   BUN 16 07/12/2024   NA 139 07/12/2024   K 4.4 07/12/2024   CL 104 07/12/2024    CO2 28 07/12/2024    Lab Results  Component Value Date   CHOL 130 01/18/2024   HDL 50 01/18/2024   LDLCALC 67 01/18/2024   TRIG 58 01/18/2024   CHOLHDL 2.6 01/18/2024   Assessment/Plan:   Medication Assistance: - reviewed insurance coverage related to Xarelto  and Trintellix , and general plan information - UHC HMO-POS ([id number]) - Rx deductible $440. Example pharmacy coverage stated below  - previously has qualified for trintellix  through takeda help at hand - reviewed patient assistance and will coordinate with CPhT to have sent out - reviewed patient assistance for xarelto  - current household income <50k annual so may not need to show proof of out of pocket spending.   Follow Up Plan: plan to pursue patient assistance for xarelto  and trintellix , will coordinate with Pt advocate to have processed.   Lang Sieve, PharmD, BCGP Clinical Pharmacist  [phone]   "

## 2024-08-22 NOTE — Telephone Encounter (Signed)
 Spouse picked up form.

## 2024-08-23 ENCOUNTER — Ambulatory Visit: Admitting: Gastroenterology

## 2024-08-23 NOTE — Progress Notes (Signed)
 " Cardiology Office Note:  .   Date:  08/24/2024  ID:  [name], DOB Nov 23, 1950, MRN [id number] PCP: Tobie Suzzane POUR, MD  [facility] Providers Cardiologist:  Shelda Bruckner, MD {  History of Present Illness: .   [name] is a 74 y.o. male with a hx of nonobstructive CAD, hypertension, hyperlipidemia, prior atrial fibrillation with RVR, history of GERD s/p Nissen fundiplication and SBO 2024 s/p lysis of adhesions who is seen in follow up for the evaluation and management of afib, hypertension, hyperlipidemia   Pertinent CV history: hospitalized in 07/2018 due to complications from urinary catheter placed during recent shoulder surgery, but he was also found to have new onset atrial fibrillation with RVR. He spontaneously converted to NSR. He was discharged on rivaroxaban . Coronary CT 2020 minimal nonobstructive CAD, Ca score 236 (59th %ile). Monitor 2019 with afib. Echo 2018 EF 60-65%, G2DD, no significant valve disease.  Today: Has been told he has alpha-gal based on testing, avoiding meat and dairy products. Has follow up with GI upcoming. He continues to have pain with eating despite changing his diet.  Has not felt any afib. No chest pain or shortness of breath. Trying to get medication cost help for xarelto  and trintellix .  ROS: Denies other chest pain, shortness of breath at rest or with normal exertion. No PND, orthopnea, LE edema or unexpected weight gain. No syncope or palpitations. ROS otherwise negative except as noted.   Studies Reviewed: SABRA    EKG:  EKG Interpretation Date/Time:  Thursday August 24 2024 09:03:45 EST Ventricular Rate:  66 PR Interval:  140 QRS Duration:  70 QT Interval:  392 QTC Calculation: 410 R Axis:   15  Text Interpretation: Normal sinus rhythm Normal ECG When compared with ECG of 18-Nov-2022 07:36, No significant change was found Confirmed by Bruckner Shelda [phone]) on 08/24/2024 9:33:46 AM    Physical Exam:   VS:  BP  124/74   Pulse (!) 58   Ht 5' 7 (1.702 m)   Wt 226 lb 11.2 oz (102.8 kg)   SpO2 98%   BMI 35.51 kg/m    Wt Readings from Last 3 Encounters:  08/24/24 226 lb 11.2 oz (102.8 kg)  07/12/24 225 lb (102.1 kg)  03/30/24 223 lb (101.2 kg)    GEN: Well nourished, well developed in no acute distress HEENT: Normal, moist mucous membranes NECK: No JVD CARDIAC: regular rhythm, normal S1 and S2, no rubs or gallops. No murmur. VASCULAR: Radial and DP pulses 2+ bilaterally. No carotid bruits RESPIRATORY:  Clear to auscultation without rales, wheezing or rhonchi  ABDOMEN: Soft, non-tender, non-distended MUSCULOSKELETAL:  Ambulates independently SKIN: Warm and dry, no edema NEUROLOGIC:  Alert and oriented x 3. No focal neuro deficits noted. PSYCHIATRIC:  Normal affect    ASSESSMENT AND PLAN: .    Nonobstructive CAD Hypercholesterolemia -on atorvastatin  40 mg daily -LDL goal <70 with nonobstructive CAD. Most recent 22 on 01/18/24 -no aspirin  as he is on rivaroxaban  -counseled on red flag warning signs that need immediate medical attention   Atrial fibrillation, paroxysmal with RVR in hospital and spontaneous conversion: -CHA2DS2/VAS Stroke Risk Points=3 (HTN, HLD, age), secondary hypercoagulable state  -on rivaroxaban  for anticoagulation. Tolerating well. -regular rhythm today -asymptomatic -has instructions for PRN metoprolol    Hypertension, history of (not current): -at goal of <130/80 on no medications -if elevates above goal in the future, did well on lisinopril , would restart  CV risk counseling and prevention -recommend heart healthy/Mediterranean diet,  with whole grains, fruits, vegetable, fish, lean meats, nuts, and olive oil. Limit salt. -recommend moderate walking, 3-5 times/week for 30-50 minutes each session. Aim for at least 150 minutes.week. Goal should be pace of 3 miles/hours, or walking 1.5 miles in 30 minutes -recommend avoidance of tobacco products. Avoid excess  alcohol .  Dispo: 1 year or sooner as needed  Signed, Shelda Bruckner, MD   Shelda Bruckner, MD, PhD, [facility] [place]  [facility]  [place]  Heart & Vascular at [facility] at [facility] [address] [place], [place] [address] [phone]   "

## 2024-08-24 ENCOUNTER — Encounter (HOSPITAL_BASED_OUTPATIENT_CLINIC_OR_DEPARTMENT_OTHER): Payer: Self-pay | Admitting: Cardiology

## 2024-08-24 ENCOUNTER — Ambulatory Visit (HOSPITAL_BASED_OUTPATIENT_CLINIC_OR_DEPARTMENT_OTHER): Admitting: Cardiology

## 2024-08-24 ENCOUNTER — Ambulatory Visit: Admitting: Gastroenterology

## 2024-08-24 ENCOUNTER — Encounter: Payer: Self-pay | Admitting: Gastroenterology

## 2024-08-24 VITALS — BP 118/62 | HR 62 | Ht 67.0 in | Wt 224.0 lb

## 2024-08-24 VITALS — BP 124/74 | HR 58 | Ht 67.0 in | Wt 226.7 lb

## 2024-08-24 DIAGNOSIS — R109 Unspecified abdominal pain: Secondary | ICD-10-CM

## 2024-08-24 DIAGNOSIS — I480 Paroxysmal atrial fibrillation: Secondary | ICD-10-CM | POA: Diagnosis not present

## 2024-08-24 DIAGNOSIS — K580 Irritable bowel syndrome with diarrhea: Secondary | ICD-10-CM | POA: Diagnosis not present

## 2024-08-24 DIAGNOSIS — Z7189 Other specified counseling: Secondary | ICD-10-CM

## 2024-08-24 DIAGNOSIS — Z7901 Long term (current) use of anticoagulants: Secondary | ICD-10-CM

## 2024-08-24 DIAGNOSIS — I2510 Atherosclerotic heart disease of native coronary artery without angina pectoris: Secondary | ICD-10-CM

## 2024-08-24 DIAGNOSIS — E7800 Pure hypercholesterolemia, unspecified: Secondary | ICD-10-CM

## 2024-08-24 DIAGNOSIS — D6869 Other thrombophilia: Secondary | ICD-10-CM | POA: Diagnosis not present

## 2024-08-24 DIAGNOSIS — K529 Noninfective gastroenteritis and colitis, unspecified: Secondary | ICD-10-CM

## 2024-08-24 MED ORDER — AMITRIPTYLINE HCL 10 MG PO TABS: ORAL_TABLET | ORAL | 0 refills | Status: AC

## 2024-08-24 MED ORDER — CHOLESTYRAMINE 4 G PO PACK: PACK | ORAL | 1 refills | Status: AC

## 2024-08-24 NOTE — Patient Instructions (Addendum)
 We have sent the following medications to your pharmacy for you to pick up at your convenience: Elavil  10 mg -1 tablet at bedtime for 2 weeks, then may increase to 2 tablets at bedtime, as tolerated.  Cholestyramine  - 1/2 packet as directed.   Follow-up on 10/23/24 at 10:10 am with Dr Stacia.   _______________________________________________________  If your blood pressure at your visit was 140/90 or greater, please contact your primary care physician to follow up on this.  _______________________________________________________  If you are age 76 or older, your body mass index should be between 23-30. Your Body mass index is 35.08 kg/m. If this is out of the aforementioned range listed, please consider follow up with your Primary Care Provider.  If you are age 76 or younger, your body mass index should be between 19-25. Your Body mass index is 35.08 kg/m. If this is out of the aformentioned range listed, please consider follow up with your Primary Care Provider.   ________________________________________________________  The [facility] providers would like to encourage you to use MYCHART to communicate with providers for non-urgent requests or questions.  Due to long hold times on the telephone, sending your provider a message by [facility] may be a faster and more efficient way to get a response.  Please allow 48 business hours for a response.  Please remember that this is for non-urgent requests.  _______________________________________________________  [facility] is using a team-based approach to care.  Your team is made up of your doctor and two to three APPS. Our APPS (Nurse Practitioners and Physician Assistants) work with your physician to ensure care continuity for you. They are fully qualified to address your health concerns and develop a treatment plan. They communicate directly with your gastroenterologist to care for you. Seeing the Advanced Practice Practitioners  on your physician's team can help you by facilitating care more promptly, often allowing for earlier appointments, access to diagnostic testing, procedures, and other specialty referrals.   Thank you for choosing me and [facility].  Dr. Glendia Stacia

## 2024-08-24 NOTE — Progress Notes (Signed)
 "  Discussed the use of AI scribe software for clinical note transcription with the patient, who gave verbal consent to proceed.  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  He was last seen by me Dec 10th, at which time an alpha gal panel was ordered and was weakly positive.  He was advised to eliminate red meat/pork which he has done. A CT was also obtained, as the patient reports having symptoms similar to his bowel obstruction.  The CT was unremarkable. He was started on cholestyramine  for his diarrhea.  Chronic abdominal pain and diarrhea have persisted for years, with worsening symptoms in December characterized by frequent diarrhea and constant pain. Diarrhea has since slowed, and stools are beginning to form but remain mushy. Pain is present on both the left and right sides of the abdomen, often described as twisted, burning, and tender, particularly in the center. Symptoms typically begin five to ten minutes after eating and are not relieved by bowel movements. The right side of the abdomen appears swollen, feels hot, and is larger than the left. These symptoms have caused significant distress and frustration, with notable impact on quality of life.  Diet has excluded mammalian meat for about a month following a barely positive alpha-gal panel, with intake limited to turkey, chicken, and fish. No major improvement in symptoms has been noted since starting this diet.   Cholestyramine  powder was previously used for diarrhea but discontinued due to severe constipation, even at one packet daily. He prefers more frequent bowel movements. No improvement in pain was noted with cholestyramine . Dicyclomine  was prescribed but not yet tried. He has also been prescribed purple pills for gastrointestinal symptoms but does not  recall the name. Current medications include trazodone  for depression.  History includes a surgically treated bowel obstruction in April, with temporary resolution of pain postoperatively and recurrence a few months later. Multiple colonic polyps have been removed during colonoscopy. At age 19, he experienced severe diarrhea for a week after drinking from a stream, described as dysentery.     In April 2024 he was hospitalized with a small bowel obstruction.  He underwent ex-lap with LOA by Dr. Vernetta on 11/19/2022       EGD 06/18/2022 (Dr. Cindie) with prior [facility] fundoplication status post esophageal dilation.  Mild gastritis, biopsies negative for H. pylori.   Colonoscopy 06/18/2022 (Dr. Vanda healthy mucosa throughout the entire colon.  3 tubular adenomas removed.  Recall 5 years. FINAL MICROSCOPIC DIAGNOSIS:   A. STOMACH, BIOPSY:  - Antral and oxyntic mucosa with no significant pathologic changes.  - No Helicobacter pylori identified.   B. COLON, DESCENDING, POLYPECTOMY:  - Tubular adenoma (3) without high grade dysplasia.   C. COLON, SIGMOID, POLYPECTOMY:  - Hyperplastic polyp (s)    Colonoscopy January 08, 2016 (Dr. Teressa) Normal appearing colon except very mild inflammation in distal 5-6 cm of rectum.  No polyps    EGD 07/17/2005 evidence of prior antireflux surgery, hiatal hernia, otherwise normal exam     CT Abdomen/pelvis Jul 25, 2024 IMPRESSION: No acute findings. Colonic diverticulosis, without radiographic evidence of diverticulitis. Mildly enlarged prostate, with prior TURP defect. Stable small bilateral fat-containing inguinal hernias.   CT Abdomen/pelvis November 17, 2022 CLINICAL DATA:  Acute on chronic abdominal pain. Abdominal distension, known abdominal hernia IMPRESSION: 1. Mid small bowel obstruction with a single point of transition  within the periumbilical region. 2. Mild hepatic steatosis. 3. Mild sigmoid diverticulosis. 4. Moderate  prostatic hypertrophy. 5. Small bilateral fat containing inguinal hernias. 6. Aortic atherosclerosis   CT Abdomen/pelvis Sep 18, 2022 CLINICAL DATA: Abdominal pain  IMPRESSION: 1. No CT evidence of acute abdominal/pelvic process. 2. Hepatic steatosis. 3. Postsurgical changes about the GE junction, likely prior Nissen fundoplication. 4. Colonic diverticulosis without evidence of acute diverticulitis. 5. Enlarged prostate with findings suggestive of prior TURP. 6. Fat containing bilateral inguinal hernias. 7. Levoscoliosis of the spine centered at L2-L3 disc space. Multilevel degenerate disc disease of the lumbar spine. 8. Aortic atherosclerosis   CT Abdomen/pelvis Oct 2023 CLINICAL DATA: Left lower quadrant abdominal pain  IMPRESSION: 1. No acute intra-abdominal or pelvic pathology. 2. Sigmoid diverticulosis. No bowel obstruction. 3. Mild fatty liver. 4.  Aortic Atherosclerosis (ICD10-I70.0)   CT abdomen/pelvis Nov 2017 CLINICAL DATA:  74 y/o M; abdominal pain, nausea, and diarrhea. History of fundoplication, appendectomy, and cholecystectomy.   IMPRESSION: 1. New herniation of a short segment of sigmoid colon into a moderate left inguinal hernia. No proximal obstruction or significant inflammatory changes of the bowel. Stable moderate right inguinal hernia containing fat. 2. Postsurgical changes related to fundoplication, cholecystectomy, and appendectomy. No hiatal hernia. 3. Aortic atherosclerosis     CT Abdomen/pelvis April 2016 CLINICAL DATA: Left lower quadrant abdominal pain for 2 weeks  IMPRESSION: No hydronephrosis or renal obstruction is noted. No renal or ureteral calculi are noted.   Stable mild bilateral fat containing inguinal hernias.   No acute abnormality seen in the abdomen or pelvis.     CT Abdomen/pelvis May 2009 Clinical Data:  Right lower quadrant pain with bloating and bloody  stools.  Diarrhea with epigastric discomfort.  IMPRESSION:     1. No acute findings.  IMPRESSION:    1. Enlarged prostate.  2.  Small bilateral inguinal hernias contain fat.      CT Abdomen/pelvis Feb 2007 Clinical Data: Mid to low abdominal pain with elevated hemoglobin  IMPRESSION:  1. No acute abdominal findings are seen.   2. Prominent pancreas, probably a normal variant. Postoperative changes at the GE junction.  IMPRESSION:  No acute pelvic findings      CT Abdomen/pelvis May 79955 CLINICAL DATA:    ABDOMINAL PAIN.  IMPRESSION  NEGATIVE CT SCAN OF THE ABDOMEN WITH CONTRAST    IMPRESSION  1.  NO EVIDENCE FOR INFLAMMATORY OR MALIGNANT DISEASE WITHIN THE PELVIS.  2.   BILATERAL PARS DEFECTS OF L5.  3.   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   Cataracts, both eyes    worst on left   Colon polyps    Complication of anesthesia    pt had spinal for a knee replacement--woke up during surgery--and sick after surgery   Depression    Diverticulosis    DVT (deep venous thrombosis) (HCC) 2001 or 2002   right leg-required hospitalization x 8 days   DVT (deep venous thrombosis) (HCC) 1996   right   GERD (  gastroesophageal reflux disease) 11/18/2022   Glaucoma    both eyes   H/O hiatal hernia    Hearing impaired person, bilateral    bilateral   Hemorrhoid    History of IBS    History of kidney stones    x 1-passed   Hyperlipidemia 2012   Hypertension    Lumbago    Lumbar post-laminectomy syndrome    Myocardial infarction [facility]) December 2019   PAF (paroxysmal atrial fibrillation) (HCC)    PONV (postoperative nausea and vomiting)     Sleep apnea    pt uses cpap - setting of 16   Ulcer 1966   patient denies   Ulcerative colitis [facility])    patient denies   Wears glasses    Wears hearing aid      Past Surgical History:  Procedure Laterality Date   APPENDECTOMY  1993   BACK SURGERY  2008   bone spur removed    BALLOON DILATION N/A 06/18/2022   Procedure: BALLOON DILATION;  Surgeon: Cindie Carlin POUR, DO;  [facility];  Service: Endoscopy;  Laterality: N/A;   bilateral carpal tunnel release 2009     BIOPSY  06/18/2022   Procedure: BIOPSY;  Surgeon: Cindie Carlin POUR, DO;  [facility];  Service: Endoscopy;;   cervical fusion 2011--pt has good range of motion neck     CHOLECYSTECTOMY  1994   COLONOSCOPY WITH PROPOFOL  N/A 06/18/2022   Procedure: COLONOSCOPY WITH PROPOFOL ;  Surgeon: Cindie Carlin POUR, DO;  [facility];  Service: Endoscopy;  Laterality: N/A;  8:15 AM   ESOPHAGOGASTRODUODENOSCOPY (EGD) WITH PROPOFOL  N/A 06/18/2022   Procedure: ESOPHAGOGASTRODUODENOSCOPY (EGD) WITH PROPOFOL ;  Surgeon: Cindie Carlin POUR, DO;  [facility];  Service: Endoscopy;  Laterality: N/A;   EYE SURGERY N/A    Phreesia 07/13/2020   HERNIA REPAIR  2008   INSERTION OF MESH Bilateral 07/31/2016   Procedure: INSERTION OF MESH;  Surgeon: Donnice Lunger, MD;  Location: [facility];  Service: General;  Laterality: Bilateral;   JOINT REPLACEMENT Bilateral    2001 left total knee and 1985 right total knee   LAPAROSCOPY N/A 11/19/2022   Procedure: LAPAROSCOPY DIAGNOSTIC;  Surgeon: Vernetta Berg, MD;  Location[facility] OR;  Service: General;  Laterality: N/A;   LUMBAR LAMINECTOMY/DECOMPRESSION MICRODISCECTOMY N/A 07/24/2021   Procedure: DECOMPRESSION AND REMOVAL OF CYST LUMBAR FIVE THROUGH SACRAL ONE;  Surgeon: Burnetta Aures, MD;  Location: [facility];  Service: Orthopedics;  Laterality: N/A;   LYSIS OF ADHESION  11/19/2022   Procedure: LAPAROSCOPIC LYSIS OF ADHESION;  Surgeon: Vernetta Berg, MD;   Location: [facility];  Service: General;;   MASS EXCISION Left 01/04/2014   Procedure: LEFT MIDDLE FINGER MASS EXCISION WITH NAILBED REPAIR AND ADVANCEMENT FLAP;  Surgeon: Elsie Mussel, MD;  Location: [facility];  Service: Orthopedics;  Laterality: Left;   mass removed from 2nd finger Left 03/2013   squamous carcinoma   nissen fundoplication 1995     POLYPECTOMY  06/18/2022   Procedure: POLYPECTOMY;  Surgeon: Cindie Carlin POUR, DO;  [facility];  Service: Endoscopy;;   PROSTATE SURGERY  2014   TURP   right wrist fusion 12/12 - pt wearing brace on the wrist-not started physical therapy yet Right    fusion prevent limited ROM to right wrist   SPINE SURGERY  2009   cervical spine fusion   thyroid  nodule     removed   TOTAL KNEE REVISION Right 11/17/2013   Procedure:  RIGHT KNEE POLYETHYLENE REVISION, bone graft;  Surgeon: Dempsey LULLA Moan, MD;  [facility];  Service: Orthopedics;  Laterality: Right;   TOTAL SHOULDER ARTHROPLASTY Right 01/28/2018   Procedure: RIGHT TOTAL SHOULDER ARTHROPLASTY;  Surgeon: Kay Kemps, MD;  Location[facility];  Service: Orthopedics;  Laterality: Right;   TOTAL SHOULDER ARTHROPLASTY Left 07/15/2018   Procedure: LEFT SHOULDER ANATOMIC TOTAL SHOULDER ARTHROPLASTY;  Surgeon: Kay Kemps, MD;  Location[facility] OR;  Service: Orthopedics;  Laterality: Left;   TRANSURETHRAL RESECTION OF PROSTATE  10/09/2011   Procedure: TRANSURETHRAL RESECTION OF THE PROSTATE WITH GYRUS INSTRUMENTS;  Surgeon: Donnice Gwenyth Brooks, MD;  Location: [facility];  Service: Urology;  Laterality: N/A;   uvvvp     XI ROBOTIC ASSISTED INGUINAL HERNIA REPAIR WITH MESH Bilateral 07/31/2016   Procedure: XI ROBOTIC BILATERAL INGUINAL HERNIA REPAIR WITH MESH;  Surgeon: Donnice Lunger, MD;  Location: [facility];  Service: General;  Laterality: Bilateral;   Family History  Problem Relation Age of Onset   Arthritis Mother    Hearing loss Mother    Hyperlipidemia Mother     Hypertension Mother    Deep vein thrombosis Mother    Bone cancer Father        deceased   Cancer Father    Diabetes Father    Prostate cancer Father    Ulcerative colitis Father    Diabetes Sister    Hearing loss Maternal Grandmother    Hearing loss Maternal Grandfather    Diabetes Paternal Grandfather    Social History[1] Current Outpatient Medications  Medication Sig Dispense Refill   acetaminophen  (TYLENOL ) 500 MG tablet Take 500 mg by mouth every 6 (six) hours as needed for mild pain or headache.     albuterol  (VENTOLIN  HFA) 108 (90 Base) MCG/ACT inhaler Inhale 2 puffs into the lungs every 4 (four) hours as needed for wheezing or shortness of breath. 18 g 2   atorvastatin  (LIPITOR) 40 MG tablet TAKE 1 TABLET BY MOUTH DAILY 100 tablet 2   Calcium  Carb-Cholecalciferol (CALCIUM  + D3 PO) Take 1 tablet by mouth at bedtime.     cholestyramine  (QUESTRAN ) 4 g packet Take 1-2 packs daily. 30 each 1   diphenhydrAMINE  (BENADRYL ) 25 mg capsule Take 25 mg by mouth daily as needed for allergies.     fluticasone  (FLONASE ) 50 MCG/ACT nasal spray Place 1 spray into both nostrils at bedtime as needed for allergies.     hydroxypropyl methylcellulose / hypromellose (ISOPTO TEARS / GONIOVISC) 2.5 % ophthalmic solution Place 1 drop into both eyes 3 (three) times daily as needed for dry eyes.     latanoprost  (XALATAN ) 0.005 % ophthalmic solution Place 1 drop into both eyes at bedtime.     meclizine  (ANTIVERT ) 25 MG tablet TAKE 1 TABLET(25 MG) BY MOUTH THREE TIMES DAILY AS NEEDED FOR DIZZINESS 30 tablet 0   metoprolol  tartrate (LOPRESSOR ) 25 MG tablet Take 25 mg by mouth See admin instructions. Take 25 mg by mouth one to three times a day when in A-Fib (call 9-1-1 if no relief after three doses)     Multiple Vitamin (MULTIVITAMIN WITH MINERALS) TABS tablet Take 1 tablet by mouth at bedtime.     OVER THE COUNTER MEDICATION Take 1-2 tablets by mouth See admin instructions. Tylenol  for Tension headaches- Take  1-2 tablets by mouth every 6 hours as needed for headaches     PRESCRIPTION MEDICATION Inhale into the lungs See admin instructions. CPAP- At bedtime     rivaroxaban  (XARELTO ) 20 MG  TABS tablet TAKE 1 TABLET BY MOUTH DAILY  WITH SUPPER 100 tablet 1   sodium chloride  (OCEAN) 0.65 % SOLN nasal spray Place 1 spray into both nostrils as needed for congestion. 88 mL 0   traZODone  (DESYREL ) 50 MG tablet Take 100 mg by mouth at bedtime.     TRINTELLIX  10 MG TABS tablet TAKE ONE TABLET BY MOUTH EVERY NIGHT AT BEDTIME 90 tablet 2   dicyclomine  (BENTYL ) 10 MG capsule Take 1 capsule (10 mg total) by mouth 4 (four) times daily -  before meals and at bedtime. (Patient not taking: Reported on 08/24/2024) 60 capsule 1   esomeprazole  (NEXIUM ) 40 MG capsule Take 1 capsule (40 mg total) by mouth daily at 12 noon. (Patient not taking: Reported on 08/24/2024) 30 capsule 11   No current facility-administered medications for this visit.   Allergies[2]   Review of Systems: All systems reviewed and negative except where noted in HPI.    No results found.  Physical Exam: BP 118/62   Pulse 62   Ht 5' 7 (1.702 m)   Wt 224 lb (101.6 kg)   SpO2 95%   BMI 35.08 kg/m  Constitutional: Pleasant,well-developed, Caucasian male in no acute distress. HEENT: Normocephalic and atraumatic. Conjunctivae are normal. No scleral icterus. Neck supple.  Cardiovascular: Normal rate, regular rhythm.  Pulmonary/chest: Effort normal and breath sounds normal. No wheezing, rales or rhonchi. Abdominal: Soft, nondistended, multifocal tenderness to palpation in the bilateral lower quadrants and periumbilical region without rigidity or guarding. Bowel sounds active throughout. There are no masses palpable. No hepatomegaly. Extremities: no edema Neurological: Alert and oriented to person place and time. Skin: Skin is warm and dry. No rashes noted. Psychiatric: Normal mood and affect. Behavior is normal.  CBC    Component Value  Date/Time   WBC 7.2 07/12/2024 1034   RBC 4.95 07/12/2024 1034   HGB 16.3 07/12/2024 1034   HGB 15.9 08/10/2023 1114   HCT 46.3 07/12/2024 1034   HCT 46.6 08/10/2023 1114   PLT 189.0 07/12/2024 1034   PLT 197 08/10/2023 1114   MCV 93.6 07/12/2024 1034   MCV 96 08/10/2023 1114   MCH 32.6 08/10/2023 1114   MCH 32.1 11/20/2022 0148   MCHC 35.1 07/12/2024 1034   RDW 13.1 07/12/2024 1034   RDW 12.5 08/10/2023 1114   LYMPHSABS 1.8 07/12/2024 1034   LYMPHSABS 1.9 08/10/2023 1114   MONOABS 0.8 07/12/2024 1034   EOSABS 0.2 07/12/2024 1034   EOSABS 0.2 08/10/2023 1114   BASOSABS 0.1 07/12/2024 1034   BASOSABS 0.1 08/10/2023 1114    CMP     Component Value Date/Time   NA 139 07/12/2024 1034   NA 141 08/10/2023 1114   K 4.4 07/12/2024 1034   CL 104 07/12/2024 1034   CO2 28 07/12/2024 1034   GLUCOSE 99 07/12/2024 1034   BUN 16 07/12/2024 1034   BUN 20 08/10/2023 1114   CREATININE 1.08 07/12/2024 1034   CREATININE 1.19 05/21/2022 1518   CALCIUM  9.8 07/12/2024 1034   PROT 7.9 07/12/2024 1034   PROT 7.2 08/10/2023 1114   ALBUMIN 4.9 07/12/2024 1034   ALBUMIN 4.8 08/10/2023 1114   AST 26 07/12/2024 1034   ALT 29 07/12/2024 1034   ALKPHOS 66 07/12/2024 1034   BILITOT 0.6 07/12/2024 1034   BILITOT 0.5 08/10/2023 1114   GFRNONAA >60 11/20/2022 0148   GFRNONAA 66 07/11/2020 0824   GFRAA 76 07/11/2020 0824       Latest Ref Rng & Units  07/12/2024   10:34 AM 08/10/2023   11:14 AM 11/20/2022    1:48 AM  CBC EXTENDED  WBC 4.0 - 10.5 K/uL 7.2  8.0  8.8   RBC 4.22 - 5.81 Mil/uL 4.95  4.88  4.64   Hemoglobin 13.0 - 17.0 g/dL 83.6  84.0  85.0   HCT 39.0 - 52.0 % 46.3  46.6  41.2   Platelets 150.0 - 400.0 K/uL 189.0  197  157   NEUT# 1.4 - 7.7 K/uL 4.3  4.9    Lymph# 0.7 - 4.0 K/uL 1.8  1.9        ASSESSMENT AND PLAN:  74 year old male with long standing problems with abdominal pain and diarrhea, most consistent with IBS.  Had an episode of small bowel obstruction and also with  weakly positive alpha gal serologies which has raised possibilities of alternative diagnoses, but I think IBS is by far the most likely diagnosis. We discussed the proposed pathophysiology of IBS and gut brain axis disorders in general.  We discussed management of IBS, to include use of medications to improve bowel habits, as needed pain medicine, centrally acting neuromodulators, role of empiric dietary modifications to include a low FODMAP diet gluten-free diet, as well as the role of cognitive therapies.  We discussed the goals of IBS management, namely to minimize the impact of GI symptoms on quality of life.   Irritable bowel syndrome with diarrhea Chronic diarrhea and postprandial pain persist despite prior evaluations and treatments. Amitriptyline  recommended for pain, dicyclomine  for intermittent pain, and cholestyramine  for diarrhea control. Complete resolution unlikely; focus on quality of life. - Initiated amitriptyline  10 mg nightly, increase to 20 mg nightly after two weeks if tolerated. - Advised dicyclomine  as needed for pain and spasm. - Recommended cholestyramine  2 grams daily (1/2 packet) for diarrhea control. - Scheduled follow-up in two months to reassess.  Possible alpha-gal syndrome Diagnosis uncertain with weakly positive alpha-gal panel and minimal improvement after meat avoidance. Further dietary restriction needed for clarification. - Advised continued avoidance of mammalian meat for at least two more weeks. - If no improvement after two months, probablyt okay to resume mammalian meat. - Instructed to monitor symptoms upon reintroduction and discontinue if symptoms recur.  Recording duration: 20 minutes     I spent a total of 35 minutes reviewing the patient's medical record, interviewing and examining the patient, discussing his diagnosis and management of his condition going forward, and documenting in the medical record  Jeancarlos Marchena E. Stacia, MD [facility]    Tobie Suzzane POUR, MD     [1]  Social History Tobacco Use   Smoking status: Former    Current packs/day: 0.00    Average packs/day: 1 pack/day for 15.0 years (15.0 ttl pk-yrs)    Types: Cigarettes    Start date: 08/03/1968    Quit date: 08/04/1983    Years since quitting: 41.0   Smokeless tobacco: Never   Tobacco comments:    quit smoking 1985  Vaping Use   Vaping status: Never Used  Substance Use Topics   Alcohol  use: Yes    Alcohol /week: 3.0 standard drinks of alcohol     Types: 3 Cans of beer per week    Comment: 4 beers a week   Drug use: No  [2]  Allergies Allergen Reactions   Latex Rash and Other (See Comments)    CONDOM CATHETER- SEVERE REACTION!!!!!   Adhesive [Tape] Other (See Comments)    Causes blisters - pls use paper  tape   Alpha-Gal Other (See Comments)    Told he has alpha gal by testing   "

## 2024-08-24 NOTE — Patient Instructions (Signed)
 Medication Instructions:  No changes *If you need a refill on your cardiac medications before your next appointment, please call your pharmacy*  Lab Work: none If you have labs (blood work) drawn today and your tests are completely normal, you will receive your results only by: MyChart Message (if you have MyChart) OR A paper copy in the mail If you have any lab test that is abnormal or we need to change your treatment, we will call you to review the results.  Testing/Procedures:  Follow-Up: At [facility], you and your health needs are our priority.  As part of our continuing mission to provide you with exceptional heart care, our providers are all part of one team.  This team includes your primary Cardiologist (physician) and Advanced Practice Providers or APPs (Physician Assistants and Nurse Practitioners) who all work together to provide you with the care you need, when you need it.  Your next appointment:   12 month(s)  Provider:   Shelda Bruckner, MD, Rosaline Bane, NP, or Reche Finder, NP

## 2024-08-26 DIAGNOSIS — K580 Irritable bowel syndrome with diarrhea: Secondary | ICD-10-CM | POA: Insufficient documentation

## 2024-08-29 ENCOUNTER — Telehealth: Payer: Self-pay

## 2024-08-29 ENCOUNTER — Other Ambulatory Visit (HOSPITAL_COMMUNITY): Payer: Self-pay

## 2024-08-29 NOTE — Telephone Encounter (Signed)
 Contacted patient regarding income & assistance options for Xarelto  and Trintellix .   HH size 2 2025 annual income $50,339 FPL 232% Over limit for LIS/ Extra Help  Will attempt assistance with JJPAF for Xarelto  and [employer] for Trintellix .  Xarelto  20mg  30 day copay unknown via test claim. Trintellix  10mg  30 day copay $472 per test claim.   JJPAF income requirements:    Karla income requirements:

## 2024-08-29 NOTE — Telephone Encounter (Signed)
-----   Message from Lang Sieve sent at 08/29/2024 12:03 PM EST ----- Walterine I honestly thought these programs were one in the same (jnj with me and xarelto  with me).   I don't believe that he applied for LIS. His income should be above the LIS threshold from what I remember / should be shown in his SSA statements. I was thinking that he only had to do the denial if his income was below LIS threshold but if there is a difference in programs, please use whichever form he would be more likely to get approved with!  Thank you! ----- Message ----- From: Rosina Lavern SAILOR, CPhT Sent: 08/29/2024  11:07 AM EST To: Lang Sieve, [facility]  Walterine Lang!  Do you know if patient was denied LIS? Asking due to the JJPAF requirements. Most Medicare patients end up having to use the XareltoWithMe program. ----- Message ----- From: Rosina Lavern SAILOR, CPhT Sent: 08/23/2024  11:46 AM EST To: Lavern SAILOR Rosina, CPhT   ----- Message ----- From: Sieve Lang, [facility] Sent: 08/17/2024  10:54 AM EST To: Rx Med Assistance Team  PAP Advocate Options: Please mail [employer] / [employer] Help at Hand program application to patient for Xarelto  20mg  daily and Trintellix  10 mg daily. Prescriber is Suzzane Blanch, MD at [facility] practice.   Thank you.

## 2024-08-30 ENCOUNTER — Other Ambulatory Visit: Payer: Self-pay | Admitting: Cardiology

## 2024-08-30 DIAGNOSIS — I480 Paroxysmal atrial fibrillation: Secondary | ICD-10-CM

## 2024-08-30 NOTE — Telephone Encounter (Signed)
 Xarelto  20mg  refill request received. Pt is 74 years old, weight-101.6kg, Crea-1.08 on 07/12/24, last seen by Dr. Lonni on 08/24/24, Diagnosis-Afib, CrCl- 84.93 mL/min ; Dose is appropriate based on dosing criteria. Will send in refill to requested pharmacy.

## 2024-09-01 ENCOUNTER — Telehealth: Payer: Self-pay

## 2024-09-01 NOTE — Telephone Encounter (Signed)
 New encounters created for PAP.

## 2024-09-01 NOTE — Telephone Encounter (Signed)
 PAP: Patient assistance application for XARELTO  through [employer]) has been mailed to pt's home address on file.

## 2024-09-01 NOTE — Telephone Encounter (Signed)
 PAP: Patient assistance application for TRINTELLIX  through [employer] has been mailed to pt's home address on file.

## 2024-09-11 ENCOUNTER — Encounter: Payer: Medicare Other | Admitting: Internal Medicine

## 2024-10-10 ENCOUNTER — Encounter: Payer: Self-pay | Admitting: Internal Medicine

## 2024-10-23 ENCOUNTER — Ambulatory Visit: Admitting: Gastroenterology
